# Patient Record
Sex: Male | Born: 1940 | Race: Black or African American | Hispanic: No | Marital: Single | Smoking: Former smoker
Health system: Southern US, Community
[De-identification: ages and names within clinical notes are randomized; demographics above are authoritative.]

## PROBLEM LIST (undated history)

## (undated) DIAGNOSIS — I10 Essential (primary) hypertension: Secondary | ICD-10-CM

## (undated) DIAGNOSIS — R64 Cachexia: Secondary | ICD-10-CM

## (undated) DIAGNOSIS — J9601 Acute respiratory failure with hypoxia: Secondary | ICD-10-CM

## (undated) DIAGNOSIS — C801 Malignant (primary) neoplasm, unspecified: Secondary | ICD-10-CM

## (undated) DIAGNOSIS — G809 Cerebral palsy, unspecified: Secondary | ICD-10-CM

## (undated) DIAGNOSIS — I739 Peripheral vascular disease, unspecified: Secondary | ICD-10-CM

## (undated) DIAGNOSIS — E785 Hyperlipidemia, unspecified: Secondary | ICD-10-CM

## (undated) DIAGNOSIS — K56609 Unspecified intestinal obstruction, unspecified as to partial versus complete obstruction: Secondary | ICD-10-CM

## (undated) DIAGNOSIS — J189 Pneumonia, unspecified organism: Secondary | ICD-10-CM

## (undated) DIAGNOSIS — J9600 Acute respiratory failure, unspecified whether with hypoxia or hypercapnia: Secondary | ICD-10-CM

## (undated) HISTORY — DX: Acute respiratory failure, unspecified whether with hypoxia or hypercapnia: J96.00

## (undated) HISTORY — DX: Cachexia: R64

## (undated) HISTORY — PX: PROSTATE SURGERY: SHX751

## (undated) HISTORY — DX: Essential (primary) hypertension: I10

## (undated) HISTORY — DX: Malignant (primary) neoplasm, unspecified: C80.1

## (undated) HISTORY — DX: Peripheral vascular disease, unspecified: I73.9

## (undated) HISTORY — DX: Cerebral palsy, unspecified: G80.9

## (undated) HISTORY — DX: Hyperlipidemia, unspecified: E78.5

---

## 1997-09-23 ENCOUNTER — Encounter: Admission: RE | Admit: 1997-09-23 | Discharge: 1997-09-23 | Payer: Self-pay | Admitting: Family Medicine

## 1997-10-27 ENCOUNTER — Encounter: Admission: RE | Admit: 1997-10-27 | Discharge: 1997-10-27 | Payer: Self-pay | Admitting: Family Medicine

## 1997-11-10 ENCOUNTER — Encounter: Admission: RE | Admit: 1997-11-10 | Discharge: 1997-11-10 | Payer: Self-pay | Admitting: Family Medicine

## 1998-01-21 ENCOUNTER — Encounter: Admission: RE | Admit: 1998-01-21 | Discharge: 1998-01-21 | Payer: Self-pay | Admitting: Family Medicine

## 1998-03-31 ENCOUNTER — Encounter: Admission: RE | Admit: 1998-03-31 | Discharge: 1998-03-31 | Payer: Self-pay | Admitting: Family Medicine

## 1998-05-10 ENCOUNTER — Encounter: Admission: RE | Admit: 1998-05-10 | Discharge: 1998-05-10 | Payer: Self-pay | Admitting: Sports Medicine

## 1998-10-11 ENCOUNTER — Encounter: Admission: RE | Admit: 1998-10-11 | Discharge: 1998-10-11 | Payer: Self-pay | Admitting: Family Medicine

## 1998-11-14 ENCOUNTER — Encounter: Admission: RE | Admit: 1998-11-14 | Discharge: 1998-11-14 | Payer: Self-pay | Admitting: Family Medicine

## 1998-12-16 ENCOUNTER — Encounter: Admission: RE | Admit: 1998-12-16 | Discharge: 1998-12-16 | Payer: Self-pay | Admitting: Family Medicine

## 1998-12-23 ENCOUNTER — Other Ambulatory Visit: Admission: RE | Admit: 1998-12-23 | Discharge: 1998-12-23 | Payer: Self-pay | Admitting: Urology

## 1998-12-30 ENCOUNTER — Encounter: Admission: RE | Admit: 1998-12-30 | Discharge: 1998-12-30 | Payer: Self-pay | Admitting: Family Medicine

## 1999-01-04 ENCOUNTER — Encounter: Admission: RE | Admit: 1999-01-04 | Discharge: 1999-01-04 | Payer: Self-pay | Admitting: Family Medicine

## 1999-03-29 ENCOUNTER — Encounter: Admission: RE | Admit: 1999-03-29 | Discharge: 1999-03-29 | Payer: Self-pay | Admitting: Family Medicine

## 2000-02-26 ENCOUNTER — Encounter: Admission: RE | Admit: 2000-02-26 | Discharge: 2000-02-26 | Payer: Self-pay | Admitting: Family Medicine

## 2000-06-04 ENCOUNTER — Encounter: Admission: RE | Admit: 2000-06-04 | Discharge: 2000-06-04 | Payer: Self-pay | Admitting: Family Medicine

## 2000-07-04 ENCOUNTER — Encounter: Admission: RE | Admit: 2000-07-04 | Discharge: 2000-07-04 | Payer: Self-pay | Admitting: Family Medicine

## 2000-07-29 ENCOUNTER — Encounter: Admission: RE | Admit: 2000-07-29 | Discharge: 2000-07-29 | Payer: Self-pay | Admitting: Family Medicine

## 2000-11-29 ENCOUNTER — Encounter: Admission: RE | Admit: 2000-11-29 | Discharge: 2000-11-29 | Payer: Self-pay | Admitting: Family Medicine

## 2000-12-06 ENCOUNTER — Encounter: Admission: RE | Admit: 2000-12-06 | Discharge: 2000-12-06 | Payer: Self-pay | Admitting: Family Medicine

## 2000-12-25 ENCOUNTER — Encounter: Admission: RE | Admit: 2000-12-25 | Discharge: 2000-12-25 | Payer: Self-pay | Admitting: Family Medicine

## 2001-07-29 ENCOUNTER — Encounter: Admission: RE | Admit: 2001-07-29 | Discharge: 2001-07-29 | Payer: Self-pay | Admitting: Sports Medicine

## 2001-08-26 ENCOUNTER — Encounter: Admission: RE | Admit: 2001-08-26 | Discharge: 2001-08-26 | Payer: Self-pay | Admitting: Sports Medicine

## 2001-08-28 ENCOUNTER — Encounter: Admission: RE | Admit: 2001-08-28 | Discharge: 2001-08-28 | Payer: Self-pay | Admitting: Family Medicine

## 2001-09-05 ENCOUNTER — Encounter: Admission: RE | Admit: 2001-09-05 | Discharge: 2001-09-05 | Payer: Self-pay | Admitting: Family Medicine

## 2002-01-13 ENCOUNTER — Encounter: Admission: RE | Admit: 2002-01-13 | Discharge: 2002-01-13 | Payer: Self-pay | Admitting: Family Medicine

## 2002-01-16 ENCOUNTER — Encounter: Admission: RE | Admit: 2002-01-16 | Discharge: 2002-01-16 | Payer: Self-pay | Admitting: Family Medicine

## 2002-04-20 ENCOUNTER — Encounter: Admission: RE | Admit: 2002-04-20 | Discharge: 2002-04-20 | Payer: Self-pay | Admitting: Family Medicine

## 2002-06-30 ENCOUNTER — Encounter: Admission: RE | Admit: 2002-06-30 | Discharge: 2002-06-30 | Payer: Self-pay | Admitting: Family Medicine

## 2002-07-31 ENCOUNTER — Encounter: Admission: RE | Admit: 2002-07-31 | Discharge: 2002-07-31 | Payer: Self-pay | Admitting: Family Medicine

## 2002-12-10 ENCOUNTER — Encounter: Admission: RE | Admit: 2002-12-10 | Discharge: 2002-12-10 | Payer: Self-pay | Admitting: Family Medicine

## 2003-01-29 ENCOUNTER — Encounter: Admission: RE | Admit: 2003-01-29 | Discharge: 2003-01-29 | Payer: Self-pay | Admitting: Family Medicine

## 2003-04-12 ENCOUNTER — Encounter: Admission: RE | Admit: 2003-04-12 | Discharge: 2003-04-12 | Payer: Self-pay | Admitting: Family Medicine

## 2003-09-02 ENCOUNTER — Ambulatory Visit (HOSPITAL_COMMUNITY): Admission: RE | Admit: 2003-09-02 | Discharge: 2003-09-02 | Payer: Self-pay | Admitting: Gastroenterology

## 2003-12-16 ENCOUNTER — Ambulatory Visit: Payer: Self-pay | Admitting: Sports Medicine

## 2004-01-17 ENCOUNTER — Ambulatory Visit: Payer: Self-pay | Admitting: Family Medicine

## 2004-05-25 ENCOUNTER — Ambulatory Visit: Payer: Self-pay | Admitting: Sports Medicine

## 2004-06-08 ENCOUNTER — Ambulatory Visit: Payer: Self-pay | Admitting: Sports Medicine

## 2004-06-19 ENCOUNTER — Encounter: Admission: RE | Admit: 2004-06-19 | Discharge: 2004-09-17 | Payer: Self-pay | Admitting: Family Medicine

## 2004-07-14 ENCOUNTER — Ambulatory Visit: Payer: Self-pay | Admitting: Family Medicine

## 2004-07-27 ENCOUNTER — Ambulatory Visit: Payer: Self-pay | Admitting: Family Medicine

## 2004-08-11 ENCOUNTER — Ambulatory Visit: Payer: Self-pay | Admitting: Family Medicine

## 2004-08-24 ENCOUNTER — Ambulatory Visit: Payer: Self-pay | Admitting: Sports Medicine

## 2004-09-06 ENCOUNTER — Ambulatory Visit: Payer: Self-pay | Admitting: Family Medicine

## 2004-10-11 ENCOUNTER — Ambulatory Visit: Payer: Self-pay | Admitting: Family Medicine

## 2004-11-09 ENCOUNTER — Encounter: Admission: RE | Admit: 2004-11-09 | Discharge: 2004-12-10 | Payer: Self-pay | Admitting: Family Medicine

## 2004-12-06 ENCOUNTER — Ambulatory Visit: Payer: Self-pay | Admitting: Family Medicine

## 2004-12-21 ENCOUNTER — Ambulatory Visit: Payer: Self-pay | Admitting: Family Medicine

## 2005-01-12 ENCOUNTER — Ambulatory Visit: Payer: Self-pay | Admitting: Family Medicine

## 2005-01-18 ENCOUNTER — Ambulatory Visit: Payer: Self-pay | Admitting: Family Medicine

## 2005-03-23 ENCOUNTER — Ambulatory Visit: Payer: Self-pay | Admitting: Family Medicine

## 2005-04-06 ENCOUNTER — Ambulatory Visit: Payer: Self-pay | Admitting: Family Medicine

## 2005-04-18 ENCOUNTER — Ambulatory Visit: Payer: Self-pay | Admitting: Family Medicine

## 2005-06-28 ENCOUNTER — Ambulatory Visit: Payer: Self-pay | Admitting: Family Medicine

## 2005-07-12 ENCOUNTER — Ambulatory Visit: Payer: Self-pay | Admitting: Family Medicine

## 2005-07-31 ENCOUNTER — Ambulatory Visit: Payer: Self-pay | Admitting: Sports Medicine

## 2005-10-02 ENCOUNTER — Ambulatory Visit: Payer: Self-pay | Admitting: Family Medicine

## 2005-10-04 ENCOUNTER — Ambulatory Visit: Admission: RE | Admit: 2005-10-04 | Discharge: 2005-10-04 | Payer: Self-pay | Admitting: Vascular Surgery

## 2005-10-04 ENCOUNTER — Encounter: Payer: Self-pay | Admitting: Vascular Surgery

## 2005-10-18 ENCOUNTER — Ambulatory Visit: Payer: Self-pay | Admitting: Family Medicine

## 2005-11-28 ENCOUNTER — Ambulatory Visit: Payer: Self-pay | Admitting: Sports Medicine

## 2006-01-28 ENCOUNTER — Ambulatory Visit: Payer: Self-pay | Admitting: Sports Medicine

## 2006-05-16 DIAGNOSIS — I152 Hypertension secondary to endocrine disorders: Secondary | ICD-10-CM | POA: Insufficient documentation

## 2006-05-16 DIAGNOSIS — E739 Lactose intolerance, unspecified: Secondary | ICD-10-CM

## 2006-05-16 DIAGNOSIS — N400 Enlarged prostate without lower urinary tract symptoms: Secondary | ICD-10-CM | POA: Insufficient documentation

## 2006-05-16 DIAGNOSIS — E1169 Type 2 diabetes mellitus with other specified complication: Secondary | ICD-10-CM | POA: Insufficient documentation

## 2006-05-16 DIAGNOSIS — E7800 Pure hypercholesterolemia, unspecified: Secondary | ICD-10-CM

## 2006-05-16 DIAGNOSIS — E876 Hypokalemia: Secondary | ICD-10-CM | POA: Insufficient documentation

## 2006-05-16 DIAGNOSIS — G809 Cerebral palsy, unspecified: Secondary | ICD-10-CM | POA: Insufficient documentation

## 2006-05-16 DIAGNOSIS — I10 Essential (primary) hypertension: Secondary | ICD-10-CM | POA: Insufficient documentation

## 2006-07-08 ENCOUNTER — Encounter: Payer: Self-pay | Admitting: Family Medicine

## 2006-07-08 ENCOUNTER — Ambulatory Visit: Payer: Self-pay | Admitting: Sports Medicine

## 2006-07-08 LAB — CONVERTED CEMR LAB
BUN: 17 mg/dL (ref 6–23)
Calcium: 10.4 mg/dL (ref 8.4–10.5)
Creatinine, Ser: 1.21 mg/dL (ref 0.40–1.50)

## 2007-01-02 ENCOUNTER — Encounter (INDEPENDENT_AMBULATORY_CARE_PROVIDER_SITE_OTHER): Payer: Self-pay | Admitting: Family Medicine

## 2007-01-02 ENCOUNTER — Ambulatory Visit: Payer: Self-pay | Admitting: Family Medicine

## 2007-01-02 DIAGNOSIS — R609 Edema, unspecified: Secondary | ICD-10-CM

## 2007-01-06 LAB — CONVERTED CEMR LAB
ALT: 32 units/L (ref 0–53)
AST: 25 units/L (ref 0–37)
Albumin: 4.4 g/dL (ref 3.5–5.2)
Alkaline Phosphatase: 44 units/L (ref 39–117)
BUN: 16 mg/dL (ref 6–23)
Calcium: 9.6 mg/dL (ref 8.4–10.5)
Chloride: 105 meq/L (ref 96–112)
HDL: 51 mg/dL (ref >39)
LDL Cholesterol: 76 mg/dL (ref 0–99)
PSA: 5.34 ng/mL — ABNORMAL HIGH (ref 0.10–4.00)
Potassium: 3.7 meq/L (ref 3.5–5.3)
Sodium: 141 meq/L (ref 135–145)
Total Protein: 7.3 g/dL (ref 6.0–8.3)

## 2007-01-10 ENCOUNTER — Encounter (INDEPENDENT_AMBULATORY_CARE_PROVIDER_SITE_OTHER): Payer: Self-pay | Admitting: Family Medicine

## 2007-01-15 ENCOUNTER — Telehealth (INDEPENDENT_AMBULATORY_CARE_PROVIDER_SITE_OTHER): Payer: Self-pay | Admitting: Family Medicine

## 2007-03-20 HISTORY — PX: PROSTATE SURGERY: SHX751

## 2007-05-02 ENCOUNTER — Ambulatory Visit: Payer: Self-pay | Admitting: Family Medicine

## 2007-05-02 DIAGNOSIS — D170 Benign lipomatous neoplasm of skin and subcutaneous tissue of head, face and neck: Secondary | ICD-10-CM

## 2007-07-24 LAB — CONVERTED CEMR LAB: PSA: 6.82 ng/mL

## 2007-07-29 ENCOUNTER — Encounter (INDEPENDENT_AMBULATORY_CARE_PROVIDER_SITE_OTHER): Payer: Self-pay | Admitting: Family Medicine

## 2007-08-28 ENCOUNTER — Encounter (INDEPENDENT_AMBULATORY_CARE_PROVIDER_SITE_OTHER): Payer: Self-pay | Admitting: Family Medicine

## 2007-09-16 ENCOUNTER — Ambulatory Visit: Admission: RE | Admit: 2007-09-16 | Discharge: 2007-12-15 | Payer: Self-pay | Admitting: Radiation Oncology

## 2007-09-17 ENCOUNTER — Encounter (INDEPENDENT_AMBULATORY_CARE_PROVIDER_SITE_OTHER): Payer: Self-pay | Admitting: Family Medicine

## 2007-09-18 DIAGNOSIS — C61 Malignant neoplasm of prostate: Secondary | ICD-10-CM | POA: Insufficient documentation

## 2007-09-18 HISTORY — DX: Malignant neoplasm of prostate: C61

## 2007-09-24 ENCOUNTER — Encounter: Admission: RE | Admit: 2007-09-24 | Discharge: 2007-09-24 | Payer: Self-pay | Admitting: Urology

## 2007-11-18 ENCOUNTER — Telehealth: Payer: Self-pay | Admitting: *Deleted

## 2007-11-20 ENCOUNTER — Ambulatory Visit (HOSPITAL_BASED_OUTPATIENT_CLINIC_OR_DEPARTMENT_OTHER): Admission: RE | Admit: 2007-11-20 | Discharge: 2007-11-20 | Payer: Self-pay | Admitting: Urology

## 2007-12-10 ENCOUNTER — Encounter (INDEPENDENT_AMBULATORY_CARE_PROVIDER_SITE_OTHER): Payer: Self-pay | Admitting: Family Medicine

## 2007-12-11 ENCOUNTER — Encounter (INDEPENDENT_AMBULATORY_CARE_PROVIDER_SITE_OTHER): Payer: Self-pay | Admitting: Family Medicine

## 2008-01-15 ENCOUNTER — Encounter (INDEPENDENT_AMBULATORY_CARE_PROVIDER_SITE_OTHER): Payer: Self-pay | Admitting: Family Medicine

## 2008-01-15 ENCOUNTER — Ambulatory Visit: Admission: RE | Admit: 2008-01-15 | Discharge: 2008-02-11 | Payer: Self-pay | Admitting: Radiation Oncology

## 2008-01-22 ENCOUNTER — Telehealth: Payer: Self-pay | Admitting: *Deleted

## 2008-02-17 ENCOUNTER — Encounter (INDEPENDENT_AMBULATORY_CARE_PROVIDER_SITE_OTHER): Payer: Self-pay | Admitting: Family Medicine

## 2008-02-17 ENCOUNTER — Ambulatory Visit: Payer: Self-pay

## 2008-02-17 LAB — CONVERTED CEMR LAB
ALT: 28 units/L (ref 0–53)
AST: 26 units/L (ref 0–37)
Albumin: 4.3 g/dL (ref 3.5–5.2)
Alkaline Phosphatase: 59 units/L (ref 39–117)
BUN: 11 mg/dL (ref 6–23)
HDL: 55 mg/dL (ref >39)
LDL Cholesterol: 106 mg/dL — ABNORMAL HIGH (ref 0–99)
Potassium: 3.4 meq/L — ABNORMAL LOW (ref 3.5–5.3)
Total CHOL/HDL Ratio: 3.4

## 2008-02-18 ENCOUNTER — Encounter (INDEPENDENT_AMBULATORY_CARE_PROVIDER_SITE_OTHER): Payer: Self-pay | Admitting: Family Medicine

## 2008-03-22 ENCOUNTER — Ambulatory Visit: Payer: Self-pay | Admitting: Family Medicine

## 2008-04-07 ENCOUNTER — Ambulatory Visit: Payer: Self-pay | Admitting: Family Medicine

## 2008-04-30 ENCOUNTER — Ambulatory Visit: Payer: Self-pay | Admitting: Family Medicine

## 2008-07-20 ENCOUNTER — Telehealth (INDEPENDENT_AMBULATORY_CARE_PROVIDER_SITE_OTHER): Payer: Self-pay | Admitting: Family Medicine

## 2008-08-06 ENCOUNTER — Ambulatory Visit: Payer: Self-pay | Admitting: Family Medicine

## 2008-08-23 ENCOUNTER — Ambulatory Visit: Payer: Self-pay | Admitting: Family Medicine

## 2008-09-13 ENCOUNTER — Encounter: Payer: Self-pay | Admitting: Family Medicine

## 2008-09-24 ENCOUNTER — Encounter: Payer: Self-pay | Admitting: Family Medicine

## 2008-11-26 ENCOUNTER — Ambulatory Visit: Payer: Self-pay | Admitting: Family Medicine

## 2008-12-13 ENCOUNTER — Ambulatory Visit: Payer: Self-pay | Admitting: Family Medicine

## 2009-01-31 ENCOUNTER — Ambulatory Visit: Payer: Self-pay | Admitting: Family Medicine

## 2009-05-09 ENCOUNTER — Ambulatory Visit: Payer: Self-pay | Admitting: Family Medicine

## 2009-05-09 ENCOUNTER — Encounter: Payer: Self-pay | Admitting: Family Medicine

## 2009-05-09 LAB — CONVERTED CEMR LAB
Calcium: 9.3 mg/dL (ref 8.4–10.5)
Potassium: 4 meq/L (ref 3.5–5.3)
Sodium: 139 meq/L (ref 135–145)

## 2009-05-24 ENCOUNTER — Encounter: Payer: Self-pay | Admitting: Family Medicine

## 2009-08-17 ENCOUNTER — Encounter: Payer: Self-pay | Admitting: Family Medicine

## 2009-08-17 ENCOUNTER — Ambulatory Visit: Payer: Self-pay | Admitting: Family Medicine

## 2010-01-27 ENCOUNTER — Ambulatory Visit: Payer: Self-pay | Admitting: Family Medicine

## 2010-04-11 ENCOUNTER — Ambulatory Visit: Admission: RE | Admit: 2010-04-11 | Discharge: 2010-04-11 | Payer: Self-pay | Source: Home / Self Care

## 2010-04-11 DIAGNOSIS — M2020 Hallux rigidus, unspecified foot: Secondary | ICD-10-CM | POA: Insufficient documentation

## 2010-04-11 DIAGNOSIS — H6120 Impacted cerumen, unspecified ear: Secondary | ICD-10-CM | POA: Insufficient documentation

## 2010-04-18 NOTE — Assessment & Plan Note (Signed)
Summary: f/up,tcb   Vital Signs:  Patient profile:   70 year old male Weight:      156.4 pounds Temp:     98.9 degrees F oral Pulse rate:   86 / minute BP sitting:   151 / 78  (left arm) Cuff size:   regular  Vitals Entered By: Garen Grams LPN (May 09, 2009 10:57 AM) CC: f/u BP, CP, and Lipoma Is Patient Diabetic? No Pain Assessment Patient in pain? no        Primary Care Provider:  Cyndia Bent MD  CC:  f/u BP, CP, and and Lipoma.  History of Present Illness: 1. Hypertension:  Pt is taking and tolerating his medicines as prescribed.  He doesn't check his blood pressure at home regularly.        ROS: denies any chest pain, shortness of breath  2. Cerebral Palsy:  hasn't been able to go back to PT because of his blood pressure.  His mobility is decreasing and he isn't able to get around as well as he used to.      ROS: denies any new focal numbness / weakness  3. Lipoma:  Pt has had a facial lipoma for years.  It has been getting bigger and is now more bothersome to the patient.  They tried draining it before and were unable to get anything out.  He has refused surgery before but thinks that it may be time now.      ROS: denies any fevers, redness, injury to the area.  Habits & Providers  Alcohol-Tobacco-Diet     Tobacco Status: never  Current Medications (verified): 1)  Norvasc 10 Mg  Tabs (Amlodipine Besylate) .... Take One Tablet By Mouth Daily 2)  Klor-Con M20 20 Meq  Tbcr (Potassium Chloride Crys Cr) .... Take 2 Tablets Daily 3)  Zocor 80 Mg  Tabs (Simvastatin) .... Take One Tablet By Mouth Daily For Cholesterol. 4)  Aspirin 81 Mg Tbec (Aspirin) .... Take One Tablet Daily 5)  Metoprolol Tartrate 100 Mg Tabs (Metoprolol Tartrate) .... Take Two Tablets Twice A Day For Blood Pressure 6)  Hydrochlorothiazide 25 Mg Tabs (Hydrochlorothiazide) .... Take One Tablet Daily 7)  Lisinopril 10 Mg Tabs (Lisinopril) .... Take 2 Tabs By Mouth Daily 8)  Wheelchair  Misc  (Misc. Devices) .... Wheelchair Use As Needed  Allergies: No Known Drug Allergies  Past History:  Past Medical History: Reviewed history from 12/13/2008 and no changes required. HTN Cerebral Palsy Hx. 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 No tobacco X 6 yrs, occ ETOH ( < 1 drinks/day).  Single.; Resistant to totally quitting alcohol.  Lives by himself.  Sisters Eulogio Ditch and Mical Kicklighter are involved family members [phone])Smoking Status:  never  Physical Exam  General:  vitals reviewed alert and well-hydrated.   Head:  Large lipoma (5x5cm) above the right eye brow.  It is firm and does not appear to have any fluid in it. Eyes:  vision grossly intact, pupils equal, pupils round, and pupils reactive to light.  fundi grossly normal Ears:  R ear normal and L ear normal.   Neck:  no JVD Lungs:  Normal respiratory effort, no wheezes or crackles Heart:  normal rate, regular rhythm, and no murmur.   Abdomen:  Bowel sounds positive,abdomen soft and non-tender without masses, organomegaly or hernias noted. Msk:  right arm  contracted and held in position.  Difficulty with ambulation.  Walks with a cane. Extremities:  no lower extremity edema Neurologic:  walks with a cane.  Speech difficult to understand. Psych:  normally interactive and good eye contact.     Impression & Recommendations:  Problem # 1:  HYPERTENSION, BENIGN SYSTEMIC (ICD-401.1) Assessment Improved Will increase Lisinopril. Check BMET today. His updated medication list for this problem includes:    Norvasc 10 Mg Tabs (Amlodipine besylate) .[name]... Take one tablet by mouth daily    Metoprolol Tartrate 100 Mg Tabs (Metoprolol tartrate) .[name]... Take two tablets twice a day for blood pressure    Hydrochlorothiazide 25 Mg Tabs (Hydrochlorothiazide) .[name]... Take one tablet daily     Lisinopril 10 Mg Tabs (Lisinopril) .[name]... 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  Unable to ambulate as well.  Has been falling.  Will write prescription for a wheel chair.  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 Orders: Dermatology Referral (Derma) [facility]- Est  Level 4 ([address])  Complete Medication List: 1)  Norvasc 10 Mg Tabs (Amlodipine besylate) .... Take one tablet by mouth daily 2)  Klor-con M20 20 Meq Tbcr (Potassium chloride crys cr) .... Take 2 tablets daily 3)  Zocor 80 Mg Tabs (Simvastatin) .... Take one tablet by mouth daily for cholesterol. 4)  Aspirin 81 Mg Tbec (Aspirin) .... Take one tablet daily 5)  Metoprolol Tartrate 100 Mg Tabs (Metoprolol tartrate) .... Take two tablets twice a day for blood pressure 6)  Hydrochlorothiazide 25 Mg Tabs (Hydrochlorothiazide) .... Take one tablet daily 7)  Lisinopril 10 Mg Tabs (Lisinopril) .... Take 2 tabs by mouth daily 8)  Wheelchair Misc (Misc. devices) .... Wheelchair use as needed  Patient Instructions: 1)  For your blood pressure start taking two of the Lisinopril tabs a day for a total of 20mg . 2)  I will send in a prescription for a wheel chair, let me know if you have any problems getting it. 3)  I have put in a referral to a Dermatologic Surgeon for them to take a look at the Lipoma and try and remove it. 4)  Please schedule a follow up appoinment in 8 weeks Prescriptions: WHEELCHAIR  MISC (MISC. DEVICES) Wheelchair use as needed  #1 x 0   Entered and Authorized by:   Angelena Sole MD   Signed by:   Angelena Sole MD on 05/09/2009   Method used:   Electronically to        [facility] (retail)       [address]       [place], [place]  [address]       Ph:  [phone]       Fax: [phone]   RxID:   [id number] LISINOPRIL 10 MG TABS (LISINOPRIL) Take 2 tabs by mouth daily  #60 x 6   Entered and Authorized by:   Angelena Sole MD   Signed by:   Angelena Sole MD on 05/09/2009   Method used:   Electronically to        [facility] (retail)       [address]       [place]  [place], [place]  [address]       Ph: [phone]       Fax: [phone]   RxID:   [id number]   Prevention & Chronic Care Immunizations   Influenza vaccine: Fluvax 3+  (01/02/2007)   Influenza vaccine due: 01/02/2008    Tetanus booster: 10/18/1998: Done.   Tetanus booster due: 10/17/2008    Pneumococcal vaccine: Done.  (10/17/1997)   Pneumococcal vaccine due: None    H. zoster vaccine: Not documented  Colorectal Screening   Hemoccult: Done.  (06/22/2004)   Hemoccult due: Not Indicated    Colonoscopy: normal  (09/24/2008)   Colonoscopy due: 09/24/2013  Other Screening   PSA: 6.82 (then prostate cancer treatment)  (07/24/2007)   PSA due due: 07/23/2008   Smoking status: never  (05/09/2009)  Lipids   Total Cholesterol: 188  (02/17/2008)   LDL: 106  (02/17/2008)   LDL Direct: Not documented   HDL: 55  (02/17/2008)   Triglycerides: 136  (02/17/2008)    SGOT (AST): 26  (02/17/2008)   SGPT (ALT): 28  (02/17/2008)   Alkaline phosphatase: 59  (02/17/2008)   Total bilirubin: 1.5  (02/17/2008)  Hypertension   Last Blood Pressure: 151 / 78  (05/09/2009)   Serum creatinine: 0.85  (02/17/2008)   Serum potassium 3.4  (02/17/2008)    Hypertension flowsheet reviewed?: Yes   Progress toward BP goal: Improved  Self-Management Support :   Personal Goals (by the next clinic visit) :      Personal blood pressure goal: 140/90  (11/26/2008)     Personal LDL goal: 130  (11/26/2008)    Hypertension self-management support: Not documented    Lipid self-management support: Not documented

## 2010-04-18 NOTE — Assessment & Plan Note (Signed)
Summary: f/u visit/[facility](resch'd from 11/3)[facility]   Vital Signs:  Patient profile:   70 year old male Weight:      158.4 pounds Temp:     98.8 degrees F oral Pulse rate:   91 / minute BP sitting:   154 / 93  (left arm) Cuff size:   regular  Vitals Entered By: Garen Grams LPN (January 27, 2010 2:01 PM) CC: f/u BP Is Patient Diabetic? No Pain Assessment Patient in pain? no        Primary Care Provider:  Cyndia Bent MD  CC:  f/u BP.  History of Present Illness: -Rick Schwartz is a pleasant 70 yo AA male with hx significant for cerebral palsy, htn, dyslipidemia and facial lipoma.    1. HTN: He returned to clinic today with one of his sisters who helps care for him.  Currently his bp is still not at goal, 154/93 today.  Pt reports taking all bp meds.  He doesn't check his blood pressure at home regularly.  2. Lipoma:  Was seen by [facility]. forehead lipoma is stable in size.  Still not interested in surgery.  3. Hyperlipidemia:  He has not been taking his 'cholesterol medicine' for the past 2 weeks.  This is not because of noncompliance but due to miscommunication between his sisters who look after him.   4. Hypokalemia:  Not taking his K+ because he ran out.  ROS: Denies chest pain, shortness of breath.  Endorses some lower extremity swelling.    Habits & Providers  Alcohol-Tobacco-Diet     Tobacco Status: never  Current Medications (verified): 1)  Norvasc 10 Mg  Tabs (Amlodipine Besylate) .... Take One Tablet By Mouth Daily 2)  Klor-Con M20 20 Meq  Tbcr (Potassium Chloride Crys Cr) .... Take 2 Tablets Daily 3)  Zocor 80 Mg  Tabs (Simvastatin) .... Take One Tablet By Mouth Daily For Cholesterol. 4)  Aspirin 81 Mg Tbec (Aspirin) .... Take One Tablet Daily 5)  Metoprolol Tartrate 100 Mg Tabs (Metoprolol Tartrate) .... Take Two Tablets Twice A Day For Blood Pressure 6)  Hydrochlorothiazide 25 Mg Tabs (Hydrochlorothiazide) .... Take One Tablet Daily 7)  Lisinopril  20 Mg Tabs (Lisinopril) .... Take 1 Tab By Mouth Daily 8)  Wheelchair  Misc (Misc. Devices) .... Wheelchair Use As Needed  Allergies: No Known Drug Allergies  Social History: Reviewed history from 11/26/2008 and no changes required. No tobacco X 6 yrs, occ ETOH ( < 1 drinks/day).  Single.; Resistant to totally quitting alcohol.  Lives by himself.  Sisters Rick Schwartz and Rick Schwartz are involved family members [phone])  Physical Exam  General:  vitals reviewed, no acute distress, alert and well-hydrated.   Eyes:  vision grossly intact, pupils equal, pupils round, and pupils reactive to light.  fundi grossly normal Neck:  no JVD Lungs:  Normal respiratory effort, no wheezes or crackles Heart:  normal rate, regular rhythm, and no murmur.   Extremities:  1+ LE swelling L>R Neurologic:  walks with a cane.  Speech difficult to understand. Skin:  turgor normal, color normal, and no rashes.   Psych:  not anxious appearing and not depressed appearing.     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  Pt not interested in increasing medicines at this point.  Will consider increasing dose to 40mg  at next visit if still elevated.  Pt may also benefit from a diurectic because of the LE swelling.  His updated medication list for this problem includes:    Norvasc 10 Mg Tabs (Amlodipine besylate) .[name]... Take one tablet by mouth daily    Metoprolol Tartrate 100 Mg Tabs (Metoprolol tartrate) .[name]... Take two tablets twice a day for blood pressure    Hydrochlorothiazide 25 Mg Tabs (Hydrochlorothiazide) .[name]... Take one tablet daily    Lisinopril 20 Mg Tabs (Lisinopril) .[name]... Take 1 tab by mouth daily  Orders: [facility]- Est  Level 4 ([address])  Problem # 2:  HYPERCHOLESTEROLEMIA (ICD-272.0) Assessment: Unchanged  Encouraged to fill med and take as prescribed.  No changes.  His updated medication list for this problem  includes:    Zocor 80 Mg Tabs (Simvastatin) .[name]... Take one tablet by mouth daily for cholesterol.  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  Orders: [facility]- Est  Level 4 ([address])  Problem # 4:  HYPOKALEMIA (ICD-276.8) Assessment: Unchanged  Stable at last check.  Will recheck at next visit.  Orders: [facility]- Est  Level 4 ([address])  Complete Medication List: 1)  Norvasc 10 Mg Tabs (Amlodipine besylate) .... Take one tablet by mouth daily 2)  Klor-con M20 20 Meq Tbcr (Potassium chloride crys cr) .... Take 2 tablets daily 3)  Zocor 80 Mg Tabs (Simvastatin) .... Take one tablet by mouth daily for cholesterol. 4)  Aspirin 81 Mg Tbec (Aspirin) .... Take one tablet daily 5)  Metoprolol Tartrate 100 Mg Tabs (Metoprolol tartrate) .... Take two tablets twice a day for blood pressure 6)  Hydrochlorothiazide 25 Mg Tabs (Hydrochlorothiazide) .... Take one tablet daily 7)  Lisinopril 20 Mg Tabs (Lisinopril) .... Take 1 tab by mouth daily 8)  Wheelchair Misc (Misc. devices) .... Wheelchair use as needed  Patient Instructions: 1)  1. It was nice to see you today. 2)  2. Today you received a flu shot. 3)  3. We will follow up on your blood pressure in 3 months.  If if has not improved will consider increasing your medication and / or adding a fluid pill.  This may help with your leg swelling, as well. 4)  4. Remember to fill your Simvastatin and Potassium medications and take as directed. 5)  5. Make an appointment for 3 months.   Orders Added: 1)  [facility]- Est  Level 4 [[address]]  Appended Document: f/u visit/bmc(resch'd from 11/3)[facility]   Influenza Vaccine    Vaccine Type: Fluvax 3+    Site: left deltoid    Mfr: GlaxoSmithKline    Dose: 0.5 ml    Route: IM    Given by: Garen Grams LPN    Exp. Date: 09/13/2010    Lot #: [id number]    VIS given: 10/11/09 version given January 27, 2010.  Flu Vaccine Consent  Questions    Do you have a history of severe allergic reactions to this vaccine? no    Any prior history of allergic reactions to egg and/or gelatin? no    Do you have a sensitivity to the preservative Thimersol? no    Do you have a past history of Guillan-Barre Syndrome? no    Do you currently have an acute febrile illness? no    Have you ever had a severe reaction to latex? no    Vaccine information given and explained to patient? yes

## 2010-04-18 NOTE — Letter (Signed)
Summary: Generic Letter  [name]  [address]   [place], [place] [address]   Phone: [phone]  Fax: [phone]    08/17/2009  CHARLOTTE BRAFFORD [address] [place], [place]  [address]  To Whom it May Concern,  Mr. Vrishank Moster is a patient of mine.  He has cerebral palsy and has limited mobility and use of his extremities.  He has been getting Home Health Services 2-3 hours per day to help with activities of daily living.  I believe that he will need to continue to receive these services for 2-3 hours per day.  If you have any questions please call the office.  Sincerely,   Angelena Sole MD

## 2010-04-18 NOTE — Consult Note (Signed)
Summary: [facility]  [facility]   Imported By: Bradly Bienenstock 05/30/2009 16:31:12  _____________________________________________________________________  External Attachment:    Type:   Image     Comment:   External Document

## 2010-04-18 NOTE — Assessment & Plan Note (Signed)
Summary: f/u,df   Vital Signs:  Patient profile:   70 year old male Weight:      145.3 pounds Temp:     98.1 degrees F oral Pulse rate:   78 / minute BP sitting:   166 / 89  (left arm) Cuff size:   regular  Vitals Entered By: Garen Grams LPN (August 17, 1608 10:51 AM) CC: f/u HTN, cerebral palsy, lipoma Is Patient Diabetic? No Pain Assessment Patient in pain? no        Primary Care Provider:  Cyndia Bent MD  CC:  f/u HTN, cerebral palsy, and lipoma.  History of Present Illness: 1. HTN:  Pt is usually taking the medicines that he has been prescribed.  He has been out of the Lisinopril for about 1 week and didn't tell anyone to refill it.  He has been taking the Metoprolol and HCTZ.  He doesn't check his blod pressure at home regularly.      ROS: denies chest pain, shortness of breath, vision changes, or headache  2. Cerebral palsy:  Unchanged.  They did go get a wheelchair but haven't been using it.  He is still mobile with a cane.  He needs Home Health Services to help with ADLs.  3. Lipoma:  seen by Dermatologist who was concerned that there was a blood vessel growing in the lipoma and that the surgery would be complicated and be high risk and that it would only be a cosmetic procedure.      ROS: denies pain, fevers, discharge, or bleeding  Habits & Providers  Alcohol-Tobacco-Diet     Tobacco Status: never  Current Medications (verified): 1)  Norvasc 10 Mg  Tabs (Amlodipine Besylate) .... Take One Tablet By Mouth Daily 2)  Klor-Con M20 20 Meq  Tbcr (Potassium Chloride Crys Cr) .... Take 2 Tablets Daily 3)  Zocor 80 Mg  Tabs (Simvastatin) .... Take One Tablet By Mouth Daily For Cholesterol. 4)  Aspirin 81 Mg Tbec (Aspirin) .... Take One Tablet Daily 5)  Metoprolol Tartrate 100 Mg Tabs (Metoprolol Tartrate) .... Take Two Tablets Twice A Day For Blood Pressure 6)  Hydrochlorothiazide 25 Mg Tabs (Hydrochlorothiazide) .... Take One Tablet Daily 7)  Lisinopril 20 Mg Tabs  (Lisinopril) .... Take 1 Tab By Mouth Daily 8)  Wheelchair  Misc (Misc. Devices) .... Wheelchair Use As Needed  Allergies: No Known Drug Allergies  Past History:  Past Medical History: Reviewed history from 12/13/2008 and no changes required. HTN Cerebral Palsy Hx. 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 No tobacco X 6 yrs, occ ETOH ( < 1 drinks/day).  Single.; Resistant to totally quitting alcohol.  Lives by himself.  Sisters Eulogio Ditch and Faheem Ziemann are involved family members [phone])  Physical Exam  General:  vitals reviewed, no acute distress, alert and well-hydrated.   Neck:  no JVD Lungs:  Normal respiratory effort, no wheezes or crackles Heart:  normal rate, regular rhythm, and no murmur.   Abdomen:  Bowel sounds positive,abdomen soft and non-tender without masses, organomegaly or hernias noted. Msk:  right arm contracted and held in position.  Difficulty with ambulation.  Walks with a cane. Extremities:  no lower extremity edema Neurologic:  walks with a cane.  Speech difficult to understand. Psych:  normally interactive and good eye contact.     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  Recheck in 6-8 weeks. His updated medication list for this problem includes:    Norvasc 10 Mg Tabs (Amlodipine besylate) .[name]... Take one tablet by mouth daily    Metoprolol Tartrate 100 Mg Tabs (Metoprolol tartrate) .[name]... Take two tablets twice a day for blood pressure    Hydrochlorothiazide 25 Mg Tabs (Hydrochlorothiazide) .[name]... Take one tablet daily    Lisinopril 20 Mg Tabs (Lisinopril) .[name]... 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  Orders: [facility]- Est  Level 4 ([address])  Complete Medication List: 1)  Norvasc 10 Mg Tabs (Amlodipine besylate) .... Take one tablet by mouth daily 2)  Klor-con M20 20 Meq Tbcr (Potassium chloride crys cr) .... Take 2 tablets daily 3)  Zocor 80 Mg Tabs (Simvastatin) .... Take one tablet by mouth daily for cholesterol. 4)  Aspirin 81 Mg Tbec (Aspirin) .... Take one tablet daily 5)  Metoprolol Tartrate 100 Mg Tabs (Metoprolol tartrate) .... Take two tablets twice a day for blood pressure 6)  Hydrochlorothiazide 25 Mg Tabs (Hydrochlorothiazide) .... Take one tablet daily 7)  Lisinopril 20 Mg Tabs (Lisinopril) .... Take 1 tab by mouth daily 8)  Wheelchair Misc (Misc. devices) .... Wheelchair use as needed  Patient Instructions: 1)  I would like for him to start taking the Lisinopril in the new dose. 2)  Otherwise I think that he is doing well 3)  Please schedule an appointment in 6-8 weeks to recheck his blood pressure Prescriptions: LISINOPRIL 20 MG TABS (LISINOPRIL) Take 1 tab by mouth daily  #30 x 6   Entered and Authorized by:   Angelena Sole MD   Signed by:   Angelena Sole MD on 08/17/2009   Method used:   Electronically to        [facility] (retail)       [address]       [place], [place]  [address]       Ph: [phone]       Fax: [phone]   RxID:   [id number]

## 2010-04-20 NOTE — Assessment & Plan Note (Signed)
Summary: F/U VISIT/[facility]   Vital Signs:  Patient profile:   70 year old male Weight:      160.5 pounds Temp:     97.9 degrees F oral Pulse rate:   97 / minute BP sitting:   160 / 89  (right arm) Cuff size:   regular  Vitals Entered By: Garen Grams LPN (April 11, 2010 1:54 PM) CC: f/u bp Is Patient Diabetic? No Pain Assessment Patient in pain? no        Primary Care Provider:  Cyndia Bent MD  CC:  f/u bp.  History of Present Illness: 1. HTN:  Pt is taking his blood pressure medications as directed.  He does check his blood pressure at home occassionally.  It is around 140/90.  ROS: denies chest pain, shortness of breath  2. HLD: Pt is taking the Simvastatin as prescribed.    ROS: denies muscle aches  3. LE edema:  Still having some LE edema.  Left worse than right.  Not really bothersome to patient.  4. Hearing problems: sister thinks that his ears are clogged up because he is not listening as well as he used to  5. Toe problems:  the toes on his right foot are bunched together  Habits & Providers  Alcohol-Tobacco-Diet     Tobacco Status: never  Current Medications (verified): 1)  Norvasc 10 Mg  Tabs (Amlodipine Besylate) .... Take One Tablet By Mouth Daily 2)  Klor-Con M20 20 Meq  Tbcr (Potassium Chloride Crys Cr) .... Take 2 Tablets Daily 3)  Zocor 80 Mg  Tabs (Simvastatin) .... Take One Tablet By Mouth Daily For Cholesterol. 4)  Aspirin 81 Mg Tbec (Aspirin) .... Take One Tablet Daily 5)  Metoprolol Tartrate 100 Mg Tabs (Metoprolol Tartrate) .... Take Two Tablets Twice A Day For Blood Pressure 6)  Lisinopril-Hydrochlorothiazide 20-25 Mg Tabs (Lisinopril-Hydrochlorothiazide) .... 2 Tabs By Mouth Daily For Blood Pressure 7)  Wheelchair  Misc (Misc. Devices) .... Wheelchair Use As Needed  Allergies: No Known Drug Allergies  Past History:  Past Medical History: Reviewed history from 12/13/2008 and no changes required. HTN Cerebral Palsy Hx. 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 No tobacco X 6 yrs, occ ETOH ( < 1 drinks/day).  Single.; Resistant to totally quitting alcohol.  Lives by himself.  Sisters Eulogio Ditch and Oden Lindaman are involved family members [phone])  Physical Exam  General:  vitals reviewed, no acute distress, alert and well-hydrated.   Head:  Large lipoma (5x5cm) above the right eye brow.  It is firm and does not appear to have any fluid in it. Ears:  bilateral cerumen impaction Mouth:  teeth missing.   Neck:  no JVD Lungs:  Normal respiratory effort, no wheezes or crackles Heart:  normal rate, regular rhythm, and no murmur.   Msk:  right arm contracted and held in position.  Difficulty with ambulation.  Walks with a cane.  Right foot:  big toe with limited ROM.  Soft skin between 1st and 2nd toe.  No ulcers. Extremities:  1+ LE swelling L>R Neurologic:  walks with a cane.  Speech difficult to understand. Psych:  not anxious appearing and not depressed appearing.     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  Will combine Lisinopril / HCTZ and increase dose.  Recheck in 6-8 weeks with BM[facility]. The following medications were removed from the medication list:    Hydrochlorothiazide 25 Mg Tabs (Hydrochlorothiazide) .[name]... Take one tablet daily His updated medication list for this problem includes:    Norvasc 10 Mg Tabs (Amlodipine besylate) .[name]... Take one tablet by mouth daily    Metoprolol Tartrate 100 Mg Tabs (Metoprolol tartrate) .[name]... Take two tablets twice a day for blood pressure    Lisinopril-hydrochlorothiazide 20-25 Mg Tabs (Lisinopril-hydrochlorothiazide) .[name]... 2 tabs by mouth daily for blood pressure  Orders: [facility]- Est  Level 4 ([address])  Problem # 2:  LEG EDEMA  (ICD-782.3) Assessment: New  Seems like dependent edema.  Will increase HCTZ to see if that helps.  Would likely benefit from compression stockings. The following medications were removed from the medication list:    Hydrochlorothiazide 25 Mg Tabs (Hydrochlorothiazide) .[name]... Take one tablet daily His updated medication list for this problem includes:    Lisinopril-hydrochlorothiazide 20-25 Mg Tabs (Lisinopril-hydrochlorothiazide) .[name]... 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  Advised some barrier, like cotton ball between toes.  Orders: [facility]- Est  Level 4 ([address])  Complete Medication List: 1)  Norvasc 10 Mg Tabs (Amlodipine besylate) .... Take one tablet by mouth daily 2)  Klor-con M20 20 Meq Tbcr (Potassium chloride crys cr) .... Take 2 tablets daily 3)  Zocor 80 Mg Tabs (Simvastatin) .... Take one tablet by mouth daily for cholesterol. 4)  Aspirin 81 Mg Tbec (Aspirin) .... Take one tablet daily 5)  Metoprolol Tartrate 100 Mg Tabs (Metoprolol tartrate) .... Take two tablets twice a day for blood pressure 6)  Lisinopril-hydrochlorothiazide 20-25 Mg Tabs (Lisinopril-hydrochlorothiazide) .... 2 tabs by mouth daily for blood pressure 7)  Wheelchair Misc (Misc. devices) .... Wheelchair use as needed  Patient Instructions: 1)  We are going to adjust your medications a bit today 2)  We are going to combine the Lisinopril and HCTZ. 3)  Take two pills in the morning 4)  Please schedule a follow up appointment in 6-8 weeks 5)  For your toes, you can put a piece of cotton between the toes to help keep them from rubbing together Prescriptions: LISINOPRIL-HYDROCHLOROTHIAZIDE 20-25 MG TABS  (LISINOPRIL-HYDROCHLOROTHIAZIDE) 2 tabs by mouth daily for blood pressure  #60 x 3   Entered and Authorized by:   Angelena Sole MD   Signed by:   Angelena Sole MD on 04/11/2010   Method used:   Electronically to        [facility] (retail)       [address]       [place], [place]  [address]       Ph: [phone]       Fax: [phone]   RxID:   [id number]    Orders Added: 1)  [facility]- Est  Level 4 [[address]]

## 2010-05-31 ENCOUNTER — Encounter: Payer: Self-pay | Admitting: Family Medicine

## 2010-05-31 ENCOUNTER — Ambulatory Visit (INDEPENDENT_AMBULATORY_CARE_PROVIDER_SITE_OTHER): Payer: Medicare Other | Admitting: Family Medicine

## 2010-05-31 DIAGNOSIS — I10 Essential (primary) hypertension: Secondary | ICD-10-CM

## 2010-05-31 NOTE — Progress Notes (Signed)
  Subjective:    Patient ID: Rick Schwartz, male    DOB: February 07, 1941, 70 y.o.   MRN: [id number]  Hypertension This is a chronic problem. The current episode started more than 1 year ago. The problem is unchanged. The problem is controlled. Pertinent negatives include no anxiety, blurred vision, chest pain, headaches, orthopnea, peripheral edema or shortness of breath. There are no associated agents to hypertension. Risk factors for coronary artery disease include sedentary lifestyle. Past treatments include ACE inhibitors, calcium channel blockers and diuretics. There are no compliance problems.       Review of Systems  Eyes: Negative for blurred vision.  Respiratory: Negative for shortness of breath.   Cardiovascular: Negative for chest pain and orthopnea.  Neurological: Negative for headaches.       Objective:   Physical Exam  Constitutional:       Cerebral palsy  HENT:       Large lipoma on right forehead  Neck: Normal range of motion. Neck supple.  Cardiovascular: Normal rate, regular rhythm and normal heart sounds.   Pulmonary/Chest: Effort normal and breath sounds normal. No respiratory distress. He has no wheezes.  Abdominal: Soft. He exhibits no distension.  Musculoskeletal: Normal range of motion. He exhibits no edema.  Skin: Skin is warm and dry. No erythema.          Assessment & Plan:

## 2010-05-31 NOTE — Assessment & Plan Note (Signed)
BP improved today.  At goal.  No medication changes today.

## 2010-05-31 NOTE — Patient Instructions (Signed)
You are doing well.  We will not make any changes today Please follow up in 3months

## 2010-08-01 NOTE — Op Note (Signed)
NAME:  Rick Schwartz, Rick Schwartz NO.:  [id number]   MEDICAL RECORD NO.:  [id number]          PATIENT TYPE:  AMB   LOCATION:  [facility]                         FACILITY:  [facility]   PHYSICIAN:  Valetta Fuller, M.D.  DATE OF BIRTH:  1941-02-17   DATE OF PROCEDURE:  11/20/2007  DATE OF DISCHARGE:  11/20/2007                               OPERATIVE REPORT   PREOPERATIVE DIAGNOSIS:  Adenocarcinoma of the prostate, clinical stage  T1c, intermediate risk.   POSTOPERATIVE DIAGNOSIS:  Adenocarcinoma of the prostate, clinical stage  T1c, intermediate risk.   PROCEDURE PERFORMED:  I-125 prostatic seed implantation via a Dentist.   SURGEON:  Valetta Fuller, M.D.   ASSISTANT:  Maryln Gottron, M.D.   ANESTHESIA:  General.   INDICATIONS:  Rick Schwartz is a 70 year old male.  He was diagnosed by  myself with intermediate risk, clinical stage T1c adenocarcinoma of the  prostate.  The patient's PSA was minimally elevated at 5.3.  The patient  underwent an ultrasound which revealed a 30 grams prostate.  Right-sided  biopsies were negative.  On the left side, he had one core positive for  Gleason 7 tumor involving 5% of the material.  He also had a positive  core for Gleason 6 cancer involving 10% of material.  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  He is felt to be a low to intermediate risk.  The patient  underwent extensive treatment options.  He had counseling by myself and  Dr. Dayton Scrape and elected to have interstitial seed implantation.  He  appeared to understand the advantages, disadvantages, potential issues  with regard to this treatment option.   TECHNIQUE AND FINDINGS:  The patient was brought to the operating room  where he had successful induction of general anesthetic.  Compression  boots were placed.  The patient received preoperative ciprofloxacin.  He  was placed a mid lithotomy position.  A  Foley catheter with contrast in  the balloon was placed.  Transrectal ultrasound probe was placed in the  rectum and anchored to a floor stand.  Anchoring needles were placed in  the prostate.  Dr. Dayton Scrape in the radiation oncology team did real-time  contouring of the rectum, urethra and prostate.  A real-time dosimetry  plan was then established.  We then came in the operating room for  placement of the needles.   Each needle pass was done with real-time sagittal ultrasound guidance.  A total of 28 needles were placed with a number of activated seeds at  87.  All seeds were actually implanted by the [employer].  At the completion of the procedure fluoroscopic imaging  revealed nice distribution of the seeds.  The Foley catheter was  removed and flexible cystoscopy was performed.  No seeds were noted to  be in the prostatic urethra or bladder.  A new Foley catheter was  inserted.  The patient appeared to tolerate the procedure well.  There  were no obvious complications or difficulties.  Valetta Fuller, M.D.  Electronically Signed     DSG/MEDQ  D:  12/03/2007  T:  12/04/2007  Job:  [id number]

## 2010-08-04 NOTE — Op Note (Signed)
NAME:  Rick Schwartz, Rick Schwartz                          ACCOUNT NO.:  [id number]   MEDICAL RECORD NO.:  [id number]                   PATIENT TYPE:  AMB   LOCATION:  ENDO                                 FACILITY:  [facility]   PHYSICIAN:  James L. Malon Kindle., M.D.          DATE OF BIRTH:  1941/03/14   DATE OF PROCEDURE:  09/02/2003  DATE OF DISCHARGE:                                 OPERATIVE REPORT   PROCEDURE:  Colonoscopy.   MEDICATIONS:  1. Fentanyl 75 mcg.  2. Versed 7 mg IV.   SCOPE:  Olympus pediatric colonoscope.   INDICATION:  Previous history of polyps.  This is done as a follow up.   DESCRIPTION OF PROCEDURE:  The procedure had been explained to the patient  and consent obtained.  The patient in left lateral decubitus position, the  Olympus scope was inserted and advanced.  The prep was excellent.  We were  able to reach the cecum without difficulty.  The ileocecal valve and  appendiceal orifice seen.  The scope was withdrawn, and the cecum, ascending  colon, transverse colon, splenic flexure, descending and sigmoid colon were  seen well upon removal.  No polyps or other lesions were seen.  The scope  was withdrawn, and the rectum was free of polyps.  The patient tolerated the  procedure well.   ASSESSMENT:  History of colon polyps with negative colonoscopy.  V12.72/   PLAN:  We will recommend repeating in 5 years with regular Hemoccults.                                               James L. Malon Kindle., M.D.    Waldron Session  D:  09/02/2003  T:  09/02/2003  Job:  [id number]   cc:   [facility]

## 2010-09-04 ENCOUNTER — Emergency Department (HOSPITAL_COMMUNITY)
Admission: EM | Admit: 2010-09-04 | Discharge: 2010-09-04 | Disposition: A | Discharge status: Home / Self Care | Payer: PRIVATE HEALTH INSURANCE | Attending: Emergency Medicine | Admitting: Emergency Medicine

## 2010-09-04 ENCOUNTER — Emergency Department (HOSPITAL_COMMUNITY): Payer: PRIVATE HEALTH INSURANCE

## 2010-09-04 DIAGNOSIS — Y92009 Unspecified place in unspecified non-institutional (private) residence as the place of occurrence of the external cause: Secondary | ICD-10-CM | POA: Insufficient documentation

## 2010-09-04 DIAGNOSIS — I10 Essential (primary) hypertension: Secondary | ICD-10-CM | POA: Insufficient documentation

## 2010-09-04 DIAGNOSIS — E785 Hyperlipidemia, unspecified: Secondary | ICD-10-CM | POA: Insufficient documentation

## 2010-09-04 DIAGNOSIS — S0180XA Unspecified open wound of other part of head, initial encounter: Secondary | ICD-10-CM | POA: Insufficient documentation

## 2010-09-04 DIAGNOSIS — W010XXA Fall on same level from slipping, tripping and stumbling without subsequent striking against object, initial encounter: Secondary | ICD-10-CM | POA: Insufficient documentation

## 2010-09-04 DIAGNOSIS — G809 Cerebral palsy, unspecified: Secondary | ICD-10-CM | POA: Insufficient documentation

## 2010-09-04 DIAGNOSIS — Z23 Encounter for immunization: Secondary | ICD-10-CM | POA: Insufficient documentation

## 2010-09-04 DIAGNOSIS — R220 Localized swelling, mass and lump, head: Secondary | ICD-10-CM | POA: Insufficient documentation

## 2010-09-04 DIAGNOSIS — R221 Localized swelling, mass and lump, neck: Secondary | ICD-10-CM | POA: Insufficient documentation

## 2010-09-07 ENCOUNTER — Ambulatory Visit (INDEPENDENT_AMBULATORY_CARE_PROVIDER_SITE_OTHER): Payer: Medicare Other | Admitting: *Deleted

## 2010-09-07 DIAGNOSIS — Z5189 Encounter for other specified aftercare: Secondary | ICD-10-CM

## 2010-09-07 NOTE — Progress Notes (Signed)
Patient had laceration to left forehead on 09/04/2010. Sutures were placed late on that evening after 11:00 PM. Dr. Sheffield Slider looked at wound and advises that since it has not been even three days yet needs to wait . Patient has an appointment scheduled 09/12/2010 and will remove sutures at that appointment. Dr. Sheffield Slider advises this is OK .  wound appears to be healing well. No redness or signs of infection.

## 2010-09-12 ENCOUNTER — Encounter: Payer: Self-pay | Admitting: Family Medicine

## 2010-09-12 ENCOUNTER — Ambulatory Visit (INDEPENDENT_AMBULATORY_CARE_PROVIDER_SITE_OTHER): Payer: PRIVATE HEALTH INSURANCE | Admitting: Family Medicine

## 2010-09-12 DIAGNOSIS — S0181XA Laceration without foreign body of other part of head, initial encounter: Secondary | ICD-10-CM

## 2010-09-12 DIAGNOSIS — S0180XA Unspecified open wound of other part of head, initial encounter: Secondary | ICD-10-CM

## 2010-09-12 DIAGNOSIS — I10 Essential (primary) hypertension: Secondary | ICD-10-CM

## 2010-09-12 DIAGNOSIS — E876 Hypokalemia: Secondary | ICD-10-CM

## 2010-09-12 MED ORDER — LISINOPRIL-HYDROCHLOROTHIAZIDE 20-25 MG PO TABS
1.0000 | ORAL_TABLET | Freq: Every day | ORAL | Status: DC
Start: 2010-09-12 — End: 2010-11-15

## 2010-09-12 MED ORDER — METOPROLOL TARTRATE 100 MG PO TABS: 200.0000 mg | ORAL_TABLET | Freq: Two times a day (BID) | ORAL | Status: DC

## 2010-09-12 MED ORDER — POTASSIUM CHLORIDE 20 MEQ PO PACK: 40.0000 meq | PACK | Freq: Every day | ORAL | Status: DC

## 2010-09-12 MED ORDER — AMLODIPINE BESYLATE 10 MG PO TABS: 10.0000 mg | ORAL_TABLET | Freq: Every day | ORAL | Status: DC

## 2010-09-12 NOTE — Patient Instructions (Signed)
We will check some blood work today Please schedule a follow up appointment in 3 months

## 2010-09-12 NOTE — Progress Notes (Signed)
  Subjective:    Patient ID: Rick Schwartz, male    DOB: 10-06-1940, 70 y.o.   MRN: [id number]  HPI 1. Laceration on head:  He fell and hit his head last week.  He went to the ED and had stitched put it.  It has not been bleeding, red, warm, or swollen  2. HTN:  He has been taking his medications as prescribed.  Needs a refill.   Review of Systems Denies headaches, fevers, chills, chest pain, shortness of breath    Objective:   Physical Exam  Constitutional:       Cerebral palsy  HENT:       Large lipoma on right forehead  Neck: Normal range of motion. Neck supple.  Cardiovascular: Normal rate, regular rhythm and normal heart sounds.   Pulmonary/Chest: Effort normal and breath sounds normal. No respiratory distress. He has no wheezes.  Abdominal: Soft. He exhibits no distension.  Musculoskeletal: Normal range of motion. He exhibits no edema.  Skin: Skin is warm and dry. No erythema.       Well healed laceration above the right eyebrow.  Stitches intact.          Assessment & Plan:

## 2010-09-12 NOTE — Assessment & Plan Note (Addendum)
Still taking K+.  Unable to recheck it today because the lab is down.  It needs to be rechecked at next visit.

## 2010-09-12 NOTE — Assessment & Plan Note (Signed)
BP at goal.  No changes today.

## 2010-09-12 NOTE — Assessment & Plan Note (Signed)
Healing appropriately.  Stitches removed in clinic.  No signs of infection.  Reviewed signs of infection.

## 2010-11-15 ENCOUNTER — Other Ambulatory Visit: Payer: Self-pay | Admitting: Family Medicine

## 2010-11-15 DIAGNOSIS — I10 Essential (primary) hypertension: Secondary | ICD-10-CM

## 2010-11-15 MED ORDER — LISINOPRIL-HYDROCHLOROTHIAZIDE 20-25 MG PO TABS: 1.0000 | ORAL_TABLET | Freq: Every day | ORAL | Status: DC

## 2010-12-15 LAB — COMPREHENSIVE METABOLIC PANEL
Alkaline Phosphatase: 55
BUN: 7
CO2: 25
Chloride: 107
GFR calc non Af Amer: >60
Glucose, Bld: 114 — ABNORMAL HIGH
Potassium: 3.4 — ABNORMAL LOW
Total Bilirubin: 1.4 — ABNORMAL HIGH

## 2010-12-15 LAB — CBC
HCT: 47.8
Hemoglobin: 16
WBC: 6.6

## 2010-12-15 LAB — PROTIME-INR
INR: 1
Prothrombin Time: 13.1

## 2010-12-20 LAB — POCT I-STAT 4, (NA,K, GLUC, HGB,HCT): Glucose, Bld: 125 — ABNORMAL HIGH

## 2010-12-25 ENCOUNTER — Ambulatory Visit (INDEPENDENT_AMBULATORY_CARE_PROVIDER_SITE_OTHER): Payer: PRIVATE HEALTH INSURANCE | Admitting: Family Medicine

## 2010-12-25 VITALS — BP 155/78 | HR 78 | Temp 98.1°F

## 2010-12-25 DIAGNOSIS — E876 Hypokalemia: Secondary | ICD-10-CM

## 2010-12-25 DIAGNOSIS — I10 Essential (primary) hypertension: Secondary | ICD-10-CM

## 2010-12-25 DIAGNOSIS — E7800 Pure hypercholesterolemia, unspecified: Secondary | ICD-10-CM

## 2010-12-25 DIAGNOSIS — Z23 Encounter for immunization: Secondary | ICD-10-CM

## 2010-12-25 DIAGNOSIS — E739 Lactose intolerance, unspecified: Secondary | ICD-10-CM

## 2010-12-25 NOTE — Assessment & Plan Note (Addendum)
We'll recheck A1c to ensure within normal limits. Has strong family history of diabetes.

## 2010-12-25 NOTE — Assessment & Plan Note (Signed)
Systolic blood pressures 150s today. Has had elevations of to 150s at home, lowest at home 130s. Only giving metoprolol 200 mg in the morning not getting his evening dose. Sister are we'll start giving evening dose as well as directed. Patient and sister are to bring the blood pressure log book to next appointment, we'll check frequently at home prior to next appointment. Primary care doctor, Dr. Lula Olszewski, to reevaluate him to 3 weeks to ensure the blood pressure is within normal limits.

## 2010-12-25 NOTE — Assessment & Plan Note (Signed)
Has not had recent lipid panel. Will draw prior to next appointment. PCP to review labs with patient. Of note patient is on simvastatin 80 mg and is also on Norvasc 10 mg. Should consider at followup appointment decreasing simvastatin dose due to recent evidence for interaction.

## 2010-12-25 NOTE — Progress Notes (Signed)
  Subjective:    Patient ID: Rick Schwartz, male    DOB: 04-05-1940, 70 y.o.   MRN: [id number]  HPI Blood pressure followup: Patient's blood pressure 155/75, heart rate 78. Patient and sister state that they check his blood pressure at home-he currently ranges from systolics of 130 to 150. Did not bring blood pressure log book with him today. On review of medications, sister states she is giving him off the medications except for the metoprolol, she is only getting 2 tablets in the morning and not giving any at night.  Hypokalemia: Has history of hypokalemia. On potassium supplement, taking daily. Last M.D. notes asked for recheck of potassium at next appointment. Patient agrees to recheck at this time.  Hypercholesterolemia: [name] request lab draw to check cholesterol. Last check appears to be in 2009. On simvastatin 80 mg daily. Also on Norvasc 2 mg daily. No reports of myalgias.   Review of Systems As per above. No cp. No sob.  No fever. No weight changes.     Objective:   Physical Exam  Constitutional: He appears well-developed and well-nourished.  HENT:  Head: Normocephalic and atraumatic.  Cardiovascular: Normal rate.   Pulmonary/Chest: Effort normal. No respiratory distress.  Musculoskeletal: He exhibits no edema.       contractures of arms and legs.  Neurological: He is alert.       Contractures in all 4 extremities  Skin: Skin is warm and dry.  Psychiatric: He has a normal mood and affect. His behavior is normal.          Assessment & Plan:  All her to the

## 2010-12-25 NOTE — Assessment & Plan Note (Signed)
We'll obtain lab work to check potassium level. At followup appointment PCP will review with patient.

## 2010-12-25 NOTE — Patient Instructions (Signed)
Give metoprolol as directed- 2 tablets 2 x per day Continue to check blood pressure at home, bring in log book to next appointment.  Return for a blood pressure recheck follow up appointment with Dr. Lula Schwartz in 2-3 weeks.   Make an appointment for a fasting lab work - need to come before your follow up appointment with Dr. Lula Schwartz- and she will go over the results with you at that time-

## 2011-01-22 ENCOUNTER — Other Ambulatory Visit: Payer: Self-pay | Admitting: Family Medicine

## 2011-01-23 NOTE — Telephone Encounter (Signed)
Refill request

## 2011-03-08 ENCOUNTER — Ambulatory Visit (INDEPENDENT_AMBULATORY_CARE_PROVIDER_SITE_OTHER): Payer: PRIVATE HEALTH INSURANCE | Admitting: Family Medicine

## 2011-03-08 ENCOUNTER — Encounter: Payer: Self-pay | Admitting: Family Medicine

## 2011-03-08 VITALS — BP 142/83 | HR 76 | Temp 98.3°F | Wt 166.0 lb

## 2011-03-08 DIAGNOSIS — Z0000 Encounter for general adult medical examination without abnormal findings: Secondary | ICD-10-CM

## 2011-03-08 DIAGNOSIS — R609 Edema, unspecified: Secondary | ICD-10-CM

## 2011-03-08 DIAGNOSIS — I10 Essential (primary) hypertension: Secondary | ICD-10-CM

## 2011-03-08 NOTE — Patient Instructions (Signed)
It was nice to meet you.  Please keep taking all your blood pressure medications, and if it is consistently above 140/90, please call for an office visit.  Please think about the Shingles vaccine and the Pneumonia Vaccine and call the office if you decide you want to get one.

## 2011-03-09 DIAGNOSIS — Z0000 Encounter for general adult medical examination without abnormal findings: Secondary | ICD-10-CM | POA: Insufficient documentation

## 2011-03-09 NOTE — Progress Notes (Signed)
  Subjective:    Patient ID: Rick Schwartz, male    DOB: October 04, 1940, 70 y.o.   MRN: [id number]  HPI  Mr. Brands comes in for follow up of his blood pressure.  He is taking amlodipine, prinzide, and lopressor daily.  He denies any dizziness, headaches, vision changes, shortness of breath or chest pain or palpitations. His blood pressure is elevated today in the office, but they have brought readings from home that are consistently below 140/85.    His sister complains that his ankles have started to swell.  He says it does not bother him, and he has no pain there.  They have difficulty quantifying how long it has been going on, but his sister says she did not notice it a year ago.  They do says that it is worse when he is on his feet a lot during the day.  He says he is supposed to use a wheelchair, but sometimes walks with a cane.   The patient has never had a pneumovax, and has never had a shingles vaccine either.  He had his colonoscopy in 2010, and will need a repeat in 2012.  He is up to date on his TDap and influenza vaccine.   Review of Systems Pertinent items noted in HPI.     Objective:   Physical Exam BP 142/83  Pulse 76  Temp(Src) 98.3 F (36.8 C) (Oral)  Wt 166 lb (75.297 kg) General appearance: alert, cooperative and no distress Eyes: conjunctivae/corneas clear. PERRL, EOM's intact. Fundi benign. Throat: oral mucosa moist, no lesions.  Lungs: clear to auscultation bilaterally Heart: regular rate and rhythm, S1, S2 normal, no murmur, click, rub or gallop Abdomen: soft, non-tender; bowel sounds normal; no masses,  no organomegaly Extremities: pt with some swelling behind his lateral malleoli bilateraly.  No swelling in feet or shins/calves.  He has no pain with squeezing calves.  Pulses: 2+ and symmetric Neurologic: CP patient with atrophy of right upper and lower extremity compared to left.  Upper left extremity rigid, minimal movement. Normal sensation of lower extremities  bilaterally.        Assessment & Plan:

## 2011-03-09 NOTE — Assessment & Plan Note (Signed)
Diagnosis entered in 2008, unclear if this is new or not.  Seems to be dependent edema, pt with no signs of CHF or DVTs.  Advised wearing compression stockings.

## 2011-03-09 NOTE — Assessment & Plan Note (Signed)
Discussed Shingles vaccine and pneumovax, hand out given.  Advised both vaccines in his case.  Up to date on tetanus, influenza, colonoscopy.

## 2011-03-09 NOTE — Assessment & Plan Note (Signed)
Pt with mildly elevated reading today in clinic, but has been well controlled at home.  Advised to continue to monitor, and to call for an appointment if BP's consistently over 140/90.  Also, asked them to bring the cuff they use at home next visit to compare and ensure accuracey.

## 2011-03-23 ENCOUNTER — Telehealth: Payer: Self-pay | Admitting: Family Medicine

## 2011-03-23 MED ORDER — POTASSIUM CHLORIDE CRYS ER 20 MEQ PO TBCR: 40.0000 meq | EXTENDED_RELEASE_TABLET | Freq: Every day | ORAL | Status: DC

## 2011-03-23 NOTE — Telephone Encounter (Signed)
Ms. Rick Schwartz called to say that rx for Klorcon was not sent to pharmacy.  Please send for the 60 tab quantity to [facility]

## 2011-03-23 NOTE — Telephone Encounter (Signed)
Rx sent in

## 2011-06-13 ENCOUNTER — Other Ambulatory Visit: Payer: Self-pay | Admitting: Family Medicine

## 2011-06-13 DIAGNOSIS — I10 Essential (primary) hypertension: Secondary | ICD-10-CM

## 2011-06-13 MED ORDER — AMLODIPINE BESYLATE 10 MG PO TABS
10.0000 mg | ORAL_TABLET | Freq: Every day | ORAL | Status: DC
Start: 2011-06-13 — End: 2011-11-28

## 2011-07-13 ENCOUNTER — Other Ambulatory Visit: Payer: Self-pay | Admitting: Family Medicine

## 2011-07-13 MED ORDER — SIMVASTATIN 80 MG PO TABS: 80.0000 mg | ORAL_TABLET | Freq: Every day | ORAL | Status: DC

## 2011-09-18 ENCOUNTER — Other Ambulatory Visit: Payer: Self-pay | Admitting: *Deleted

## 2011-09-18 DIAGNOSIS — I10 Essential (primary) hypertension: Secondary | ICD-10-CM

## 2011-09-18 MED ORDER — LISINOPRIL-HYDROCHLOROTHIAZIDE 20-25 MG PO TABS
1.0000 | ORAL_TABLET | Freq: Every day | ORAL | Status: DC
Start: 2011-09-18 — End: 2011-11-28

## 2011-11-28 ENCOUNTER — Ambulatory Visit (INDEPENDENT_AMBULATORY_CARE_PROVIDER_SITE_OTHER): Payer: PRIVATE HEALTH INSURANCE | Admitting: Family Medicine

## 2011-11-28 ENCOUNTER — Encounter: Payer: Self-pay | Admitting: Family Medicine

## 2011-11-28 VITALS — BP 146/83 | HR 80 | Temp 99.1°F | Wt 160.0 lb

## 2011-11-28 DIAGNOSIS — I10 Essential (primary) hypertension: Secondary | ICD-10-CM

## 2011-11-28 DIAGNOSIS — R609 Edema, unspecified: Secondary | ICD-10-CM

## 2011-11-28 DIAGNOSIS — E7800 Pure hypercholesterolemia, unspecified: Secondary | ICD-10-CM

## 2011-11-28 DIAGNOSIS — Z23 Encounter for immunization: Secondary | ICD-10-CM

## 2011-11-28 LAB — BASIC METABOLIC PANEL
CO2: 23 mEq/L (ref 19–32)
Glucose, Bld: 103 mg/dL — ABNORMAL HIGH (ref 70–99)
Potassium: 3.8 mEq/L (ref 3.5–5.3)
Sodium: 137 mEq/L (ref 135–145)

## 2011-11-28 LAB — LIPID PANEL
HDL: 56 mg/dL (ref >39)
Total CHOL/HDL Ratio: 2.9 Ratio

## 2011-11-28 MED ORDER — ZOSTER VACCINE LIVE 19400 UNT/0.65ML ~~LOC~~ SOLR: 0.6500 mL | Freq: Once | SUBCUTANEOUS | Status: DC

## 2011-11-28 MED ORDER — POTASSIUM CHLORIDE CRYS ER 20 MEQ PO TBCR: 40.0000 meq | EXTENDED_RELEASE_TABLET | Freq: Every day | ORAL | Status: DC

## 2011-11-28 MED ORDER — ZOSTER VACCINE LIVE 19400 UNT/0.65ML ~~LOC~~ SOLR: 0.6500 mL | Freq: Once | SUBCUTANEOUS | Status: AC

## 2011-11-28 MED ORDER — MEDICAL COMPRESSION STOCKINGS MISC: 2.0000 | Freq: Once | Status: DC

## 2011-11-28 MED ORDER — LISINOPRIL-HYDROCHLOROTHIAZIDE 20-25 MG PO TABS
1.0000 | ORAL_TABLET | Freq: Every day | ORAL | Status: DC
Start: 2011-11-28 — End: 2012-01-21

## 2011-11-28 MED ORDER — HYDRALAZINE HCL 25 MG PO TABS: 25.0000 mg | ORAL_TABLET | Freq: Two times a day (BID) | ORAL | Status: DC

## 2011-11-28 MED ORDER — SIMVASTATIN 80 MG PO TABS: 80.0000 mg | ORAL_TABLET | Freq: Every day | ORAL | Status: DC

## 2011-11-28 MED ORDER — METOPROLOL TARTRATE 100 MG PO TABS: 200.0000 mg | ORAL_TABLET | Freq: Two times a day (BID) | ORAL | Status: DC

## 2011-11-28 NOTE — Assessment & Plan Note (Signed)
6 hours fasting- will check lipids today.

## 2011-11-28 NOTE — Assessment & Plan Note (Addendum)
Relatively well controlled, however, amlodipine may be contributing to leg swelling.  Will d/c, start hydralazine, and continue lisinopril/HCTZ and metoprolol. Also, will check BMET.

## 2011-11-28 NOTE — Patient Instructions (Signed)
It was good to see you.  For your leg swelling, please wear the compression hose and elevate your legs when able.   Also, the blood pressure medication amlodipine can contribute to leg swelling, so stop taking it.  I am adding Hydralazine, twice daily in it's place.  Continue the metoprolol and lisinopril/HCTZ.    I will send you a letter with your lab results.

## 2011-11-28 NOTE — Progress Notes (Signed)
  Subjective:    Patient ID: Rick Schwartz, male    DOB: 16-Nov-1940, 71 y.o.   MRN: [id number]  HPI  Rick Schwartz comes in for follow up.  He is doing overall very well.   His only complaint is his continued foot swelling.  This has been going on for a long time but he seems to think this is worse lately.  He denies any associated chest pain, dyspnea, weight gain.  He has never had a cardiac condition or been told he has heart failure.   HTN- taking almodipine, metoprolol, lisinopril/HCTZ. His sister checks his blood pressure and they have been running OK at home.    HLD- taking simvastatin, no side effects, has not had lipids checked since 2009.   Health Maintenance- had one pneumovax before turning 66, but not since then, and is up to date on Tdap, needs flu shot.  Has never had zostavax.  Has history of prostate cancer and is following with urology for PSA monitoring.   Past Medical History  Diagnosis Date  . Cerebral palsy   . Hypertension   . Hyperlipidemia   . Cancer     Prostate   History  Substance Use Topics  . Smoking status: Former Games developer  . Smokeless tobacco: Never Used  . Alcohol Use: No     Review of Systems See HPI    Objective:   Physical Exam BP 146/83  Pulse 80  Temp 99.1 F (37.3 C) (Oral)  Wt 160 lb (72.576 kg) General appearance: alert, cooperative and no distress Neck: no adenopathy, no carotid bruit, no JVD, supple, symmetrical, trachea midline and thyroid not enlarged, symmetric, no tenderness/mass/nodules Lungs: clear to auscultation bilaterally Heart: regular rate and rhythm, S1, S2 normal, no murmur, click, rub or gallop Extremities: 1+ pitting edema.  Pulses: 2+ and symmetric       Assessment & Plan:

## 2011-11-28 NOTE — Assessment & Plan Note (Signed)
Feel this is dependent edema. Will d/c Amlodipine as this may be contributing.  Rx for TED hose.  Will check BNP to ensure no heart failure component.

## 2011-11-29 LAB — PRO B NATRIURETIC PEPTIDE: Pro B Natriuretic peptide (BNP): 212.4 pg/mL — ABNORMAL HIGH (ref <126)

## 2011-11-30 ENCOUNTER — Encounter: Payer: Self-pay | Admitting: Family Medicine

## 2011-12-03 ENCOUNTER — Emergency Department (HOSPITAL_COMMUNITY): Payer: PRIVATE HEALTH INSURANCE

## 2011-12-03 ENCOUNTER — Encounter (HOSPITAL_COMMUNITY): Payer: Self-pay

## 2011-12-03 ENCOUNTER — Emergency Department (HOSPITAL_COMMUNITY)
Admission: EM | Admit: 2011-12-03 | Discharge: 2011-12-04 | Disposition: A | Discharge status: Home / Self Care | Payer: PRIVATE HEALTH INSURANCE | Attending: Emergency Medicine | Admitting: Emergency Medicine

## 2011-12-03 DIAGNOSIS — Z7982 Long term (current) use of aspirin: Secondary | ICD-10-CM | POA: Insufficient documentation

## 2011-12-03 DIAGNOSIS — S0101XA Laceration without foreign body of scalp, initial encounter: Secondary | ICD-10-CM

## 2011-12-03 DIAGNOSIS — I10 Essential (primary) hypertension: Secondary | ICD-10-CM | POA: Insufficient documentation

## 2011-12-03 DIAGNOSIS — W19XXXA Unspecified fall, initial encounter: Secondary | ICD-10-CM | POA: Insufficient documentation

## 2011-12-03 DIAGNOSIS — G809 Cerebral palsy, unspecified: Secondary | ICD-10-CM | POA: Insufficient documentation

## 2011-12-03 DIAGNOSIS — S0990XA Unspecified injury of head, initial encounter: Secondary | ICD-10-CM

## 2011-12-03 DIAGNOSIS — S0100XA Unspecified open wound of scalp, initial encounter: Secondary | ICD-10-CM | POA: Insufficient documentation

## 2011-12-03 DIAGNOSIS — Z79899 Other long term (current) drug therapy: Secondary | ICD-10-CM | POA: Insufficient documentation

## 2011-12-03 DIAGNOSIS — Y92009 Unspecified place in unspecified non-institutional (private) residence as the place of occurrence of the external cause: Secondary | ICD-10-CM | POA: Insufficient documentation

## 2011-12-03 NOTE — ED Notes (Signed)
Pt reports use of cane with ambulation, hx of falls. Reports back of head hit floor, denies loc, denies pain. Pt a+ox4

## 2011-12-03 NOTE — ED Notes (Signed)
[id number]> Expected date:<BR> Expected time:<BR> Means of arrival:<BR> Comments:<BR> Cerebral Palsy/Fall

## 2011-12-03 NOTE — ED Notes (Signed)
Pt still in KED, head wrapped with gauze - states he is comfortable and does not want ked removed yet. Still denies pain. Alert, watching tv

## 2011-12-03 NOTE — ED Notes (Signed)
Pt from home, was trying to get around in his kitchen, fell from standing position, has cerebral palsy. Hit back of head, 5 cm lac on back of head, no LOC, no blood thinner, pt alert, oriented. Pt denied any pain.

## 2011-12-04 MED ORDER — BACITRACIN ZINC 500 UNIT/GM EX OINT
TOPICAL_OINTMENT | CUTANEOUS | Status: AC
  Administered 2011-12-04: 1
  Filled 2011-12-04: qty 0.9

## 2011-12-04 MED ORDER — ACETAMINOPHEN 325 MG PO TABS
650.0000 mg | ORAL_TABLET | Freq: Once | ORAL | Status: AC
  Administered 2011-12-04: 650 mg via ORAL
  Filled 2011-12-04: qty 2

## 2011-12-04 NOTE — ED Provider Notes (Signed)
History     CSN: [id number]  Arrival date & time 12/03/11  1641   First MD Initiated Contact with Patient 12/03/11 2025      Chief Complaint  Patient presents with  . Fall    (Consider location/radiation/quality/duration/timing/severity/associated sxs/prior treatment) HPI Patient is a 71 yo male with history of CP who fell in his kitchen today and sustained a chevron shaped laceration to the occiput after a mechanical fall.  Patient denies LOC, pain, or other injuries.  He is alert and oriented and at his neurologic baseline.  He denies nausea or vomiting. Last tetanus was 2 months ago.  Patient denies other injuries.  No active bleeding at the site currently.  No neck pain, chest pain, or other concerning symptoms.There are no other associated or modifying factors.  Past Medical History  Diagnosis Date  . Cerebral palsy   . Hypertension   . Hyperlipidemia   . Cancer     Prostate    History reviewed. No pertinent past surgical history.  History reviewed. No pertinent family history.  History  Substance Use Topics  . Smoking status: Former Games developer  . Smokeless tobacco: Never Used  . Alcohol Use: No      Review of Systems  Constitutional: Negative.   HENT: Negative.   Eyes: Negative.   Respiratory: Negative.   Cardiovascular: Negative.   Gastrointestinal: Negative.   Genitourinary: Negative.   Musculoskeletal: Negative.   Skin: Positive for wound.  Neurological: Negative.   Hematological: Negative.   Psychiatric/Behavioral: Negative.   All other systems reviewed and are negative.    Allergies  Review of patient's allergies indicates no known allergies.  Home Medications   Current Outpatient Rx  Name Route Sig Dispense Refill  . ASPIRIN 81 MG PO TABS Oral Take 81 mg by mouth daily.      [name] MEDICAL COMPRESSION STOCKINGS MISC Does not apply 2 each by Does not apply route once. 1 each 0  . HYDRALAZINE HCL 25 MG PO TABS Oral Take 1 tablet (25 mg total) by  mouth 2 (two) times daily. 60 tablet 6  . LISINOPRIL-HYDROCHLOROTHIAZIDE 20-25 MG PO TABS Oral Take 1 tablet by mouth daily. 30 tablet 6  . METOPROLOL TARTRATE 100 MG PO TABS Oral Take 2 tablets (200 mg total) by mouth 2 (two) times daily. 120 tablet 6  . POTASSIUM CHLORIDE CRYS ER 20 MEQ PO TBCR Oral Take 2 tablets (40 mEq total) by mouth daily. 60 tablet 11  . SIMVASTATIN 80 MG PO TABS Oral Take 1 tablet (80 mg total) by mouth at bedtime. 30 tablet 11    BP 121/64  Pulse 73  Temp 99.7 F (37.6 C) (Oral)  Resp 16  SpO2 97%  Physical Exam  Nursing note and vitals reviewed. GEN: Well-developed, well-nourished male in no distress HEENT: patient with large soft tissue mass over right forehead at baseline, laceration over occiput as described in skin, normocephalic. Oropharynx clear without erythema EYES: PERRLA BL, no scleral icterus. NECK: Trachea midline, no meningismus CV: regular rate and rhythm. No murmurs, rubs, or gallops PULM: No respiratory distress.  No crackles, wheezes, or rales. GI: soft, non-tender. No guarding, rebound, or tenderness. + bowel sounds  GU: deferred Neuro: cranial nerves grossly 2-12 intact, no abnormalities of strength or sensation, A and O x 3 WUJ:WJXBJ arm contracture with wrist and elbow held in flexion, patient with no acute injuries or deformities Skin: No rashes petechiae, purpura, or jaundice Psych: no abnormality of mood   ED  Course  Procedures (including critical care time)  LACERATION REPAIR Performed by: Cyndra Numbers Authorized by: Cyndra Numbers Consent: Verbal consent obtained. Risks and benefits: risks, benefits and alternatives were discussed Consent given by: patient Patient identity confirmed: provided demographic data Prepped and Draped in normal sterile fashion Wound explored  Laceration Location: occipital scalp  Laceration Length: 6.6 cm  No Foreign Bodies seen or palpated  Anesthesia: none  Irrigation method:  syringe Amount of cleaning: standard  Skin closure: staples  Number of sutures: 5  Technique: skin stapling  Patient tolerance: Patient tolerated the procedure well with no immediate complications.  Labs Reviewed - No data to display Ct Head Wo Contrast  12/03/2011  *RADIOLOGY REPORT*  Clinical Data: Fall with a blow to the back of the head.  CT HEAD WITHOUT CONTRAST  Technique:  Contiguous axial images were obtained from the base of the skull through the vertex without contrast.  Comparison: Head CT scan 09/01/2010.  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  No evidence of acute infarction, hemorrhage or hydrocephalus is identified.  Subcutaneous lipoma over the right frontal bone noted.  Calvarium intact.  IMPRESSION: No acute finding.  Stable compared to prior exam.   Original Report Authenticated By: Bernadene Bell. D'ALESSIO, M.D.      1. Scalp laceration   2. Fall at home   3. Minor head injury       MDM  Patient was evaluated and had scalp lac after mechanical fall with no LOC and no deviation from neurologic baseline. Tetanus was UTD.  Patient had CT with no acute findings and wound repair was performed by myself.  Patient is to see any MD in 7-10 days for  Staple removal.  Patient was discharged in good condition.        Cyndra Numbers, MD 12/04/11 1234

## 2011-12-14 ENCOUNTER — Ambulatory Visit (INDEPENDENT_AMBULATORY_CARE_PROVIDER_SITE_OTHER): Payer: PRIVATE HEALTH INSURANCE | Admitting: *Deleted

## 2011-12-14 DIAGNOSIS — Z4802 Encounter for removal of sutures: Secondary | ICD-10-CM

## 2011-12-14 NOTE — Progress Notes (Signed)
Patient in for staple removal. Staples applied after laceration from a fall on 09/16. Dr. Deirdre Priest looked at wound . Five staples removed without difficulty.. Area cleaned with saline and bacitracin applied to area.  Wound care instructions given. Wound appear healed , however edges are not approximated well. No drainage or redness noted.  Return if any signs of infection.

## 2012-01-21 ENCOUNTER — Encounter: Payer: Self-pay | Admitting: Family Medicine

## 2012-01-21 ENCOUNTER — Ambulatory Visit (INDEPENDENT_AMBULATORY_CARE_PROVIDER_SITE_OTHER): Payer: PRIVATE HEALTH INSURANCE | Admitting: Family Medicine

## 2012-01-21 VITALS — BP 152/85 | HR 100 | Temp 98.6°F

## 2012-01-21 DIAGNOSIS — R05 Cough: Secondary | ICD-10-CM

## 2012-01-21 DIAGNOSIS — I6350 Cerebral infarction due to unspecified occlusion or stenosis of unspecified cerebral artery: Secondary | ICD-10-CM

## 2012-01-21 DIAGNOSIS — R059 Cough, unspecified: Secondary | ICD-10-CM

## 2012-01-21 DIAGNOSIS — I10 Essential (primary) hypertension: Secondary | ICD-10-CM

## 2012-01-21 DIAGNOSIS — I639 Cerebral infarction, unspecified: Secondary | ICD-10-CM

## 2012-01-21 HISTORY — DX: Cough, unspecified: R05.9

## 2012-01-21 MED ORDER — METOPROLOL TARTRATE 100 MG PO TABS: 200.0000 mg | ORAL_TABLET | Freq: Two times a day (BID) | ORAL | Status: DC

## 2012-01-21 MED ORDER — AMLODIPINE BESYLATE 10 MG PO TABS: 10.0000 mg | ORAL_TABLET | Freq: Every day | ORAL | Status: DC

## 2012-01-21 MED ORDER — GUAIFENESIN-CODEINE 100-10 MG/5ML PO SYRP: 5.0000 mL | ORAL_SOLUTION | Freq: Three times a day (TID) | ORAL | Status: DC | PRN

## 2012-01-21 MED ORDER — FEXOFENADINE HCL 180 MG PO TABS: 180.0000 mg | ORAL_TABLET | Freq: Every day | ORAL | Status: DC

## 2012-01-21 NOTE — Progress Notes (Signed)
  Subjective:    Patient ID: Rick Schwartz, male    DOB: 02-19-1941, 71 y.o.   MRN: [id number]  Cough This is a new problem. The current episode started 1 to 4 weeks ago. The problem has been gradually worsening. The cough is productive of sputum. Associated symptoms include nasal congestion, postnasal drip, rhinorrhea and wheezing. Pertinent negatives include no chest pain, chills or fever. The symptoms are aggravated by lying down. The treatment provided no relief. There is no history of asthma or emphysema.      Review of Systems  Constitutional: Negative for fever and chills.  HENT: Positive for rhinorrhea and postnasal drip.   Respiratory: Positive for cough and wheezing.   Cardiovascular: Negative for chest pain.       Objective:   Physical Exam  Constitutional: He is oriented to person, place, and time. He appears well-developed and well-nourished.  HENT:  Head: Normocephalic and atraumatic.  Right Ear: Tympanic membrane normal.  Left Ear: Tympanic membrane normal.  Nose: No mucosal edema. Right sinus exhibits no frontal sinus tenderness. Left sinus exhibits no frontal sinus tenderness.  Mouth/Throat: Mucous membranes are normal.  Eyes: No scleral icterus.  Neck: Neck supple.  Cardiovascular: Normal rate and regular rhythm.   No murmur heard. Pulmonary/Chest: Effort normal and breath sounds normal. No respiratory distress. He has no wheezes. He has no rales.  Abdominal: Soft. There is no tenderness.  Neurological: He is alert and oriented to person, place, and time.          Assessment & Plan:

## 2012-01-21 NOTE — Assessment & Plan Note (Signed)
No metoprolol this am.  Will refill.

## 2012-01-21 NOTE — Patient Instructions (Signed)

## 2012-01-21 NOTE — Assessment & Plan Note (Signed)
Symptomatic treatment.  Codeine for cough.

## 2012-03-27 ENCOUNTER — Telehealth: Payer: Self-pay | Admitting: Family Medicine

## 2012-03-27 NOTE — Telephone Encounter (Signed)
Patient dropped off form to be filled out for handicapped placard.  Please call her when completed. °

## 2012-03-28 NOTE — Telephone Encounter (Signed)
Ms. Wilkie Aye notified Handicap Placard is completed and ready to be picked up at front desk.  Ileana Ladd

## 2012-05-14 ENCOUNTER — Other Ambulatory Visit: Payer: Self-pay | Admitting: Family Medicine

## 2012-09-12 ENCOUNTER — Ambulatory Visit (INDEPENDENT_AMBULATORY_CARE_PROVIDER_SITE_OTHER): Payer: PRIVATE HEALTH INSURANCE | Admitting: Family Medicine

## 2012-09-12 ENCOUNTER — Encounter: Payer: Self-pay | Admitting: Family Medicine

## 2012-09-12 VITALS — BP 144/79 | HR 80

## 2012-09-12 DIAGNOSIS — I10 Essential (primary) hypertension: Secondary | ICD-10-CM

## 2012-09-12 DIAGNOSIS — G809 Cerebral palsy, unspecified: Secondary | ICD-10-CM

## 2012-09-12 DIAGNOSIS — C61 Malignant neoplasm of prostate: Secondary | ICD-10-CM

## 2012-09-12 DIAGNOSIS — E876 Hypokalemia: Secondary | ICD-10-CM

## 2012-09-12 LAB — BASIC METABOLIC PANEL
BUN: 13 mg/dL (ref 6–23)
Chloride: 104 mEq/L (ref 96–112)
Potassium: 3.5 mEq/L (ref 3.5–5.3)
Sodium: 138 mEq/L (ref 135–145)

## 2012-09-12 NOTE — Assessment & Plan Note (Signed)
Chronic problem on BP medications, taking K-dur.  Will check BMET.

## 2012-09-12 NOTE — Progress Notes (Signed)
  Subjective:    Patient ID: Rick Schwartz, male    DOB: 1941/02/12, 72 y.o.   MRN: [id number]  HPI: Rick Schwartz comes in for a check up.   HTN: Taking norvasc, lisinopril/hctz, metoprolol, and hydralazine without difficulty. However, he did not take his medications this morning because he thought he wasn't supposed to eat or drink anything. Denies chest pain, dizziness, palpitations, LE edema.  Patient does not check blood pressures. 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  He uses a 4-point cane in his left hand, but cannot use a walker because of his r arm.  Would like wheelchair for moving around long distances.    Protstate: Hx of prostate cancer, followed by Urology.  Denies problems, changes in urination.   Past Medical History  Diagnosis Date  . Cerebral palsy   . Hypertension   . Hyperlipidemia   . Cancer     Prostate    History  Substance Use Topics  . Smoking status: Former Games developer  . Smokeless tobacco: Never Used  . Alcohol Use: No    No family history on file.   ROS Pertinent items in HPI    Objective:  Physical Exam:  BP 144/79  Pulse 80 General appearance: alert, cooperative and no distress Head: Normocephalic, without obvious abnormality, atraumatic Lungs: clear to auscultation bilaterally Heart: regular rate and rhythm, S1, S2 normal, no murmur, click, rub or gallop Pulses: 2+ and symmetric Extrem: Rigid R UE with contractures.  2+ pulses.      Assessment & Plan:

## 2012-09-12 NOTE — Patient Instructions (Signed)
Your blood pressure today was BP: 144/79 mmHg.  Remember your goal blood pressure is about 130/80.  Please be sure to take your medication every day.    I will send you a letter with your lab results, or call you if anything is abnormal.

## 2012-09-12 NOTE — Assessment & Plan Note (Signed)
Well controlled on current regimen, continue.  Will check BMET.

## 2012-09-12 NOTE — Assessment & Plan Note (Signed)
PSA followed by Urology, recently was .15.

## 2012-09-12 NOTE — Assessment & Plan Note (Signed)
With falls recently, will Rx for wheelchair for use with long distances.  Discussed importance of continuing to walk for strength.

## 2012-09-24 ENCOUNTER — Other Ambulatory Visit: Payer: Self-pay | Admitting: *Deleted

## 2012-09-24 DIAGNOSIS — I10 Essential (primary) hypertension: Secondary | ICD-10-CM

## 2012-09-24 MED ORDER — AMLODIPINE BESYLATE 10 MG PO TABS: 10.0000 mg | ORAL_TABLET | Freq: Every day | ORAL | Status: DC

## 2012-12-29 ENCOUNTER — Telehealth: Payer: Self-pay | Admitting: Family Medicine

## 2012-12-29 ENCOUNTER — Other Ambulatory Visit: Payer: Self-pay | Admitting: Family Medicine

## 2012-12-29 DIAGNOSIS — I10 Essential (primary) hypertension: Secondary | ICD-10-CM

## 2012-12-29 MED ORDER — POTASSIUM CHLORIDE CRYS ER 20 MEQ PO TBCR: 40.0000 meq | EXTENDED_RELEASE_TABLET | Freq: Every day | ORAL | Status: DC

## 2012-12-29 MED ORDER — METOPROLOL TARTRATE 100 MG PO TABS: 200.0000 mg | ORAL_TABLET | Freq: Two times a day (BID) | ORAL | Status: DC

## 2012-12-29 MED ORDER — SIMVASTATIN 80 MG PO TABS: 80.0000 mg | ORAL_TABLET | Freq: Every day | ORAL | Status: DC

## 2012-12-29 MED ORDER — LISINOPRIL-HYDROCHLOROTHIAZIDE 20-25 MG PO TABS: ORAL_TABLET | ORAL | Status: DC

## 2012-12-29 MED ORDER — HYDRALAZINE HCL 25 MG PO TABS: 25.0000 mg | ORAL_TABLET | Freq: Two times a day (BID) | ORAL | Status: DC

## 2012-12-29 NOTE — Telephone Encounter (Signed)
The only medication on pt's list that does not need refills is his amlodipine.  He states that he needs all of the other filled.  Please advise. Cathyann Kilfoyle,CMA

## 2012-12-29 NOTE — Telephone Encounter (Signed)
One month supply of all medications sent to [facility]. I look forward to meeting him at his appointment and I will be happy to send in longer prescriptions at that time.  Thanks! Jestin Burbach M. Vijay Durflinger, M.D.

## 2012-12-29 NOTE — Telephone Encounter (Signed)
Pt called and has a scheduled appointment for 10/20, but he is out of all of medications. He would like enough of refills to last him until his appointment. JW

## 2012-12-29 NOTE — Telephone Encounter (Signed)
Pt is aware that rx was sent to pharmacy. Deeric Cruise,CMA  

## 2013-01-05 ENCOUNTER — Ambulatory Visit (INDEPENDENT_AMBULATORY_CARE_PROVIDER_SITE_OTHER): Payer: PRIVATE HEALTH INSURANCE | Admitting: Family Medicine

## 2013-01-05 ENCOUNTER — Encounter: Payer: Self-pay | Admitting: Family Medicine

## 2013-01-05 VITALS — BP 128/70 | HR 84 | Temp 98.6°F | Ht 66.0 in | Wt 161.9 lb

## 2013-01-05 DIAGNOSIS — I10 Essential (primary) hypertension: Secondary | ICD-10-CM

## 2013-01-05 DIAGNOSIS — E876 Hypokalemia: Secondary | ICD-10-CM

## 2013-01-05 DIAGNOSIS — G809 Cerebral palsy, unspecified: Secondary | ICD-10-CM

## 2013-01-05 DIAGNOSIS — Z23 Encounter for immunization: Secondary | ICD-10-CM

## 2013-01-05 DIAGNOSIS — E785 Hyperlipidemia, unspecified: Secondary | ICD-10-CM

## 2013-01-05 DIAGNOSIS — E7800 Pure hypercholesterolemia, unspecified: Secondary | ICD-10-CM

## 2013-01-05 LAB — CBC
HCT: 49.7 % (ref 39.0–52.0)
Hemoglobin: 17 g/dL (ref 13.0–17.0)
MCH: 28.7 pg (ref 26.0–34.0)
MCHC: 34.2 g/dL (ref 30.0–36.0)
MCV: 83.8 fL (ref 78.0–100.0)
RDW: 14.1 % (ref 11.5–15.5)

## 2013-01-05 LAB — BASIC METABOLIC PANEL
BUN: 11 mg/dL (ref 6–23)
CO2: 23 mEq/L (ref 19–32)
Chloride: 106 mEq/L (ref 96–112)
Glucose, Bld: 114 mg/dL — ABNORMAL HIGH (ref 70–99)
Potassium: 4.2 mEq/L (ref 3.5–5.3)

## 2013-01-05 LAB — LDL CHOLESTEROL, DIRECT: Direct LDL: 97 mg/dL

## 2013-01-05 MED ORDER — LISINOPRIL-HYDROCHLOROTHIAZIDE 20-25 MG PO TABS: ORAL_TABLET | ORAL | Status: DC

## 2013-01-05 MED ORDER — METOPROLOL TARTRATE 100 MG PO TABS: 200.0000 mg | ORAL_TABLET | Freq: Two times a day (BID) | ORAL | Status: DC

## 2013-01-05 MED ORDER — POTASSIUM CHLORIDE CRYS ER 20 MEQ PO TBCR: 40.0000 meq | EXTENDED_RELEASE_TABLET | Freq: Every day | ORAL | Status: DC

## 2013-01-05 MED ORDER — AMLODIPINE BESYLATE 10 MG PO TABS: 10.0000 mg | ORAL_TABLET | Freq: Every day | ORAL | Status: DC

## 2013-01-05 MED ORDER — SIMVASTATIN 80 MG PO TABS: 80.0000 mg | ORAL_TABLET | Freq: Every day | ORAL | Status: DC

## 2013-01-05 NOTE — Progress Notes (Signed)
Patient ID: Rick Schwartz, male   DOB: 1940-10-03, 72 y.o.   MRN: [id number]  [facility] Dwana Garin M. Tove Wideman, MD Phone: [phone]   Subjective: HPI: Patient is a 72 y.o. male presenting to clinic today for follow up appointment. Concerns today include none  1. Hypertension Blood pressure at home: Varies. Higher with pain, highest 158 systolic. Blood pressure today: 128/70 Taking Meds: Yes. Still taking Amlodipine, lisinopril-hctz, metoprolol. (Hydralazine prescribed, but not started.) Side effects: None. ROS: Denies headache, visual changes, nausea, vomiting, chest pain, abdominal pain or shortness of breath.  2. Hypokalemia - Chronic. On daily Kdur. Needs Bmet today. Last Bmet was in June of this year.  3. Hyperlipidemia - On Zocor. Needs LDL today, last meal was 7 hours ago. No changes in diet recently. Overall feeling well  4. Cerebral Palsy - Stable. Walking with cane, no falls in over one year. Still requesting a wheelchair, but unable to obtain chair. Will need face-to-face assessment  History Reviewed: Former smoker. Health Maintenance: Needs flu shot today  ROS: Please see HPI above.  Objective: Office vital signs reviewed. BP 128/70  Pulse 84  Temp(Src) 98.6 F (37 C) (Oral)  Ht 5\' 6"  (1.676 m)  Wt 161 lb 14.4 oz (73.437 kg)  BMI 26.14 kg/m2  Physical Examination:  General: Awake, alert. NAD HEENT: Atraumatic, normocephalic. MMM Pulm: CTAB, no wheezes. Good effort Cardio: RRR, no murmurs appreciated. Abdomen:+BS, soft, nontender, nondistended. Umbilical hernia easily reduced Extremities: No edema. Contracture RUE, unchanged. Moves lower extremities well. Neuro: Grossly intact, walks with 4 point cane  Assessment: 72 y.o. male follow up appointment  Plan: See Problem List and After Visit Summary

## 2013-01-05 NOTE — Assessment & Plan Note (Signed)
Con't Zocor. Will check lipid panel today.

## 2013-01-05 NOTE — Assessment & Plan Note (Signed)
Stable. No recent falls. Will ask family to send me a face-to-face form from medical supply store to begin the process for wheelchair to use only as needed when ambulating long distances.

## 2013-01-05 NOTE — Patient Instructions (Signed)
It was nice to meet you today. I will see you back in about 3 months or sooner if you need anything!  You can either use the Hydralazine (rather than the Amlodipine) for one month since you have already bought it, or you can continue with the Amlodipine and discard the Hydralazine. Do not use them both at the same time.  Please have the home care service or medical equpiment store send me a Face-to-Face form for the wheelchair. Your insurance company will need this. Our fax number is [phone].  Kynlie Jane M. Keimari , M.D.

## 2013-01-05 NOTE — Assessment & Plan Note (Signed)
Con't Kdur. BMet today.

## 2013-01-05 NOTE — Assessment & Plan Note (Signed)
BP at goal. Con't Amlodipine and d/c Hydralazine on med list. Con't other medications. BMet today.

## 2013-01-06 ENCOUNTER — Encounter: Payer: Self-pay | Admitting: Family Medicine

## 2013-06-22 ENCOUNTER — Ambulatory Visit (HOSPITAL_COMMUNITY)
Admission: RE | Admit: 2013-06-22 | Discharge: 2013-06-22 | Disposition: A | Discharge status: Home / Self Care | Payer: PRIVATE HEALTH INSURANCE | Source: Ambulatory Visit | Attending: Family Medicine | Admitting: Family Medicine

## 2013-06-22 ENCOUNTER — Encounter: Payer: Self-pay | Admitting: Family Medicine

## 2013-06-22 ENCOUNTER — Ambulatory Visit (INDEPENDENT_AMBULATORY_CARE_PROVIDER_SITE_OTHER): Payer: PRIVATE HEALTH INSURANCE | Admitting: Family Medicine

## 2013-06-22 ENCOUNTER — Ambulatory Visit: Payer: PRIVATE HEALTH INSURANCE | Admitting: Family Medicine

## 2013-06-22 VITALS — BP 118/76 | HR 75 | Temp 99.5°F | Ht 66.0 in

## 2013-06-22 DIAGNOSIS — R609 Edema, unspecified: Secondary | ICD-10-CM

## 2013-06-22 DIAGNOSIS — M7989 Other specified soft tissue disorders: Secondary | ICD-10-CM

## 2013-06-22 LAB — POCT URINALYSIS DIPSTICK
BILIRUBIN UA: NEGATIVE
Blood, UA: NEGATIVE
GLUCOSE UA: NEGATIVE
Ketones, UA: NEGATIVE
LEUKOCYTES UA: NEGATIVE
NITRITE UA: NEGATIVE
PH UA: 6
Protein, UA: NEGATIVE
Spec Grav, UA: 1.015
Urobilinogen, UA: 1

## 2013-06-22 LAB — COMPREHENSIVE METABOLIC PANEL
ALT: 25 U/L (ref 0–53)
AST: 22 U/L (ref 0–37)
Albumin: 3.8 g/dL (ref 3.5–5.2)
Alkaline Phosphatase: 50 U/L (ref 39–117)
BILIRUBIN TOTAL: 1.3 mg/dL — AB (ref 0.2–1.2)
BUN: 14 mg/dL (ref 6–23)
CO2: 20 meq/L (ref 19–32)
Calcium: 9.5 mg/dL (ref 8.4–10.5)
Chloride: 103 mEq/L (ref 96–112)
Creat: 0.98 mg/dL (ref 0.50–1.35)
Glucose, Bld: 123 mg/dL — ABNORMAL HIGH (ref 70–99)
Potassium: 4 mEq/L (ref 3.5–5.3)
SODIUM: 135 meq/L (ref 135–145)
TOTAL PROTEIN: 6.5 g/dL (ref 6.0–8.3)

## 2013-06-22 LAB — CBC
HCT: 47.2 % (ref 39.0–52.0)
HEMOGLOBIN: 16.4 g/dL (ref 13.0–17.0)
MCH: 29.1 pg (ref 26.0–34.0)
MCHC: 34.7 g/dL (ref 30.0–36.0)
MCV: 83.8 fL (ref 78.0–100.0)
Platelets: 263 10*3/uL (ref 150–400)
RBC: 5.63 MIL/uL (ref 4.22–5.81)
RDW: 13.8 % (ref 11.5–15.5)
WBC: 7.2 10*3/uL (ref 4.0–10.5)

## 2013-06-22 LAB — TSH: TSH: 2.151 u[IU]/mL (ref 0.350–4.500)

## 2013-06-22 NOTE — Progress Notes (Signed)
Patient ID: Rick Schwartz    DOB: [birth year], 73 y.o.   MRN: [id number] --- Subjective:  Rick Schwartz is a 73 y.o.male with h/o CP and prostate CA who presents with bilateral leg swelling. Is accompanied by his sister.  - swelling of the legs: started about 4 days ago. Started all of a sudden. No pain. No trauma. HE has not been more sedentary than usual. He ambulates in a wheel chair and sometimes walks. He denies any chest pain, shortness of breath. He noticed it to be better this morning than yesterday. No erythema. Mild pain in the right leg.  He had a similar episode of swelling last year and it resolved spontaneously. Feels like right leg is more swollen than right. No new meds.    ROS: see HPI Past Medical History: reviewed and updated medications and allergies. Social History: Tobacco: former smoker  Objective: Filed Vitals:   06/22/13 1103  BP: 118/76  Pulse: 75  Temp: 99.5 F (37.5 C)    Physical Examination:   General appearance - alert, well appearing, and in no distress Neck - no JVD appreciated Chest - clear to auscultation, no wheezes, rales or rhonchi, symmetric air entry Heart - normal rate, distant heart sounds, difficult to appreciate any murmur or extra heart sound Abdomen - soft, non edematous, non distended, non tender Extremities - +3 pedal pitting edema on right and 2+ on left. 2+ pretibial pitting edema on right, trace on left. Negative homan's. No calf tenderness. Left calf measures 37cm and right calf 32cm.  Dorsalis pedis pulses difficult to visualize.

## 2013-06-22 NOTE — Patient Instructions (Signed)
Keep your legs elevated to help with the swelling.   I will call you with the test results.

## 2013-06-22 NOTE — Assessment & Plan Note (Signed)
Has been seen for this before. Likely dependent edema.  In context of left leg measuring greater than right but right leg with more pitting edema than left, as well as h/o cancer and sedentary with CP, will obtain venous doppler to rule out DVT bilaterally.  Will also check CBC, CMP, TSH, pro BNP and UA to rule out any other causes.  Leg elevation. Difficulty feeling distal pulse and therefore hesitate to start compressions.  Patient on amlodipine which could also be playing a role. Will continue for now. Recommended follow up in 1 week. If persistent swelling, may consider d/cing amlodipine.

## 2013-06-23 ENCOUNTER — Telehealth: Payer: Self-pay | Admitting: Family Medicine

## 2013-06-23 LAB — PRO B NATRIURETIC PEPTIDE: PRO B NATRI PEPTIDE: 214 pg/mL — AB (ref <126)

## 2013-06-23 NOTE — Telephone Encounter (Signed)
Called pt. Informed. .Rick Schwartz  

## 2013-06-23 NOTE — Telephone Encounter (Signed)
Please let patient or his sister know that his labs related to his swelling are all normal. He needs to keep his legs elevated to help with the swelling.   Thank you!  Liam Graham, PGY-3 Family Medicine Resident

## 2013-07-02 ENCOUNTER — Ambulatory Visit: Payer: PRIVATE HEALTH INSURANCE | Admitting: Family Medicine

## 2013-09-16 ENCOUNTER — Telehealth: Payer: Self-pay | Admitting: *Deleted

## 2013-09-16 NOTE — Telephone Encounter (Signed)
LM for patient to call back with a male at number.  He stated that we needed to call patient back tomorrow. Jazmin Hartsell,CMA

## 2013-12-24 ENCOUNTER — Telehealth: Payer: Self-pay | Admitting: Family Medicine

## 2013-12-24 DIAGNOSIS — Z0000 Encounter for general adult medical examination without abnormal findings: Secondary | ICD-10-CM

## 2013-12-24 NOTE — Telephone Encounter (Signed)
Pt needs referral to [facility] for colonoscopy.

## 2013-12-24 NOTE — Telephone Encounter (Signed)
Will forward to MD to place referral for colonscopy. Brok Stocking,CMA

## 2013-12-25 NOTE — Telephone Encounter (Signed)
Referral placed. Thank you. Where can I find this scanned document? I looked under Media and Encounters but did not see it.  Hilton Sinclair, MD

## 2013-12-25 NOTE — Telephone Encounter (Signed)
Health maintenance tab lists it was done 08/2008 and is not due until 08/2018. I have not met Mr Gullo. Please ask him if they told him it was due 2015 and we just have it listed incorrectly. THanks.  Hilton Sinclair, MD

## 2013-12-25 NOTE — Telephone Encounter (Signed)
There is a scanned in note from [facility] for patient to repeat in 5 years.  Willamina Grieshop,CMA

## 2013-12-25 NOTE — Telephone Encounter (Signed)
It was scanned in under media 09/13/2008.  It says consultation. Rick Schwartz,CMA

## 2013-12-27 ENCOUNTER — Encounter: Payer: Self-pay | Admitting: Family Medicine

## 2013-12-27 DIAGNOSIS — R933 Abnormal findings on diagnostic imaging of other parts of digestive tract: Secondary | ICD-10-CM | POA: Insufficient documentation

## 2013-12-27 NOTE — Telephone Encounter (Signed)
Thank you!  Rick Schwartz Rick Jocilyn Trego, MD  

## 2014-01-19 ENCOUNTER — Other Ambulatory Visit: Payer: Self-pay | Admitting: *Deleted

## 2014-01-19 DIAGNOSIS — I10 Essential (primary) hypertension: Secondary | ICD-10-CM

## 2014-01-20 MED ORDER — SIMVASTATIN 80 MG PO TABS: 80.0000 mg | ORAL_TABLET | Freq: Every day | ORAL | Status: DC

## 2014-01-20 MED ORDER — LISINOPRIL-HYDROCHLOROTHIAZIDE 20-25 MG PO TABS: ORAL_TABLET | ORAL | Status: DC

## 2014-01-20 MED ORDER — METOPROLOL TARTRATE 100 MG PO TABS: 200.0000 mg | ORAL_TABLET | Freq: Two times a day (BID) | ORAL | Status: DC

## 2014-01-20 MED ORDER — AMLODIPINE BESYLATE 10 MG PO TABS: 10.0000 mg | ORAL_TABLET | Freq: Every day | ORAL | Status: DC

## 2014-01-20 MED ORDER — POTASSIUM CHLORIDE CRYS ER 20 MEQ PO TBCR: 40.0000 meq | EXTENDED_RELEASE_TABLET | Freq: Every day | ORAL | Status: DC

## 2014-01-25 ENCOUNTER — Ambulatory Visit (INDEPENDENT_AMBULATORY_CARE_PROVIDER_SITE_OTHER): Payer: PRIVATE HEALTH INSURANCE | Admitting: Family Medicine

## 2014-01-25 ENCOUNTER — Telehealth: Payer: Self-pay | Admitting: Family Medicine

## 2014-01-25 ENCOUNTER — Encounter: Payer: Self-pay | Admitting: Family Medicine

## 2014-01-25 ENCOUNTER — Ambulatory Visit (INDEPENDENT_AMBULATORY_CARE_PROVIDER_SITE_OTHER): Payer: PRIVATE HEALTH INSURANCE | Admitting: *Deleted

## 2014-01-25 VITALS — BP 146/80 | HR 76 | Temp 98.1°F | Wt 167.0 lb

## 2014-01-25 DIAGNOSIS — R059 Cough, unspecified: Secondary | ICD-10-CM

## 2014-01-25 DIAGNOSIS — R2681 Unsteadiness on feet: Secondary | ICD-10-CM | POA: Insufficient documentation

## 2014-01-25 DIAGNOSIS — R05 Cough: Secondary | ICD-10-CM

## 2014-01-25 DIAGNOSIS — I10 Essential (primary) hypertension: Secondary | ICD-10-CM

## 2014-01-25 DIAGNOSIS — E739 Lactose intolerance, unspecified: Secondary | ICD-10-CM

## 2014-01-25 DIAGNOSIS — Z23 Encounter for immunization: Secondary | ICD-10-CM

## 2014-01-25 LAB — POCT GLYCOSYLATED HEMOGLOBIN (HGB A1C): Hemoglobin A1C: 6.1

## 2014-01-25 MED ORDER — FEXOFENADINE HCL 180 MG PO TABS: 180.0000 mg | ORAL_TABLET | Freq: Every day | ORAL | Status: DC

## 2014-01-25 MED ORDER — ASPIRIN 81 MG PO TABS: 81.0000 mg | ORAL_TABLET | Freq: Every day | ORAL | Status: DC

## 2014-01-25 MED ORDER — METOPROLOL TARTRATE 100 MG PO TABS: 200.0000 mg | ORAL_TABLET | Freq: Two times a day (BID) | ORAL | Status: DC

## 2014-01-25 MED ORDER — AMLODIPINE BESYLATE 10 MG PO TABS: 10.0000 mg | ORAL_TABLET | Freq: Every day | ORAL | Status: DC

## 2014-01-25 MED ORDER — SIMVASTATIN 80 MG PO TABS: 80.0000 mg | ORAL_TABLET | Freq: Every day | ORAL | Status: DC

## 2014-01-25 MED ORDER — POTASSIUM CHLORIDE CRYS ER 20 MEQ PO TBCR: 40.0000 meq | EXTENDED_RELEASE_TABLET | Freq: Every day | ORAL | Status: DC

## 2014-01-25 MED ORDER — LISINOPRIL-HYDROCHLOROTHIAZIDE 20-25 MG PO TABS: ORAL_TABLET | ORAL | Status: DC

## 2014-01-25 NOTE — Progress Notes (Signed)
Patient ID: Rick Schwartz, male   DOB: 1940/10/17, 73 y.o.   MRN: [id number] Subjective:   CC: Follow up HTN, glucose intolerance  HPI:   F/u HTN On norvasc 10mg , lisinopril-HCTZ, metoprolol, taking regularly, [name] helps manage.  No side effects.  BP at home has been high 130s/70s, usually not higher than 90s on bottom.  No chest pain or dyspnea, dizziness, syncope, or palpitations.  Creatinine checked 06/2013 was normal as was K.  Glucose intolerance No polyuria or polydypsia. No h/o dx with diabetes but sister concerned with h/o glucose intolerance.  H/o fall Reportedly, a few months ago, due to right foot getting caught under him. Declines PT at home or office. Sister states no rugs or wires across floor.  Review of Systems - Per HPI.   PMH - h/o prostate cancer, hypokalemia, HTN, HLD, glucose intolerance, CP, BPH  Smoking status: Quit about 20 years ago, smoked about 40 years, 1/2 ppd SH: Drinks about 15 cans in a week, about 2 / day.  No drug use, no h/o IVDU. Lives on own. Sister visits every day, responsible for getting him back and forth to doctor. In the house, walks with quad-cane. Not fallen in a couple months.  Nurse comes by the house every 3 months    Objective:  Physical Exam BP 146/80 mmHg  Pulse 76  Temp(Src) 98.1 F (36.7 C) (Oral)  Wt 167 lb (75.751 kg) GEN: NAD, pleasant CV: RRR, no m/r/g, 2+ bilateral radial pulses PULM: CTAB, normal effort MSK: Right side (arm and leg) weaker, right arm contractured in, left leg 5/5, left arm moves purposefully    Assessment:     Rick Schwartz is a 73 y.o. male here for f/u HTN and glucose intolerance.    Plan:     # See problem list and after visit summary for problem-specific plans.   # Health Maintenance: Flu shot today   Follow-up: Follow up in 6 mo for health maintenance.   Hilton Sinclair, MD [place]

## 2014-01-25 NOTE — Assessment & Plan Note (Signed)
No symptoms of hyperglycemia. H/o glucose intolerance. - Check A1c today. - Stay as active as possible.

## 2014-01-25 NOTE — Assessment & Plan Note (Signed)
Reports fall a few months ago, seeming to be due to decreased leg strength right. Declines PT. Sister reports safe home environment.  - continue to think about PT. - strengthen large muscles in lower extremities as able. [facility] recommended.

## 2014-01-25 NOTE — Telephone Encounter (Deleted)
Will call patient to discuss simvastatin. His dose plus amlodipine puts him at greater risk of myopathy. Will discuss switching to atorvastatin.  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 We do not need to start medication but he should be careful about intake of sugary foods, beverages, and foods high in carbohydrates. He should also stay as active as possible even with chair exercises. We can recheck in 1 year.  Rick Sinclair, MD

## 2014-01-25 NOTE — Patient Instructions (Signed)
I will refill medications for 6 months. For now, decrease potassium to 1 tablet daily and we can recheck this in 1 month.  Come in for lab-only visit.  BP looks good today. Keep doing what you are doing.  With the history of glucose intolerance, we will check an A1c today and I will call if this is NOT normal.  Follow up with me in 6 months for a complete physical exam.  Stay active, but be sure to use your cane to avoid falls. Keep large muscles in the legs strong. It would be beneficial to meet with Dr McDiarmid in our geriatric clinic whenever convenient.  Best,  Hilton Sinclair, MD

## 2014-01-25 NOTE — Assessment & Plan Note (Addendum)
Well controlled, goal for age <150/<90.  - Continue current medications. Refilled. - Decrease potassium to once daily and check BMET in 1 month. - Stay active, using cane to avoid falls.

## 2014-01-26 NOTE — Telephone Encounter (Signed)
Called to discuss A1c and statin with patient. Discussed the following with him, and he asked me to call his sister to discuss with her as well. Did not have her number, but other sister's number was in EPIC. Spoke with sister Katy Apo who will convey information to other sister who goes with pt to his appts.   A1c is in prediabetic range (6.1). We do not need to start medication but he should be careful about intake of sugary foods, beverages, and foods high in carbohydrates. He should also stay as active as possible even with chair exercises. We can recheck in 1 year. Discussed DM diet recs with him (what is a starch, 2 servings of starch per meal and 1 per snack, protein with breakfast).   Lipids: Also asked sister to call back with information about if we can switch from simvastatin to atorvastatin or rosouvastatin, given risk of myopathy with high dose simvastain and amlodipine which pt is on. They will call and let us know.  Hilton Sinclair, MD

## 2014-10-12 ENCOUNTER — Encounter: Payer: Self-pay | Admitting: Internal Medicine

## 2014-10-12 ENCOUNTER — Ambulatory Visit (INDEPENDENT_AMBULATORY_CARE_PROVIDER_SITE_OTHER): Payer: Medicare Other | Admitting: Internal Medicine

## 2014-10-12 ENCOUNTER — Ambulatory Visit (HOSPITAL_COMMUNITY)
Admission: RE | Admit: 2014-10-12 | Discharge: 2014-10-12 | Disposition: A | Discharge status: Home / Self Care | Payer: Medicare Other | Source: Ambulatory Visit | Attending: Family Medicine | Admitting: Family Medicine

## 2014-10-12 VITALS — BP 148/67 | HR 73 | Temp 98.1°F | Wt 162.0 lb

## 2014-10-12 DIAGNOSIS — I10 Essential (primary) hypertension: Secondary | ICD-10-CM | POA: Diagnosis not present

## 2014-10-12 DIAGNOSIS — Z7189 Other specified counseling: Secondary | ICD-10-CM | POA: Diagnosis not present

## 2014-10-12 DIAGNOSIS — R739 Hyperglycemia, unspecified: Secondary | ICD-10-CM

## 2014-10-12 DIAGNOSIS — E7800 Pure hypercholesterolemia, unspecified: Secondary | ICD-10-CM

## 2014-10-12 DIAGNOSIS — R9431 Abnormal electrocardiogram [ECG] [EKG]: Secondary | ICD-10-CM | POA: Insufficient documentation

## 2014-10-12 DIAGNOSIS — Z7689 Persons encountering health services in other specified circumstances: Secondary | ICD-10-CM

## 2014-10-12 LAB — LIPID PANEL
CHOL/HDL RATIO: 3.5 ratio (ref <=5.0)
Cholesterol: 118 mg/dL — ABNORMAL LOW (ref 125–200)
HDL: 34 mg/dL — ABNORMAL LOW (ref >=40)
LDL CALC: 60 mg/dL (ref <130)
TRIGLYCERIDES: 122 mg/dL (ref <150)
VLDL: 24 mg/dL (ref <30)

## 2014-10-12 LAB — BASIC METABOLIC PANEL
BUN: 19 mg/dL (ref 7–25)
CALCIUM: 9.7 mg/dL (ref 8.6–10.3)
CO2: 24 mEq/L (ref 20–31)
Chloride: 101 mEq/L (ref 98–110)
Creat: 1.21 mg/dL — ABNORMAL HIGH (ref 0.70–1.18)
Glucose, Bld: 94 mg/dL (ref 65–99)
POTASSIUM: 4 meq/L (ref 3.5–5.3)
Sodium: 138 mEq/L (ref 135–146)

## 2014-10-12 LAB — POCT GLYCOSYLATED HEMOGLOBIN (HGB A1C): Hemoglobin A1C: 5.9

## 2014-10-12 MED ORDER — AMLODIPINE BESYLATE 10 MG PO TABS: 10.0000 mg | ORAL_TABLET | Freq: Every day | ORAL | Status: DC

## 2014-10-12 MED ORDER — SIMVASTATIN 80 MG PO TABS: 80.0000 mg | ORAL_TABLET | Freq: Every day | ORAL | Status: DC

## 2014-10-12 MED ORDER — LISINOPRIL-HYDROCHLOROTHIAZIDE 20-25 MG PO TABS: ORAL_TABLET | ORAL | Status: DC

## 2014-10-12 MED ORDER — ASPIRIN 81 MG PO TABS: 81.0000 mg | ORAL_TABLET | Freq: Every day | ORAL | Status: DC

## 2014-10-12 MED ORDER — METOPROLOL TARTRATE 100 MG PO TABS: 200.0000 mg | ORAL_TABLET | Freq: Two times a day (BID) | ORAL | Status: DC

## 2014-10-12 NOTE — Patient Instructions (Signed)
It was nice to meet you! Thank you for coming into clinic today.  I am glad you are feeling well overall. I have refilled your blood pressure medications.  I have ordered some annual labs for you. I will send you those results in the mail. I have not re-ordered your potassium medication yet. I want to wait to see the results of your labs before I reorder this.  -Dr. Brett Albino

## 2014-10-12 NOTE — Progress Notes (Signed)
   Rick Schwartz Phone: [phone]  Subjective:  Rick Schwartz is a 74 year old male who is here today to establish care with his new physician. He is doing well overall. He has no concerns today. He needs all of his medications filled today. He has been taking Klor-con daily for the past year, but does not know if he should continue to take this. He received a colonoscopy in 2015, which was normal except for a few colonic polyps.  All relevant systems were reviewed and were negative unless otherwise noted in the HPI  Past Medical History- Significant for HTN, glucose intolerance, HLD, h/o prostate cancer treated with surgery, cerebral palsy Reviewed problem list.  Medications- reviewed and updated Current Outpatient Prescriptions  Medication Sig Dispense Refill  . amLODipine (NORVASC) 10 MG tablet Take 1 tablet (10 mg total) by mouth daily. 30 tablet 5  . aspirin 81 MG tablet Take 1 tablet (81 mg total) by mouth daily. 30 tablet 5  . lisinopril-hydrochlorothiazide (PRINZIDE,ZESTORETIC) 20-25 MG per tablet TAKE ONE TABLET BY MOUTH EVERY DAY. 30 tablet 5  . metoprolol (LOPRESSOR) 100 MG tablet Take 2 tablets (200 mg total) by mouth 2 (two) times daily. 120 tablet 5  . potassium chloride SA (KLOR-CON M20) 20 MEQ tablet Take 2 tablets (40 mEq total) by mouth daily. 60 tablet 5  . simvastatin (ZOCOR) 80 MG tablet Take 1 tablet (80 mg total) by mouth at bedtime. 30 tablet 5  . [DISCONTINUED] potassium chloride (KLOR-CON) 20 MEQ packet Take 40 mEq by mouth daily. 50 packet 3   No current facility-administered medications for this visit.   Chief complaint-noted Additions have been made the family history:  -Mother with DM and HTN  -Brother with HTN and DM, now deceased from diabetic complications  -4 sisters with HTN Social history- patient is a former smoker. He quit 20 years ago. Currently drinks a beer or a small glass of gin occasionally.  Objective: BP 148/67 mmHg   Pulse 73  Temp(Src) 98.1 F (36.7 C) (Oral)  Wt 162 lb (73.483 kg) Gen: NAD, alert, cooperative with exam, Pt with dysarthria HEENT: NCAT, EOMI, PERRL Neck: FROM, supple CV: RRR, good S1/S2, no murmur Resp: CTABL, no wheezes, non-labored Abd: SNTND, BS present, no guarding or organomegaly Ext: 1+ pitting edema in LE's bilaterally, LUE is contracted and pressed against the chest, tremor noted in LE's with movement Neuro: Alert and oriented, no gross deficits Skin: no rashes, no lesions  Assessment/Plan: Rick Schwartz is a 74 year old male with a PMH of HTN, HLD, hyperglycemia, cerebral palsy, and a history of prostate cancer who is doing well overall. No concerns today. -Will refill his prescriptions- Norvasc 10mg  qd, ASA 81mg  qd, Prinzide 20-25mg  qd, Metoprolol 100mg  qd, and Zocor 80mg  qd -Will order annual Lipid Panel and HgbA1c (given Pt's history of hyperglycemia). -Will wait to refill Klor-con until we get the results of his BMet. -Given Pt's long history of HTN, will get a baseline EKG today, as Pt does not have one on file.   Hyman Bible, MD PGY-1

## 2014-10-12 NOTE — Addendum Note (Signed)
Addended by: Maryland Pink on: 10/12/2014 01:43 PM   Modules accepted: Orders

## 2014-10-14 ENCOUNTER — Encounter: Payer: Self-pay | Admitting: Internal Medicine

## 2015-04-16 ENCOUNTER — Other Ambulatory Visit: Payer: Self-pay | Admitting: Internal Medicine

## 2015-04-26 ENCOUNTER — Ambulatory Visit (INDEPENDENT_AMBULATORY_CARE_PROVIDER_SITE_OTHER): Payer: Medicare Other | Admitting: Internal Medicine

## 2015-04-26 ENCOUNTER — Encounter: Payer: Self-pay | Admitting: Internal Medicine

## 2015-04-26 VITALS — BP 137/78 | HR 79 | Temp 98.1°F

## 2015-04-26 DIAGNOSIS — L97519 Non-pressure chronic ulcer of other part of right foot with unspecified severity: Secondary | ICD-10-CM | POA: Insufficient documentation

## 2015-04-26 DIAGNOSIS — L97511 Non-pressure chronic ulcer of other part of right foot limited to breakdown of skin: Secondary | ICD-10-CM

## 2015-04-26 MED ORDER — DOXYCYCLINE HYCLATE 50 MG PO CAPS: 100.0000 mg | ORAL_CAPSULE | Freq: Every day | ORAL | Status: DC

## 2015-04-26 MED ORDER — AMOXICILLIN-POT CLAVULANATE 875-125 MG PO TABS: 1.0000 | ORAL_TABLET | Freq: Two times a day (BID) | ORAL | Status: DC

## 2015-04-26 NOTE — Assessment & Plan Note (Addendum)
Ulcer present on the right 2nd toe that is pressing up against the great toe. Unclear etiology. Pt does not have diabetes. Pt has full sensation in his feet and toes and has good arterial blood flow. Differentials include mechanical foot ulcer vs exophytic bone lesion. - Will refer to orthopedic surgery for further evaluation - The ulcer has some white/yellow material present, so will give Doxycycline 100mg  qd x 10 days to cover for infection - Follow-up as needed

## 2015-04-26 NOTE — Progress Notes (Signed)
   [facility] Phone: [phone]  Subjective:  Right ulcer on toe: He has had an ulcer in between his right great toe and 2nd toe for 1 year, but it has worsened in the last week. It has burst open and is more painful. They do not know what color the drainage was. The ulcer has also gotten bigger in size. He has never had a foot ulcer before. He was referred to a foot doctor [facility]) last year and all they did was cut his toenails. No fevers or chills. No spreading redness. They have tried soaking the foot in epsom salt and have been keeping his feet well moisturized. The ulcer is preventing him from walking. They have tried putting a toe divider and cotton in between the toes, but it just makes it worse.   See HPI for pertinent ROS. Past Medical History- significant for HTN, prostate cancer, HLD, cerebral palsy Reviewed problem list.  Medications- reviewed and updated Current Outpatient Prescriptions  Medication Sig Dispense Refill  . amLODipine (NORVASC) 10 MG tablet take 1 tablet by mouth once daily 30 tablet 5  . aspirin 81 MG tablet Take 1 tablet (81 mg total) by mouth daily. 30 tablet 5  . lisinopril-hydrochlorothiazide (PRINZIDE,ZESTORETIC) 20-25 MG tablet take 1 tablet by mouth once daily 30 tablet 5  . metoprolol (LOPRESSOR) 100 MG tablet take 2 tablets by mouth twice a day 120 tablet 5  . potassium chloride SA (KLOR-CON M20) 20 MEQ tablet Take 2 tablets (40 mEq total) by mouth daily. 60 tablet 5  . simvastatin (ZOCOR) 80 MG tablet take 1 tablet by mouth at bedtime 30 tablet 5  . [DISCONTINUED] potassium chloride (KLOR-CON) 20 MEQ packet Take 40 mEq by mouth daily. 50 packet 3   No current facility-administered medications for this visit.   Chief complaint-noted Family history reviewed for today's visit. No changes. Social history- patient is a former smoker. Quit 20 years ago.  Objective: BP 137/78 mmHg  Pulse 79  Temp(Src) 98.1 F (36.7  C) (Oral) Gen: NAD, alert, cooperative with exam HEENT: NCAT, EOMI, MMM Neck: FROM, supple CV: 1+ DP pulses bilaterally Resp: Normal work of breathing Msk: Lower extremities are warm and well-perfused; 1cm x 1cm raised ulcer present on the DIP joint of the right 2nd toe that is pressing up against the right great toe; ulcer is exquisitely tender to palpation; white/yellow material present at the opening of the ulcer; no erythema of the surrounding skin; sensation intact in lower extremities bilaterally. Neuro: Alert and oriented, no gross deficits Skin: no rashes, no lesions, skin on feet is well moisturized Psych: Appropriate behavior  Assessment/Plan: See problem based a/p   Hyman Bible, MD PGY-1

## 2015-04-26 NOTE — Patient Instructions (Addendum)
It was so nice to see you again!  I have referred you to Orthopedic Surgery so they can examine your toe.  I have sent in a prescription for Doxycycline, which is an antibiotic. Please take this once a day for 10 days.  - Dr. Brett Albino

## 2015-05-02 ENCOUNTER — Encounter: Payer: Self-pay | Admitting: Podiatry

## 2015-05-02 ENCOUNTER — Ambulatory Visit (INDEPENDENT_AMBULATORY_CARE_PROVIDER_SITE_OTHER): Payer: Medicare Other | Admitting: Podiatry

## 2015-05-02 VITALS — BP 141/86 | HR 72 | Resp 16

## 2015-05-02 DIAGNOSIS — L84 Corns and callosities: Secondary | ICD-10-CM | POA: Diagnosis not present

## 2015-05-02 DIAGNOSIS — R0989 Other specified symptoms and signs involving the circulatory and respiratory systems: Secondary | ICD-10-CM

## 2015-05-02 MED ORDER — DOXYCYCLINE HYCLATE 100 MG PO TABS: 100.0000 mg | ORAL_TABLET | Freq: Two times a day (BID) | ORAL | Status: DC

## 2015-05-02 NOTE — Progress Notes (Signed)
   Subjective:    Patient ID: Rick Schwartz, male    DOB: 22-May-1940, 75 y.o.   MRN: [id number]  HPI 75 year old male presents the office in the members for concerns of the wound does right second toe which has been ongoing for several weeks. He was seen by his primary care physician placed on doxycycline and was referred to orthopedic surgery. Orthopedic surgery then referred him here for further evaluation. He is continued on the antibiotics and finishes tomorrow. His family member states this second toe has been somewhat dark in color but they've not noticed any drainage or warmth to the toe.   Review of Systems  All other systems reviewed and are negative.      Objective:   Physical Exam Awake, alert, NAD DP/PT pulses decreased Protective sensation appears to be decreased with Derrel Nip monofilament On the long the medial aspect of the right second toe DIPJ is a hyperkeratotic lesion. There is small amount of macerated tissue overlying this area as well with mild malodor. Upon debridement there is no underlying ulceration, drainage or other signs of infection. There is mild edema to the toenails hyperpigmentation of the toe as well. There is no fluctuance or crepitus. 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 No other open lesions or pre-ulcer lesions identified at this time. MMT 5/5, ROM WNL except bilateral 1st MTPJ There is no pain with calf compression, swelling, warmth, erythema.      Assessment & Plan:  75 year old male right second toe pre-ulcerative callus -Treatment options discussed including all alternatives, risks, and complications -Etiology of symptoms were discussed -X-rays and orthopedics were reviewed. There does appear to be chronic changes of the DIPJ of the right second toe. -Continue doxycycline for now. Refilled today. -Lesion was debrided without complications or bleeding today. -Will order arterial studies given decreased pulse is no special calcification  on x-ray. -Offloading pads were dispensed. -Monitor for any clinical signs or symptoms of infection and directed to call the office immediately should any occur or go to the ER. -Follow-up as scheduled or sooner if any problems arise. In the meantime, encouraged to call the office with any questions, concerns, change in symptoms.   Celesta Gentile, DPM

## 2015-05-03 ENCOUNTER — Telehealth: Payer: Self-pay | Admitting: *Deleted

## 2015-05-03 DIAGNOSIS — R0989 Other specified symptoms and signs involving the circulatory and respiratory systems: Secondary | ICD-10-CM

## 2015-05-04 ENCOUNTER — Ambulatory Visit (HOSPITAL_COMMUNITY)
Admission: RE | Admit: 2015-05-04 | Discharge: 2015-05-04 | Disposition: A | Discharge status: Home / Self Care | Payer: Medicare Other | Source: Ambulatory Visit | Attending: Cardiovascular Disease | Admitting: Cardiovascular Disease

## 2015-05-04 ENCOUNTER — Other Ambulatory Visit: Payer: Self-pay | Admitting: Podiatry

## 2015-05-04 DIAGNOSIS — I10 Essential (primary) hypertension: Secondary | ICD-10-CM | POA: Insufficient documentation

## 2015-05-04 DIAGNOSIS — R0989 Other specified symptoms and signs involving the circulatory and respiratory systems: Secondary | ICD-10-CM

## 2015-05-04 DIAGNOSIS — G809 Cerebral palsy, unspecified: Secondary | ICD-10-CM | POA: Insufficient documentation

## 2015-05-04 DIAGNOSIS — L97911 Non-pressure chronic ulcer of unspecified part of right lower leg limited to breakdown of skin: Secondary | ICD-10-CM | POA: Diagnosis not present

## 2015-05-04 DIAGNOSIS — I771 Stricture of artery: Secondary | ICD-10-CM | POA: Diagnosis not present

## 2015-05-04 DIAGNOSIS — I745 Embolism and thrombosis of iliac artery: Secondary | ICD-10-CM | POA: Insufficient documentation

## 2015-05-04 DIAGNOSIS — E785 Hyperlipidemia, unspecified: Secondary | ICD-10-CM | POA: Insufficient documentation

## 2015-05-04 DIAGNOSIS — I739 Peripheral vascular disease, unspecified: Secondary | ICD-10-CM | POA: Diagnosis not present

## 2015-05-04 DIAGNOSIS — R938 Abnormal findings on diagnostic imaging of other specified body structures: Secondary | ICD-10-CM | POA: Insufficient documentation

## 2015-05-06 ENCOUNTER — Encounter: Payer: Self-pay | Admitting: Cardiovascular Disease

## 2015-05-06 ENCOUNTER — Ambulatory Visit (INDEPENDENT_AMBULATORY_CARE_PROVIDER_SITE_OTHER): Payer: Medicare Other | Admitting: Cardiovascular Disease

## 2015-05-06 VITALS — BP 134/72 | HR 79 | Ht 66.0 in | Wt 162.0 lb

## 2015-05-06 DIAGNOSIS — L97519 Non-pressure chronic ulcer of other part of right foot with unspecified severity: Secondary | ICD-10-CM | POA: Diagnosis not present

## 2015-05-06 NOTE — Patient Instructions (Signed)
Medication Instructions:  Your physician recommends that you continue on your current medications as directed. Please refer to the Current Medication list given to you today.   Labwork: none  Testing/Procedures: none  Follow-Up: Your physician recommends that you schedule a follow-up appointment in: 3 months with Dr. Berry.    Any Other Special Instructions Will Be Listed Below (If Applicable).     If you need a refill on your cardiac medications before your next appointment, please call your pharmacy.   

## 2015-05-06 NOTE — Assessment & Plan Note (Signed)
Mr. Picardo was referred to me by Dr. Jacqualyn Posey, his podiatrist, for evaluation and treatment of a right second toe ulcer. He has a history of remote tobacco abuse, treated hypertension and hyperlipidemia. He does have a discolored right second toe in his intertriginous zone. He is minimally ambulatory and for the most part gets around by using a wheelchair. He did have Dopplers performed in our office 05/04/15 which revealed a right ABI 0.72 and a left upper and 67. He had a high-frequency signal in his right common iliac artery and an occluded right SFA with 1 vessel runoff via the posterior tibial.  He really denies claudication. His right second toe is somewhat painful but there is no open wound.

## 2015-05-06 NOTE — Progress Notes (Signed)
05/06/2015 Rick Schwartz   1940-07-15  [id number]  Primary Physician Evette Doffing, MD Primary Cardiologist: Lorretta Harp MD Renae Gloss   HPI:  Mr. Cranson is a 75 year old thin appearing single African-American male with cerebral palsy accompanied by his sister today. He was referred by Dr. Jacqualyn Posey, his podiatrist, for peripheral vascular evaluation because of a right second toe ulcer. His cardiovascular risk factor profile is notable for remote tobacco abuse and treated hypertension and hyperlipidemia. The patient lives alone and is currently wheelchair-bound. He had the onset of right second toe discomfort several weeks ago. Is somewhat discolored but I cannot appreciate skin breakdown in his intertriginous zone. He had arterial Dopplers performed in our office 05/04/15 revealing a right ABI 0.7 to the left upper and 67. He had a high-frequency signal in his right common iliac artery, occluded right SFA with 1 vessel runoff via the posterior tibial. At this point, I cannot appreciate an open wound/critical limb ischemia although there is some discoloration. I suspect revascularization would be quite involved with the most obvious. Being revascularization of his high-grade right common iliac artery. I do not think he is a candidate for femoropopliteal bypass grafting. I will see him back in 3 months for follow-up. The meantime, if Dr. Jacqualyn Posey feels that he would benefit from iliac intervention we will certainly pursue this.   Current Outpatient Prescriptions  Medication Sig Dispense Refill  . amLODipine (NORVASC) 10 MG tablet take 1 tablet by mouth once daily 30 tablet 5  . aspirin 81 MG tablet Take 1 tablet (81 mg total) by mouth daily. 30 tablet 5  . doxycycline (VIBRA-TABS) 100 MG tablet Take 1 tablet (100 mg total) by mouth 2 (two) times daily. 14 tablet 0  . lisinopril-hydrochlorothiazide (PRINZIDE,ZESTORETIC) 20-25 MG tablet take 1 tablet by mouth once daily 30 tablet 5    . metoprolol (LOPRESSOR) 100 MG tablet take 2 tablets by mouth twice a day 120 tablet 5  . simvastatin (ZOCOR) 80 MG tablet Take 80 mg by mouth at bedtime.  0  . [DISCONTINUED] potassium chloride (KLOR-CON) 20 MEQ packet Take 40 mEq by mouth daily. 50 packet 3   No current facility-administered medications for this visit.    No Known Allergies  Social History   Social History  . Marital Status: Single    Spouse Name: N/A  . Number of Children: N/A  . Years of Education: N/A   Occupational History  . Not on file.   Social History Main Topics  . Smoking status: Former Smoker    Quit date: 10/12/1994  . Smokeless tobacco: Never Used  . Alcohol Use: Yes     Comment: Occasional beer and gin  . Drug Use: No  . Sexual Activity: No   Other Topics Concern  . Not on file   Social History Narrative     Review of Systems: General: negative for chills, fever, night sweats or weight changes.  Cardiovascular: negative for chest pain, dyspnea on exertion, edema, orthopnea, palpitations, paroxysmal nocturnal dyspnea or shortness of breath Dermatological: negative for rash Respiratory: negative for cough or wheezing Urologic: negative for hematuria Abdominal: negative for nausea, vomiting, diarrhea, bright red blood per rectum, melena, or hematemesis Neurologic: negative for visual changes, syncope, or dizziness All other systems reviewed and are otherwise negative except as noted above.    Blood pressure 134/72, pulse 79, height 5\' 6"  (1.676 m), weight 162 lb (73.483 kg).  General appearance: alert and no distress Neck:  no adenopathy, no carotid bruit, no JVD, supple, symmetrical, trachea midline and thyroid not enlarged, symmetric, no tenderness/mass/nodules Lungs: clear to auscultation bilaterally Heart: regular rate and rhythm, S1, S2 normal, no murmur, click, rub or gallop Extremities: hhyperpigmented area on right second toe with no obvious skin breakdown  EKG not  performed today  ASSESSMENT AND PLAN:   Toe ulcer, right [facility]) Mr. Elmi was referred to me by Dr. Jacqualyn Posey, his podiatrist, for evaluation and treatment of a right second toe ulcer. He has a history of remote tobacco abuse, treated hypertension and hyperlipidemia. He does have a discolored right second toe in his intertriginous zone. He is minimally ambulatory and for the most part gets around by using a wheelchair. He did have Dopplers performed in our office 05/04/15 which revealed a right ABI 0.72 and a left upper and 67. He had a high-frequency signal in his right common iliac artery and an occluded right SFA with 1 vessel runoff via the posterior tibial.  He really denies claudication. His right second toe is somewhat painful but there is no open wound.      Lorretta Harp MD FACP,FACC,FAHA, [facility] 05/06/2015 11:37 AM

## 2015-05-17 ENCOUNTER — Encounter: Payer: Self-pay | Admitting: Podiatry

## 2015-05-17 ENCOUNTER — Ambulatory Visit (INDEPENDENT_AMBULATORY_CARE_PROVIDER_SITE_OTHER): Payer: Medicare Other | Admitting: Podiatry

## 2015-05-17 VITALS — BP 134/80 | HR 71 | Resp 18

## 2015-05-17 DIAGNOSIS — L84 Corns and callosities: Secondary | ICD-10-CM | POA: Diagnosis not present

## 2015-05-19 NOTE — Progress Notes (Signed)
Patient ID: Rick Schwartz, male   DOB: August 17, 1940, 75 y.o.   MRN: [id number]  Subjective: 75 year old male presents the office they for follow-up evaluation of right second toe ulceration, pre-ulcerative callus. He presents today with family members. States his been doing well although he does continue to get some discomfort to the area. Denies any swelling or redness or any drainage. Denies any systemic complaints such as fevers, chills, nausea, vomiting. No acute changes since last appointment, and no other complaints at this time.   Objective: AAO x3, NAD DP/PT pulses palpable bilaterally, CRT less than 3 seconds On the medial aspect of the right second toe at the level of the PIPJ is a hyperkeratotic lesion. Upon debridement there is no underlying ulceration, drainage or other signs of infection. There does appear to be decreased range of motion the first MTPJ and there is lateral deviation of the second toe at the level of the PIPJ. There is no edema, erythema, increase in warmth. There is no clinical signs of infection at this time. No other areas of tenderness or pain with vibratory sensatio No edema, erythema, increase in warmth to bilateral lower extremities.  No open lesions or pre-ulcerative lesions.  No pain with calf compression, swelling, warmth, erythema  Assessment: Hyperkeratotic lesion right second toe, noninfected  Plan: -All treatment options discussed with the patient including all alternatives, risks, complications.  -Lesion was debrided without complications or bleeding. Offloading pads were dispensed. Monitor for any further skin breakdown or any signs or symptoms of infection. -Follow-up in 3 months for routine care or sooner if any issues are to arise. -Patient encouraged to call the office with any questions, concerns, change in symptoms.   Celesta Gentile, DPM

## 2015-05-25 ENCOUNTER — Ambulatory Visit: Payer: Medicare Other | Admitting: Cardiovascular Disease

## 2015-07-04 NOTE — Telephone Encounter (Signed)
Entered in error

## 2015-08-05 ENCOUNTER — Encounter: Payer: Self-pay | Admitting: Cardiovascular Disease

## 2015-08-05 ENCOUNTER — Ambulatory Visit (INDEPENDENT_AMBULATORY_CARE_PROVIDER_SITE_OTHER): Payer: Medicare Other | Admitting: Cardiovascular Disease

## 2015-08-05 VITALS — BP 135/69 | HR 75 | Ht 66.0 in | Wt 164.0 lb

## 2015-08-05 DIAGNOSIS — L97511 Non-pressure chronic ulcer of other part of right foot limited to breakdown of skin: Secondary | ICD-10-CM | POA: Diagnosis not present

## 2015-08-05 NOTE — Patient Instructions (Signed)
Dr Berry recommends that you follow-up with him as needed. 

## 2015-08-05 NOTE — Progress Notes (Signed)
08/05/2015 Sueanne Margarita   [birth year]  [id number]  Primary Physician Evette Doffing, MD Primary Cardiologist: Lorretta Harp MD Renae Gloss   HPI:  Mr. Pittard is a 75 year old thin appearing single African-American male with cerebral palsy accompanied by his sister today. He was referred by Dr. Jacqualyn Posey, his podiatrist, for peripheral vascular evaluation because of a right second toe ulcer. I saw him in the office 05/06/15.His cardiovascular risk factor profile is notable for remote tobacco abuse and treated hypertension and hyperlipidemia. The patient lives alone and is currently wheelchair-bound. He had the onset of right second toe discomfort several weeks ago. Is somewhat discolored but I cannot appreciate skin breakdown in his intertriginous zone. He had arterial Dopplers performed in our office 05/04/15 revealing a right ABI 0.7 to the left upper and 67. He had a high-frequency signal in his right common iliac artery, occluded right SFA with 1 vessel runoff via the posterior tibial. At this point, I cannot appreciate an open wound/critical limb ischemia although there is some discoloration. I suspect revascularization would be quite involved with the most obvious. Being revascularization of his high-grade right common iliac artery. I do not think he is a candidate for femoropopliteal bypass grafting. Since I saw him 3 months ago his right second toe ulcer has since healed. He is asymptomatic. He has seen Dr. Jacqualyn Posey back since my initial office visit who agrees with that as well.  Current Outpatient Prescriptions  Medication Sig Dispense Refill  . amLODipine (NORVASC) 10 MG tablet take 1 tablet by mouth once daily 30 tablet 5  . aspirin 81 MG tablet Take 1 tablet (81 mg total) by mouth daily. 30 tablet 5  . lisinopril-hydrochlorothiazide (PRINZIDE,ZESTORETIC) 20-25 MG tablet take 1 tablet by mouth once daily 30 tablet 5  . metoprolol (LOPRESSOR) 100 MG tablet take 2 tablets by  mouth twice a day 120 tablet 5  . simvastatin (ZOCOR) 80 MG tablet Take 80 mg by mouth at bedtime.  0  . [DISCONTINUED] potassium chloride (KLOR-CON) 20 MEQ packet Take 40 mEq by mouth daily. 50 packet 3   No current facility-administered medications for this visit.    No Known Allergies  Social History   Social History  . Marital Status: Single    Spouse Name: N/A  . Number of Children: N/A  . Years of Education: N/A   Occupational History  . Not on file.   Social History Main Topics  . Smoking status: Former Smoker    Quit date: 10/12/1994  . Smokeless tobacco: Never Used  . Alcohol Use: Yes     Comment: Occasional beer and gin  . Drug Use: No  . Sexual Activity: No   Other Topics Concern  . Not on file   Social History Narrative     Review of Systems: General: negative for chills, fever, night sweats or weight changes.  Cardiovascular: negative for chest pain, dyspnea on exertion, edema, orthopnea, palpitations, paroxysmal nocturnal dyspnea or shortness of breath Dermatological: negative for rash Respiratory: negative for cough or wheezing Urologic: negative for hematuria Abdominal: negative for nausea, vomiting, diarrhea, bright red blood per rectum, melena, or hematemesis Neurologic: negative for visual changes, syncope, or dizziness All other systems reviewed and are otherwise negative except as noted above.    Blood pressure 135/69, pulse 75, height 5\' 6"  (1.676 m), weight 164 lb (74.39 kg), SpO2 96 %.  General appearance: alert and no distress Neck: no adenopathy, no carotid bruit, no JVD, supple,  symmetrical, trachea midline and thyroid not enlarged, symmetric, no tenderness/mass/nodules Lungs: clear to auscultation bilaterally Heart: regular rate and rhythm, S1, S2 normal, no murmur, click, rub or gallop Extremities: extremities normal, atraumatic, no cyanosis or edema  EKG not performed today  ASSESSMENT AND PLAN:   Toe ulcer, right ([place]) I  initially saw Mr. Grandville Silos for a right second toe ulcer which has since healed. He has no discomfort. He saw Dr. Earleen Newport a month after his visit with me and his toe was doing well. 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 At this point, I do not think any further vascular workup is required. He is not a candidate for intervention at this time since there is no evidence of critical limb ischemia. I will see him back when necessary.      Lorretta Harp MD FACP,FACC,FAHA, [facility] 08/05/2015 10:42 AM

## 2015-08-05 NOTE — Assessment & Plan Note (Signed)
I initially saw [name] for a right second toe ulcer which has since healed. He has no discomfort. He saw Dr. Earleen Newport a month after his visit with me and his toe was doing well. 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 At this point, I do not think any further vascular workup is required. He is not a candidate for intervention at this time since there is no evidence of critical limb ischemia. I will see him back when necessary.

## 2015-08-29 ENCOUNTER — Ambulatory Visit: Payer: Medicare Other | Admitting: Podiatry

## 2015-09-27 ENCOUNTER — Telehealth: Payer: Self-pay | Admitting: *Deleted

## 2015-09-27 ENCOUNTER — Encounter: Payer: Self-pay | Admitting: Internal Medicine

## 2015-09-27 NOTE — Telephone Encounter (Signed)
Pt sister calling in stating that the pt needs a note stating that he is handicap for jury duty. Please advise. Deseree Kennon Holter, CMA

## 2015-09-27 NOTE — Telephone Encounter (Signed)
Please let Mr. Janowski's daughter know that I have left the letter excusing Mr. Grandville Silos from jury duty at the front desk. Thank you!

## 2015-09-27 NOTE — Telephone Encounter (Signed)
Daughter is aware that letter is ready for pick up. Jazmin Hartsell,CMA

## 2015-10-16 ENCOUNTER — Other Ambulatory Visit: Payer: Self-pay | Admitting: Internal Medicine

## 2015-10-18 ENCOUNTER — Encounter: Payer: Self-pay | Admitting: *Deleted

## 2015-10-18 ENCOUNTER — Ambulatory Visit (INDEPENDENT_AMBULATORY_CARE_PROVIDER_SITE_OTHER): Payer: Medicare Other | Admitting: *Deleted

## 2015-10-18 VITALS — BP 127/70 | HR 73 | Temp 97.8°F | Ht 66.0 in | Wt 169.4 lb

## 2015-10-18 DIAGNOSIS — Z0000 Encounter for general adult medical examination without abnormal findings: Secondary | ICD-10-CM | POA: Diagnosis not present

## 2015-10-18 DIAGNOSIS — Z23 Encounter for immunization: Secondary | ICD-10-CM

## 2015-10-18 NOTE — Progress Notes (Signed)
Subjective:   Rick Schwartz is a 75 y.o. male who presents via wheelchair for an Initial Medicare Annual Wellness Visit accompanied by sister and family friend.  Cardiac Risk Factors include: advanced age (>71men, >69 women);dyslipidemia;family history of premature cardiovascular disease;hypertension;male gender;sedentary lifestyle    Objective:    Today's Vitals   10/18/15 1110  BP: 127/70  Pulse: 73  Temp: 97.8 F (36.6 C)  TempSrc: Oral  SpO2: 96%  Weight: 169 lb 6.4 oz (76.8 kg)  Height: 5\' 6"  (1.676 m)  PainSc: 0-No pain   Body mass index is 27.34 kg/m.  Current Medications (verified) Outpatient Encounter Prescriptions as of 10/18/2015  Medication Sig  . amLODipine (NORVASC) 10 MG tablet take 1 tablet by mouth once daily  . aspirin 81 MG tablet Take 1 tablet (81 mg total) by mouth daily.  [name] lisinopril-hydrochlorothiazide (PRINZIDE,ZESTORETIC) 20-25 MG tablet take 1 tablet by mouth once daily  . metoprolol (LOPRESSOR) 100 MG tablet take 2 tablets by mouth twice a day  . simvastatin (ZOCOR) 80 MG tablet Take 80 mg by mouth at bedtime.  . [DISCONTINUED] simvastatin (ZOCOR) 80 MG tablet take 1 tablet by mouth at bedtime   No facility-administered encounter medications on file as of 10/18/2015.     Allergies (verified) Review of patient's allergies indicates no known allergies.   History: Past Medical History:  Diagnosis Date  . [facility])    Prostate  . Cerebral palsy ([facility])   . Hyperlipidemia   . Hypertension   . Peripheral arterial disease ([place])    critical limb ischemia   Past Surgical History:  Procedure Laterality Date  . PROSTATE SURGERY     Family History  Problem Relation Age of Onset  . Diabetes Mother   . Hypertension Mother   . Hypertension Father   . Diabetes Sister   . Hypertension Sister   . Diabetes Brother   . Hypertension Brother   . Diabetes Sister   . Thyroid disease Sister   . Sudden death Brother    Social History    Occupational History  . Not on file.   Social History Main Topics  . Smoking status: Former Smoker    Packs/day: 0.25    Years: 35.00    Quit date: 10/12/1994  . Smokeless tobacco: Never Used  . Alcohol use Yes     Comment: Occasional beer and gin  . Drug use: No  . Sexual activity: No   Tobacco Counseling Counseling given: Yes   Activities of Daily Living In your present state of health, do you have any difficulty performing the following activities: 10/18/2015  Hearing? N  Vision? N  Difficulty concentrating or making decisions? N  Walking or climbing stairs? Y  Dressing or bathing? Y  Doing errands, shopping? Y  Preparing Food and eating ? Y  Using the Toilet? N  In the past six months, have you accidently leaked urine? N  Do you have problems with loss of bowel control? N  Managing your Medications? Y  Managing your Finances? Y  Housekeeping or managing your Housekeeping? Y  Some recent data might be hidden  Home Safety:  My home has a working smoke alarm:  Yes, one in each room           My home throw rugs have been fastened down to the floor or removed:  Yes I have non-slip mats in the bathtub and shower:  Yes         All  my home's stairs have railings or bannisters: Yes         My home's floors, stairs and hallways are free from clutter, wires and cords:  Yes        Immunizations and Health Maintenance Immunization History  Administered Date(s) Administered  . Influenza Split 12/25/2010, 11/28/2011  . Influenza Whole 01/02/2007, 01/27/2010  . Influenza, High Dose Seasonal PF 01/11/2015  . Influenza,inj,Quad PF,36+ Mos 01/05/2013, 01/25/2014  . Pneumococcal Polysaccharide-23 10/17/1997, 11/28/2011  . Td 10/18/1998   Health Maintenance Due  Topic Date Due  . ZOSTAVAX  04/29/2001  . PNA vac Low Risk Adult (2 of 2 - PCV13) 11/27/2012  . INFLUENZA VACCINE  10/18/2015   Prevnar-13 given today Patient will receive zostavax at [facility]  Patient  Care Team: Sela Hua, MD as PCP - General (Nurse Practitioner)  Indicate any recent Medical Services you may have received from other than [facility] providers in the past year (date may be approximate).    Assessment:   This is a routine wellness examination for [name].   Hearing/Vision screen  Hearing Screening   125Hz  250Hz  500Hz  1000Hz  2000Hz  3000Hz  4000Hz  6000Hz  8000Hz   Right ear:   40 40 40  40    Left ear:   40 40 40  40      Dietary issues and exercise activities discussed: Current Exercise Habits: The patient does not participate in regular exercise at present, Exercise limited by: neurologic condition(s);orthopedic condition(s)  Goals    None    Blood pressure < 140/90 Depression Screen PHQ 2/9 Scores 10/18/2015 04/26/2015 10/12/2014 01/25/2014  PHQ - 2 Score 0 0 0 0    Fall Risk Fall Risk  10/18/2015 04/26/2015 10/12/2014 01/25/2014 01/05/2013  Falls in the past year? No No No No No  Risk for fall due to : Impaired balance/gait;Impaired mobility - Impaired mobility;Impaired vision - Impaired balance/gait;Impaired mobility;Other (Comment)  Risk for fall due to (comments): - - - - cerebral palsy    Cognitive Function: Mini-Cog failed with score 2/5  TUG Test:  Unable to perform due to wheelchair (cerebral palsy)  Screening Tests Health Maintenance  Topic Date Due  . ZOSTAVAX  04/29/2001  . PNA vac Low Risk Adult (2 of 2 - PCV13) 11/27/2012  . INFLUENZA VACCINE  10/18/2015  . TETANUS/TDAP  08/29/2020  . COLONOSCOPY  08/18/2023        Plan:     During the course of the visit [name] was educated and counseled about the following appropriate screening and preventive services:   Vaccines to include Pneumoccal, Influenza, Td, Zostavax  Colorectal cancer screening  Cardiovascular disease screening  Diabetes screening  Nutrition counseling  Patient Instructions (the written plan) were given to the patient.   [name], RN   10/18/2015      I have  reviewed this visit and discussed with [name], RN, BSN, and agree with her documentation.   Hyman Bible, MD PGY-2

## 2015-10-18 NOTE — Patient Instructions (Signed)
 Fall Prevention in the Home  Falls can cause injuries. They can happen to people of all ages. There are many things you can do to make your home safe and to help prevent falls.  WHAT CAN I DO ON THE OUTSIDE OF MY HOME?  Regularly fix the edges of walkways and driveways and fix any cracks.  Remove anything that might make you trip as you walk through a door, such as a raised step or threshold.  Trim any bushes or trees on the path to your home.  Use bright outdoor lighting.  Clear any walking paths of anything that might make someone trip, such as rocks or tools.  Regularly check to see if handrails are loose or broken. Make sure that both sides of any steps have handrails.  Any raised decks and porches should have guardrails on the edges.  Have any leaves, snow, or ice cleared regularly.  Use sand or salt on walking paths during winter.  Clean up any spills in your garage right away. This includes oil or grease spills. WHAT CAN I DO IN THE BATHROOM?   Use night lights.  Install grab bars by the toilet and in the tub and shower. Do not use towel bars as grab bars.  Use non-skid mats or decals in the tub or shower.  If you need to sit down in the shower, use a plastic, non-slip stool.  Keep the floor dry. Clean up any water that spills on the floor as soon as it happens.  Remove soap buildup in the tub or shower regularly.  Attach bath mats securely with double-sided non-slip rug tape.  Do not have throw rugs and other things on the floor that can make you trip. WHAT CAN I DO IN THE BEDROOM?  Use night lights.  Make sure that you have a light by your bed that is easy to reach.  Do not use any sheets or blankets that are too big for your bed. They should not hang down onto the floor.  Have a firm chair that has side arms. You can use this for support while you get dressed.  Do not have throw rugs and other things on the floor that can make you trip. WHAT CAN I DO  IN THE KITCHEN?  Clean up any spills right away.  Avoid walking on wet floors.  Keep items that you use a lot in easy-to-reach places.  If you need to reach something above you, use a strong step stool that has a grab bar.  Keep electrical cords out of the way.  Do not use floor polish or wax that makes floors slippery. If you must use wax, use non-skid floor wax.  Do not have throw rugs and other things on the floor that can make you trip. WHAT CAN I DO WITH MY STAIRS?  Do not leave any items on the stairs.  Make sure that there are handrails on both sides of the stairs and use them. Fix handrails that are broken or loose. Make sure that handrails are as long as the stairways.  Check any carpeting to make sure that it is firmly attached to the stairs. Fix any carpet that is loose or worn.  Avoid having throw rugs at the top or bottom of the stairs. If you do have throw rugs, attach them to the floor with carpet tape.  Make sure that you have a light switch at the top of the stairs and the bottom of the   stairs. If you do not have them, ask someone to add them for you. WHAT ELSE CAN I DO TO HELP PREVENT FALLS?  Wear shoes that:  Do not have high heels.  Have rubber bottoms.  Are comfortable and fit you well.  Are closed at the toe. Do not wear sandals.  If you use a stepladder:  Make sure that it is fully opened. Do not climb a closed stepladder.  Make sure that both sides of the stepladder are locked into place.  Ask someone to hold it for you, if possible.  Clearly mark and make sure that you can see:  Any grab bars or handrails.  First and last steps.  Where the edge of each step is.  Use tools that help you move around (mobility aids) if they are needed. These include:  Canes.  Walkers.  Scooters.  Crutches.  Turn on the lights when you go into a dark area. Replace any light bulbs as soon as they burn out.  Set up your furniture so you have a clear  path. Avoid moving your furniture around.  If any of your floors are uneven, fix them.  If there are any pets around you, be aware of where they are.  Review your medicines with your doctor. Some medicines can make you feel dizzy. This can increase your chance of falling. Ask your doctor what other things that you can do to help prevent falls.   This information is not intended to replace advice given to you by your health care provider. Make sure you discuss any questions you have with your health care provider.   Document Released: 12/30/2008 Document Revised: 07/20/2014 Document Reviewed: 04/09/2014 Elsevier Interactive Patient Education 2016 Elsevier Inc.  Health Maintenance, Male A healthy lifestyle and preventative care can promote health and wellness.  Maintain regular health, dental, and eye exams.  Eat a healthy diet. Foods like vegetables, fruits, whole grains, low-fat dairy products, and lean protein foods contain the nutrients you need and are low in calories. Decrease your intake of foods high in solid fats, added sugars, and salt. Get information about a proper diet from your health care provider, if necessary.  Regular physical exercise is one of the most important things you can do for your health. Most adults should get at least 150 minutes of moderate-intensity exercise (any activity that increases your heart rate and causes you to sweat) each week. In addition, most adults need muscle-strengthening exercises on 2 or more days a week.   Maintain a healthy weight. The body mass index (BMI) is a screening tool to identify possible weight problems. It provides an estimate of body fat based on height and weight. Your health care provider can find your BMI and can help you achieve or maintain a healthy weight. For males 20 years and older:  A BMI below 18.5 is considered underweight.  A BMI of 18.5 to 24.9 is normal.  A BMI of 25 to 29.9 is considered overweight.  A BMI  of 30 and above is considered obese.  Maintain normal blood lipids and cholesterol by exercising and minimizing your intake of saturated fat. Eat a balanced diet with plenty of fruits and vegetables. Blood tests for lipids and cholesterol should begin at age 20 and be repeated every 5 years. If your lipid or cholesterol levels are high, you are over age 50, or you are at high risk for heart disease, you may need your cholesterol levels checked more frequently.Ongoing high lipid   and cholesterol levels should be treated with medicines if diet and exercise are not working.  If you smoke, find out from your health care provider how to quit. If you do not use tobacco, do not start.  Lung cancer screening is recommended for adults aged 55-80 years who are at high risk for developing lung cancer because of a history of smoking. A yearly low-dose CT scan of the lungs is recommended for people who have at least a 30-pack-year history of smoking and are current smokers or have quit within the past 15 years. A pack year of smoking is smoking an average of 1 pack of cigarettes a day for 1 year (for example, a 30-pack-year history of smoking could mean smoking 1 pack a day for 30 years or 2 packs a day for 15 years). Yearly screening should continue until the smoker has stopped smoking for at least 15 years. Yearly screening should be stopped for people who develop a health problem that would prevent them from having lung cancer treatment.  If you choose to drink alcohol, do not have more than 2 drinks per day. One drink is considered to be 12 oz (360 mL) of beer, 5 oz (150 mL) of wine, or 1.5 oz (45 mL) of liquor.  Avoid the use of street drugs. Do not share needles with anyone. Ask for help if you need support or instructions about stopping the use of drugs.  High blood pressure causes heart disease and increases the risk of stroke. High blood pressure is more likely to develop in:  People who have blood  pressure in the end of the normal range (100-139/85-89 mm Hg).  People who are overweight or obese.  People who are African American.  If you are 18-39 years of age, have your blood pressure checked every 3-5 years. If you are 40 years of age or older, have your blood pressure checked every year. You should have your blood pressure measured twice--once when you are at a hospital or clinic, and once when you are not at a hospital or clinic. Record the average of the two measurements. To check your blood pressure when you are not at a hospital or clinic, you can use:  An automated blood pressure machine at a pharmacy.  A home blood pressure monitor.  If you are 45-79 years old, ask your health care provider if you should take aspirin to prevent heart disease.  Diabetes screening involves taking a blood sample to check your fasting blood sugar level. This should be done once every 3 years after age 45 if you are at a normal weight and without risk factors for diabetes. Testing should be considered at a younger age or be carried out more frequently if you are overweight and have at least 1 risk factor for diabetes.  Colorectal cancer can be detected and often prevented. Most routine colorectal cancer screening begins at the age of 50 and continues through age 75. However, your health care provider may recommend screening at an earlier age if you have risk factors for colon cancer. On a yearly basis, your health care provider may provide home test kits to check for hidden blood in the stool. A small camera at the end of a tube may be used to directly examine the colon (sigmoidoscopy or colonoscopy) to detect the earliest forms of colorectal cancer. Talk to your health care provider about this at age 50 when routine screening begins. A direct exam of the colon should be   repeated every 5-10 years through age 75, unless early forms of precancerous polyps or small growths are found.  People who are at an  increased risk for hepatitis B should be screened for this virus. You are considered at high risk for hepatitis B if:  You were born in a country where hepatitis B occurs often. Talk with your health care provider about which countries are considered high risk.  Your parents were born in a high-risk country and you have not received a shot to protect against hepatitis B (hepatitis B vaccine).  You have HIV or AIDS.  You use needles to inject street drugs.  You live with, or have sex with, someone who has hepatitis B.  You are a man who has sex with other men (MSM).  You get hemodialysis treatment.  You take certain medicines for conditions like cancer, organ transplantation, and autoimmune conditions.  Hepatitis C blood testing is recommended for all people born from 1945 through 1965 and any individual with known risk factors for hepatitis C.  Healthy men should no longer receive prostate-specific antigen (PSA) blood tests as part of routine cancer screening. Talk to your health care provider about prostate cancer screening.  Testicular cancer screening is not recommended for adolescents or adult males who have no symptoms. Screening includes self-exam, a health care provider exam, and other screening tests. Consult with your health care provider about any symptoms you have or any concerns you have about testicular cancer.  Practice safe sex. Use condoms and avoid high-risk sexual practices to reduce the spread of sexually transmitted infections (STIs).  You should be screened for STIs, including gonorrhea and chlamydia if:  You are sexually active and are younger than 24 years.  You are older than 24 years, and your health care provider tells you that you are at risk for this type of infection.  Your sexual activity has changed since you were last screened, and you are at an increased risk for chlamydia or gonorrhea. Ask your health care provider if you are at risk.  If you are at  risk of being infected with HIV, it is recommended that you take a prescription medicine daily to prevent HIV infection. This is called pre-exposure prophylaxis (PrEP). You are considered at risk if:  You are a man who has sex with other men (MSM).  You are a heterosexual man who is sexually active with multiple partners.  You take drugs by injection.  You are sexually active with a partner who has HIV.  Talk with your health care provider about whether you are at high risk of being infected with HIV. If you choose to begin PrEP, you should first be tested for HIV. You should then be tested every 3 months for as long as you are taking PrEP.  Use sunscreen. Apply sunscreen liberally and repeatedly throughout the day. You should seek shade when your shadow is shorter than you. Protect yourself by wearing long sleeves, pants, a wide-brimmed hat, and sunglasses year round whenever you are outdoors.  Tell your health care provider of new moles or changes in moles, especially if there is a change in shape or color. Also, tell your health care provider if a mole is larger than the size of a pencil eraser.  A one-time screening for abdominal aortic aneurysm (AAA) and surgical repair of large AAAs by ultrasound is recommended for men aged 65-75 years who are current or former smokers.  Stay current with your vaccines (immunizations).     This information is not intended to replace advice given to you by your health care provider. Make sure you discuss any questions you have with your health care provider.   Document Released: 09/01/2007 Document Revised: 03/26/2014 Document Reviewed: 07/31/2010 Elsevier Interactive Patient Education 2016 Elsevier Inc.  

## 2016-04-15 ENCOUNTER — Other Ambulatory Visit: Payer: Self-pay | Admitting: Internal Medicine

## 2016-07-20 ENCOUNTER — Ambulatory Visit (INDEPENDENT_AMBULATORY_CARE_PROVIDER_SITE_OTHER): Payer: Medicare Other | Admitting: Internal Medicine

## 2016-07-20 ENCOUNTER — Encounter: Payer: Self-pay | Admitting: Internal Medicine

## 2016-07-20 VITALS — BP 130/78 | HR 73 | Temp 99.1°F

## 2016-07-20 DIAGNOSIS — E782 Mixed hyperlipidemia: Secondary | ICD-10-CM

## 2016-07-20 DIAGNOSIS — I10 Essential (primary) hypertension: Secondary | ICD-10-CM | POA: Diagnosis not present

## 2016-07-20 DIAGNOSIS — R7303 Prediabetes: Secondary | ICD-10-CM

## 2016-07-20 LAB — POCT GLYCOSYLATED HEMOGLOBIN (HGB A1C): Hemoglobin A1C: 6.4

## 2016-07-20 NOTE — Assessment & Plan Note (Signed)
Well-controlled. BP 130/78 in clinic today. - Continue Norvasc 10mg  daily, Lisinopril-HCTZ 20-25mg  daily, and Lopressor 200mg  bid - Could consider stopping Norvasc or decreasing the dose in the future due to his lower extremity swelling - Check BMET since he is on Lisinopril-HCTZ - Follow-up in 6 months

## 2016-07-20 NOTE — Assessment & Plan Note (Signed)
Well-controlled. Last A1c 5.9%.  - Nutrition counseling performed today - Will check A1c

## 2016-07-20 NOTE — Progress Notes (Signed)
   [facility] Phone: [phone]  Subjective:  Rick Schwartz is a 76 year old male presenting to clinic for follow-up of chronic medical conditions.  HTN: Does not check blood pressures at home. Taking Norvasc, Lisinopril-HCTZ, and Metoprolol. No side effects. No chest pain, no dizziness, no shortness of breath. Endorses mild lower extremity edema.  Pre-diabetes: Not checking blood sugars at home or taking any medications. Eating mostly baked and broiled meats. Only eats out twice per month. Drinks low sugar juices. Only drinks sodas occasionally.  HLD: Taking Zocor. No side effects. No muscle aches. No RUQ pain.  ROS: See HPI for pertinent positives and negatives  Past Medical History- HTN, cerebral palsy, hx prostate cancer, HLD  Family history reviewed for today's visit. No changes.  Social history- patient is a former smoker  Objective: BP 130/78   Pulse 73   Temp 99.1 F (37.3 C) (Oral)   SpO2 96%  Gen: NAD, alert, cooperative with exam HEENT: NCAT, EOMI, MMM Neck: FROM, supple CV: RRR, no murmur Resp: CTABL, no wheezes, normal work of breathing Msk: 1+ pitting edema in the feet/ankles bilaterally, RUE is contracted and pressed against the chest. Neuro: Alert and oriented, no gross deficits Skin: No rashes, no lesions Psych: Appropriate behavior  Assessment/Plan: HTN: Well-controlled. BP 130/78 in clinic today. - Continue Norvasc 10mg  daily, Lisinopril-HCTZ 20-25mg  daily, and Lopressor 200mg  bid - Could consider stopping Norvasc or decreasing the dose in the future due to his lower extremity swelling - Check BMET since he is on Lisinopril-HCTZ - Follow-up in 6 months  Pre-diabetes: Well-controlled. Last A1c 5.9%.  - Nutrition counseling performed today - Will check A1c  HLD: Well-controlled. Last lipid panel in 2016: Chol 118, HDL 34, LDL 60, TG 122. - Continue Zocor 80mg  daily - Repeat lipid panel   Hyman Bible, MD PGY-2

## 2016-07-20 NOTE — Assessment & Plan Note (Signed)
Well-controlled. Last lipid panel in 2016: Chol 118, HDL 34, LDL 60, TG 122. - Continue Zocor 80mg  daily - Repeat lipid panel

## 2016-07-20 NOTE — Patient Instructions (Signed)
It was so nice to see you!  Everything looks great today. Your exam was normal.  We will check some labs today- blood sugar, electrolytes, and cholesterol. I will call you with these results.  We will see you back in 6 months or earlier if needed.  -Dr. Brett Albino

## 2016-07-21 LAB — BASIC METABOLIC PANEL
BUN / CREAT RATIO: 16 (ref 10–24)
BUN: 15 mg/dL (ref 8–27)
CO2: 18 mmol/L (ref 18–29)
Calcium: 9.8 mg/dL (ref 8.6–10.2)
Chloride: 100 mmol/L (ref 96–106)
Creatinine, Ser: 0.94 mg/dL (ref 0.76–1.27)
GFR, EST AFRICAN AMERICAN: 91 mL/min/{1.73_m2} (ref >59)
GFR, EST NON AFRICAN AMERICAN: 79 mL/min/{1.73_m2} (ref >59)
Glucose: 119 mg/dL — ABNORMAL HIGH (ref 65–99)
POTASSIUM: 3.9 mmol/L (ref 3.5–5.2)
SODIUM: 139 mmol/L (ref 134–144)

## 2016-07-21 LAB — LIPID PANEL
CHOL/HDL RATIO: 2.9 ratio (ref 0.0–5.0)
Cholesterol, Total: 119 mg/dL (ref 100–199)
HDL: 41 mg/dL (ref >39)
LDL Calculated: 59 mg/dL (ref 0–99)
Triglycerides: 94 mg/dL (ref 0–149)
VLDL Cholesterol Cal: 19 mg/dL (ref 5–40)

## 2016-07-23 ENCOUNTER — Telehealth: Payer: Self-pay | Admitting: *Deleted

## 2016-07-23 NOTE — Telephone Encounter (Signed)
-----   Message from Sela Hua, MD sent at 07/23/2016  2:06 PM EDT ----- Please let Mr. Blazier know that his labs were normal. No changes to his current meds. Thanks!

## 2016-07-23 NOTE — Telephone Encounter (Signed)
Spoke with sister patricia and she is aware of results and will inform patient. Konstance Happel,CMA

## 2016-10-12 ENCOUNTER — Other Ambulatory Visit: Payer: Self-pay | Admitting: Internal Medicine

## 2017-03-20 ENCOUNTER — Telehealth: Payer: Self-pay | Admitting: Internal Medicine

## 2017-03-20 NOTE — Telephone Encounter (Signed)
Disability placard form dropped off for at front desk for completion.  Verified that patient section of form has been completed.  Last DOS/WCC with PCP was 07/20/16.  Placed form in blue team folder to be completed by clinical staff.  [name]

## 2017-03-20 NOTE — Telephone Encounter (Signed)
Clinical info completed on handicap form.  Place form in Dr. Mayo's box for completion.  Shelanda Duvall,  Timoth Schara, CMA   

## 2017-03-22 NOTE — Telephone Encounter (Signed)
Patient notified that form is ready for pick up in front office. Copy placed in scan box. Hubbard Hartshorn, RN, BSN

## 2017-03-22 NOTE — Telephone Encounter (Signed)
Form completed and placed in RN office.

## 2017-04-19 ENCOUNTER — Other Ambulatory Visit: Payer: Self-pay | Admitting: Internal Medicine

## 2017-06-10 ENCOUNTER — Other Ambulatory Visit: Payer: Self-pay

## 2017-06-10 ENCOUNTER — Ambulatory Visit (INDEPENDENT_AMBULATORY_CARE_PROVIDER_SITE_OTHER): Payer: Medicare Other | Admitting: Internal Medicine

## 2017-06-10 ENCOUNTER — Encounter: Payer: Self-pay | Admitting: Internal Medicine

## 2017-06-10 VITALS — BP 132/80 | HR 84 | Temp 99.8°F | Ht 66.0 in | Wt 157.6 lb

## 2017-06-10 DIAGNOSIS — G809 Cerebral palsy, unspecified: Secondary | ICD-10-CM

## 2017-06-10 DIAGNOSIS — E7800 Pure hypercholesterolemia, unspecified: Secondary | ICD-10-CM

## 2017-06-10 DIAGNOSIS — R7303 Prediabetes: Secondary | ICD-10-CM

## 2017-06-10 DIAGNOSIS — G802 Spastic hemiplegic cerebral palsy: Secondary | ICD-10-CM | POA: Diagnosis not present

## 2017-06-10 DIAGNOSIS — I10 Essential (primary) hypertension: Secondary | ICD-10-CM

## 2017-06-10 LAB — POCT GLYCOSYLATED HEMOGLOBIN (HGB A1C): Hemoglobin A1C: 6

## 2017-06-10 NOTE — Assessment & Plan Note (Signed)
Well-controlled. Last lipid panel 07/2016 with LDL 59. - Continue Zocor 80mg  daily - Check lipid panel - Follow-up in 1 year

## 2017-06-10 NOTE — Assessment & Plan Note (Signed)
Stable. Last A1c 6.0% in 07/2016. - Check A1c - Follow-up in 1 year

## 2017-06-10 NOTE — Assessment & Plan Note (Signed)
Well-controlled. BP 132/80. - Continue Norvasc 10mg  daily, Lisinopril-HCTZ 20-25mg  daily, and Lopressor 200mg  bid - Check BMP - Follow-up in 1 year

## 2017-06-10 NOTE — Assessment & Plan Note (Signed)
Stable. Family requesting order for new wheelchair, as their current wheelchair is broken. Patient unable to performed ADLs and IADLs independently at home. - DME order for wheelchair faxed to [facility].

## 2017-06-10 NOTE — Patient Instructions (Addendum)
It was so nice to see you today!  I will order a wheelchair and send it into [place]. Please let me know if there are any issues getting the wheelchair.  I have ordered some basic labs today and I will call you with these results.  -Dr. Brett Albino

## 2017-06-10 NOTE — Progress Notes (Signed)
   [facility] Phone: [phone]  Subjective:  Rick Schwartz is a 77 year old male presenting to clinic for follow-up of his HTN, HLD, prediabetes, and cerebral palsy.  HTN: Checking BP once every couple of weeks. Have been in the 124P-809X systolics. No side effects to meds. No chest pain, no shortness of breath, no lower extremity edema, no dizziness.  HLD: Taking Zocor daily. No RUQ pain, no muscle pain.  Prediabetes: Not taking any medications for this. Controlled with diet and exercise. No polyuria, no polydipsia.  Cerebral Palsy: Family states patient needs new wheelchair. Currently using a wheelchair that was given to him. The wheels don't have tread and don't lock. There are no foot rests. Patient is unable to walk due to his cerebral palsy. Can hold up some of his own weight during transfers. Family has to assist him with bathing, toileting, and grooming. Able to eat on his own.  ROS: See HPI for pertinent positives and negatives  Past Medical History- HTN, cerebral palsy, hx prostate cancer, HLD, prediabetes  Family history reviewed for today's visit. No changes.  Social history- patient is a never smoker  Objective: BP 132/80   Pulse 84   Temp 99.8 F (37.7 C) (Oral)   Ht 5\' 6"  (1.676 m)   Wt 157 lb 9.6 oz (71.5 kg)   SpO2 96%   BMI 25.44 kg/m  Gen: NAD, alert, cooperative with exam HEENT: NCAT, EOMI, MMM Neck: FROM, supple CV: RRR, no murmur Resp: CTABL, no wheezes, normal work of breathing GI: SNTND, BS present, no guarding or organomegaly Msk: contracted right arm and hand that is held closely against chest; lower extremities are non-edematous, warm and well-perfused, muscle atrophy present. Neuro: dysarthria present Skin: No rashes, no lesions Psych: Appropriate behavior  Assessment/Plan: HTN: Well-controlled. BP 132/80. - Continue Norvasc 10mg  daily, Lisinopril-HCTZ 20-25mg  daily, and Lopressor 200mg  bid - Check BMP - Follow-up in 1  year  HLD: Well-controlled. Last lipid panel 07/2016 with LDL 59. - Continue Zocor 80mg  daily - Check lipid panel - Follow-up in 1 year  Prediabetes: Stable. Last A1c 6.0% in 07/2016. - Check A1c - Follow-up in 1 year  Cerebral Palsy: Stable. Family requesting order for new wheelchair, as their current wheelchair is broken. Patient unable to performed ADLs and IADLs independently at home. - DME order for wheelchair faxed to [facility].   Hyman Bible, MD PGY-3

## 2017-06-11 LAB — BASIC METABOLIC PANEL
BUN / CREAT RATIO: 16 (ref 10–24)
BUN: 13 mg/dL (ref 8–27)
CHLORIDE: 99 mmol/L (ref 96–106)
CO2: 21 mmol/L (ref 20–29)
Calcium: 9.8 mg/dL (ref 8.6–10.2)
Creatinine, Ser: 0.82 mg/dL (ref 0.76–1.27)
GFR calc Af Amer: 99 mL/min/{1.73_m2} (ref >59)
GFR calc non Af Amer: 86 mL/min/{1.73_m2} (ref >59)
GLUCOSE: 111 mg/dL — AB (ref 65–99)
Potassium: 3.4 mmol/L — ABNORMAL LOW (ref 3.5–5.2)
SODIUM: 137 mmol/L (ref 134–144)

## 2017-06-11 LAB — LIPID PANEL
CHOLESTEROL TOTAL: 137 mg/dL (ref 100–199)
Chol/HDL Ratio: 3 ratio (ref 0.0–5.0)
HDL: 46 mg/dL (ref >39)
LDL Calculated: 67 mg/dL (ref 0–99)
TRIGLYCERIDES: 121 mg/dL (ref 0–149)
VLDL Cholesterol Cal: 24 mg/dL (ref 5–40)

## 2017-06-14 ENCOUNTER — Encounter: Payer: Self-pay | Admitting: Internal Medicine

## 2017-06-14 ENCOUNTER — Telehealth: Payer: Self-pay | Admitting: Internal Medicine

## 2017-06-14 NOTE — Telephone Encounter (Signed)
Called patient to discuss lab results. No answer and unable to leave voicemail. Will send letter to patient's house.  Hyman Bible, MD PGY-3

## 2017-06-17 ENCOUNTER — Telehealth: Payer: Self-pay

## 2017-06-17 ENCOUNTER — Other Ambulatory Visit: Payer: Self-pay | Admitting: Internal Medicine

## 2017-06-17 DIAGNOSIS — G809 Cerebral palsy, unspecified: Secondary | ICD-10-CM

## 2017-06-17 NOTE — Telephone Encounter (Signed)
Order for wheelchair was faxed to [facility] last week. Will print off another order and place it in the RN folder to be faxed to [facility]. Thanks!

## 2017-06-17 NOTE — Telephone Encounter (Signed)
Pt family Mardene Celeste calling to see if Dr. Brett Albino faxed order for patients wheelchair. Advised per her note she did send fax 06/10/17.  Wallace Cullens, RN

## 2017-06-17 NOTE — Telephone Encounter (Signed)
Pt's family Patricia-calling- [facility]. [facility] states they have not received an order for patients wheelchair.  Will forward to MD.

## 2017-06-18 NOTE — Telephone Encounter (Signed)
Order faxed to [facility]. Wallace Cullens, RN

## 2017-06-25 ENCOUNTER — Other Ambulatory Visit: Payer: Self-pay | Admitting: Internal Medicine

## 2017-06-25 ENCOUNTER — Telehealth: Payer: Self-pay | Admitting: Internal Medicine

## 2017-06-25 DIAGNOSIS — G809 Cerebral palsy, unspecified: Secondary | ICD-10-CM

## 2017-06-25 NOTE — Telephone Encounter (Signed)
Will forward to MD to write DME for this.  Jazmin Hartsell,CMA

## 2017-06-25 NOTE — Telephone Encounter (Signed)
I have faxed this over to [facility] twice now. I'm not sure what the issue is. I will attempt to send a third DME order again today.

## 2017-06-25 NOTE — Telephone Encounter (Signed)
Will forward to RN team to check on this DME order (in rn box). Jazmin Hartsell,CMA

## 2017-06-25 NOTE — Telephone Encounter (Signed)
Pt's emergency contact came in office requesting a Rx to get a wheelchair for the Pt. Best phone # to call is  [phone].  Please advise.

## 2017-06-28 NOTE — Telephone Encounter (Signed)
Sent message to [facility] about this request. Return message stated that they have not received however will pull the info now and will fax a compliant rx and narrative for the Doctor to complete and fax back to [phone].  Danley Danker, RN [facility] RN)

## 2017-07-10 NOTE — Telephone Encounter (Signed)
I have not received any paperwork from [facility].

## 2017-07-10 NOTE — Telephone Encounter (Signed)
Message to PCP to inquire if she has received a fax from [facility] with a compliant prescription and narrative to be completed for wheelchair. Patient has still not heard from [facility].  Danley Danker, RN [facility] RN)

## 2017-07-11 NOTE — Telephone Encounter (Signed)
Heard back from [facility] that this is all complete and wheelchair is ready for patient. They have left the patient a message a few days ago and will reach out again today. Danley Danker, RN [facility] RN)

## 2017-10-21 ENCOUNTER — Other Ambulatory Visit: Payer: Self-pay

## 2017-10-21 MED ORDER — LISINOPRIL-HYDROCHLOROTHIAZIDE 20-25 MG PO TABS: 1.0000 | ORAL_TABLET | Freq: Every day | ORAL | 5 refills | Status: DC

## 2017-10-21 MED ORDER — METOPROLOL TARTRATE 100 MG PO TABS: 200.0000 mg | ORAL_TABLET | Freq: Two times a day (BID) | ORAL | 5 refills | Status: DC

## 2017-10-21 MED ORDER — SIMVASTATIN 80 MG PO TABS: 80.0000 mg | ORAL_TABLET | Freq: Every day | ORAL | 5 refills | Status: DC

## 2017-10-21 MED ORDER — AMLODIPINE BESYLATE 10 MG PO TABS
10.0000 mg | ORAL_TABLET | Freq: Every day | ORAL | 5 refills | Status: DC
Start: 2017-10-21 — End: 2018-06-16

## 2018-01-27 ENCOUNTER — Other Ambulatory Visit: Payer: Self-pay

## 2018-01-27 MED ORDER — SIMVASTATIN 80 MG PO TABS: 80.0000 mg | ORAL_TABLET | Freq: Every day | ORAL | 2 refills | Status: DC

## 2018-01-27 MED ORDER — LISINOPRIL-HYDROCHLOROTHIAZIDE 20-25 MG PO TABS: 1.0000 | ORAL_TABLET | Freq: Every day | ORAL | 2 refills | Status: DC

## 2018-06-13 ENCOUNTER — Other Ambulatory Visit: Payer: Self-pay

## 2018-06-14 MED ORDER — METOPROLOL TARTRATE 100 MG PO TABS: 200.0000 mg | ORAL_TABLET | Freq: Two times a day (BID) | ORAL | 3 refills | Status: DC

## 2018-06-16 ENCOUNTER — Other Ambulatory Visit: Payer: Self-pay | Admitting: *Deleted

## 2018-06-24 MED ORDER — AMLODIPINE BESYLATE 10 MG PO TABS: 10.0000 mg | ORAL_TABLET | Freq: Every day | ORAL | 3 refills | Status: DC

## 2018-10-07 ENCOUNTER — Other Ambulatory Visit: Payer: Self-pay

## 2018-10-08 MED ORDER — SIMVASTATIN 80 MG PO TABS: 80.0000 mg | ORAL_TABLET | Freq: Every day | ORAL | 0 refills | Status: DC

## 2018-10-08 MED ORDER — LISINOPRIL-HYDROCHLOROTHIAZIDE 20-25 MG PO TABS: 1.0000 | ORAL_TABLET | Freq: Every day | ORAL | 0 refills | Status: DC

## 2018-10-10 ENCOUNTER — Ambulatory Visit (INDEPENDENT_AMBULATORY_CARE_PROVIDER_SITE_OTHER): Payer: Medicare Other | Admitting: Family Medicine

## 2018-10-10 ENCOUNTER — Encounter: Payer: Self-pay | Admitting: Family Medicine

## 2018-10-10 ENCOUNTER — Other Ambulatory Visit: Payer: Self-pay

## 2018-10-10 VITALS — BP 120/80 | HR 69

## 2018-10-10 DIAGNOSIS — E7800 Pure hypercholesterolemia, unspecified: Secondary | ICD-10-CM

## 2018-10-10 DIAGNOSIS — Z1159 Encounter for screening for other viral diseases: Secondary | ICD-10-CM

## 2018-10-10 DIAGNOSIS — I10 Essential (primary) hypertension: Secondary | ICD-10-CM | POA: Diagnosis not present

## 2018-10-10 DIAGNOSIS — M7989 Other specified soft tissue disorders: Secondary | ICD-10-CM

## 2018-10-10 DIAGNOSIS — R600 Localized edema: Secondary | ICD-10-CM

## 2018-10-10 DIAGNOSIS — E785 Hyperlipidemia, unspecified: Secondary | ICD-10-CM

## 2018-10-10 MED ORDER — METOPROLOL TARTRATE 100 MG PO TABS: 200.0000 mg | ORAL_TABLET | Freq: Two times a day (BID) | ORAL | 3 refills | Status: DC

## 2018-10-10 MED ORDER — LISINOPRIL-HYDROCHLOROTHIAZIDE 20-25 MG PO TABS: 1.0000 | ORAL_TABLET | Freq: Every day | ORAL | 0 refills | Status: DC

## 2018-10-10 MED ORDER — SIMVASTATIN 40 MG PO TABS: 40.0000 mg | ORAL_TABLET | Freq: Every day | ORAL | 3 refills | Status: DC

## 2018-10-10 NOTE — Assessment & Plan Note (Signed)
Leg swelling appears trace bilaterally today in clinic.  Risk factors for DVT include sedentary lifestyle, prostate cancer (now in remission).  Patient was recently taking amlodipine.  Venous insufficiency, lymphedema remain in differential.  For now will DC amlodipine follow-up blood work. -DC amlodipine -Follow-up BMP, CBC -Recommended compression socks

## 2018-10-10 NOTE — Assessment & Plan Note (Signed)
-  Decrease simvastatin to 40 mg daily

## 2018-10-10 NOTE — Patient Instructions (Signed)
It was great to meet you today!  I'm glad that nothing major seems to be bothering you.  We're going to get some labs on you to make sure that your kidney function and electrolytes are in their normal ranges.    Leg swelling - let's hold the amlodipine for now because that can cause leg swelling and your blood pressure is well controlled without that medication.  Simvastatin - we are going to cut your simvastatin in half because we no longer use such a high dose of that medication.  Continue to take it at 40 mg daily.  Blood pressure - from now on.  Take your blood pressure medication at night.

## 2018-10-10 NOTE — Progress Notes (Signed)
    Subjective:  Rick Schwartz is a 78 y.o. male who presents to the [facility] today for routine clinic visit.   HPI: Hypertension He reports that his sister helps him take his blood pressure medication daily.  His lisinopril-hydrochlorothiazide tablet usually taken in the morning and metoprolol titrate 200 mg twice daily.  He recently ran out of amlodipine about 2 weeks ago.  He reports no episodes of dizziness, lightheadedness.  Blood pressure today is well controlled at 120/80.  Hyperlipidemia No history of coronary artery disease.  Currently taking simvastatin 80 mg daily.  No current chest pain, palpitations, shortness of breath.  No history of coronary artery disease.  Lower extremity swelling His sister reports that he occasionally experiences lower extremity swelling.  This typically is more pronounced on the left side compared to the right.  His last episode of lower extremity swelling was about 2 weeks ago.  Not currently experiencing any significant swelling.  He denies shortness of breath, chest pain, palpitations.  He does lead relatively sedentary lifestyle that he is able to ambulate with a cane at home.  He uses a wheelchair here in the clinic.  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  Resting comfortably in his wheelchair. CV: RRR with no murmurs appreciated.  Mildly garbled speech though intelligible.  Able to complete full sentences.  Prefers to let his sister do the talking during our interview. Pulm: NWOB, CTAB with no crackles, wheezes, or rhonchi GI: Normal bowel sounds present. Soft, Nontender, Nondistended. MSK:  Significant right bicep right forearm flexor contractures.  Right arm held over chest with limited mobility/use.  Good  mobility of left arm and left leg. LE: Trace edema noted in lower extremities bilaterally.  Decreased muscle mass on the right leg compared to left.  No tenderness to palpation of right calf/knee. Skin: warm, dry  No results found for this or any previous visit (from the past 72 hour(s)).   Assessment/Plan:  HYPERTENSION, BENIGN SYSTEMIC Blood pressure well controlled today.  No evidence of hypotension.  Will DC amlodipine as blood pressure is well controlled off of amlodipine. -Lisinopril-hydrochlorothiazide 20-25 mg daily -Metoprolol titrate 200 mg twice daily -DC amlodipine -Follow-up BMP  HYPERCHOLESTEROLEMIA -Decrease simvastatin to 40 mg daily  Leg edema Leg swelling appears trace bilaterally today in clinic.  Risk factors for DVT include sedentary lifestyle, prostate cancer (now in remission).  Patient was recently taking amlodipine.  Venous insufficiency, lymphedema remain in differential.  For now will DC amlodipine follow-up blood work. -DC amlodipine -Follow-up BMP, CBC -Recommended compression socks   Health maintenance -Follow-up hepatitis C screening

## 2018-10-10 NOTE — Assessment & Plan Note (Signed)
Blood pressure well controlled today.  No evidence of hypotension.  Will DC amlodipine as blood pressure is well controlled off of amlodipine. -Lisinopril-hydrochlorothiazide 20-25 mg daily -Metoprolol titrate 200 mg twice daily -DC amlodipine -Follow-up BMP

## 2018-10-11 LAB — CBC
Hematocrit: 46.1 % (ref 37.5–51.0)
Hemoglobin: 16 g/dL (ref 13.0–17.7)
MCH: 30.1 pg (ref 26.6–33.0)
MCHC: 34.7 g/dL (ref 31.5–35.7)
MCV: 87 fL (ref 79–97)
Platelets: 258 10*3/uL (ref 150–450)
RBC: 5.31 x10E6/uL (ref 4.14–5.80)
RDW: 13.1 % (ref 11.6–15.4)
WBC: 6.2 10*3/uL (ref 3.4–10.8)

## 2018-10-11 LAB — BASIC METABOLIC PANEL
BUN/Creatinine Ratio: 18 (ref 10–24)
BUN: 13 mg/dL (ref 8–27)
CO2: 23 mmol/L (ref 20–29)
Calcium: 9.7 mg/dL (ref 8.6–10.2)
Chloride: 96 mmol/L (ref 96–106)
Creatinine, Ser: 0.72 mg/dL — ABNORMAL LOW (ref 0.76–1.27)
GFR calc Af Amer: 104 mL/min/{1.73_m2} (ref >59)
GFR calc non Af Amer: 90 mL/min/{1.73_m2} (ref >59)
Glucose: 115 mg/dL — ABNORMAL HIGH (ref 65–99)
Potassium: 3 mmol/L — ABNORMAL LOW (ref 3.5–5.2)
Sodium: 137 mmol/L (ref 134–144)

## 2018-10-11 LAB — HEPATITIS C ANTIBODY (REFLEX): HCV Ab: <0.1 s/co ratio (ref 0.0–0.9)

## 2018-10-11 LAB — HCV COMMENT:

## 2018-10-13 ENCOUNTER — Other Ambulatory Visit: Payer: Self-pay | Admitting: Family Medicine

## 2018-10-13 DIAGNOSIS — E876 Hypokalemia: Secondary | ICD-10-CM

## 2018-10-13 MED ORDER — POTASSIUM CHLORIDE ER 10 MEQ PO TBCR: 10.0000 meq | EXTENDED_RELEASE_TABLET | Freq: Every day | ORAL | 0 refills | Status: DC

## 2018-10-13 NOTE — Progress Notes (Signed)
kdur  

## 2018-10-24 ENCOUNTER — Other Ambulatory Visit: Payer: Self-pay

## 2018-10-24 ENCOUNTER — Other Ambulatory Visit: Payer: Medicare Other

## 2018-10-25 LAB — BASIC METABOLIC PANEL
BUN/Creatinine Ratio: 16 (ref 10–24)
BUN: 14 mg/dL (ref 8–27)
CO2: 23 mmol/L (ref 20–29)
Calcium: 9.1 mg/dL (ref 8.6–10.2)
Chloride: 99 mmol/L (ref 96–106)
Creatinine, Ser: 0.87 mg/dL (ref 0.76–1.27)
GFR calc Af Amer: 96 mL/min/{1.73_m2} (ref >59)
GFR calc non Af Amer: 83 mL/min/{1.73_m2} (ref >59)
Glucose: 114 mg/dL — ABNORMAL HIGH (ref 65–99)
Potassium: 3.2 mmol/L — ABNORMAL LOW (ref 3.5–5.2)
Sodium: 141 mmol/L (ref 134–144)

## 2018-11-11 ENCOUNTER — Other Ambulatory Visit: Payer: Self-pay

## 2018-11-11 DIAGNOSIS — I10 Essential (primary) hypertension: Secondary | ICD-10-CM

## 2018-11-12 ENCOUNTER — Telehealth: Payer: Self-pay | Admitting: Family Medicine

## 2018-11-12 DIAGNOSIS — E876 Hypokalemia: Secondary | ICD-10-CM

## 2018-11-12 MED ORDER — LISINOPRIL-HYDROCHLOROTHIAZIDE 20-25 MG PO TABS: 1.0000 | ORAL_TABLET | Freq: Every day | ORAL | 3 refills | Status: DC

## 2018-11-12 MED ORDER — POTASSIUM CHLORIDE CRYS ER 20 MEQ PO TBCR: 20.0000 meq | EXTENDED_RELEASE_TABLET | Freq: Once | ORAL | 0 refills | Status: DC

## 2018-11-12 MED ORDER — METOPROLOL TARTRATE 100 MG PO TABS: 200.0000 mg | ORAL_TABLET | Freq: Two times a day (BID) | ORAL | 3 refills | Status: DC

## 2018-11-12 NOTE — Telephone Encounter (Signed)
Spoke with sister this afternoon, Mr. Digilio's primary caretaker.  I relayed that this potassium was mildly low similar to the problem that he has had before.  He has previously taken K-Dur 20 mg once daily for hypokalemia. -Restart K-Dur 20 mg daily -Plan for BMP draw in 1 week -Lab appointment scheduled 9/2

## 2018-11-13 ENCOUNTER — Telehealth: Payer: Self-pay

## 2018-11-13 NOTE — Telephone Encounter (Signed)
Pt wife calling to check on Rx for potassium. The pharmacy said the Rx was for only 1 pill. Is this right? Please call [phone]. Ottis Stain, CMA

## 2018-11-13 NOTE — Telephone Encounter (Signed)
Will forward to MD to clarify dosing and quantity.  Jazmin Hartsell,CMA

## 2018-11-14 NOTE — Telephone Encounter (Signed)
Please call and inform sister: Yes, that is the correct dose.  We can adjust as needed according to his upcoming lab drawn.  Thank you, Matilde Haymaker, MD

## 2018-11-14 NOTE — Telephone Encounter (Signed)
Spoke with wife and she is aware that this is correct dose per MD.  Rick Schwartz

## 2018-11-19 ENCOUNTER — Other Ambulatory Visit: Payer: Medicare Other

## 2018-11-19 ENCOUNTER — Other Ambulatory Visit: Payer: Self-pay

## 2018-11-19 DIAGNOSIS — E876 Hypokalemia: Secondary | ICD-10-CM

## 2018-11-20 ENCOUNTER — Telehealth: Payer: Self-pay | Admitting: Family Medicine

## 2018-11-20 DIAGNOSIS — E876 Hypokalemia: Secondary | ICD-10-CM

## 2018-11-20 DIAGNOSIS — E785 Hyperlipidemia, unspecified: Secondary | ICD-10-CM

## 2018-11-20 LAB — BASIC METABOLIC PANEL
BUN/Creatinine Ratio: 16 (ref 10–24)
BUN: 12 mg/dL (ref 8–27)
CO2: 23 mmol/L (ref 20–29)
Calcium: 9.5 mg/dL (ref 8.6–10.2)
Chloride: 105 mmol/L (ref 96–106)
Creatinine, Ser: 0.77 mg/dL (ref 0.76–1.27)
GFR calc Af Amer: 101 mL/min/{1.73_m2} (ref >59)
GFR calc non Af Amer: 88 mL/min/{1.73_m2} (ref >59)
Glucose: 93 mg/dL (ref 65–99)
Potassium: 3 mmol/L — ABNORMAL LOW (ref 3.5–5.2)
Sodium: 143 mmol/L (ref 134–144)

## 2018-11-20 MED ORDER — SIMVASTATIN 40 MG PO TABS: 40.0000 mg | ORAL_TABLET | Freq: Every day | ORAL | 3 refills | Status: DC

## 2018-11-20 MED ORDER — POTASSIUM CHLORIDE CRYS ER 20 MEQ PO TBCR: 20.0000 meq | EXTENDED_RELEASE_TABLET | Freq: Once | ORAL | 2 refills | Status: DC

## 2018-11-20 NOTE — Telephone Encounter (Signed)
Verbal given to Pharmacist, Merrilee Seashore at [facility], to take Potassium daily.

## 2018-11-20 NOTE — Telephone Encounter (Signed)
Rick Schwartz healthcare power of attorney (sister) was contacted with the results of his recent basic metabolic panel.  His potassium remains low at 3.0.  Rick Schwartz sister reports that he only ever took 1 pill (20 mEq) of K-dur.  Only 1 pill was available in the prescription when he was picked up.  This is due to an error in the order.  A new order was put in for potassium 20 mEq daily.  An additional order was put in for a BMP in 1 week.

## 2018-11-28 ENCOUNTER — Other Ambulatory Visit: Payer: Medicare Other

## 2018-11-28 ENCOUNTER — Other Ambulatory Visit: Payer: Self-pay

## 2018-11-28 DIAGNOSIS — E876 Hypokalemia: Secondary | ICD-10-CM

## 2018-11-29 LAB — BASIC METABOLIC PANEL
BUN/Creatinine Ratio: 18 (ref 10–24)
BUN: 12 mg/dL (ref 8–27)
CO2: 21 mmol/L (ref 20–29)
Calcium: 9.7 mg/dL (ref 8.6–10.2)
Chloride: 105 mmol/L (ref 96–106)
Creatinine, Ser: 0.67 mg/dL — ABNORMAL LOW (ref 0.76–1.27)
GFR calc Af Amer: 107 mL/min/{1.73_m2} (ref >59)
GFR calc non Af Amer: 93 mL/min/{1.73_m2} (ref >59)
Glucose: 99 mg/dL (ref 65–99)
Potassium: 3.3 mmol/L — ABNORMAL LOW (ref 3.5–5.2)
Sodium: 142 mmol/L (ref 134–144)

## 2018-12-01 ENCOUNTER — Telehealth: Payer: Self-pay | Admitting: Family Medicine

## 2018-12-01 NOTE — Telephone Encounter (Signed)
Called sister (caretaker) and informed her that his potassium remains only slightly low.  We will keep his potassium supplements at its current dose and check at next visit.  Symptoms of low potassium reviewed.  Matilde Haymaker, MD

## 2019-04-03 ENCOUNTER — Other Ambulatory Visit: Payer: Self-pay | Admitting: *Deleted

## 2019-04-03 DIAGNOSIS — I10 Essential (primary) hypertension: Secondary | ICD-10-CM

## 2019-04-03 MED ORDER — LISINOPRIL-HYDROCHLOROTHIAZIDE 20-25 MG PO TABS: 1.0000 | ORAL_TABLET | Freq: Every day | ORAL | 3 refills | Status: DC

## 2019-04-08 ENCOUNTER — Other Ambulatory Visit: Payer: Self-pay | Admitting: *Deleted

## 2019-04-08 DIAGNOSIS — E876 Hypokalemia: Secondary | ICD-10-CM

## 2019-04-08 MED ORDER — POTASSIUM CHLORIDE CRYS ER 20 MEQ PO TBCR: 20.0000 meq | EXTENDED_RELEASE_TABLET | Freq: Once | ORAL | 2 refills | Status: DC

## 2019-05-18 ENCOUNTER — Ambulatory Visit: Payer: Medicare Other | Attending: Internal Medicine

## 2019-05-18 ENCOUNTER — Other Ambulatory Visit: Payer: Self-pay

## 2019-05-18 DIAGNOSIS — Z23 Encounter for immunization: Secondary | ICD-10-CM | POA: Insufficient documentation

## 2019-05-18 NOTE — Progress Notes (Signed)
   Covid-19 Vaccination Clinic  Name:  Rick Schwartz    MRN: [id number] DOB: 1940/08/06  05/18/2019  [name] was observed post Covid-19 immunization for 15 minutes without incidence. He was provided with Vaccine Information Sheet and instruction to access the V-Safe system.   [name] was instructed to call 911 with any severe reactions post vaccine: [name] Difficulty breathing  . Swelling of your face and throat  . A fast heartbeat  . A bad rash all over your body  . Dizziness and weakness    Immunizations Administered    Name Date Dose VIS Date Route   Pfizer COVID-19 Vaccine 05/18/2019  2:11 PM 0.3 mL 02/27/2019 Intramuscular   Manufacturer: [place]   Lot: [id number]   [place]: [id number]

## 2019-06-10 ENCOUNTER — Ambulatory Visit: Payer: Medicare Other | Attending: Internal Medicine

## 2019-06-10 DIAGNOSIS — Z23 Encounter for immunization: Secondary | ICD-10-CM

## 2019-06-10 NOTE — Progress Notes (Signed)
   Covid-19 Vaccination Clinic  Name:  Rick Schwartz    MRN: [id number] DOB: June 09, 1940  06/10/2019  [name] was observed post Covid-19 immunization for 15 minutes without incident. He was provided with Vaccine Information Sheet and instruction to access the V-Safe system.   [name] was instructed to call 911 with any severe reactions post vaccine: [name] Difficulty breathing  . Swelling of face and throat  . A fast heartbeat  . A bad rash all over body  . Dizziness and weakness   Immunizations Administered    Name Date Dose VIS Date Route   Pfizer COVID-19 Vaccine 06/10/2019 11:07 AM 0.3 mL 02/27/2019 Intramuscular   Manufacturer: [place]   Lot: [id number]   [place]: [id number]

## 2019-06-29 ENCOUNTER — Other Ambulatory Visit: Payer: Self-pay | Admitting: Family Medicine

## 2019-06-29 DIAGNOSIS — I10 Essential (primary) hypertension: Secondary | ICD-10-CM

## 2019-06-29 DIAGNOSIS — E876 Hypokalemia: Secondary | ICD-10-CM

## 2019-07-06 ENCOUNTER — Other Ambulatory Visit: Payer: Self-pay

## 2019-07-06 DIAGNOSIS — I10 Essential (primary) hypertension: Secondary | ICD-10-CM

## 2019-07-06 MED ORDER — METOPROLOL TARTRATE 100 MG PO TABS: 200.0000 mg | ORAL_TABLET | Freq: Two times a day (BID) | ORAL | 3 refills | Status: DC

## 2019-09-27 ENCOUNTER — Other Ambulatory Visit: Payer: Self-pay | Admitting: Family Medicine

## 2019-09-27 DIAGNOSIS — E876 Hypokalemia: Secondary | ICD-10-CM

## 2019-09-28 ENCOUNTER — Encounter: Payer: Self-pay | Admitting: Family Medicine

## 2019-09-28 ENCOUNTER — Other Ambulatory Visit: Payer: Self-pay

## 2019-09-28 ENCOUNTER — Ambulatory Visit (INDEPENDENT_AMBULATORY_CARE_PROVIDER_SITE_OTHER): Payer: Medicare Other | Admitting: Family Medicine

## 2019-09-28 VITALS — BP 122/82 | HR 59 | Ht 66.0 in | Wt 121.8 lb

## 2019-09-28 DIAGNOSIS — G809 Cerebral palsy, unspecified: Secondary | ICD-10-CM

## 2019-09-28 DIAGNOSIS — E7800 Pure hypercholesterolemia, unspecified: Secondary | ICD-10-CM

## 2019-09-28 DIAGNOSIS — R600 Localized edema: Secondary | ICD-10-CM | POA: Diagnosis not present

## 2019-09-28 DIAGNOSIS — R7303 Prediabetes: Secondary | ICD-10-CM | POA: Diagnosis not present

## 2019-09-28 DIAGNOSIS — E739 Lactose intolerance, unspecified: Secondary | ICD-10-CM

## 2019-09-28 DIAGNOSIS — I10 Essential (primary) hypertension: Secondary | ICD-10-CM | POA: Diagnosis not present

## 2019-09-28 DIAGNOSIS — C61 Malignant neoplasm of prostate: Secondary | ICD-10-CM

## 2019-09-28 NOTE — Assessment & Plan Note (Signed)
Stable -Continue lisinopril-hydrochlorothiazide 20-25 daily -Continue metoprolol 200 mg twice daily -Follow-up BMP

## 2019-09-28 NOTE — Assessment & Plan Note (Signed)
-  Follow-up BMP 

## 2019-09-28 NOTE — Assessment & Plan Note (Signed)
Follow-up CBC ?

## 2019-09-28 NOTE — Assessment & Plan Note (Signed)
-  Follow-up A1c

## 2019-09-28 NOTE — Assessment & Plan Note (Signed)
Per chart review, it is unclear when he last followed up with urology.  Based on our conversation, seems to have been at least several years ago.  They seem to be waiting for a call from the urologist until following up.  We will order PSA for monitoring purposes and consider referral to urology if elevated.  Of note, the wait for this office visit appears to be often will be remeasured on 7/13.  If the recorded weight is in fact true it is reasonable to begin work-up with a prostate cancer investigation.

## 2019-09-28 NOTE — Patient Instructions (Signed)
It was great to see you today Rick Schwartz!  Thank you for being so patient!  Please come back in the morning for your lab work.  Have a great day.  I will refill your medications.

## 2019-09-28 NOTE — Progress Notes (Signed)
    SUBJECTIVE:   CHIEF COMPLAINT / HPI:   Hypertension He currently takes his combination lisinopril-HCTZ in addition to metoprolol.  He denies any lightheadedness or dizziness with exertion.  History of prostate cancer He has previously followed with urology.  He was told at his last follow-up that he did not need to be seen for 5 years.  He is planning to follow-up once he receives a call from urology to follow-up again.  Cerebral palsy Per his report and the report of his sister, there does not seem to have been any recent changes in his contractures or their interference in his life.  He continues to have significant upper extremity contractures of the right arm and hand and maintains good function with the left arm and hand.  Lower extremity swelling No significant changes.  No pain in his lower extremities.  Not painful.  Not bothersome.   PERTINENT  PMH / PSH: Cerebral palsy with upper extremity contractures, wheelchair-bound, history of prostate cancer, hypertension  OBJECTIVE:   BP 122/82   Pulse (!) 59   Ht 5\' 6"  (1.676 m)   Wt 121 lb 12.8 oz (55.2 kg)   SpO2 99%   BMI 19.66 kg/m    General: Seated comfortably in his wheelchair in no acute distress.  Right upper extremity flexed at the elbow and wrist.  Good function with his left hand. Cardio: Normal S1 and S2, no S3 or S4. Rhythm is regular. Pulm: Clear to auscultation bilaterally, no crackles, wheezing, or diminished breath sounds. Normal respiratory effort Abdomen: Bowel sounds normal. Abdomen soft and non-tender.  Extremities: No peripheral edema. Warm/ well perfused.  Strong radial pulse. Neuro: Cranial nerves grossly intact   ASSESSMENT/PLAN:   HYPERTENSION, BENIGN SYSTEMIC Stable -Continue lisinopril-hydrochlorothiazide 20-25 daily -Continue metoprolol 200 mg twice daily -Follow-up BMP  GLUCOSE INTOLERANCE -Follow-up A1c  Infantile cerebral palsy (HCC) Stable.  No significant changes in his  contractures.  No interested in further medication or therapy at this time.  We will continue to monitor.  PROSTATE CANCER Per chart review, it is unclear when he last followed up with urology.  Based on our conversation, seems to have been at least several years ago.  They seem to be waiting for a call from the urologist until following up.  We will order PSA for monitoring purposes and consider referral to urology if elevated.  Of note, the wait for this office visit appears to be often will be remeasured on 7/13.  If the recorded weight is in fact true it is reasonable to begin work-up with a prostate cancer investigation.  HYPERCHOLESTEROLEMIA -Follow-up BMP  Leg edema -Follow-up CBC     Matilde Haymaker, MD [place]

## 2019-09-28 NOTE — Assessment & Plan Note (Signed)
Stable.  No significant changes in his contractures.  No interested in further medication or therapy at this time.  We will continue to monitor.

## 2019-09-29 ENCOUNTER — Other Ambulatory Visit: Payer: Self-pay

## 2019-09-29 ENCOUNTER — Other Ambulatory Visit: Payer: Medicare Other

## 2019-09-29 DIAGNOSIS — I10 Essential (primary) hypertension: Secondary | ICD-10-CM

## 2019-09-29 DIAGNOSIS — R600 Localized edema: Secondary | ICD-10-CM

## 2019-09-29 DIAGNOSIS — E7800 Pure hypercholesterolemia, unspecified: Secondary | ICD-10-CM

## 2019-09-29 DIAGNOSIS — R7303 Prediabetes: Secondary | ICD-10-CM

## 2019-09-29 DIAGNOSIS — C61 Malignant neoplasm of prostate: Secondary | ICD-10-CM

## 2019-09-30 ENCOUNTER — Encounter: Payer: Self-pay | Admitting: Family Medicine

## 2019-09-30 LAB — CBC
Hematocrit: 50.5 % (ref 37.5–51.0)
Hemoglobin: 17.2 g/dL (ref 13.0–17.7)
MCH: 31.2 pg (ref 26.6–33.0)
MCHC: 34.1 g/dL (ref 31.5–35.7)
MCV: 92 fL (ref 79–97)
Platelets: 225 10*3/uL (ref 150–450)
RBC: 5.51 x10E6/uL (ref 4.14–5.80)
RDW: 12.2 % (ref 11.6–15.4)
WBC: 3.5 10*3/uL (ref 3.4–10.8)

## 2019-09-30 LAB — BASIC METABOLIC PANEL
BUN/Creatinine Ratio: 12 (ref 10–24)
BUN: 10 mg/dL (ref 8–27)
CO2: 23 mmol/L (ref 20–29)
Calcium: 9.6 mg/dL (ref 8.6–10.2)
Chloride: 104 mmol/L (ref 96–106)
Creatinine, Ser: 0.85 mg/dL (ref 0.76–1.27)
GFR calc Af Amer: 96 mL/min/{1.73_m2} (ref >59)
GFR calc non Af Amer: 83 mL/min/{1.73_m2} (ref >59)
Glucose: 93 mg/dL (ref 65–99)
Potassium: 3.7 mmol/L (ref 3.5–5.2)
Sodium: 141 mmol/L (ref 134–144)

## 2019-09-30 LAB — HEMOGLOBIN A1C
Est. average glucose Bld gHb Est-mCnc: 114 mg/dL
Hgb A1c MFr Bld: 5.6 % (ref 4.8–5.6)

## 2019-09-30 LAB — PSA: Prostate Specific Ag, Serum: <0.1 ng/mL (ref 0.0–4.0)

## 2019-09-30 LAB — LIPID PANEL
Chol/HDL Ratio: 3.3 ratio (ref 0.0–5.0)
Cholesterol, Total: 164 mg/dL (ref 100–199)
HDL: 49 mg/dL (ref >39)
LDL Chol Calc (NIH): 97 mg/dL (ref 0–99)
Triglycerides: 97 mg/dL (ref 0–149)
VLDL Cholesterol Cal: 18 mg/dL (ref 5–40)

## 2019-10-26 ENCOUNTER — Other Ambulatory Visit: Payer: Self-pay | Admitting: Family Medicine

## 2019-10-26 DIAGNOSIS — I10 Essential (primary) hypertension: Secondary | ICD-10-CM

## 2019-10-27 ENCOUNTER — Other Ambulatory Visit: Payer: Self-pay | Admitting: Family Medicine

## 2019-10-27 DIAGNOSIS — I10 Essential (primary) hypertension: Secondary | ICD-10-CM

## 2019-12-26 ENCOUNTER — Other Ambulatory Visit: Payer: Self-pay | Admitting: Family Medicine

## 2019-12-26 DIAGNOSIS — E876 Hypokalemia: Secondary | ICD-10-CM

## 2020-02-13 ENCOUNTER — Other Ambulatory Visit: Payer: Self-pay | Admitting: Family Medicine

## 2020-02-13 DIAGNOSIS — I10 Essential (primary) hypertension: Secondary | ICD-10-CM

## 2020-02-24 ENCOUNTER — Other Ambulatory Visit: Payer: Self-pay | Admitting: Family Medicine

## 2020-02-24 DIAGNOSIS — I10 Essential (primary) hypertension: Secondary | ICD-10-CM

## 2020-03-25 ENCOUNTER — Other Ambulatory Visit: Payer: Self-pay | Admitting: Family Medicine

## 2020-03-25 DIAGNOSIS — E876 Hypokalemia: Secondary | ICD-10-CM

## 2020-05-05 NOTE — Progress Notes (Signed)
    SUBJECTIVE:   CHIEF COMPLAINT / HPI:   Presents with sister Newell Coral  Deconditioning and Health decline: patient presents to clinic today with his Sister Ms. Newell Coral.  She expresses her concern that the patient has lost some of his strength and range of motion to the point where he is now wheelchair-bound and cannot ambulate well.  Mr. Dunn is taking care of by his sisters and they are having difficulty caring for him due to the decline in his condition.  The patient has a history of cerebral palsy.  The patient is otherwise well-appearing and at his baseline, is very pleasant, expresses no concern or distress, but does feel weaker and that he needs to be in his wheelchair.   PERTINENT  PMH / PSH:  Infantile CP, Hx Prostate cancer, Prediabetes, HTN  OBJECTIVE:   BP 120/60   Pulse (!) 57   SpO2 99%    Physical exam: General: Well-appearing patient, no apparent distress Respiratory: CTA bilaterally, comfortable work of breathing Cardio: Regular rhythm, rate ~60, no murmurs MSK: Patient with contractures of his hands, decreased range of motion appreciated to his bilateral elbows, knees.  Assessment is limited secondary to patient being wheelchair-bound. Neuro: Cranial nerves II-XII grossly intact, no focal neurological deficit appreciated, 5/5 strength appreciated in bilateral upper and lower extremities; assessment limited secondary to patient being wheelchair-bound   ASSESSMENT/PLAN:   Physical deconditioning Patient with decreased range of motion to bilateral upper and lower extremities (very apparent to patient's caregiver/sister).  No focal neurological deficits appreciated, no concerning for stroke or other such neurological adverse event.  Patient is otherwise quite well-appearing. -Order placed for home health physical therapy to help improve patient's range of motion and strength  -Strict return precautions provided regarding weakness on one side of the  patient's body versus the other, new/unusual facial droop, lethargy, etc.     Daisy Floro, [place]

## 2020-05-06 ENCOUNTER — Ambulatory Visit (INDEPENDENT_AMBULATORY_CARE_PROVIDER_SITE_OTHER): Payer: Medicare Other | Admitting: Family Medicine

## 2020-05-06 ENCOUNTER — Other Ambulatory Visit: Payer: Self-pay

## 2020-05-06 VITALS — BP 120/60 | HR 57

## 2020-05-06 DIAGNOSIS — R5381 Other malaise: Secondary | ICD-10-CM | POA: Diagnosis not present

## 2020-05-06 DIAGNOSIS — G809 Cerebral palsy, unspecified: Secondary | ICD-10-CM

## 2020-05-06 NOTE — Patient Instructions (Addendum)
I have made a referral to [facility] for Physical Therapy - strengthening and range of motion exercises. Please look out for calls from a number you may not recognize.    Milus Banister, [place], PGY-3 05/06/2020 10:34 AM

## 2020-05-09 NOTE — Assessment & Plan Note (Signed)
Patient with decreased range of motion to bilateral upper and lower extremities (very apparent to patient's caregiver/sister).  No focal neurological deficits appreciated, no concerning for stroke or other such neurological adverse event.  Patient is otherwise quite well-appearing. -Order placed for home health physical therapy to help improve patient's range of motion and strength  -Strict return precautions provided regarding weakness on one side of the patient's body versus the other, new/unusual facial droop, lethargy, etc.

## 2020-05-12 ENCOUNTER — Telehealth: Payer: Self-pay

## 2020-05-12 NOTE — Telephone Encounter (Signed)
[facility] PT calls nurse line requesting VO for [facility] PT as follows.   1x a week for 1 week  2x a week for 4 weeks  1x a week for 2 weeks OT evaluation  Verbal orders given to [facility] via VM per [facility] protocol.

## 2020-05-16 ENCOUNTER — Telehealth: Payer: Self-pay

## 2020-05-16 NOTE — Telephone Encounter (Signed)
Will Schella from [facility] calling for OT verbal orders as follows:  2 time(s) weekly for 4 week(s), then 1 time(s) weekly for 1 week(s)  Verbal orders given per [facility] protocol  Talbot Grumbling, RN

## 2020-06-23 ENCOUNTER — Other Ambulatory Visit: Payer: Self-pay | Admitting: Family Medicine

## 2020-06-23 DIAGNOSIS — I10 Essential (primary) hypertension: Secondary | ICD-10-CM

## 2020-06-23 DIAGNOSIS — E876 Hypokalemia: Secondary | ICD-10-CM

## 2020-06-30 ENCOUNTER — Telehealth: Payer: Self-pay

## 2020-06-30 NOTE — Telephone Encounter (Signed)
Rick Schwartz PT calls nurse line stating the patient is having a hard time baring weight on his left foot. Rick Schwartz feels Rick Schwartz needs to be assessed by a provider. Rick Schwartz feels Rick Schwartz is regressing. Patient scheduled with PCP for next week.

## 2020-07-07 ENCOUNTER — Ambulatory Visit (INDEPENDENT_AMBULATORY_CARE_PROVIDER_SITE_OTHER): Payer: Medicare Other | Admitting: Family Medicine

## 2020-07-07 ENCOUNTER — Telehealth: Payer: Self-pay

## 2020-07-07 ENCOUNTER — Other Ambulatory Visit: Payer: Self-pay

## 2020-07-07 ENCOUNTER — Other Ambulatory Visit: Payer: Self-pay | Admitting: Family Medicine

## 2020-07-07 ENCOUNTER — Encounter: Payer: Self-pay | Admitting: Family Medicine

## 2020-07-07 VITALS — BP 83/58 | HR 90 | Ht 66.0 in

## 2020-07-07 DIAGNOSIS — R29898 Other symptoms and signs involving the musculoskeletal system: Secondary | ICD-10-CM | POA: Insufficient documentation

## 2020-07-07 DIAGNOSIS — R29818 Other symptoms and signs involving the nervous system: Secondary | ICD-10-CM | POA: Diagnosis not present

## 2020-07-07 DIAGNOSIS — G809 Cerebral palsy, unspecified: Secondary | ICD-10-CM | POA: Diagnosis not present

## 2020-07-07 NOTE — Progress Notes (Signed)
SUBJECTIVE:   CHIEF COMPLAINT / HPI:   Concern for CVA Rick Schwartz was brought into clinic today by his sister, Ghassan Coggeshall, and his health aide.  They had several concerns they are hoping to address today.  It sounds like, they noticed that he has been having several new difficulties for the past 3 weeks.  Specifically, he is no longer able to functionally use his left hand to eat, he is not able to sit up straight in a chair and his speech is now more difficult to understand compared to 3 weeks ago.  They are not positive when this started, does not seem to pinpoint to his certain day.  He is seen by physical therapy regularly who noted that they were concerned he may have had a stroke.  The family would like to know if any additional work-up is needed.  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  He now is forced to hold it in an elevated position and cannot comfortably rest his foot on the floor.  He does not currently take any muscle relaxers or medication for contractures.  Hypertension His current medication includes: -Lisinopril-HCTZ 20-25 mg -Metoprolol titrate 200 mg twice daily  PERTINENT  PMH / PSH: Cerebral palsy, wheelchair-bound  OBJECTIVE:   BP (!) 83/58   Pulse 90   Ht 5\' 6"  (1.676 m)   SpO2 98%   BMI 19.66 kg/m  Note General: Mr. Parisi is seated comfortably in his wheelchair today in no acute distress.  He responds primarily in yes or no responses to questions.  He is very difficult to understand any further articulation.  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  He does not have full use of his left arm or leg although his debility on his left side is less severe. Neuro: Cranial nerves II through X intact.  Sensation to light touch intact over face, upper extremities and lower extremities. Cardio: Normal S1 and S2, no S3 or S4. Rhythm is regular. No murmurs or rubs.    Pulm: Clear to auscultation bilaterally, no crackles, wheezing, or diminished breath sounds. Normal respiratory effort Abdomen: Bowel sounds normal. Abdomen soft and non-tender.  Extremities: No peripheral edema. Warm/ well perfused.  Strong radial pulses.   ASSESSMENT/PLAN:   Focal motor deficit Based on the history provided of decreased use of his left hand, decreased trunk strength and dysarthria, I am concerned he may have had a CVA 3 weeks ago.  With the patient, his sister and his caregiver, I discussed the options of: Going to the ED for immediate care and a full work-up of a stroke, direct admission for further work-up of a stroke and, finally, slow or outpatient work-up of a stroke.  The patient and his sister were in favor of avoiding hospitalization and working this up in the outpatient setting, recognizing that the work-up would take much longer and be more difficult to coordinate.  Today, we will start with some blood work, CT head and echocardiogram.  We will also send out referrals to physical therapy, Occupational Therapy, speech therapy and neurology. -Follow-up CT head -Follow-up echo -Placed referral to PT/OT/SLP -Placed referral to neuro -Return to clinic in 1 week  His blood pressure is unnecessarily low today at 83/58.  He was advised to discontinue his lisinopril-HCTZ for now.  We will continue to monitor his blood pressure and follow-up in 1 week.  His  sister, Jayan Raymundo, would like to be the point person for coordinating the above-noted care.  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  It seems like this is going on for the better part of the year.  He is interested in medication for this problem at the moment.  We can try low-dose baclofen to see if that is helpful for this issue but I do not want to start him on baclofen in the middle of our stroke work-up.  We will  plan to start low-dose baclofen in the next month or 2 once we have more information related to this possible stroke work-up.     Matilde Haymaker, MD [place]

## 2020-07-07 NOTE — Telephone Encounter (Signed)
Myra from [facility] calling for PT verbal orders as follows:  2 time(s) weekly for 2 week(s), then 1 time(s) weekly for 2 week(s)  Verbal orders given per [facility] protocol  PT is also asking if provider has any recommendations or requests on exercises patient should be performing.   Please advise.   Talbot Grumbling, RN

## 2020-07-07 NOTE — Assessment & Plan Note (Signed)
He seems to be experiencing a worsening contracture of his right leg.  It seems like this is going on for the better part of the year.  He is interested in medication for this problem at the moment.  We can try low-dose baclofen to see if that is helpful for this issue but I do not want to start him on baclofen in the middle of our stroke work-up.  We will plan to start low-dose baclofen in the next month or 2 once we have more information related to this possible stroke work-up.

## 2020-07-07 NOTE — Assessment & Plan Note (Signed)
Based on the history provided of decreased use of his left hand, decreased trunk strength and dysarthria, I am concerned he may have had a CVA 3 weeks ago.  With the patient, his sister and his caregiver, I discussed the options of: Going to the ED for immediate care and a full work-up of a stroke, direct admission for further work-up of a stroke and, finally, slow or outpatient work-up of a stroke.  The patient and his sister were in favor of avoiding hospitalization and working this up in the outpatient setting, recognizing that the work-up would take much longer and be more difficult to coordinate.  Today, we will start with some blood work, CT head and echocardiogram.  We will also send out referrals to physical therapy, Occupational Therapy, speech therapy and neurology. -Follow-up CT head -Follow-up echo -Placed referral to PT/OT/SLP -Placed referral to neuro -Return to clinic in 1 week  His blood pressure is unnecessarily low today at 83/58.  He was advised to discontinue his lisinopril-HCTZ for now.  We will continue to monitor his blood pressure and follow-up in 1 week.  His sister, Argus Caraher, would like to be the point person for coordinating the above-noted care.  Her phone number is [phone]

## 2020-07-07 NOTE — Patient Instructions (Signed)
I am sorry that Rick Schwartz has been doing a bit worse for the past several weeks.  I think it is very reasonable to move forward with an outpatient stroke work-up.  Please realize, that everything will happen more slowly outside of the hospital but we can still get the same testing done.  I have placed referrals for physical therapy, Occupational Therapy, speech therapy and neurology.  You should get calls about scheduling appointments with these providers in the next 1-2 weeks.  We will also get you scheduled with an appointment for a CT of Rick Schwartz's head in addition to an echocardiogram of his heart.  Please schedule a follow-up appointment with me at the end of next week to make sure we are following up on everything appropriately.  I recommend that he stop taking his blood pressure medication until he sees me again next week.  I do not want to give him any muscle relaxer until we move forward with a stroke work-up.

## 2020-07-08 ENCOUNTER — Other Ambulatory Visit: Payer: Self-pay | Admitting: Family Medicine

## 2020-07-08 ENCOUNTER — Ambulatory Visit
Admission: RE | Admit: 2020-07-08 | Discharge: 2020-07-08 | Disposition: A | Discharge status: Home / Self Care | Payer: Medicare Other | Source: Ambulatory Visit | Attending: Family Medicine | Admitting: Family Medicine

## 2020-07-08 DIAGNOSIS — R29898 Other symptoms and signs involving the musculoskeletal system: Secondary | ICD-10-CM

## 2020-07-08 DIAGNOSIS — R29818 Other symptoms and signs involving the nervous system: Secondary | ICD-10-CM

## 2020-07-08 LAB — COMPREHENSIVE METABOLIC PANEL
ALT: 60 IU/L — ABNORMAL HIGH (ref 0–44)
AST: 56 IU/L — ABNORMAL HIGH (ref 0–40)
Albumin/Globulin Ratio: 0.8 — ABNORMAL LOW (ref 1.2–2.2)
Albumin: 2.8 g/dL — ABNORMAL LOW (ref 3.7–4.7)
Alkaline Phosphatase: 95 IU/L (ref 44–121)
BUN/Creatinine Ratio: 56 — ABNORMAL HIGH (ref 10–24)
BUN: 80 mg/dL (ref 8–27)
Bilirubin Total: 0.6 mg/dL (ref 0.0–1.2)
CO2: 17 mmol/L — ABNORMAL LOW (ref 20–29)
Calcium: 10.5 mg/dL — ABNORMAL HIGH (ref 8.6–10.2)
Chloride: 105 mmol/L (ref 96–106)
Creatinine, Ser: 1.43 mg/dL — ABNORMAL HIGH (ref 0.76–1.27)
Globulin, Total: 3.6 g/dL (ref 1.5–4.5)
Glucose: 122 mg/dL — ABNORMAL HIGH (ref 65–99)
Potassium: 4.7 mmol/L (ref 3.5–5.2)
Sodium: 144 mmol/L (ref 134–144)
Total Protein: 6.4 g/dL (ref 6.0–8.5)
eGFR: 50 mL/min/{1.73_m2} — ABNORMAL LOW (ref >59)

## 2020-07-08 LAB — CBC
Hematocrit: 51.4 % — ABNORMAL HIGH (ref 37.5–51.0)
Hemoglobin: 16.4 g/dL (ref 13.0–17.7)
MCH: 29.9 pg (ref 26.6–33.0)
MCHC: 31.9 g/dL (ref 31.5–35.7)
MCV: 94 fL (ref 79–97)
Platelets: 291 10*3/uL (ref 150–450)
RBC: 5.49 x10E6/uL (ref 4.14–5.80)
RDW: 12.7 % (ref 11.6–15.4)
WBC: 14.1 10*3/uL — ABNORMAL HIGH (ref 3.4–10.8)

## 2020-07-08 NOTE — Progress Notes (Signed)
Spoke with sister, Rick Schwartz, and informed her that CT showed no evidence of hemorrhagic infarct.  We will move forward with an MRI of the brain.  She acknowledged understanding.  Rick Schwartz has a follow-up appointment next week.

## 2020-07-14 ENCOUNTER — Ambulatory Visit (INDEPENDENT_AMBULATORY_CARE_PROVIDER_SITE_OTHER): Payer: Medicare Other | Admitting: Family Medicine

## 2020-07-14 ENCOUNTER — Other Ambulatory Visit: Payer: Self-pay

## 2020-07-14 ENCOUNTER — Encounter: Payer: Self-pay | Admitting: Family Medicine

## 2020-07-14 VITALS — BP 101/43 | HR 67 | Ht 66.0 in

## 2020-07-14 DIAGNOSIS — N179 Acute kidney failure, unspecified: Secondary | ICD-10-CM

## 2020-07-14 DIAGNOSIS — R29898 Other symptoms and signs involving the musculoskeletal system: Secondary | ICD-10-CM | POA: Diagnosis not present

## 2020-07-14 DIAGNOSIS — D72829 Elevated white blood cell count, unspecified: Secondary | ICD-10-CM | POA: Diagnosis not present

## 2020-07-14 NOTE — Progress Notes (Signed)
    SUBJECTIVE:   CHIEF COMPLAINT / HPI:   Likely stroke Rick Schwartz is following up in clinic today for an outpatient stroke work-up.  Since our last visit roughly 1 week ago, he had a CT head which showed no evidence of hemorrhagic stroke.  He is brought into clinic today with his sister and home health aide.  There are no new concerns since her last visit.  They report that he seems to be eating and drinking well.  They have not yet been called regarding an appointment with neurology.  PERTINENT  PMH / PSH: Infantile cerebral palsy, hypertension,   OBJECTIVE:   BP (!) 101/43   Pulse 67   Ht 5\' 6"  (1.676 m)   SpO2 (!) 68%   BMI 19.66 kg/m    General: Seated comfortably in his wheelchair.  Significant right arm and right leg contracture.  He responds primarily in brief, several word sentences and his sister and health aide provide the majority of the HPI. Cardio: Normal S1 and S2, no S3 or S4. Rhythm is regular. No murmurs or rubs.   Pulm: Clear to auscultation bilaterally, no crackles, wheezing, or diminished breath sounds. Normal respiratory effort Abdomen: Bowel sounds normal. Abdomen soft and non-tender.  Neuro: Cranial nerves grossly intact  ASSESSMENT/PLAN:   Focal motor deficit Likely CVA.  And MRI brain has been ordered but it sounds like the family has not yet been contacted about scheduling this appointment.  They do have appropriate follow-up with PT, OT and SLP and neurology.  Today, we discussed his lab results and that they were notable for significant dehydration.  His sister and caretaker were encouraged to make sure that he is eating regularly and taking plenty of fluids.  We will recheck his labs today.  If he continues to show evidence of minor liver injury, we will move forward with a hepatitis panel.  Regarding his new focal deficits, I am concerned that he probably does have some oropharyngeal symptoms and swallowing issues.  I did introduce the topic of  palliative care today and mention that we will likely not be able to reverse many of his symptoms and it may be helpful to consider speaking with palliative care in the future. -Follow-up CBC, CMP -Follow-up in 2 weeks      Rick Haymaker, MD [place]

## 2020-07-14 NOTE — Assessment & Plan Note (Addendum)
Likely CVA.  And MRI brain has been ordered but it sounds like the family has not yet been contacted about scheduling this appointment.  They do have appropriate follow-up with PT, OT and SLP and neurology.  Today, we discussed his lab results and that they were notable for significant dehydration.  His sister and caretaker were encouraged to make sure that he is eating regularly and taking plenty of fluids.  We will recheck his labs today.  If he continues to show evidence of minor liver injury, we will move forward with a hepatitis panel.  Regarding his new focal deficits, I am concerned that he probably does have some oropharyngeal symptoms and swallowing issues.  I did introduce the topic of palliative care today and mention that we will likely not be able to reverse many of his symptoms and it may be helpful to consider speaking with palliative care in the future. -Follow-up CBC, CMP -Follow-up in 2 weeks

## 2020-07-14 NOTE — Patient Instructions (Signed)
It was great to see you today.  Here is a quick review of the things we talked about:  Possible stroke: We have not yet verified that he has had a stroke but I think that is the most likely cause of his new weaknesses and debility.  I am going to make sure that you get a call about scheduling the MRI of his head.  Please make sure to follow-up with physical therapy, Occupational Therapy and speech therapy.  I will also make sure that you get a call about neurology.  I think it is important to realize that we may not be able to reverse his symptoms and bring him back to his baseline.  Please come back to clinic in about 2 weeks to make sure that this work-up is moving along appropriately.  In the meantime, please make sure that Rick Schwartz is eating and drinking well.   If all of your labs are normal, I will send you a message over my chart or send you a letter.  If there is anything to discuss, I will give you a phone call.

## 2020-07-15 ENCOUNTER — Other Ambulatory Visit: Payer: Self-pay | Admitting: Family Medicine

## 2020-07-15 DIAGNOSIS — R748 Abnormal levels of other serum enzymes: Secondary | ICD-10-CM

## 2020-07-15 LAB — CBC
Hematocrit: 47.2 % (ref 37.5–51.0)
Hemoglobin: 16 g/dL (ref 13.0–17.7)
MCH: 30.2 pg (ref 26.6–33.0)
MCHC: 33.9 g/dL (ref 31.5–35.7)
MCV: 89 fL (ref 79–97)
Platelets: 284 10*3/uL (ref 150–450)
RBC: 5.3 x10E6/uL (ref 4.14–5.80)
RDW: 11.8 % (ref 11.6–15.4)
WBC: 22.8 10*3/uL (ref 3.4–10.8)

## 2020-07-15 LAB — COMPREHENSIVE METABOLIC PANEL
ALT: 62 IU/L — ABNORMAL HIGH (ref 0–44)
AST: 68 IU/L — ABNORMAL HIGH (ref 0–40)
Albumin/Globulin Ratio: 0.7 — ABNORMAL LOW (ref 1.2–2.2)
Albumin: 2.6 g/dL — ABNORMAL LOW (ref 3.7–4.7)
Alkaline Phosphatase: 127 IU/L — ABNORMAL HIGH (ref 44–121)
BUN/Creatinine Ratio: 28 — ABNORMAL HIGH (ref 10–24)
BUN: 23 mg/dL (ref 8–27)
Bilirubin Total: 0.8 mg/dL (ref 0.0–1.2)
CO2: 19 mmol/L — ABNORMAL LOW (ref 20–29)
Calcium: 10 mg/dL (ref 8.6–10.2)
Chloride: 106 mmol/L (ref 96–106)
Creatinine, Ser: 0.83 mg/dL (ref 0.76–1.27)
Globulin, Total: 3.6 g/dL (ref 1.5–4.5)
Glucose: 133 mg/dL — ABNORMAL HIGH (ref 65–99)
Potassium: 4.4 mmol/L (ref 3.5–5.2)
Sodium: 145 mmol/L — ABNORMAL HIGH (ref 134–144)
Total Protein: 6.2 g/dL (ref 6.0–8.5)
eGFR: 89 mL/min/{1.73_m2} (ref >59)

## 2020-07-15 NOTE — Progress Notes (Signed)
Spoke with patients sister Hiren Peplinski. Informed her of patients appt with [facility] imaging on May 20th at 8:15am. Patient is not to have any food or drink after midnight the night before appt.. I let her know that is will be at the [place] location. .Patients sister understood. Salvatore Marvel, CMA

## 2020-07-17 ENCOUNTER — Emergency Department (HOSPITAL_COMMUNITY): Payer: Medicare Other

## 2020-07-17 ENCOUNTER — Inpatient Hospital Stay (HOSPITAL_COMMUNITY)
Admission: EM | Admit: 2020-07-17 | Discharge: 2020-07-27 | DRG: 853 | Disposition: A | Discharge status: Skilled Nursing Facility | Payer: Medicare Other | Attending: Family Medicine | Admitting: Family Medicine

## 2020-07-17 ENCOUNTER — Other Ambulatory Visit: Payer: Self-pay

## 2020-07-17 DIAGNOSIS — Z833 Family history of diabetes mellitus: Secondary | ICD-10-CM

## 2020-07-17 DIAGNOSIS — E876 Hypokalemia: Secondary | ICD-10-CM

## 2020-07-17 DIAGNOSIS — R6520 Severe sepsis without septic shock: Secondary | ICD-10-CM | POA: Diagnosis present

## 2020-07-17 DIAGNOSIS — I96 Gangrene, not elsewhere classified: Secondary | ICD-10-CM | POA: Diagnosis present

## 2020-07-17 DIAGNOSIS — J690 Pneumonitis due to inhalation of food and vomit: Secondary | ICD-10-CM | POA: Diagnosis present

## 2020-07-17 DIAGNOSIS — I69365 Other paralytic syndrome following cerebral infarction, bilateral: Secondary | ICD-10-CM

## 2020-07-17 DIAGNOSIS — E872 Acidosis, unspecified: Secondary | ICD-10-CM | POA: Diagnosis present

## 2020-07-17 DIAGNOSIS — D696 Thrombocytopenia, unspecified: Secondary | ICD-10-CM | POA: Diagnosis present

## 2020-07-17 DIAGNOSIS — I70221 Atherosclerosis of native arteries of extremities with rest pain, right leg: Secondary | ICD-10-CM | POA: Diagnosis present

## 2020-07-17 DIAGNOSIS — R140 Abdominal distension (gaseous): Secondary | ICD-10-CM

## 2020-07-17 DIAGNOSIS — K56609 Unspecified intestinal obstruction, unspecified as to partial versus complete obstruction: Secondary | ICD-10-CM

## 2020-07-17 DIAGNOSIS — E43 Unspecified severe protein-calorie malnutrition: Secondary | ICD-10-CM | POA: Diagnosis present

## 2020-07-17 DIAGNOSIS — G709 Myoneural disorder, unspecified: Secondary | ICD-10-CM | POA: Diagnosis present

## 2020-07-17 DIAGNOSIS — Z8349 Family history of other endocrine, nutritional and metabolic diseases: Secondary | ICD-10-CM

## 2020-07-17 DIAGNOSIS — A419 Sepsis, unspecified organism: Secondary | ICD-10-CM | POA: Diagnosis present

## 2020-07-17 DIAGNOSIS — M7989 Other specified soft tissue disorders: Secondary | ICD-10-CM | POA: Diagnosis not present

## 2020-07-17 DIAGNOSIS — I70261 Atherosclerosis of native arteries of extremities with gangrene, right leg: Secondary | ICD-10-CM | POA: Diagnosis present

## 2020-07-17 DIAGNOSIS — R1110 Vomiting, unspecified: Secondary | ICD-10-CM

## 2020-07-17 DIAGNOSIS — L97513 Non-pressure chronic ulcer of other part of right foot with necrosis of muscle: Secondary | ICD-10-CM | POA: Diagnosis present

## 2020-07-17 DIAGNOSIS — R54 Age-related physical debility: Secondary | ICD-10-CM | POA: Diagnosis present

## 2020-07-17 DIAGNOSIS — Z79899 Other long term (current) drug therapy: Secondary | ICD-10-CM

## 2020-07-17 DIAGNOSIS — R339 Retention of urine, unspecified: Secondary | ICD-10-CM | POA: Diagnosis present

## 2020-07-17 DIAGNOSIS — I959 Hypotension, unspecified: Secondary | ICD-10-CM | POA: Diagnosis not present

## 2020-07-17 DIAGNOSIS — R0682 Tachypnea, not elsewhere classified: Secondary | ICD-10-CM

## 2020-07-17 DIAGNOSIS — I69391 Dysphagia following cerebral infarction: Secondary | ICD-10-CM

## 2020-07-17 DIAGNOSIS — R64 Cachexia: Secondary | ICD-10-CM | POA: Diagnosis not present

## 2020-07-17 DIAGNOSIS — E870 Hyperosmolality and hypernatremia: Secondary | ICD-10-CM | POA: Diagnosis present

## 2020-07-17 DIAGNOSIS — G8250 Quadriplegia, unspecified: Secondary | ICD-10-CM | POA: Diagnosis present

## 2020-07-17 DIAGNOSIS — R680 Hypothermia, not associated with low environmental temperature: Secondary | ICD-10-CM | POA: Diagnosis not present

## 2020-07-17 DIAGNOSIS — Z8546 Personal history of malignant neoplasm of prostate: Secondary | ICD-10-CM

## 2020-07-17 DIAGNOSIS — D649 Anemia, unspecified: Secondary | ICD-10-CM | POA: Diagnosis present

## 2020-07-17 DIAGNOSIS — E8809 Other disorders of plasma-protein metabolism, not elsewhere classified: Secondary | ICD-10-CM | POA: Diagnosis present

## 2020-07-17 DIAGNOSIS — M24561 Contracture, right knee: Secondary | ICD-10-CM | POA: Diagnosis present

## 2020-07-17 DIAGNOSIS — L089 Local infection of the skin and subcutaneous tissue, unspecified: Secondary | ICD-10-CM | POA: Diagnosis present

## 2020-07-17 DIAGNOSIS — E860 Dehydration: Secondary | ICD-10-CM | POA: Diagnosis present

## 2020-07-17 DIAGNOSIS — D72829 Elevated white blood cell count, unspecified: Secondary | ICD-10-CM

## 2020-07-17 DIAGNOSIS — Z681 Body mass index (BMI) 19 or less, adult: Secondary | ICD-10-CM | POA: Diagnosis not present

## 2020-07-17 DIAGNOSIS — I82B11 Acute embolism and thrombosis of right subclavian vein: Secondary | ICD-10-CM | POA: Diagnosis not present

## 2020-07-17 DIAGNOSIS — I10 Essential (primary) hypertension: Secondary | ICD-10-CM | POA: Diagnosis present

## 2020-07-17 DIAGNOSIS — R1310 Dysphagia, unspecified: Secondary | ICD-10-CM | POA: Diagnosis present

## 2020-07-17 DIAGNOSIS — Z515 Encounter for palliative care: Secondary | ICD-10-CM | POA: Diagnosis not present

## 2020-07-17 DIAGNOSIS — R627 Adult failure to thrive: Secondary | ICD-10-CM | POA: Diagnosis present

## 2020-07-17 DIAGNOSIS — K567 Ileus, unspecified: Secondary | ICD-10-CM | POA: Diagnosis not present

## 2020-07-17 DIAGNOSIS — R532 Functional quadriplegia: Secondary | ICD-10-CM | POA: Diagnosis present

## 2020-07-17 DIAGNOSIS — Z66 Do not resuscitate: Secondary | ICD-10-CM | POA: Diagnosis present

## 2020-07-17 DIAGNOSIS — Z20822 Contact with and (suspected) exposure to covid-19: Secondary | ICD-10-CM | POA: Diagnosis present

## 2020-07-17 DIAGNOSIS — R000 Tachycardia, unspecified: Secondary | ICD-10-CM | POA: Diagnosis present

## 2020-07-17 DIAGNOSIS — J9601 Acute respiratory failure with hypoxia: Secondary | ICD-10-CM | POA: Diagnosis not present

## 2020-07-17 DIAGNOSIS — Z8249 Family history of ischemic heart disease and other diseases of the circulatory system: Secondary | ICD-10-CM

## 2020-07-17 DIAGNOSIS — J9811 Atelectasis: Secondary | ICD-10-CM | POA: Diagnosis present

## 2020-07-17 DIAGNOSIS — Z7982 Long term (current) use of aspirin: Secondary | ICD-10-CM

## 2020-07-17 DIAGNOSIS — I152 Hypertension secondary to endocrine disorders: Secondary | ICD-10-CM | POA: Diagnosis present

## 2020-07-17 DIAGNOSIS — G809 Cerebral palsy, unspecified: Secondary | ICD-10-CM | POA: Diagnosis present

## 2020-07-17 DIAGNOSIS — R059 Cough, unspecified: Secondary | ICD-10-CM | POA: Diagnosis not present

## 2020-07-17 DIAGNOSIS — E785 Hyperlipidemia, unspecified: Secondary | ICD-10-CM | POA: Diagnosis present

## 2020-07-17 DIAGNOSIS — Z7189 Other specified counseling: Secondary | ICD-10-CM | POA: Diagnosis not present

## 2020-07-17 DIAGNOSIS — R600 Localized edema: Secondary | ICD-10-CM | POA: Diagnosis not present

## 2020-07-17 DIAGNOSIS — Z87891 Personal history of nicotine dependence: Secondary | ICD-10-CM

## 2020-07-17 DIAGNOSIS — R7989 Other specified abnormal findings of blood chemistry: Secondary | ICD-10-CM

## 2020-07-17 DIAGNOSIS — E46 Unspecified protein-calorie malnutrition: Secondary | ICD-10-CM

## 2020-07-17 DIAGNOSIS — R7401 Elevation of levels of liver transaminase levels: Secondary | ICD-10-CM | POA: Diagnosis not present

## 2020-07-17 HISTORY — DX: Gangrene, not elsewhere classified: I96

## 2020-07-17 LAB — CBC WITH DIFFERENTIAL/PLATELET
Abs Immature Granulocytes: 0.17 10*3/uL — ABNORMAL HIGH (ref 0.00–0.07)
Basophils Absolute: 0.1 10*3/uL (ref 0.0–0.1)
Basophils Relative: 0 %
Eosinophils Absolute: 0 10*3/uL (ref 0.0–0.5)
Eosinophils Relative: 0 %
HCT: 45.3 % (ref 39.0–52.0)
Hemoglobin: 14.5 g/dL (ref 13.0–17.0)
Immature Granulocytes: 1 %
Lymphocytes Relative: 2 %
Lymphs Abs: 0.5 10*3/uL — ABNORMAL LOW (ref 0.7–4.0)
MCH: 30.7 pg (ref 26.0–34.0)
MCHC: 32 g/dL (ref 30.0–36.0)
MCV: 95.8 fL (ref 80.0–100.0)
Monocytes Absolute: 0.8 10*3/uL (ref 0.1–1.0)
Monocytes Relative: 4 %
Neutro Abs: 21.2 10*3/uL — ABNORMAL HIGH (ref 1.7–7.7)
Neutrophils Relative %: 93 %
Platelets: 152 10*3/uL (ref 150–400)
RBC: 4.73 MIL/uL (ref 4.22–5.81)
RDW: 13.9 % (ref 11.5–15.5)
WBC: 22.8 10*3/uL — ABNORMAL HIGH (ref 4.0–10.5)
nRBC: 0 % (ref 0.0–0.2)

## 2020-07-17 LAB — COMPREHENSIVE METABOLIC PANEL
ALT: 66 U/L — ABNORMAL HIGH (ref 0–44)
AST: 58 U/L — ABNORMAL HIGH (ref 15–41)
Albumin: 2.3 g/dL — ABNORMAL LOW (ref 3.5–5.0)
Alkaline Phosphatase: 109 U/L (ref 38–126)
Anion gap: 9 (ref 5–15)
BUN: 23 mg/dL (ref 8–23)
CO2: 23 mmol/L (ref 22–32)
Calcium: 9.9 mg/dL (ref 8.9–10.3)
Chloride: 117 mmol/L — ABNORMAL HIGH (ref 98–111)
Creatinine, Ser: 0.84 mg/dL (ref 0.61–1.24)
GFR, Estimated: >60 mL/min (ref >60)
Glucose, Bld: 128 mg/dL — ABNORMAL HIGH (ref 70–99)
Potassium: 5.3 mmol/L — ABNORMAL HIGH (ref 3.5–5.1)
Sodium: 149 mmol/L — ABNORMAL HIGH (ref 135–145)
Total Bilirubin: 1.4 mg/dL — ABNORMAL HIGH (ref 0.3–1.2)
Total Protein: 6.7 g/dL (ref 6.5–8.1)

## 2020-07-17 LAB — RESP PANEL BY RT-PCR (FLU A&B, COVID) ARPGX2
Influenza A by PCR: NEGATIVE
Influenza B by PCR: NEGATIVE
SARS Coronavirus 2 by RT PCR: NEGATIVE

## 2020-07-17 LAB — PROTIME-INR
INR: 1.1 (ref 0.8–1.2)
Prothrombin Time: 13.7 seconds (ref 11.4–15.2)

## 2020-07-17 LAB — APTT: aPTT: 27 seconds (ref 24–36)

## 2020-07-17 LAB — LACTIC ACID, PLASMA
Lactic Acid, Venous: 3.5 mmol/L (ref 0.5–1.9)
Lactic Acid, Venous: 2.4 mmol/L (ref 0.5–1.9)

## 2020-07-17 MED ORDER — VANCOMYCIN HCL IN DEXTROSE 1-5 GM/200ML-% IV SOLN
1000.0000 mg | Freq: Once | INTRAVENOUS | Status: AC
  Administered 2020-07-17: 1000 mg via INTRAVENOUS
  Filled 2020-07-17: qty 200

## 2020-07-17 MED ORDER — SODIUM CHLORIDE 0.9 % IV SOLN
2.0000 g | Freq: Once | INTRAVENOUS | Status: AC
  Administered 2020-07-17: 2 g via INTRAVENOUS
  Filled 2020-07-17: qty 2

## 2020-07-17 MED ORDER — LACTATED RINGERS IV BOLUS
2000.0000 mL | Freq: Once | INTRAVENOUS | Status: AC
  Administered 2020-07-17: 2000 mL via INTRAVENOUS

## 2020-07-17 MED ORDER — ACETAMINOPHEN 650 MG RE SUPP: 650.0000 mg | Freq: Four times a day (QID) | RECTAL | Status: DC | PRN

## 2020-07-17 MED ORDER — ACETAMINOPHEN 325 MG PO TABS
650.0000 mg | ORAL_TABLET | Freq: Four times a day (QID) | ORAL | Status: DC | PRN
  Administered 2020-07-21: 650 mg via ORAL
  Filled 2020-07-17: qty 2

## 2020-07-17 NOTE — H&P (Addendum)
History and Physical    PLEASE NOTE THAT DRAGON DICTATION SOFTWARE WAS USED IN THE CONSTRUCTION OF THIS NOTE.   Rick Schwartz SWN:[id number] DOB: 1940-12-12 DOA: 07/17/2020  PCP: Matilde Haymaker, MD Patient coming from: home   I have personally briefly reviewed patient's old medical records in [place]  Chief Complaint: right foot ulcer  HPI: Rick Schwartz is a 80 y.o. male with medical history significant for HTN, PAD, cerebral palsy, non-verbal at baseline, who is admitted to [facility] on 07/17/2020 with severe sepsis due to infected right foot ulcer after presenting from home to [facility] ED for evaluation of right foot ulcer.  In setting of patient's cerebral palsy and baseline non-verbal status, the following history is provided via my discussions with the patient's sister [name]), who was present at bedside, in addition to my discussions with the emergency department physician and via chart review.  The patient's history conveys that the patient lives at home with a 24/7 caregiver.  However, family, who had not seen the patient in over a week came to visit today, and noted interval development a right foot ulcer that appeared erythematous and draining serous fluid.  Sister is unsure as to the specific timeframe in which this ulcerative lesion would have occurred, as the caregiver at not conveyed previously noting this lesion.  There is associated dark coloration of multiple toes of the right foot, with an unclear timeframe of development, although the patient's sister conveys that he has been several months at least since family members will observe the patient's foot, complicating identification of timeframe associated with these findings.  No reported recent trauma involving the right lower extremity.  No known fever at home.  No recent vomiting, diarrhea, melena, or hematochezia reported.  No evidence of chills or full body rigors.  Denies any wheezing or cough.  And the patient  reportedly has no known recent COVID-19 exposures.  It appears that the patient is on a daily baby aspirin as a sole outpatient blood thinning agent, with most recent dose of such occurring on the morning of 07/17/2020.  He also has a documented history of essential hypertension, for which she is now managed via lifestyle modifications in the absence of any pharmacologic intervention.  No known history of coronary artery disease or congestive heart failure.  The patient's sister confirms the patient's CODE STATUS is DNR/DNI.       ED Course:  Vital signs in the ED were notable for the following: Tetramex 97.8, initial heart rate 97, which decreased to 84 following interval IV fluids, as further described below; blood pressure 125/81 -141/78; respiratory rate 16 oxygen saturation 100% on room air.  Labs were notable for the following: CMP notable for the following: Sodium 149, chloride 117, bicarbonate 23, anion gap 9 BUN 23, creatinine 0.4, glucose 120.  Calcium 9.9, which is adjusted to 11.3 when taking into account concomitant hypoalbuminemia of 2.3.  CBC notable for white blood cell count of 23,000 with 93% neutrophils.  Initial lactate 3.5, which is trended down to 2.4 following interval IV fluids, as quantified below.  Screening nasopharyngeal COVID-19/influenza PCR performed in the ED today found to be negative.  EKG interpretation was limited by artifact, with repeat EKG ordered at this time.  Chest x-ray showed no evidence of acute cardiopulmonary process.  Plain films of the right foot demonstrated gas in the plantar soft tissue of foot highly suspicious for soft tissue infection in the absence of overt evidence of  acute osteomyelitis.   EDP discussed the patient's case and imaging with the on-call orthopedic surgery PA, McBane, who is working with Dr. Griffin Basil.  Orthopedic surgery agrees to formally consult, and plans to evaluate the patient in the morning, and was requested interval IV  antibiotics as well as making the patient n.p.o. after midnight for potential surgical intervention.   While in the ED, the following were administered: IV vancomycin x1, cefepime x1 dose, and a 2 L lactated Ringer bolus.      Review of Systems: As per HPI otherwise 10 point review of systems negative.   Past Medical History:  Diagnosis Date  . Cancer [facility])    Prostate  . Cerebral palsy ([place])   . Hyperlipidemia   . Hypertension   . Peripheral arterial disease ([place])    critical limb ischemia    Past Surgical History:  Procedure Laterality Date  . PROSTATE SURGERY      Social History:  reports that he quit smoking about 25 years ago. He has a 8.75 pack-year smoking history. He has never used smokeless tobacco. He reports current alcohol use. He reports that he does not use drugs.   No Known Allergies  Family History  Problem Relation Age of Onset  . Diabetes Mother   . Hypertension Mother   . Hypertension Father   . Diabetes Sister   . Hypertension Sister   . Diabetes Brother   . Hypertension Brother   . Diabetes Sister   . Thyroid disease Sister   . Sudden death Brother      Prior to Admission medications   Medication Sig Start Date End Date Taking? Authorizing Provider  aspirin EC 81 MG tablet Take 81 mg by mouth at bedtime. Swallow whole.   Yes [provider]  potassium chloride SA (KLOR-CON) 20 MEQ tablet TAKE 1 TABLET(20 MEQ) BY MOUTH 1 TIME FOR 1 DOSE Patient taking differently: Take 20 mEq by mouth daily. 06/23/20  Yes Matilde Haymaker, MD  lisinopril-hydrochlorothiazide (ZESTORETIC) 20-25 MG tablet TAKE 1 TABLET BY MOUTH DAILY Patient not taking: Reported on 07/17/2020 06/23/20   Matilde Haymaker, MD  metoprolol tartrate (LOPRESSOR) 100 MG tablet TAKE 2 TABLETS(200 MG) BY MOUTH TWICE DAILY Patient not taking: Reported on 07/17/2020 02/15/20   Matilde Haymaker, MD  simvastatin (ZOCOR) 40 MG tablet Take 1 tablet (40 mg total) by mouth at bedtime. Patient not taking:  Reported on 07/17/2020 11/20/18   Matilde Haymaker, MD     Objective    Physical Exam: Vitals:   07/17/20 1632 07/17/20 1732 07/17/20 1800 07/17/20 2031  BP: 125/81  (!) 141/78 (!) 151/61  Pulse: 97  93 84  Resp: $Remo'16  16 16  'QPWBO$ Temp: 97.8 F (36.6 C)   97.7 F (36.5 C)  TempSrc: Oral   Oral  SpO2: 100%  100% 100%  Weight:  43.1 kg    Height:  $Remove'5\' 6"'Hsyzqgw$  (1.676 m)      General: appears to be stated age; alert; nonverbal (baseline) Skin: warm, plantar ulcer on lateral aspect of the right foot associated with swelling, erythema, with evidence of multiple black appearing toes Head:  AT/[place] Mouth:  Oral mucosa membranes appear dry, normal dentition Neck: supple; trachea midline Heart:  RRR; did not appreciate any M/R/G Lungs: CTAB, did not appreciate any wheezes, rales, or rhonchi Abdomen: + BS; soft, ND, NT Extremities: no peripheral edema, no muscle wasting Neuro: In the setting of the patient's reported baseline mental status in the context of cerebral palsy, unable  to perform full assessment of strength or sensation or cranial nerve evaluation.     Labs on Admission: I have personally reviewed following labs and imaging studies  CBC: Recent Labs  Lab 07/14/20 0950 07/17/20 1641  WBC 22.8* 22.8*  NEUTROABS  --  21.2*  HGB 16.0 14.5  HCT 47.2 45.3  MCV 89 95.8  PLT 284 161   Basic Metabolic Panel: Recent Labs  Lab 07/14/20 0950 07/17/20 1641  NA 145* 149*  K 4.4 5.3*  CL 106 117*  CO2 19* 23  GLUCOSE 133* 128*  BUN 23 23  CREATININE 0.83 0.84  CALCIUM 10.0 9.9   GFR: Estimated Creatinine Clearance: 43.5 mL/min (by C-G formula based on SCr of 0.84 mg/dL). Liver Function Tests: Recent Labs  Lab 07/14/20 0950 07/17/20 1641  AST 68* 58*  ALT 62* 66*  ALKPHOS 127* 109  BILITOT 0.8 1.4*  PROT 6.2 6.7  ALBUMIN 2.6* 2.3*   No results for input(s): LIPASE, AMYLASE in the last 168 hours. No results for input(s): AMMONIA in the last 168 hours. Coagulation  Profile: Recent Labs  Lab 07/17/20 1641  INR 1.1   Cardiac Enzymes: No results for input(s): CKTOTAL, CKMB, CKMBINDEX, TROPONINI in the last 168 hours. BNP (last 3 results) No results for input(s): PROBNP in the last 8760 hours. HbA1C: No results for input(s): HGBA1C in the last 72 hours. CBG: No results for input(s): GLUCAP in the last 168 hours. Lipid Profile: No results for input(s): CHOL, HDL, LDLCALC, TRIG, CHOLHDL, LDLDIRECT in the last 72 hours. Thyroid Function Tests: No results for input(s): TSH, T4TOTAL, FREET4, T3FREE, THYROIDAB in the last 72 hours. Anemia Panel: No results for input(s): VITAMINB12, FOLATE, FERRITIN, TIBC, IRON, RETICCTPCT in the last 72 hours. Urine analysis:    Component Value Date/Time   BILIRUBINUR NEGATIVE 06/22/2013 1148   PROTEINUR NEGATIVE 06/22/2013 1148   UROBILINOGEN 1.0 06/22/2013 1148   NITRITE NEGATIVE 06/22/2013 1148   LEUKOCYTESUR Negative 06/22/2013 1148    Radiological Exams on Admission: DG Chest Port 1 View  Result Date: 07/17/2020 CLINICAL DATA:  Sepsis EXAM: PORTABLE CHEST 1 VIEW COMPARISON:  09/24/2007 FINDINGS: 2 frontal views of the chest are obtained. Evaluation is limited due to right upper extremity contractures obscuring portions of the right hemithorax. Cardiac silhouette is unremarkable. No airspace disease, effusion, or pneumothorax. There are 2 metallic densities projecting over the left chest, which may be on the patient's clothing. No acute bony abnormalities. IMPRESSION: 1. No acute intrathoracic process. Electronically Signed   By: Randa Ngo M.D.   On: 07/17/2020 17:56   DG Foot 2 Views Right  Result Date: 07/17/2020 CLINICAL DATA:  80 year old male with ischemic RIGHT foot with oozing and discoloration. EXAM: RIGHT FOOT - 2 VIEW COMPARISON:  None. FINDINGS: Gas within the plantar soft tissues of the foot noted highly suspicious for soft tissue infection. No radiographic evidence of acute osteomyelitis noted.  Heavy vascular calcifications are present. No acute fracture or dislocation noted. Chronic changes of the 2nd toe proximal phalanx noted. IMPRESSION: Gas within the plantar soft tissues of the foot highly suspicious for soft tissue infection. No radiographic evidence of acute osteomyelitis. Consider MRI as indicated. Electronically Signed   By: Margarette Canada M.D.   On: 07/17/2020 18:04      Assessment/Plan   Rick Schwartz is a 80 y.o. male with medical history significant for HTN, PAD, cerebral palsy, non-verbal at baseline, who is admitted to [facility] on 07/17/2020 with severe sepsis due  to infected right foot ulcer after presenting from home to [facility] ED for evaluation of right foot ulcer.   Principal Problem:   Foot ulcer with necrosis of muscle, right (HCC) Active Problems:   HYPERTENSION, BENIGN SYSTEMIC   Necrotic toes (HCC)   Severe sepsis (HCC)   Lactic acidosis   Hypernatremia   Hypercalcemia    #) Severe sepsis due to infected right foot ulcer: lateral right plantar foot ulcer associated with erythema, mild swelling, and serous drainage, suggestion of distal necrotic appearance, as further outlined above, with unclear timeframe for development, with plain films of the right foot suggesting cellulitis in the context confirmed evidence of soft tissue in the absence of overt radiographic evidence of acute osteomyelitis. SIRS criteria met via presenting leukocytosis and tachycardia.  Patient sepsis meets criteria to be considered severe nature on the basis of concomitant evidence of endorgan endorgan damage in the form of elevated initial lactate of 3.5, which subsequently trended down to 2.4 following interval IV fluids.  Presentation is not associate with any hypotension.  No documentation of history of diabetes.  In the setting of clinical evidence to suggest areas of necrosis associated with the right foot, the patient's case and imaging were discussed with orthopedic surgery, as  above, agreed to consult and will evaluate the patient in the morning (07/17/20), with interval recommendations for IV exam to make patient n.p.o. at midnight.  No other source of underlying infection identified at this time, including chest x-ray showed no evidence of pneumonia, while COVID-19 PCR performed in the ED today was found to be negative.  Urinalysis has been ordered, with result currently pending.  In the setting of the patient's cerebral palsy, there would be some difficulty in obtaining MRI of the foot to further evaluate for underlying osteomyelitis, although this additional imaging has not been requested by orthopedic surgery.  Blood cultures collected in the ED today prior to initiation of IV vancomycin and cefepime.  We will continue IV fluids we will continue to trend elevated lactate level, however, in the setting of presenting hypernatremia, will change fluids to D5 1/2 NS, with plan to closely monitor ensuing serum sodium value.    Plan: Repeat lactic acid level in the morning. D5 1/2 NS @ 75 cc/hr , as further outlined above.  Continue IV vancomycin.  Rocephin.  Monitor for results of blood cultures collected in the ED today.  Orthopedic surgery consulted, as above.  NPO.  Repeat CBC with differential in the morning.  Follow for results of UA.  Preop EKG ordered in the setting of interpretation of initial EKG limited by the presence of motion artifact.  Hold home aspirin.  Check CRP and ESR.     #) Hypernatremia: Serum sodium noted to be 149, relative to 145 on 07/14/2020, and 144 on 4 21,022.  Suspect strong contribution from dehydration given markers for such.  Of note, patient received 2 L LR bolus in the ED today.  Subsequent to these fluid boluses, will change IV fluids D5 1/2 NS to attempt to residual total body water deficit, while maintaining a higher degree of renal protective capabilities in the setting of elevated lactic acid level, relative to changing to D5 or D10 water.   Closely monitor ensuing serum sodium level, with possibility of additional IV fluid changes based upon the ensuing trend of this value.]  Plan: IV fluids, as above.  Repeat BMP in the morning.  Monitor strict I's and O's and daily weights.  Add on  random urine sodium as well as urine osmolality.      #) Hypercalcemia: Adjusted serum calcium 11.3, which is identical to most recent prior value from 07/07/2020.  Suspect contribution from dehydration, as above.  We will plan for interval IV fluid resuscitation, with repeat serum calcium level in the morning.  Hypercalcemia persistent despite resuscitative measures, can consider further expanding work-up for hypercalcemia to include evaluation of intact PTH.  Does not appear to be on any medications at home that would be associated with hypercalcemia.  Of note, the patient was previously on HCTZ at home, although there is documentation that he is no longer on this medication.  Plan: IV fluids, as above.  Monitor strict I's and O's daily weights.  Check serum phosphorus and magnesium levels.  Check ionized calcium level. CMP in the AM. Will ensure that HCTZ is not resumed at the time of discharge.        #) Essential hypertension: Documented history of such.  It appears that this is managed via lifestyle modifications, with the patient no longer taking his prior lisinopril and HCTZ.  Of note, blood pressures in the ED have been found to be normotensive, without any evidence hypotensive findings, in spite of presenting severe sepsis.   Plan: Close monitoring of ensuing blood pressure via routine vital signs.      DVT prophylaxis: SCDs Code Status: DNR/DNI (confirmed by the patient's sister, who was present at bedside) Family Communication: Case was discussed with the patient's sister, as above Disposition Plan: Per Rounding Team Consults called: On-call orthopedic surgery consulted, as further detailed above.  Admission status: Inpatient; med  telemetry.     Of note, this patient was added by me to the following Admit List/Treatment Team: [email or web address].      PLEASE NOTE THAT DRAGON DICTATION SOFTWARE WAS USED IN THE CONSTRUCTION OF THIS NOTE.   [facility] Pager [phone] From [place]  Otherwise, please contact night-coverage  [email or web address] Password [facility]   07/17/2020, 11:34 PM

## 2020-07-17 NOTE — ED Provider Notes (Signed)
[place] DEPT Provider Note   CSN: [id number] Arrival date & time: 07/17/20  1616     History Chief Complaint  Patient presents with  . Wound Infection    Rick Schwartz is a 80 y.o. male with a history of cerebral palsy, hypertension, hyperlipidemia, presented to the ED with right foot infection.  EMS reports that a family member checked on the patient at home and noticed a foul odor.  They checked his foot and it was black from the right first to third toe.    Reportedly the patient's PCP took him off all medications at home except magnesium.  Patient himself cannot provide any reliable history due to his mental disability.  I spoke to Gretchen Short his sister, who is here at bedside.  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  She says he is nonambulatory at baseline.  She said she went to visit him today and noted a foul smell, the concern of necrotic toes.  She is unaware how long this may be happening.  She confirms to me that the patient is only on a single medicine, potassium, and is no longer taking any of his other medications per the preference of his PCP.  HPI     Past Medical History:  Diagnosis Date  . Cancer [facility])    Prostate  . Cerebral palsy ([place])   . Hyperlipidemia   . Hypertension   . Peripheral arterial disease ([place])    critical limb ischemia    Patient Active Problem List   Diagnosis Date Noted  . Necrotic toes ([place]) 07/17/2020  . Focal motor deficit 07/07/2020  . Physical deconditioning 05/06/2020  . Prediabetes 07/20/2016  . 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 GLUCOSE INTOLERANCE 05/16/2006  . HYPERCHOLESTEROLEMIA 05/16/2006  . Infantile cerebral palsy ([place]) 05/16/2006  . HYPERTENSION, BENIGN SYSTEMIC 05/16/2006  . BPH 05/16/2006    Past Surgical History:  Procedure Laterality Date  . PROSTATE SURGERY         Family History  Problem Relation Age of  Onset  . Diabetes Mother   . Hypertension Mother   . Hypertension Father   . Diabetes Sister   . Hypertension Sister   . Diabetes Brother   . Hypertension Brother   . Diabetes Sister   . Thyroid disease Sister   . Sudden death Brother     Social History   Tobacco Use  . Smoking status: Former Smoker    Packs/day: 0.25    Years: 35.00    Pack years: 8.75    Quit date: 10/12/1994    Years since quitting: 25.7  . Smokeless tobacco: Never Used  Substance Use Topics  . Alcohol use: Yes    Comment: Occasional beer and gin  . Drug use: No    Home Medications Prior to Admission medications   Medication Sig Start Date End Date Taking? Authorizing Provider  aspirin EC 81 MG tablet Take 81 mg by mouth at bedtime. Swallow whole.   Yes [provider]  potassium chloride SA (KLOR-CON) 20 MEQ tablet TAKE 1 TABLET(20 MEQ) BY MOUTH 1 TIME FOR 1 DOSE Patient taking differently: Take 20 mEq by mouth daily. 06/23/20  Yes Matilde Haymaker, MD  lisinopril-hydrochlorothiazide (ZESTORETIC) 20-25 MG tablet TAKE 1 TABLET BY MOUTH DAILY Patient not taking: Reported on 07/17/2020 06/23/20   Matilde Haymaker, MD  metoprolol tartrate (LOPRESSOR) 100 MG tablet TAKE 2 TABLETS(200 MG) BY MOUTH TWICE  DAILY Patient not taking: Reported on 07/17/2020 02/15/20   Matilde Haymaker, MD  simvastatin (ZOCOR) 40 MG tablet Take 1 tablet (40 mg total) by mouth at bedtime. Patient not taking: Reported on 07/17/2020 11/20/18   Matilde Haymaker, MD    Allergies    Patient has no known allergies.  Review of Systems   Review of Systems  Unable to perform ROS: Patient nonverbal (level 5 caveat)    Physical Exam Updated Vital Signs BP (!) 151/61 (BP Location: Left Arm)   Pulse 84   Temp 97.7 F (36.5 C) (Oral)   Resp 16   Ht 5\' 6"  (1.676 m)   Wt 43.1 kg   SpO2 100%   BMI 15.33 kg/m   Physical Exam Constitutional:        General: He is not in acute distress.    Comments: Thin, frail  HENT:     Head: Normocephalic and  atraumatic.     Comments: Chronic cyst above right eyebrow (for 20 years per sister's report) Tachy mucous membranes Eyes:     Conjunctiva/sclera: Conjunctivae normal.     Pupils: Pupils are equal, round, and reactive to light.  Cardiovascular:     Rate and Rhythm: Normal rate and regular rhythm.     Comments: No pedal pulse right foot Pulmonary:     Effort: Pulmonary effort is normal. No respiratory distress.  Abdominal:     General: There is no distension.     Tenderness: There is no abdominal tenderness.  Musculoskeletal:     Comments: Right arm contractured Right toes 1-3 necrotic, see photo  Skin:    General: Skin is warm and dry.  Neurological:     General: No focal deficit present.     Mental Status: He is alert. Mental status is at baseline.     ED Results / Procedures / Treatments   Labs (all labs ordered are listed, but only abnormal results are displayed) Labs Reviewed  LACTIC ACID, PLASMA - Abnormal; Notable for the following components:      Result Value   Lactic Acid, Venous 3.5 (*)    All other components within normal limits  LACTIC ACID, PLASMA - Abnormal; Notable for the following components:   Lactic Acid, Venous 2.4 (*)    All other components within normal limits  COMPREHENSIVE METABOLIC PANEL - Abnormal; Notable for the following components:   Sodium 149 (*)    Potassium 5.3 (*)    Chloride 117 (*)    Glucose, Bld 128 (*)    Albumin 2.3 (*)    AST 58 (*)    ALT 66 (*)    Total Bilirubin 1.4 (*)    All other components within normal limits  CBC WITH DIFFERENTIAL/PLATELET - Abnormal; Notable for the following components:   WBC 22.8 (*)    Neutro Abs 21.2 (*)    Lymphs Abs 0.5 (*)    Abs Immature Granulocytes 0.17 (*)    All other components within normal limits  RESP PANEL BY RT-PCR (FLU A&B, COVID) ARPGX2  CULTURE, BLOOD (SINGLE)  URINE CULTURE  CULTURE, BLOOD (SINGLE)  PROTIME-INR  APTT  URINALYSIS, ROUTINE W REFLEX MICROSCOPIC     EKG EKG Interpretation  Date/Time:  Sunday Jul 17 2020 17:50:13 EDT Ventricular Rate:  93 PR Interval:  149 QRS Duration: 80 QT Interval:  419 QTC Calculation: 513 R Axis:   45 Text Interpretation: Sinus rhythm Ventricular bigeminy Low voltage, precordial leads Anteroseptal infarct, age indeterminate Nonspecific T abnormalities, lateral  leads Prolonged QT interval Artifact in lead(s) I II III aVR aVL aVF V1 V2 Confirmed by Octaviano Glow [phone]) on 07/17/2020 5:55:19 PM   Radiology DG Chest Port 1 View  Result Date: 07/17/2020 CLINICAL DATA:  Sepsis EXAM: PORTABLE CHEST 1 VIEW COMPARISON:  09/24/2007 FINDINGS: 2 frontal views of the chest are obtained. Evaluation is limited due to right upper extremity contractures obscuring portions of the right hemithorax. Cardiac silhouette is unremarkable. No airspace disease, effusion, or pneumothorax. There are 2 metallic densities projecting over the left chest, which may be on the patient's clothing. No acute bony abnormalities. IMPRESSION: 1. No acute intrathoracic process. Electronically Signed   By: Randa Ngo M.D.   On: 07/17/2020 17:56   DG Foot 2 Views Right  Result Date: 07/17/2020 CLINICAL DATA:  80 year old male with ischemic RIGHT foot with oozing and discoloration. EXAM: RIGHT FOOT - 2 VIEW COMPARISON:  None. FINDINGS: Gas within the plantar soft tissues of the foot noted highly suspicious for soft tissue infection. No radiographic evidence of acute osteomyelitis noted. Heavy vascular calcifications are present. No acute fracture or dislocation noted. Chronic changes of the 2nd toe proximal phalanx noted. IMPRESSION: Gas within the plantar soft tissues of the foot highly suspicious for soft tissue infection. No radiographic evidence of acute osteomyelitis. Consider MRI as indicated. Electronically Signed   By: Margarette Canada M.D.   On: 07/17/2020 18:04    Procedures .Critical Care Performed by: Wyvonnia Dusky, MD Authorized by:  Wyvonnia Dusky, MD   Critical care provider statement:    Critical care time (minutes):  45   Critical care was necessary to treat or prevent imminent or life-threatening deterioration of the following conditions:  Sepsis   Critical care was time spent personally by me on the following activities:  Discussions with consultants, evaluation of patient's response to treatment, examination of patient, ordering and performing treatments and interventions, ordering and review of laboratory studies, ordering and review of radiographic studies, pulse oximetry, re-evaluation of patient's condition, obtaining history from patient or surrogate and review of old charts     Medications Ordered in ED Medications  acetaminophen (TYLENOL) tablet 650 mg (has no administration in time range)    Or  acetaminophen (TYLENOL) suppository 650 mg (has no administration in time range)  lactated ringers bolus 2,000 mL (2,000 mLs Intravenous New Bag/Given 07/17/20 1651)  ceFEPIme (MAXIPIME) 2 g in sodium chloride 0.9 % 100 mL IVPB (0 g Intravenous Stopped 07/17/20 1800)  vancomycin (VANCOCIN) IVPB 1000 mg/200 mL premix (1,000 mg Intravenous New Bag/Given 07/17/20 1758)    ED Course  I have reviewed the triage vital signs and the nursing notes.  Pertinent labs & imaging results that were available during my care of the patient were reviewed by me and considered in my medical decision making (see chart for details).  This patient complains of right foot discoloration, toe infection. This involves an extensive number of treatment options, and is a complaint that carries with it a high risk of complications and morbidity.  The differential diagnosis includes necrosis vs osteomyelitis vs other   I ordered, reviewed, and interpreted labs. NA elevated 149.  Lactate 3.5.  WBC 22.8.  Alb low 2.3.  K 5.3.   I ordered medication IV vanco, IV cefepime, IV fluids for sepsis and osteomyelitis I ordered imaging studies which  included dg right foot, dg chest I independently visualized and interpreted imaging which showed no life-threatening abnormalities, and the monitor tracing which showed NSR Additional  history was obtained from patient's sister I consulted orthopedics as noted below. ECG reviewed, nonischemic per my interpretation    Clinical Course as of 07/17/20 2356  Sun Jul 17, 2020  1728 Lactic Acid, Venous(!!): 3.5 [MT]  1728 WBC(!): 22.8 [MT]  1729 IV vanco + cefepime ordered [MT]  1751 I spoke to Noemi Chapel from orthopedics, consult placed, she asked for NPO at midnight for now, although they have not yet decided on operative options. [name] now at bedside and updated about diagnosis. [MT]  [place] Admitted to hospitalist [MT]    Clinical Course User Index [MT] Ichelle Harral, Carola Rhine, MD    Final Clinical Impression(s) / ED Diagnoses Final diagnoses:  Gangrene of right foot [facility])    Rx / DC Orders ED Discharge Orders    None       Langston Masker Carola Rhine, MD 07/17/20 2356

## 2020-07-17 NOTE — ED Triage Notes (Addendum)
Pt has CP from home, has home health aid that is a family member takes care of all ADLs. Another family member noticed today a odor and he c/o right foot pain. Foot is black from right great toe to third, anterior and posterior discolored and oozing. EMS unable to find pulse. Mentation is baseline. PCP took him off all home meds except Mag r/t hypotension.

## 2020-07-17 NOTE — Progress Notes (Signed)
A consult was received from an ED physician for vancomycin & cefpime per pharmacy dosing.  The patient's profile has been reviewed for ht/wt/allergies/indication/available labs.   A one time order has been placed for vancomycin 1 gm & cefepime 2 gm    Further antibiotics/pharmacy consults should be ordered by admitting physician if indicated.                       Thank you, Eudelia Bunch, Pharm.D 07/17/2020 5:36 PM

## 2020-07-18 ENCOUNTER — Other Ambulatory Visit: Payer: Self-pay | Admitting: Physician Assistant

## 2020-07-18 ENCOUNTER — Encounter (HOSPITAL_COMMUNITY): Payer: Self-pay | Admitting: Internal Medicine

## 2020-07-18 ENCOUNTER — Inpatient Hospital Stay (HOSPITAL_COMMUNITY): Payer: Medicare Other

## 2020-07-18 DIAGNOSIS — E46 Unspecified protein-calorie malnutrition: Secondary | ICD-10-CM

## 2020-07-18 DIAGNOSIS — A419 Sepsis, unspecified organism: Secondary | ICD-10-CM | POA: Diagnosis not present

## 2020-07-18 DIAGNOSIS — R6520 Severe sepsis without septic shock: Secondary | ICD-10-CM

## 2020-07-18 DIAGNOSIS — E872 Acidosis, unspecified: Secondary | ICD-10-CM | POA: Diagnosis present

## 2020-07-18 DIAGNOSIS — L97513 Non-pressure chronic ulcer of other part of right foot with necrosis of muscle: Secondary | ICD-10-CM

## 2020-07-18 DIAGNOSIS — E43 Unspecified severe protein-calorie malnutrition: Secondary | ICD-10-CM

## 2020-07-18 DIAGNOSIS — I96 Gangrene, not elsewhere classified: Secondary | ICD-10-CM | POA: Diagnosis not present

## 2020-07-18 DIAGNOSIS — E870 Hyperosmolality and hypernatremia: Secondary | ICD-10-CM | POA: Diagnosis present

## 2020-07-18 HISTORY — DX: Hypercalcemia: E83.52

## 2020-07-18 HISTORY — DX: Non-pressure chronic ulcer of other part of right foot with necrosis of muscle: L97.513

## 2020-07-18 HISTORY — DX: Hyperosmolality and hypernatremia: E87.0

## 2020-07-18 HISTORY — DX: Sepsis, unspecified organism: A41.9

## 2020-07-18 LAB — CBC WITH DIFFERENTIAL/PLATELET
Abs Immature Granulocytes: 0.21 10*3/uL — ABNORMAL HIGH (ref 0.00–0.07)
Basophils Absolute: 0 10*3/uL (ref 0.0–0.1)
Basophils Relative: 0 %
Eosinophils Absolute: 0 10*3/uL (ref 0.0–0.5)
Eosinophils Relative: 0 %
HCT: 42.7 % (ref 39.0–52.0)
Hemoglobin: 13.4 g/dL (ref 13.0–17.0)
Immature Granulocytes: 1 %
Lymphocytes Relative: 2 %
Lymphs Abs: 0.6 10*3/uL — ABNORMAL LOW (ref 0.7–4.0)
MCH: 30.7 pg (ref 26.0–34.0)
MCHC: 31.4 g/dL (ref 30.0–36.0)
MCV: 97.7 fL (ref 80.0–100.0)
Monocytes Absolute: 0.8 10*3/uL (ref 0.1–1.0)
Monocytes Relative: 3 %
Neutro Abs: 22.2 10*3/uL — ABNORMAL HIGH (ref 1.7–7.7)
Neutrophils Relative %: 94 %
Platelets: 150 10*3/uL (ref 150–400)
RBC: 4.37 MIL/uL (ref 4.22–5.81)
RDW: 13.7 % (ref 11.5–15.5)
WBC: 23.8 10*3/uL — ABNORMAL HIGH (ref 4.0–10.5)
nRBC: 0 % (ref 0.0–0.2)

## 2020-07-18 LAB — LACTIC ACID, PLASMA
Lactic Acid, Venous: 2.4 mmol/L (ref 0.5–1.9)
Lactic Acid, Venous: 2.4 mmol/L (ref 0.5–1.9)

## 2020-07-18 LAB — COMPREHENSIVE METABOLIC PANEL
ALT: 50 U/L — ABNORMAL HIGH (ref 0–44)
AST: 43 U/L — ABNORMAL HIGH (ref 15–41)
Albumin: 2 g/dL — ABNORMAL LOW (ref 3.5–5.0)
Alkaline Phosphatase: 100 U/L (ref 38–126)
Anion gap: 7 (ref 5–15)
BUN: 19 mg/dL (ref 8–23)
CO2: 24 mmol/L (ref 22–32)
Calcium: 9.1 mg/dL (ref 8.9–10.3)
Chloride: 114 mmol/L — ABNORMAL HIGH (ref 98–111)
Creatinine, Ser: 0.68 mg/dL (ref 0.61–1.24)
GFR, Estimated: >60 mL/min (ref >60)
Glucose, Bld: 99 mg/dL (ref 70–99)
Potassium: 4.4 mmol/L (ref 3.5–5.1)
Sodium: 145 mmol/L (ref 135–145)
Total Bilirubin: 1.5 mg/dL — ABNORMAL HIGH (ref 0.3–1.2)
Total Protein: 5.9 g/dL — ABNORMAL LOW (ref 6.5–8.1)

## 2020-07-18 LAB — OSMOLALITY, URINE: Osmolality, Ur: 820 mOsm/kg (ref 300–900)

## 2020-07-18 LAB — PHOSPHORUS: Phosphorus: 2.4 mg/dL — ABNORMAL LOW (ref 2.5–4.6)

## 2020-07-18 LAB — C-REACTIVE PROTEIN: CRP: 14.8 mg/dL — ABNORMAL HIGH (ref <1.0)

## 2020-07-18 LAB — SEDIMENTATION RATE: Sed Rate: 80 mm/hr — ABNORMAL HIGH (ref 0–16)

## 2020-07-18 LAB — SODIUM, URINE, RANDOM: Sodium, Ur: 49 mmol/L

## 2020-07-18 LAB — MAGNESIUM: Magnesium: 1.6 mg/dL — ABNORMAL LOW (ref 1.7–2.4)

## 2020-07-18 MED ORDER — SODIUM CHLORIDE 0.9 % IV SOLN
2.0000 g | INTRAVENOUS | Status: DC
  Administered 2020-07-18 – 2020-07-20 (×3): 2 g via INTRAVENOUS
  Filled 2020-07-18 (×3): qty 20
  Filled 2020-07-18: qty 2

## 2020-07-18 MED ORDER — DEXTROSE-NACL 5-0.45 % IV SOLN: INTRAVENOUS | Status: DC

## 2020-07-18 MED ORDER — JUVEN PO PACK
1.0000 | PACK | Freq: Two times a day (BID) | ORAL | Status: DC
  Administered 2020-07-18 – 2020-07-19 (×3): 1 via ORAL
  Filled 2020-07-18 (×6): qty 1

## 2020-07-18 MED ORDER — MAGNESIUM SULFATE 2 GM/50ML IV SOLN
2.0000 g | Freq: Once | INTRAVENOUS | Status: AC
  Administered 2020-07-18: 2 g via INTRAVENOUS
  Filled 2020-07-18: qty 50

## 2020-07-18 MED ORDER — ENSURE ENLIVE PO LIQD
237.0000 mL | Freq: Three times a day (TID) | ORAL | Status: DC
  Administered 2020-07-18 – 2020-07-20 (×5): 237 mL via ORAL

## 2020-07-18 MED ORDER — ADULT MULTIVITAMIN W/MINERALS CH
1.0000 | ORAL_TABLET | Freq: Every day | ORAL | Status: DC
  Administered 2020-07-18 – 2020-07-19 (×2): 1 via ORAL
  Filled 2020-07-18 (×3): qty 1

## 2020-07-18 MED ORDER — PROSOURCE PLUS PO LIQD
30.0000 mL | Freq: Three times a day (TID) | ORAL | Status: DC
  Administered 2020-07-18 – 2020-07-20 (×7): 30 mL via ORAL
  Filled 2020-07-18 (×9): qty 30

## 2020-07-18 MED ORDER — VANCOMYCIN HCL 500 MG/100ML IV SOLN
500.0000 mg | INTRAVENOUS | Status: DC
  Administered 2020-07-18 – 2020-07-23 (×6): 500 mg via INTRAVENOUS
  Filled 2020-07-18 (×8): qty 100

## 2020-07-18 NOTE — H&P (View-Only) (Signed)
 ORTHOPAEDIC CONSULTATION  REQUESTING PHYSICIAN: Ezenduka, Nkeiruka J, MD  Chief Complaint: Dry gangrene right foot.  HPI: Rick Schwartz is a 80 y.o. 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  Patient lives at home.  Patient has been undergoing recent work-up for stroke.  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  Past Medical History:  Diagnosis Date  . Cancer (HCC)    Prostate  . Cerebral palsy (HCC)   . Hyperlipidemia   . Hypertension   . Peripheral arterial disease (HCC)    critical limb ischemia   Past Surgical History:  Procedure Laterality Date  . PROSTATE SURGERY     Social History   Socioeconomic History  . Marital status: Single    Spouse name: Not on file  . Number of children: Not on file  . Years of education: Not on file  . Highest education level: Not on file  Occupational History  . Not on file  Tobacco Use  . Smoking status: Former Smoker    Packs/day: 0.25    Years: 35.00    Pack years: 8.75    Quit date: 10/12/1994    Years since quitting: 25.7  . Smokeless tobacco: Never Used  Substance and Sexual Activity  . Alcohol use: Yes    Comment: Occasional beer and gin  . Drug use: No  . Sexual activity: Never  Other Topics Concern  . Not on file  Social History Narrative  . Not on file   Social Determinants of Health   Financial Resource Strain: Not on file  Food Insecurity: Not on file  Transportation Needs: Not on file  Physical Activity: Not on file  Stress: Not on file  Social Connections: Not on file   Family History  Problem Relation Age of Onset  . Diabetes Mother   . Hypertension Mother   . Hypertension Father   . Diabetes Sister   . Hypertension Sister   . Diabetes Brother   . Hypertension Brother   . Diabetes Sister   . Thyroid disease Sister   . Sudden death Brother    - negative except otherwise stated in the  family history section No Known Allergies Prior to Admission medications   Medication Sig Start Date End Date Taking? Authorizing Provider  aspirin EC 81 MG tablet Take 81 mg by mouth at bedtime. Swallow whole.   Yes [provider]  potassium chloride SA (KLOR-CON) 20 MEQ tablet TAKE 1 TABLET(20 MEQ) BY MOUTH 1 TIME FOR 1 DOSE Patient taking differently: Take 20 mEq by mouth daily. 06/23/20  Yes Frank, Peter, MD  lisinopril-hydrochlorothiazide (ZESTORETIC) 20-25 MG tablet TAKE 1 TABLET BY MOUTH DAILY Patient not taking: Reported on 07/17/2020 06/23/20   Frank, Peter, MD  metoprolol tartrate (LOPRESSOR) 100 MG tablet TAKE 2 TABLETS(200 MG) BY MOUTH TWICE DAILY Patient not taking: Reported on 07/17/2020 02/15/20   Frank, Peter, MD  simvastatin (ZOCOR) 40 MG tablet Take 1 tablet (40 mg total) by mouth at bedtime. Patient not taking: Reported on 07/17/2020 11/20/18   Frank, Peter, MD   DG Chest Port 1 View  Result Date: 07/17/2020 CLINICAL DATA:  Sepsis EXAM: PORTABLE CHEST 1 VIEW COMPARISON:  09/24/2007 FINDINGS: 2 frontal views of the chest are obtained. Evaluation is limited due to right upper extremity contractures obscuring portions of the right hemithorax. Cardiac silhouette is unremarkable. No airspace disease, effusion, or pneumothorax. There are 2 metallic   densities projecting over the left chest, which may be on the patient's clothing. No acute bony abnormalities. IMPRESSION: 1. No acute intrathoracic process. Electronically Signed   By: Randa Ngo M.D.   On: 07/17/2020 17:56   DG Foot 2 Views Right  Result Date: 07/17/2020 CLINICAL DATA:  80 year old male with ischemic RIGHT foot with oozing and discoloration. EXAM: RIGHT FOOT - 2 VIEW COMPARISON:  None. FINDINGS: Gas within the plantar soft tissues of the foot noted highly suspicious for soft tissue infection. No radiographic evidence of acute osteomyelitis noted. Heavy vascular calcifications are present. No acute fracture or dislocation  noted. Chronic changes of the 2nd toe proximal phalanx noted. IMPRESSION: Gas within the plantar soft tissues of the foot highly suspicious for soft tissue infection. No radiographic evidence of acute osteomyelitis. Consider MRI as indicated. Electronically Signed   By: Margarette Canada M.D.   On: 07/17/2020 18:04   - pertinent xrays, CT, MRI studies were reviewed and independently interpreted  Positive ROS: All other systems have been reviewed and were otherwise negative with the exception of those mentioned in the HPI and as above.  Physical Exam: General: Alert, no acute distress Psychiatric: Patient is competent for consent with normal mood and affect Lymphatic: No axillary or cervical lymphadenopathy Cardiovascular: No pedal edema Respiratory: No cyanosis, no use of accessory musculature GI: No organomegaly, abdomen is soft and non-tender    Images:  @ENCIMAGES @  Labs:  Lab Results  Component Value Date   HGBA1C 5.6 09/29/2019   HGBA1C 6.0 06/10/2017   HGBA1C 6.4 07/20/2016   ESRSEDRATE 80 (H) 07/18/2020   CRP 14.8 (H) 07/18/2020    Lab Results  Component Value Date   ALBUMIN 2.0 (L) 07/18/2020   ALBUMIN 2.3 (L) 07/17/2020   ALBUMIN 2.6 (L) 07/14/2020     CBC EXTENDED Latest Ref Rng & Units 07/18/2020 07/17/2020 07/14/2020  WBC 4.0 - 10.5 K/uL 23.8(H) 22.8(H) 22.8(HH)  RBC 4.22 - 5.81 MIL/uL 4.37 4.73 5.30  HGB 13.0 - 17.0 g/dL 13.4 14.5 16.0  HCT 39.0 - 52.0 % 42.7 45.3 47.2  PLT 150 - 400 K/uL 150 152 284  NEUTROABS 1.7 - 7.7 K/uL 22.2(H) 21.2(H) -  LYMPHSABS 0.7 - 4.0 K/uL 0.6(L) 0.5(L) -    Neurologic: Patient does not have protective sensation bilateral lower extremities.   MUSCULOSKELETAL:   Skin: Examination patient has dry gangrene of the right forefoot.  There is swelling of the right foot.  Patient does not have a palpable pulse.  Review of the radiographs show air in the soft tissue involving the entire foot with calcification of the arteries within the  foot.  Patient has flexion contractures of both right lower extremity and right upper extremity without motor function.  Patient has a fixed flexion contracture of the right knee and right elbow.  Patient has a palpable femoral pulse but does not have a palpable anterior tibial pulse.  Patient has a white cell count that is 23.8 hemoglobin 13.4 with an albumin of 2.0.  Patient has intact renal function.  CRP 14.8.  Lactic acid 2.4.  Assessment: Assessment: Sepsis with dry gangrene of the right forefoot with infection extending to the heel with severe peripheral vascular disease and contractures of the right upper and right lower extremity.  Plan: Plan: The ideal treatment option would be to proceed with an above-the-knee amputation for the right lower extremity.  I will contact the patient's family.  I will cancel the n.p.o. order and allow patient to eat.  I will cancel the ABI.  Patient does not have revascularization options.  Please have the patient transferred to [place] and would plan for surgery on Wednesday.  Thank you for the consult and the opportunity to see Rick Schwartz  Jhovany Weidinger, MD [facility] [phone] 7:18 AM      

## 2020-07-18 NOTE — Plan of Care (Signed)

## 2020-07-18 NOTE — Plan of Care (Signed)
  Problem: Activity: Goal: Risk for activity intolerance will decrease Outcome: Not Progressing   Problem: Nutrition: Goal: Adequate nutrition will be maintained Outcome: Not Progressing   Problem: Skin Integrity: Goal: Risk for impaired skin integrity will decrease Outcome: Not Progressing   

## 2020-07-18 NOTE — Evaluation (Signed)
Clinical/Bedside Swallow Evaluation Patient Details  Name: Rick Schwartz MRN: [id number] Date of Birth: 1940-10-10  Today's Date: 07/18/2020 Time: SLP Start Time (ACUTE ONLY): 34 SLP Stop Time (ACUTE ONLY): 1245 SLP Time Calculation (min) (ACUTE ONLY): 15 min  Past Medical History:  Past Medical History:  Diagnosis Date  . Cancer [facility])    Prostate  . Cerebral palsy ([place])   . Hyperlipidemia   . Hypertension   . Peripheral arterial disease ([place])    critical limb ischemia   Past Surgical History:  Past Surgical History:  Procedure Laterality Date  . PROSTATE SURGERY     HPI:  Patient is a 80 y.o. male with PMH: HTN, PAD, cerebral palsy, non-verbal at baseline who was admitted to [facility] from home with severe sepsis due to infected right foot ulcer. CXR was unremarkable.  His sister who has been at bedside, had reported that patient has had difficulty chewing and swallowing recently, leading to decreased intake.   Assessment / Plan / Recommendation Clinical Impression  Patient presents with a mild oropharyngeal dysphagia with impact from him being edentulous and per sister who was present in the room, he does not have dentures. Patient had difficulty with labial seal when sister giving sips of liquids from cup with lid and small opening and seemed to do better with straw sips. (of note, sister reported that patient's previous caregiver had reported he was not able to suck with straw). He initially presented with some decreased laryngeal elevation however this seemed to improve after initial few sips of liquids. He consumed gelatin with mildly prolonged mastication and oral transit. No overt s/s aspiration or penetration were observed. SLP is recommending downgrading solid textures from regular to Dys 2 and to continue with thin liquids. SLP Visit Diagnosis: Dysphagia, unspecified (R13.10)    Aspiration Risk  Mild aspiration risk    Diet Recommendation Dysphagia 2 (Fine chop);Thin liquid    Liquid Administration via: Straw;Cup Medication Administration: Whole meds with puree Supervision: Full supervision/cueing for compensatory strategies;Staff to assist with self feeding Compensations: Slow rate;Small sips/bites;Follow solids with liquid Postural Changes: Seated upright at 90 degrees    Other  Recommendations Oral Care Recommendations: Oral care BID;Staff/trained caregiver to provide oral care;Other (Comment) (oral care after PO's)   Follow up Recommendations 24 hour supervision/assistance;Other (comment) (TBD)      Frequency and Duration min 1 x/week  1 week       Prognosis Prognosis for Safe Diet Advancement: Good      Swallow Study   General Date of Onset: 07/17/20 HPI: Patient is a 80 y.o. male with PMH: HTN, PAD, cerebral palsy, non-verbal at baseline who was admitted to [facility] from home with severe sepsis due to infected right foot ulcer. CXR was unremarkable.  His sister who has been at bedside, had reported that patient has had difficulty chewing and swallowing recently, leading to decreased intake. Type of Study: Bedside Swallow Evaluation Previous Swallow Assessment: none found Diet Prior to this Study: Regular;Thin liquids Temperature Spikes Noted: No Respiratory Status: Room air History of Recent Intubation: No Behavior/Cognition: Alert;Cooperative;Pleasant mood Oral Cavity Assessment: Other (comment) (SLP cleared small amount of thick secretions from hard palate) Oral Cavity - Dentition: Edentulous Self-Feeding Abilities: Total assist Patient Positioning: Partially reclined Baseline Vocal Quality: Low vocal intensity Volitional Cough: Cognitively unable to elicit Volitional Swallow: Unable to elicit    Oral/Motor/Sensory Function Overall Oral Motor/Sensory Function: Generalized oral weakness Facial ROM: Within Functional Limits Facial Symmetry: Within Functional Limits Facial Strength: Within  Functional Limits Facial Sensation: Within Functional  Limits Lingual ROM: Within Functional Limits Lingual Symmetry: Within Functional Limits Lingual Strength: Reduced Velum: Within Functional Limits Mandible: Within Functional Limits   Ice Chips     Thin Liquid Thin Liquid: Impaired Presentation: Cup;Straw Oral Phase Functional Implications: Prolonged oral transit Pharyngeal  Phase Impairments: Decreased hyoid-laryngeal movement    Nectar Thick     Honey Thick     Puree Puree: Not tested   Solid     Solid: Impaired (gelatin) Presentation: Spoon Oral Phase Impairments: Impaired mastication Oral Phase Functional Implications: Prolonged oral transit     Sonia Baller, MA, CCC-SLP Speech Therapy

## 2020-07-18 NOTE — Discharge Instructions (Signed)
Nutrition Post Hospital Stay °Proper nutrition can help your body recover from illness and injury.   °Foods and beverages high in protein, vitamins, and minerals help rebuild muscle loss, promote healing, & reduce fall risk.  ° °In addition to eating healthy foods, a nutrition shake is an easy, delicious way to get the nutrition you need during and after your hospital stay ° °It is recommended that you continue to drink 3 bottles per day of: Ensure for at least 1 month (30 days) after your hospital stay  ° °Tips for adding a nutrition shake into your routine: °As allowed, drink one with vitamins or medications instead of water or juice °Enjoy one as a tasty mid-morning or afternoon snack °Drink cold or make a milkshake out of it °Drink one instead of milk with cereal or snacks °Use as a coffee creamer °  °Available at the following grocery stores and pharmacies:           °* [employer] * [employer] * [employer]  °* [employer]          * Walmart * [employer]  °* [facility]      * Target  * [employer]J's   °* [facility]  * [employer]   °* [facility] [phone]  °          °For COUPONS visit: [email or web address] or [email or web address]  ° °Suggested Substitutions °Ensure Plus = Boost Plus = Carnation Breakfast Essentials = Boost Compact °Ensure Active Clear = Boost Breeze °Glucerna Shake = Boost Glucose Control = Carnation Breakfast Essentials SUGAR FREE ° °Suggestions For Increasing Calories And Protein °Several small meals a day are easier to eat and digest than three large ones. Space meals about 2 to 3 hours apart to maximize comfort. °Stop eating 2 to 3 hours before bed and sleep with your head elevated if gastric reflux (GERD) and heartburn are problems. °Do not eat your favorite foods if you are feeling bad. Save them for when you feel good! °Eat breakfast-type foods at any meal. Eggs are usually easy to eat and are great any time of the day. (The same goes for pancakes and waffles.) °Eat when you  feel hungry. Most people have the greatest appetite in the morning because they have not eaten all night. If this is the best meal for you, then pile on those calories and other nutrients in the morning and at lunch. Then you can have a smaller dinner without losing total calories for the day. °Eat leftovers or nutritious snacks in the afternoon and early evening to round out your day. °Try homemade or commercially prepared nutrition bars and puddings, as well as calorie- and protein-rich liquid nutritional supplements. °Benefits of Physical Activity °Talk to your doctor about physical activity. Light or moderate physical activity can help maintain muscle and promote an appetite. Walking in the neighborhood or the local mall is a great way to get up, get out, and get moving. If you are unsteady on your feet, try walking around the dining room table. °Save Room for Calorie-Rich Food! °Drink most fluids between meals instead of with meals. (It is fine to have a sip to help swallow food at meal time.) Fluids (which usually have fewer calories and nutrients than solid food) can take up valuable space in your stomach. ° °Foods Recommended °High-Protein Foods °Milk products Add cheese to toast, crackers, sandwiches, baked potatoes, vegetables, soups, noodles, meat, and fruit. Use reduced-fat (2%) or whole milk in place of water   when cooking cereal and cream soups. Include cream sauces on vegetables and pasta. Add powdered milk to cream soups and mashed potatoes.  °Eggs Have hard-cooked eggs readily available in the refrigerator. Chop and add to salads, casseroles, soups, and vegetables. Make a quick egg salad. All eggs should be well cooked to avoid the risk of harmful bacteria.  °Meats, poultry, and fish Add leftover cooked meats to soups, casseroles, salads, and omelets. Make dip by mixing diced, chopped, or shredded meat with sour cream and spices.  °Beans, legumes, nuts, and seeds Sprinkle nuts and seeds on cereals,  fruit, and desserts such as ice cream, pudding, and custard. Also serve nuts and seeds on vegetables, salads, and pasta. Spread peanut butter on toast, bread, English muffins, and fruit, or blend it in a milk shake. Add beans and peas to salads, soups, casseroles, and vegetable dishes.  °High-Calorie Foods °Butter, margarine, and  oils Melt butter or margarine over potatoes, rice, pasta, and cooked vegetables. Add melted butter or margarine into soups and casseroles and spread on bread for sandwiches before spreading sandwich spread or peanut butter. Sauté or stir-fry vegetables, meats, chicken and fish such as shrimp/scallops in olive or canola oil. A variety of oils add calories and can be used to marinate meat, chicken, or fish.  °Milk products Add whipping cream to desserts, pancakes, waffles, fruit, and hot chocolate, and fold it into soups and casseroles. Add sour cream to baked potatoes and vegetables.  °Salad dressing Use regular (not low-fat or diet) mayonnaise and salad dressing on sandwiches and in dips with vegetables and fruit.   °Sweets Add jelly and honey to bread and crackers. Add jam to fruit and ice cream and as a topping over cake.  ° °Copyright 2020 © Academy of [employer] and [employer]etics. All rights reserved.  °

## 2020-07-18 NOTE — Progress Notes (Signed)
Pharmacy Antibiotic Note  Rick Schwartz is a 80 y.o. male admitted on 07/17/2020 with osteo/cellulits.  Pharmacy has been consulted for Vancomycin dosing.  5/1 Foot Xray:  Soft tissue infection + gas; no evidence of acute osetomyelitis  Plan: Increase Rocephin to 2gm per P&T policy for osteomyelitis Vancomycin 1gm IV x1 then 500mg  IV q24h to target AUC 400-550 Check Vancomycin levels at steady state Monitor renal function and cx data    Height: 5\' 6"  (167.6 cm) Weight: 43.1 kg (95 lb) IBW/kg (Calculated) : 63.8  Temp (24hrs), Avg:97.8 F (36.6 C), Min:97.7 F (36.5 C), Max:97.8 F (36.6 C)  Recent Labs  Lab 07/14/20 0950 07/17/20 1641 07/17/20 1841  WBC 22.8* 22.8*  --   CREATININE 0.83 0.84  --   LATICACIDVEN  --  3.5* 2.4*    Estimated Creatinine Clearance: 43.5 mL/min (by C-G formula based on SCr of 0.84 mg/dL).    No Known Allergies  Antimicrobials this admission: 5/1 Cefepime x1 5/1 Vancomycin >>  5/2 Rocephin >>   Dose adjustments this admission:  Microbiology results: 5/1 BCx:   Thank you for allowing pharmacy to be a part of this patient's care.  Netta Cedars PharmD 07/18/2020 1:11 AM

## 2020-07-18 NOTE — Progress Notes (Addendum)
PROGRESS NOTE  BOYKIN BAETZ BZJ:[id number] DOB: 30-Apr-1940 DOA: 07/17/2020 PCP: Matilde Haymaker, MD  HPI/Recap of past 24 hours: HPI from Dr Vernell Leep is a 80 y.o. male with medical history significant for HTN, PAD, cerebral palsy, admitted to [facility] on 07/17/2020 with severe sepsis due to infected right foot ulcer after presenting from home. Due to hx of cerebral palsy, patient is verbal, but very difficult to understand and cant really engage in meaningful conversation.  History was gotten from patient's sister [name]), who was present at bedside, and via chart review. Patient lives at home with a 24/7 caregiver.  However, family, who had not seen the patient in over a week came to visit and noted interval development of a foul smelling right foot ulcer that appeared gangrenous and draining serous fluid.  Caregiver and family members unsure of when the foot ulcer started.  Patient was brought to the ER. In the ED, patient vital signs were notable for HR 97, otherwise stable.  Labs showed, Na 149, Ca 9.9, which is adjusted to 11.3 (albumin 2.3), WBC 23,000, initial lactate 3.5-->2.4 s/p IVF. COVID-19/influenza negative. Chest x-ray unremarkable. Plain films of the R foot demonstrated gas in the plantar soft tissue of foot highly suspicious for soft tissue infection in the absence of overt evidence of acute osteomyelitis.  EDP discussed consulted orthopedics.  Patient given IV vancomycin, cefepime, IV fluids and admitted for further management.     Today, saw patient at bedside, resting comfortably.  He was able to tell me his name and birthday.  Denied any new complaints.  Knows that he is in the hospital.    Assessment/Plan: Principal Problem:   Foot ulcer with necrosis of muscle, right (HCC) Active Problems:   HYPERTENSION, BENIGN SYSTEMIC   Necrotic toes (HCC)   Severe sepsis (HCC)   Lactic acidosis   Hypernatremia   Hypercalcemia   Severe protein-calorie  malnutrition (HCC)    Severe sepsis likely 2/2 infected right forefoot ulcer On presentation, leukocytosis, tachycardic, lactic acid 3.5 Currently afebrile, with leukocytosis LA 3.5-->2.4 CRP 14.8 UA/UC pending BC x2 pending Xray of the R foot demonstrated gas in the plantar soft tissue of foot highly suspicious for soft tissue infection Dr. Sharol Given consulted, plan for [facility] on 07/20/2020 at [facility], transfer has been arranged Continue IV vancomycin, ceftriaxone Continue IV fluids Monitor closely  Mild transaminitis LFTs mildly elevated AST 43, ALT 50, T bili 1.5 Likely 2/2 sepsis RUQ USS showed possible sludge in gallbladder, no evidence of cholecystitis, normal liver appearance Daily CMP  Hypomagnesemia Replace as needed  Hypertension BP stable Patient was prescribed lisinopril-HCTZ, Lopressor (not taking at home), may DC upon discharge especially HCTZ  History of PAD Continue aspirin when able  Reported history of dysphagia SLP consulted  History of cerebral palsy    Malnutrition Type:  Nutrition Problem: Severe Malnutrition Etiology: chronic illness,wound healing   Malnutrition Characteristics:  Signs/Symptoms: severe fat depletion,severe muscle depletion,percent weight loss Percent weight loss: 22 %   Nutrition Interventions:  Interventions: Ensure Enlive (each supplement provides 350kcal and 20 grams of protein),Prostat,MVI,Juven    Estimated body mass index is 15.33 kg/m as calculated from the following:   Height as of this encounter: 5\' 6"  (1.676 m).   Weight as of this encounter: 43.1 kg.      Code Status: DNR  Family Communication: None at bedside  Disposition Plan: Status is: Inpatient  Remains inpatient appropriate because:Inpatient level of care appropriate due to  severity of illness   Dispo: The patient is from: Home              Anticipated d/c is to: Home              Patient currently is not medically stable to d/c.   Difficult  to place patient No    Consultants:  Orthopedics Dr. Sharol Given  Procedures:  None  Antimicrobials:  Vancomycin  Ceftriaxone  DVT prophylaxis: SCDs   Objective: Vitals:   07/17/20 1732 07/17/20 1800 07/17/20 2031 07/18/20 0521  BP:  (!) 141/78 (!) 151/61 130/78  Pulse:  93 84 75  Resp:  16 16 18   Temp:   97.7 F (36.5 C) (!) 97.3 F (36.3 C)  TempSrc:   Oral Oral  SpO2:  100% 100% 99%  Weight: 43.1 kg     Height: 5\' 6"  (1.676 m)       Intake/Output Summary (Last 24 hours) at 07/18/2020 1109 Last data filed at 07/18/2020 1000 Gross per 24 hour  Intake 398.76 ml  Output 610 ml  Net -211.24 ml   Filed Weights   07/17/20 1732  Weight: 43.1 kg    Exam:  General: NAD, poor communication, alert, awake, responds to simple questions  Cardiovascular: S1, S2 present  Respiratory: CTAB  Abdomen: Soft, nontender, nondistended, bowel sounds present  Musculoskeletal: No bilateral pedal edema noted, noted BUE contractures  Skin: Noted gangrene around forefoot, including gangrenous toes, with some erythema and mild drainage  Psychiatry: Normal mood    Data Reviewed: CBC: Recent Labs  Lab 07/14/20 0950 07/17/20 1641 07/18/20 0340  WBC 22.8* 22.8* 23.8*  NEUTROABS  --  21.2* 22.2*  HGB 16.0 14.5 13.4  HCT 47.2 45.3 42.7  MCV 89 95.8 97.7  PLT 284 152 [id number]   Basic Metabolic Panel: Recent Labs  Lab 07/14/20 0950 07/17/20 1641 07/18/20 0340  NA 145* 149* 145  K 4.4 5.3* 4.4  CL 106 117* 114*  CO2 19* 23 24  GLUCOSE 133* 128* 99  BUN 23 23 19   CREATININE 0.83 0.84 0.68  CALCIUM 10.0 9.9 9.1  MG  --   --  1.6*  PHOS  --   --  2.4*   GFR: Estimated Creatinine Clearance: 45.6 mL/min (by C-G formula based on SCr of 0.68 mg/dL). Liver Function Tests: Recent Labs  Lab 07/14/20 0950 07/17/20 1641 07/18/20 0340  AST 68* 58* 43*  ALT 62* 66* 50*  ALKPHOS 127* 109 100  BILITOT 0.8 1.4* 1.5*  PROT 6.2 6.7 5.9*  ALBUMIN 2.6* 2.3* 2.0*   No results  for input(s): LIPASE, AMYLASE in the last 168 hours. No results for input(s): AMMONIA in the last 168 hours. Coagulation Profile: Recent Labs  Lab 07/17/20 1641  INR 1.1   Cardiac Enzymes: No results for input(s): CKTOTAL, CKMB, CKMBINDEX, TROPONINI in the last 168 hours. BNP (last 3 results) No results for input(s): PROBNP in the last 8760 hours. HbA1C: No results for input(s): HGBA1C in the last 72 hours. CBG: No results for input(s): GLUCAP in the last 168 hours. Lipid Profile: No results for input(s): CHOL, HDL, LDLCALC, TRIG, CHOLHDL, LDLDIRECT in the last 72 hours. Thyroid Function Tests: No results for input(s): TSH, T4TOTAL, FREET4, T3FREE, THYROIDAB in the last 72 hours. Anemia Panel: No results for input(s): VITAMINB12, FOLATE, FERRITIN, TIBC, IRON, RETICCTPCT in the last 72 hours. Urine analysis:    Component Value Date/Time   BILIRUBINUR NEGATIVE 06/22/2013 1148   PROTEINUR NEGATIVE 06/22/2013 1148  UROBILINOGEN 1.0 06/22/2013 1148   NITRITE NEGATIVE 06/22/2013 1148   LEUKOCYTESUR Negative 06/22/2013 1148   Sepsis Labs: @LABRCNTIP (procalcitonin:4,lacticidven:4)  ) Recent Results (from the past 240 hour(s))  Blood culture (routine single)     Status: None (Preliminary result)   Collection Time: 07/17/20  4:41 PM   Specimen: BLOOD  Result Value Ref Range Status   Specimen Description   Final    BLOOD LEFT ANTECUBITAL Performed at [facility] [address] [place], [place] [address]    Special Requests   Final    BOTTLES DRAWN AEROBIC AND ANAEROBIC Blood Culture results may not be optimal due to an inadequate volume of blood received in culture bottles Performed at [facility] [address], [place], [place] [address]    Culture   Final    NO GROWTH < 24 HOURS Performed at [facility] [address], [place], [place] [address]    Report Status PENDING  Incomplete  Culture, blood (single)     Status:  None (Preliminary result)   Collection Time: 07/17/20  4:41 PM   Specimen: BLOOD  Result Value Ref Range Status   Specimen Description   Final    BLOOD BLOOD LEFT HAND Performed at [facility] [address], [place], [place] [address]    Special Requests   Final    BOTTLES DRAWN AEROBIC AND ANAEROBIC Blood Culture results may not be optimal due to an inadequate volume of blood received in culture bottles Performed at [facility] [address] [place], [place] [address]    Culture   Final    NO GROWTH < 24 HOURS Performed at [facility] [address], [place], [place] [address]    Report Status PENDING  Incomplete  Resp Panel by RT-PCR (Flu A&B, Covid) Nasopharyngeal Swab     Status: None   Collection Time: 07/17/20  5:51 PM   Specimen: Nasopharyngeal Swab; Nasopharyngeal(NP) swabs in vial transport medium  Result Value Ref Range Status   SARS Coronavirus 2 by RT PCR NEGATIVE NEGATIVE Final    Comment: (NOTE) SARS-CoV-2 target nucleic acids are NOT DETECTED.  The SARS-CoV-2 RNA is generally detectable in upper respiratory specimens during the acute phase of infection. The lowest concentration of SARS-CoV-2 viral copies this assay can detect is 138 copies/mL. A negative result does not preclude SARS-Cov-2 infection and should not be used as the sole basis for treatment or other patient management decisions. A negative result may occur with  improper specimen collection/handling, submission of specimen other than nasopharyngeal swab, presence of viral mutation(s) within the areas targeted by this assay, and inadequate number of viral copies(<138 copies/mL). A negative result must be combined with clinical observations, patient history, and epidemiological information. The expected result is Negative.  Fact Sheet for Patients:  EntrepreneurPulse.com.au  Fact Sheet for Healthcare Providers:   IncredibleEmployment.be  This test is no t yet approved or cleared by the [place] FDA and  has been authorized for detection and/or diagnosis of SARS-CoV-2 by FDA under an Emergency Use Authorization (EUA). This EUA will remain  in effect (meaning this test can be used) for the duration of the COVID-19 declaration under Section 564(b)(1) of the Act, 21 U.S.C.section 360bbb-3(b)(1), unless the authorization is terminated  or revoked sooner.       Influenza A by PCR NEGATIVE NEGATIVE Final   Influenza B by PCR NEGATIVE NEGATIVE Final    Comment: (NOTE) The  Xpert Xpress SARS-CoV-2/FLU/RSV plus assay is intended as an aid in the diagnosis of influenza from Nasopharyngeal swab specimens and should not be used as a sole basis for treatment. Nasal washings and aspirates are unacceptable for Xpert Xpress SARS-CoV-2/FLU/RSV testing.  Fact Sheet for Patients: EntrepreneurPulse.com.au  Fact Sheet for Healthcare Providers: IncredibleEmployment.be  This test is not yet approved or cleared by the [place] FDA and has been authorized for detection and/or diagnosis of SARS-CoV-2 by FDA under an Emergency Use Authorization (EUA). This EUA will remain in effect (meaning this test can be used) for the duration of the COVID-19 declaration under Section 564(b)(1) of the Act, 21 U.S.C. section 360bbb-3(b)(1), unless the authorization is terminated or revoked.  Performed at [facility], [place] [address] [place], [place] [address]       Studies: DG Chest Port 1 View  Result Date: 07/17/2020 CLINICAL DATA:  Sepsis EXAM: PORTABLE CHEST 1 VIEW COMPARISON:  09/24/2007 FINDINGS: 2 frontal views of the chest are obtained. Evaluation is limited due to right upper extremity contractures obscuring portions of the right hemithorax. Cardiac silhouette is unremarkable. No airspace disease, effusion, or pneumothorax. There are  2 metallic densities projecting over the left chest, which may be on the patient's clothing. No acute bony abnormalities. IMPRESSION: 1. No acute intrathoracic process. Electronically Signed   By: Randa Ngo M.D.   On: 07/17/2020 17:56   DG Foot 2 Views Right  Result Date: 07/17/2020 CLINICAL DATA:  80 year old male with ischemic RIGHT foot with oozing and discoloration. EXAM: RIGHT FOOT - 2 VIEW COMPARISON:  None. FINDINGS: Gas within the plantar soft tissues of the foot noted highly suspicious for soft tissue infection. No radiographic evidence of acute osteomyelitis noted. Heavy vascular calcifications are present. No acute fracture or dislocation noted. Chronic changes of the 2nd toe proximal phalanx noted. IMPRESSION: Gas within the plantar soft tissues of the foot highly suspicious for soft tissue infection. No radiographic evidence of acute osteomyelitis. Consider MRI as indicated. Electronically Signed   By: Margarette Canada M.D.   On: 07/17/2020 18:04   US Abdomen Limited RUQ (LIVER/GB)  Result Date: 07/18/2020 CLINICAL DATA:  Elevated LFTs EXAM: ULTRASOUND ABDOMEN LIMITED RIGHT UPPER QUADRANT COMPARISON:  None. FINDINGS: Gallbladder: There are few hypoechoic nonshadowing foci within the gallbladder which do not demonstrate internal vascular flow measuring up to 2.5 cm, likely representing tumefactive sludge. No gallstones or wall thickening visualized. No sonographic Murphy sign noted by sonographer. Common bile duct: Diameter: 5 mm Liver: No focal lesion identified. Within normal limits in parenchymal echogenicity. Portal vein is patent on color Doppler imaging with normal direction of blood flow towards the liver. Other: None. IMPRESSION: 1. Probable tumefactive sludge within the gallbladder. No cholelithiasis or sonographic evidence of cholecystitis. 2. Unremarkable sonographic appearance of the liver. Electronically Signed   By: Dahlia Bailiff MD   On: 07/18/2020 08:56    Scheduled Meds: .  (feeding supplement) PROSource Plus  30 mL Oral TID BM  . feeding supplement  237 mL Oral TID BM  . multivitamin with minerals  1 tablet Oral Daily  . nutrition supplement (JUVEN)  1 packet Oral BID BM    Continuous Infusions: . cefTRIAXone (ROCEPHIN)  IV Stopped (07/18/20 0546)  . dextrose 5 % and 0.45% NaCl 100 mL/hr at 07/18/20 0600  . [START ON 07/19/2020] vancomycin       LOS: 1 day     Alma Friendly, MD [facility]  If 7PM-7AM, please contact night-coverage  [email or web address] 07/18/2020, 11:09 AM

## 2020-07-18 NOTE — Progress Notes (Signed)
Report given to [facility] in transit to [facility].

## 2020-07-18 NOTE — Consult Note (Addendum)
ORTHOPAEDIC CONSULTATION  REQUESTING PHYSICIAN: Alma Friendly, MD  Chief Complaint: Dry gangrene right foot.  HPI: Rick Schwartz is a 80 y.o. 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  Patient lives at home.  Patient has been undergoing recent work-up for stroke.  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  Past Medical History:  Diagnosis Date  . Cancer [facility])    Prostate  . Cerebral palsy ([place])   . Hyperlipidemia   . Hypertension   . Peripheral arterial disease ([place])    critical limb ischemia   Past Surgical History:  Procedure Laterality Date  . PROSTATE SURGERY     Social History   Socioeconomic History  . Marital status: Single    Spouse name: Not on file  . Number of children: Not on file  . Years of education: Not on file  . Highest education level: Not on file  Occupational History  . Not on file  Tobacco Use  . Smoking status: Former Smoker    Packs/day: 0.25    Years: 35.00    Pack years: 8.75    Quit date: 10/12/1994    Years since quitting: 25.7  . Smokeless tobacco: Never Used  Substance and Sexual Activity  . Alcohol use: Yes    Comment: Occasional beer and gin  . Drug use: No  . Sexual activity: Never  Other Topics Concern  . Not on file  Social History Narrative  . Not on file   Social Determinants of Health   Financial Resource Strain: Not on file  Food Insecurity: Not on file  Transportation Needs: Not on file  Physical Activity: Not on file  Stress: Not on file  Social Connections: Not on file   Family History  Problem Relation Age of Onset  . Diabetes Mother   . Hypertension Mother   . Hypertension Father   . Diabetes Sister   . Hypertension Sister   . Diabetes Brother   . Hypertension Brother   . Diabetes Sister   . Thyroid disease Sister   . Sudden death Brother    - negative except otherwise stated in the  family history section No Known Allergies Prior to Admission medications   Medication Sig Start Date End Date Taking? Authorizing Provider  aspirin EC 81 MG tablet Take 81 mg by mouth at bedtime. Swallow whole.   Yes [provider]  potassium chloride SA (KLOR-CON) 20 MEQ tablet TAKE 1 TABLET(20 MEQ) BY MOUTH 1 TIME FOR 1 DOSE Patient taking differently: Take 20 mEq by mouth daily. 06/23/20  Yes Matilde Haymaker, MD  lisinopril-hydrochlorothiazide (ZESTORETIC) 20-25 MG tablet TAKE 1 TABLET BY MOUTH DAILY Patient not taking: Reported on 07/17/2020 06/23/20   Matilde Haymaker, MD  metoprolol tartrate (LOPRESSOR) 100 MG tablet TAKE 2 TABLETS(200 MG) BY MOUTH TWICE DAILY Patient not taking: Reported on 07/17/2020 02/15/20   Matilde Haymaker, MD  simvastatin (ZOCOR) 40 MG tablet Take 1 tablet (40 mg total) by mouth at bedtime. Patient not taking: Reported on 07/17/2020 11/20/18   Matilde Haymaker, MD   DG Chest Port 1 View  Result Date: 07/17/2020 CLINICAL DATA:  Sepsis EXAM: PORTABLE CHEST 1 VIEW COMPARISON:  09/24/2007 FINDINGS: 2 frontal views of the chest are obtained. Evaluation is limited due to right upper extremity contractures obscuring portions of the right hemithorax. Cardiac silhouette is unremarkable. No airspace disease, effusion, or pneumothorax. There are 2 metallic  densities projecting over the left chest, which may be on the patient's clothing. No acute bony abnormalities. IMPRESSION: 1. No acute intrathoracic process. Electronically Signed   By: Randa Ngo M.D.   On: 07/17/2020 17:56   DG Foot 2 Views Right  Result Date: 07/17/2020 CLINICAL DATA:  80 year old male with ischemic RIGHT foot with oozing and discoloration. EXAM: RIGHT FOOT - 2 VIEW COMPARISON:  None. FINDINGS: Gas within the plantar soft tissues of the foot noted highly suspicious for soft tissue infection. No radiographic evidence of acute osteomyelitis noted. Heavy vascular calcifications are present. No acute fracture or dislocation  noted. Chronic changes of the 2nd toe proximal phalanx noted. IMPRESSION: Gas within the plantar soft tissues of the foot highly suspicious for soft tissue infection. No radiographic evidence of acute osteomyelitis. Consider MRI as indicated. Electronically Signed   By: Margarette Canada M.D.   On: 07/17/2020 18:04   - pertinent xrays, CT, MRI studies were reviewed and independently interpreted  Positive ROS: All other systems have been reviewed and were otherwise negative with the exception of those mentioned in the HPI and as above.  Physical Exam: General: Alert, no acute distress Psychiatric: Patient is competent for consent with normal mood and affect Lymphatic: No axillary or cervical lymphadenopathy Cardiovascular: No pedal edema Respiratory: No cyanosis, no use of accessory musculature GI: No organomegaly, abdomen is soft and non-tender    Images:  @ENCIMAGES @  Labs:  Lab Results  Component Value Date   HGBA1C 5.6 09/29/2019   HGBA1C 6.0 06/10/2017   HGBA1C 6.4 07/20/2016   ESRSEDRATE 80 (H) 07/18/2020   CRP 14.8 (H) 07/18/2020    Lab Results  Component Value Date   ALBUMIN 2.0 (L) 07/18/2020   ALBUMIN 2.3 (L) 07/17/2020   ALBUMIN 2.6 (L) 07/14/2020     CBC EXTENDED Latest Ref Rng & Units 07/18/2020 07/17/2020 07/14/2020  WBC 4.0 - 10.5 K/uL 23.8(H) 22.8(H) 22.8(HH)  RBC 4.22 - 5.81 MIL/uL 4.37 4.73 5.30  HGB 13.0 - 17.0 g/dL 13.4 14.5 16.0  HCT 39.0 - 52.0 % 42.7 45.3 47.2  PLT 150 - 400 K/uL 150 152 284  NEUTROABS 1.7 - 7.7 K/uL 22.2(H) 21.2(H) -  LYMPHSABS 0.7 - 4.0 K/uL 0.6(L) 0.5(L) -    Neurologic: Patient does not have protective sensation bilateral lower extremities.   MUSCULOSKELETAL:   Skin: Examination patient has dry gangrene of the right forefoot.  There is swelling of the right foot.  Patient does not have a palpable pulse.  Review of the radiographs show air in the soft tissue involving the entire foot with calcification of the arteries within the  foot.  Patient has flexion contractures of both right lower extremity and right upper extremity without motor function.  Patient has a fixed flexion contracture of the right knee and right elbow.  Patient has a palpable femoral pulse but does not have a palpable anterior tibial pulse.  Patient has a white cell count that is 23.8 hemoglobin 13.4 with an albumin of 2.0.  Patient has intact renal function.  CRP 14.8.  Lactic acid 2.4.  Assessment: Assessment: Sepsis with dry gangrene of the right forefoot with infection extending to the heel with severe peripheral vascular disease and contractures of the right upper and right lower extremity.  Plan: Plan: The ideal treatment option would be to proceed with an above-the-knee amputation for the right lower extremity.  I will contact the patient's family.  I will cancel the n.p.o. order and allow patient to eat.  I will cancel the ABI.  Patient does not have revascularization options.  Please have the patient transferred to [facility] and would plan for surgery on Wednesday.  Thank you for the consult and the opportunity to see Mr. Dawna Part, [place] [phone] 7:18 AM

## 2020-07-18 NOTE — Progress Notes (Signed)
Initial Nutrition Assessment  DOCUMENTATION CODES:  Severe malnutrition in context of chronic illness  INTERVENTION:  Recommend SLP consult.  Add Ensure Enlive po TID, each supplement provides 350 kcal and 20 grams of protein.  Add 30 ml ProSource Plus po BID, each supplement provides 100 kcal and 15 grams of protein.   Add MVI with minerals daily.  Continue Juven BID.  NUTRITION DIAGNOSIS:  Severe Malnutrition related to chronic illness,wound healing as evidenced by severe fat depletion,severe muscle depletion,percent weight loss.  GOAL:  Patient will meet greater than or equal to 90% of their needs  MONITOR:  PO intake,Supplement acceptance,Diet advancement,Labs,Weight trends,Skin  REASON FOR ASSESSMENT:  Malnutrition Screening Tool    ASSESSMENT:  80 yo male with a PMH of cerebral palsy with contractures of the RUE and RLE, HTN, hx of prostate cancer, and PAD who presents with severe sepsis d/t infected R foot ulcer. Of note, pt is non-verbal at baseline. 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  Spoke with caregiver at bedside. She reports that pt has had difficulty chewing and swallowing recently, leading to decreased intake. RD recommends SLP consult to assess texture evaluation - secured chatted MD regarding this.  Per Epic, pt has lost 22% (27 lbs) of body weight since 09/2019, which is significant and severe. Caregiver endorses a slow gradual weight loss. On exam, pt has significant depletions in fat stores, and as expected, muscle stores as well.  Recommend continuing Juven BID and adding Ensure and ProSource TID to promote wound healing with increased calories and protein. Also recommend MVI with minerals daily.  Medications: Rocephin, IVF w/ D5 @ 100 ml/hr, Mag-Sulfate, Vancomycin Labs: reviewed; Mag 2.4, Phos 1.6  NUTRITION - FOCUSED PHYSICAL EXAM: Flowsheet Row Most Recent Value  Orbital  Region Severe depletion  Upper Arm Region Severe depletion  Thoracic and Lumbar Region Severe depletion  Buccal Region Severe depletion  Temple Region Severe depletion  Clavicle Bone Region Severe depletion  Clavicle and Acromion Bone Region Severe depletion  Scapular Bone Region Severe depletion  Dorsal Hand Severe depletion  Patellar Region Severe depletion  Anterior Thigh Region Severe depletion  Posterior Calf Region Severe depletion  Edema (RD Assessment) None  Hair Reviewed  Eyes Reviewed  Mouth Reviewed  Skin Reviewed  Nails Reviewed     Diet Order:   Diet Order            Diet regular Room service appropriate? Yes; Fluid consistency: Thin  Diet effective now                EDUCATION NEEDS:  Education needs have been addressed  Skin:  Skin Assessment: Skin Integrity Issues: Skin Integrity Issues:: Unstageable,Other (Comment) Unstageable: Pressure injuries - R upper shoulder and R anterior hip Other: Wound on R foot (first 3 toes and bottom of foot black)  Last BM:  PTA/unknown  Height:  Ht Readings from Last 1 Encounters:  07/17/20 5\' 6"  (1.676 m)   Weight:  Wt Readings from Last 1 Encounters:  07/17/20 43.1 kg   BMI:  Body mass index is 15.33 kg/m.  Estimated Nutritional Needs:  Kcal:  1600-1800 Protein:  85-100 grams Fluid:  >1.6 L  Rick Schwartz, RD, LDN Registered Dietitian After Hours/Weekend Pager # in [place]

## 2020-07-18 NOTE — Progress Notes (Signed)
Date and time results received: 07/18/20 0515 (use smartphrase ".now" to insert current time)  Test: Lactic acid .  Critical Value: 2.4  Name of Provider Notified: Zierle-Ghosh  Orders Received? Or Actions Taken?: Orders Received - See Orders for details

## 2020-07-18 NOTE — Progress Notes (Signed)
Report given to Roj, RN on [place].

## 2020-07-19 LAB — CBC WITH DIFFERENTIAL/PLATELET
Abs Immature Granulocytes: 0.09 10*3/uL — ABNORMAL HIGH (ref 0.00–0.07)
Basophils Absolute: 0 10*3/uL (ref 0.0–0.1)
Basophils Relative: 0 %
Eosinophils Absolute: 0 10*3/uL (ref 0.0–0.5)
Eosinophils Relative: 0 %
HCT: 41.9 % (ref 39.0–52.0)
Hemoglobin: 13.9 g/dL (ref 13.0–17.0)
Immature Granulocytes: 1 %
Lymphocytes Relative: 3 %
Lymphs Abs: 0.5 10*3/uL — ABNORMAL LOW (ref 0.7–4.0)
MCH: 30.8 pg (ref 26.0–34.0)
MCHC: 33.2 g/dL (ref 30.0–36.0)
MCV: 92.9 fL (ref 80.0–100.0)
Monocytes Absolute: 0.6 10*3/uL (ref 0.1–1.0)
Monocytes Relative: 4 %
Neutro Abs: 14.1 10*3/uL — ABNORMAL HIGH (ref 1.7–7.7)
Neutrophils Relative %: 92 %
Platelets: 123 10*3/uL — ABNORMAL LOW (ref 150–400)
RBC: 4.51 MIL/uL (ref 4.22–5.81)
RDW: 13.4 % (ref 11.5–15.5)
WBC: 15.3 10*3/uL — ABNORMAL HIGH (ref 4.0–10.5)
nRBC: 0 % (ref 0.0–0.2)

## 2020-07-19 LAB — COMPREHENSIVE METABOLIC PANEL
ALT: 39 U/L (ref 0–44)
AST: 42 U/L — ABNORMAL HIGH (ref 15–41)
Albumin: 1.4 g/dL — ABNORMAL LOW (ref 3.5–5.0)
Alkaline Phosphatase: 86 U/L (ref 38–126)
Anion gap: 10 (ref 5–15)
BUN: 16 mg/dL (ref 8–23)
CO2: 23 mmol/L (ref 22–32)
Calcium: 8.1 mg/dL — ABNORMAL LOW (ref 8.9–10.3)
Chloride: 105 mmol/L (ref 98–111)
Creatinine, Ser: 0.62 mg/dL (ref 0.61–1.24)
GFR, Estimated: >60 mL/min (ref >60)
Glucose, Bld: 98 mg/dL (ref 70–99)
Potassium: 3.3 mmol/L — ABNORMAL LOW (ref 3.5–5.1)
Sodium: 138 mmol/L (ref 135–145)
Total Bilirubin: 0.6 mg/dL (ref 0.3–1.2)
Total Protein: 5 g/dL — ABNORMAL LOW (ref 6.5–8.1)

## 2020-07-19 LAB — LACTIC ACID, PLASMA
Lactic Acid, Venous: 4.2 mmol/L (ref 0.5–1.9)
Lactic Acid, Venous: 2.9 mmol/L (ref 0.5–1.9)

## 2020-07-19 LAB — SURGICAL PCR SCREEN
MRSA, PCR: NEGATIVE
Staphylococcus aureus: NEGATIVE

## 2020-07-19 LAB — TYPE AND SCREEN
ABO/RH(D): O POS
Antibody Screen: NEGATIVE

## 2020-07-19 LAB — CALCIUM, IONIZED: Calcium, Ionized, Serum: 5.5 mg/dL (ref 4.5–5.6)

## 2020-07-19 LAB — ABO/RH: ABO/RH(D): O POS

## 2020-07-19 LAB — MAGNESIUM: Magnesium: 1.6 mg/dL — ABNORMAL LOW (ref 1.7–2.4)

## 2020-07-19 LAB — PHOSPHORUS: Phosphorus: 2.1 mg/dL — ABNORMAL LOW (ref 2.5–4.6)

## 2020-07-19 MED ORDER — MAGNESIUM SULFATE 2 GM/50ML IV SOLN
2.0000 g | Freq: Once | INTRAVENOUS | Status: AC
  Administered 2020-07-19: 2 g via INTRAVENOUS
  Filled 2020-07-19: qty 50

## 2020-07-19 MED ORDER — SODIUM CHLORIDE 0.9 % IV BOLUS
250.0000 mL | Freq: Once | INTRAVENOUS | Status: AC
  Administered 2020-07-19: 250 mL via INTRAVENOUS

## 2020-07-19 MED ORDER — POTASSIUM CHLORIDE CRYS ER 20 MEQ PO TBCR
40.0000 meq | EXTENDED_RELEASE_TABLET | Freq: Once | ORAL | Status: AC
  Administered 2020-07-19: 40 meq via ORAL
  Filled 2020-07-19: qty 2

## 2020-07-19 MED ORDER — K PHOS MONO-SOD PHOS DI & MONO 155-852-130 MG PO TABS
500.0000 mg | ORAL_TABLET | Freq: Two times a day (BID) | ORAL | Status: AC
  Administered 2020-07-19 (×2): 500 mg via ORAL
  Filled 2020-07-19 (×2): qty 2

## 2020-07-19 NOTE — Plan of Care (Signed)
  Problem: Health Behavior/Discharge Planning: Goal: Ability to manage health-related needs will improve 07/19/2020 2046 by Yetta Glassman, RN Outcome: Progressing 07/19/2020 2046 by Yetta Glassman, RN Outcome: Progressing   Problem: Clinical Measurements: Goal: Ability to maintain clinical measurements within normal limits will improve 07/19/2020 2046 by Yetta Glassman, RN Outcome: Progressing 07/19/2020 2046 by Yetta Glassman, RN Outcome: Progressing Goal: Will remain free from infection 07/19/2020 2046 by Yetta Glassman, RN Outcome: Progressing 07/19/2020 2046 by Yetta Glassman, RN Outcome: Progressing Goal: Diagnostic test results will improve 07/19/2020 2046 by Yetta Glassman, RN Outcome: Progressing 07/19/2020 2046 by Yetta Glassman, RN Outcome: Progressing Goal: Respiratory complications will improve 07/19/2020 2046 by Yetta Glassman, RN Outcome: Progressing 07/19/2020 2046 by Yetta Glassman, RN Outcome: Progressing Goal: Cardiovascular complication will be avoided 07/19/2020 2046 by Yetta Glassman, RN Outcome: Progressing 07/19/2020 2046 by Yetta Glassman, RN Outcome: Progressing   Problem: Activity: Goal: Risk for activity intolerance will decrease 07/19/2020 2046 by Yetta Glassman, RN Outcome: Progressing 07/19/2020 2046 by Yetta Glassman, RN Outcome: Progressing   Problem: Nutrition: Goal: Adequate nutrition will be maintained 07/19/2020 2046 by Yetta Glassman, RN Outcome: Progressing 07/19/2020 2046 by Yetta Glassman, RN Outcome: Progressing   Problem: Coping: Goal: Level of anxiety will decrease 07/19/2020 2046 by Yetta Glassman, RN Outcome: Progressing 07/19/2020 2046 by Yetta Glassman, RN Outcome: Progressing   Problem: Elimination: Goal: Will not experience complications related to bowel motility 07/19/2020 2046 by Yetta Glassman, RN Outcome: Progressing 07/19/2020 2046 by Yetta Glassman, RN Outcome: Progressing Goal: Will not experience complications related to urinary  retention 07/19/2020 2046 by Yetta Glassman, RN Outcome: Progressing 07/19/2020 2046 by Yetta Glassman, RN Outcome: Progressing   Problem: Pain Managment: Goal: General experience of comfort will improve 07/19/2020 2046 by Yetta Glassman, RN Outcome: Progressing 07/19/2020 2046 by Yetta Glassman, RN Outcome: Progressing   Problem: Safety: Goal: Ability to remain free from injury will improve 07/19/2020 2046 by Yetta Glassman, RN Outcome: Progressing 07/19/2020 2046 by Yetta Glassman, RN Outcome: Progressing   Problem: Skin Integrity: Goal: Risk for impaired skin integrity will decrease 07/19/2020 2046 by Yetta Glassman, RN Outcome: Progressing 07/19/2020 2046 by Yetta Glassman, RN Outcome: Progressing

## 2020-07-19 NOTE — Progress Notes (Signed)
Beside shift report complete. Received patient awake,alert and oriented to self, place and situation. NAD noted; respirations easy on room air. Continuous pulse ox on. Contractures noted to R side. Sensation to all extremities present. Necrotic R foot noted. Condom cath in place. Patient repositioned per comfort. Whiteboard updated. All safety measures in place and belongings within reach.

## 2020-07-19 NOTE — H&P (View-Only) (Signed)
Patient ID: Rick Schwartz, male   DOB: 03/09/1941, 80 y.o.   MRN: [id number] Patient without complaints this morning.  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  Patient states he understands wishes to proceed with surgery.  I have also reviewed the surgical plan with his sister. 

## 2020-07-19 NOTE — Plan of Care (Signed)

## 2020-07-19 NOTE — Progress Notes (Signed)
  Speech Language Pathology Treatment: Dysphagia  Patient Details Name: Rick Schwartz MRN: [id number] DOB: 1941-03-17 Today's Date: 07/19/2020 Time: 9373-4287 SLP Time Calculation (min) (ACUTE ONLY): 14 min  Assessment / Plan / Recommendation Clinical Impression  Pt's sister not present during follow up session with downgraded solid texture. Pt alert and appropriately responding to questions. He may be having intermittent laryngeal penetration with his CP and now decreased conditioning. He consumed Dys 2 "like" texture and water. Wasn't until end of session when head reclined that he exhibited delayed coughing and wet vocal quality suspicious for possible esophageal involvement. Therapist elevated HOB, cued to cough which cleared voice. Pt stated he has been able to masticate Dys 2 texture. Mastication with consistency resembling Dys 2 was within functional limits. Recommend continue Dys 2, thin and discharge ST. Positioning in upright position is vital and slow rate when feeding.    HPI HPI: Patient is a 80 y.o. male with PMH: HTN, PAD, cerebral palsy, non-verbal at baseline who was admitted to [facility] from home with severe sepsis due to infected right foot ulcer. CXR was unremarkable.  His sister who has been at bedside, had reported that patient has had difficulty chewing and swallowing recently, leading to decreased intake.      SLP Plan  All goals met;Discharge SLP treatment due to (comment)       Recommendations  Diet recommendations: Dysphagia 2 (fine chop);Thin liquid Liquids provided via: Straw Medication Administration: Crushed with puree Supervision: Full supervision/cueing for compensatory strategies;Trained caregiver to feed patient Compensations: Slow rate;Small sips/bites;Lingual sweep for clearance of pocketing;Clear throat intermittently Postural Changes and/or Swallow Maneuvers: Seated upright 90 degrees                Oral Care Recommendations: Oral care BID Follow up  Recommendations: 24 hour supervision/assistance;Other (comment) SLP Visit Diagnosis: Dysphagia, unspecified (R13.10) Plan: All goals met;Discharge SLP treatment due to (comment)                       Houston Siren 07/19/2020, 3:11 PM   Orbie Pyo Colvin Caroli.Ed Risk analyst [phone] Office [phone]

## 2020-07-19 NOTE — Progress Notes (Signed)
PROGRESS NOTE  YOGI ARTHER WUX:[id number] DOB: 05-Nov-1940 DOA: 07/17/2020 PCP: Matilde Haymaker, MD  HPI/Recap of past 24 hours: HPI from Dr Vernell Leep is a 80 y.o. male with medical history significant for HTN, PAD, cerebral palsy, admitted to [facility] on 07/17/2020 with severe sepsis due to infected right foot ulcer after presenting from home. Due to hx of cerebral palsy, patient is verbal, but very difficult to understand and cant really engage in meaningful conversation.  History was gotten from patient's sister [name]), who was present at bedside, and via chart review. Patient lives at home with a 24/7 caregiver.  However, family, who had not seen the patient in over a week came to visit and noted interval development of a foul smelling right foot ulcer that appeared gangrenous and draining serous fluid.  Caregiver and family members unsure of when the foot ulcer started.  Patient was brought to the ER.  Labs showed, Na 149, Ca 9.9, which is adjusted to 11.3 (albumin 2.3), WBC 23,000, initial lactate 3.5-->2.4 s/p IVF. COVID-19/influenza negative. Chest x-ray unremarkable. Plain films of the R foot demonstrated gas in the plantar soft tissue of foot highly suspicious for soft tissue infection in the absence of overt evidence of acute osteomyelitis.  EDP discussed consulted orthopedics.  Patient given IV vancomycin, cefepime, IV fluids and admitted for further management.   5/3-pt seen and examined. Nsg at bedside. Pt denies pain, sob, cp, abd pain.    Assessment/Plan: Principal Problem:   Foot ulcer with necrosis of muscle, right (HCC) Active Problems:   HYPERTENSION, BENIGN SYSTEMIC   Necrotic toes (HCC)   Severe sepsis (HCC)   Lactic acidosis   Hypernatremia   Hypercalcemia   Severe protein-calorie malnutrition (HCC)    Severe sepsis likely 2/2 infected right forefoot ulcer/gangreen On presentation, leukocytosis, tachycardic, lactic acid 3.5 Leukocytosis  improving  Lactic acid improving down to 2.9  Continue IV fluid  X-ray of the foot demonstrated gas in the plantar soft tissue of foot highly suspicion for soft tissue infection. Orthopedics was consulted and following.   Plan for above-the-knee amputation in am Continue IV vancomycin and ceftriaxone    Mild transaminitis Likely 2/2 sepsis RUQ USS showed possible sludge in gallbladder, no evidence of cholecystitis, normal liver appearance 5/3-improving slowly.  Continue with ivf, and monitor cmp  Hypomagnesemia Replace and monitor level  Hypophophatemia-will replace and monitor  Hypertension-now hypotensive Hold outpt meds for now. Note: pt not taking lopressor at home   History of PAD Continue aspirin when able to post surgery   Reported history of dysphagia SLP consulted-see note Mild aspiration risk Dysph. 2 diet  History of cerebral palsy  Malnutrition Type:  Nutrition Problem: Severe Malnutrition Etiology: chronic illness,wound healing   Malnutrition Characteristics:  Signs/Symptoms: severe fat depletion,severe muscle depletion,percent weight loss Percent weight loss: 22 %   Nutrition Interventions:  Interventions: Ensure Enlive (each supplement provides 350kcal and 20 grams of protein),Prostat,MVI,Juven    Estimated body mass index is 15.33 kg/m as calculated from the following:   Height as of this encounter: 5\' 6"  (1.676 m).   Weight as of this encounter: 43.1 kg.      Code Status: DNR  Family Communication: None at bedside  Disposition Plan: Status is: Inpatient  Remains inpatient appropriate because:Inpatient level of care appropriate due to severity of illness   Dispo: The patient is from: Home              Anticipated d/c is  to: Home              Patient currently is not medically stable to d/c.   Difficult to place patient No   Time spent 45 min with >50% on coc   Consultants:  Orthopedics Dr.  Sharol Given  Procedures:  None  Antimicrobials:  Vancomycin  Ceftriaxone    Objective: Vitals:   07/18/20 0521 07/18/20 1330 07/18/20 2002 07/19/20 0815  BP: 130/78 94/64 (!) 99/52 137/82  Pulse: 75 90 99 86  Resp: 18 16 17 17   Temp: (!) 97.3 F (36.3 C) (!) 97.4 F (36.3 C) 98.1 F (36.7 C) 97.6 F (36.4 C)  TempSrc: Oral Oral Oral Oral  SpO2: 99% 94% 100% 98%  Weight:      Height:        Intake/Output Summary (Last 24 hours) at 07/19/2020 1510 Last data filed at 07/19/2020 1508 Gross per 24 hour  Intake 3201.56 ml  Output 560 ml  Net 2641.56 ml   Filed Weights   07/17/20 1732  Weight: 43.1 kg    Exam: Calm, nad cta no w/r Regular s1/s2 no gallop Soft benign +bs Rt foot gangrene, weeping some. Foul smelling No edema Awake and alert Mood and affect appropriate for current setting    Data Reviewed:  CBC: Recent Labs  Lab 07/14/20 0950 07/17/20 1641 07/18/20 0340 07/19/20 0424  WBC 22.8* 22.8* 23.8* 15.3*  NEUTROABS  --  21.2* 22.2* 14.1*  HGB 16.0 14.5 13.4 13.9  HCT 47.2 45.3 42.7 41.9  MCV 89 95.8 97.7 92.9  PLT 284 152 150 696*   Basic Metabolic Panel: Recent Labs  Lab 07/14/20 0950 07/17/20 1641 07/18/20 0340 07/19/20 0812  NA 145* 149* 145 138  K 4.4 5.3* 4.4 3.3*  CL 106 117* 114* 105  CO2 19* 23 24 23   GLUCOSE 133* 128* 99 98  BUN 23 23 19 16   CREATININE 0.83 0.84 0.68 0.62  CALCIUM 10.0 9.9 9.1 8.1*  MG  --   --  1.6* 1.6*  PHOS  --   --  2.4* 2.1*   GFR: Estimated Creatinine Clearance: 45.6 mL/min (by C-G formula based on SCr of 0.62 mg/dL). Liver Function Tests: Recent Labs  Lab 07/14/20 0950 07/17/20 1641 07/18/20 0340 07/19/20 0812  AST 68* 58* 43* 42*  ALT 62* 66* 50* 39  ALKPHOS 127* 109 100 86  BILITOT 0.8 1.4* 1.5* 0.6  PROT 6.2 6.7 5.9* 5.0*  ALBUMIN 2.6* 2.3* 2.0* 1.4*   No results for input(s): LIPASE, AMYLASE in the last 168 hours. No results for input(s): AMMONIA in the last 168 hours. Coagulation  Profile: Recent Labs  Lab 07/17/20 1641  INR 1.1   Cardiac Enzymes: No results for input(s): CKTOTAL, CKMB, CKMBINDEX, TROPONINI in the last 168 hours. BNP (last 3 results) No results for input(s): PROBNP in the last 8760 hours. HbA1C: No results for input(s): HGBA1C in the last 72 hours. CBG: No results for input(s): GLUCAP in the last 168 hours. Lipid Profile: No results for input(s): CHOL, HDL, LDLCALC, TRIG, CHOLHDL, LDLDIRECT in the last 72 hours. Thyroid Function Tests: No results for input(s): TSH, T4TOTAL, FREET4, T3FREE, THYROIDAB in the last 72 hours. Anemia Panel: No results for input(s): VITAMINB12, FOLATE, FERRITIN, TIBC, IRON, RETICCTPCT in the last 72 hours. Urine analysis:    Component Value Date/Time   BILIRUBINUR NEGATIVE 06/22/2013 1148   PROTEINUR NEGATIVE 06/22/2013 1148   UROBILINOGEN 1.0 06/22/2013 1148   NITRITE NEGATIVE 06/22/2013 1148   LEUKOCYTESUR  Negative 06/22/2013 1148   Sepsis Labs: @LABRCNTIP (procalcitonin:4,lacticidven:4)  ) Recent Results (from the past 240 hour(s))  Blood culture (routine single)     Status: None (Preliminary result)   Collection Time: 07/17/20  4:41 PM   Specimen: BLOOD  Result Value Ref Range Status   Specimen Description   Final    BLOOD LEFT ANTECUBITAL Performed at [facility] [address] [place], [place] [address]    Special Requests   Final    BOTTLES DRAWN AEROBIC AND ANAEROBIC Blood Culture results may not be optimal due to an inadequate volume of blood received in culture bottles Performed at [facility] [address] [place], [place] [address]    Culture   Final    NO GROWTH 2 DAYS Performed at [facility] [address], [place], [place] [address]    Report Status PENDING  Incomplete  Culture, blood (single)     Status: None (Preliminary result)   Collection Time: 07/17/20  4:41 PM   Specimen: BLOOD  Result Value Ref Range Status   Specimen  Description   Final    BLOOD BLOOD LEFT HAND Performed at [facility] [address], [place], [place] [address]    Special Requests   Final    BOTTLES DRAWN AEROBIC AND ANAEROBIC Blood Culture results may not be optimal due to an inadequate volume of blood received in culture bottles Performed at [facility] [address] [place], [place] [address]    Culture   Final    NO GROWTH 2 DAYS Performed at [facility] [address], [place], [place] [address]    Report Status PENDING  Incomplete  Resp Panel by RT-PCR (Flu A&B, Covid) Nasopharyngeal Swab     Status: None   Collection Time: 07/17/20  5:51 PM   Specimen: Nasopharyngeal Swab; Nasopharyngeal(NP) swabs in vial transport medium  Result Value Ref Range Status   SARS Coronavirus 2 by RT PCR NEGATIVE NEGATIVE Final    Comment: (NOTE) SARS-CoV-2 target nucleic acids are NOT DETECTED.  The SARS-CoV-2 RNA is generally detectable in upper respiratory specimens during the acute phase of infection. The lowest concentration of SARS-CoV-2 viral copies this assay can detect is 138 copies/mL. A negative result does not preclude SARS-Cov-2 infection and should not be used as the sole basis for treatment or other patient management decisions. A negative result may occur with  improper specimen collection/handling, submission of specimen other than nasopharyngeal swab, presence of viral mutation(s) within the areas targeted by this assay, and inadequate number of viral copies(<138 copies/mL). A negative result must be combined with clinical observations, patient history, and epidemiological information. The expected result is Negative.  Fact Sheet for Patients:  EntrepreneurPulse.com.au  Fact Sheet for Healthcare Providers:  IncredibleEmployment.be  This test is no t yet approved or cleared by the [place] FDA and  has been authorized for  detection and/or diagnosis of SARS-CoV-2 by FDA under an Emergency Use Authorization (EUA). This EUA will remain  in effect (meaning this test can be used) for the duration of the COVID-19 declaration under Section 564(b)(1) of the Act, 21 U.S.C.section 360bbb-3(b)(1), unless the authorization is terminated  or revoked sooner.       Influenza A by PCR NEGATIVE NEGATIVE Final   Influenza B by PCR NEGATIVE NEGATIVE Final    Comment: (NOTE) The Xpert Xpress SARS-CoV-2/FLU/RSV plus assay is intended as an aid in the diagnosis of influenza  from Nasopharyngeal swab specimens and should not be used as a sole basis for treatment. Nasal washings and aspirates are unacceptable for Xpert Xpress SARS-CoV-2/FLU/RSV testing.  Fact Sheet for Patients: EntrepreneurPulse.com.au  Fact Sheet for Healthcare Providers: IncredibleEmployment.be  This test is not yet approved or cleared by the [place] FDA and has been authorized for detection and/or diagnosis of SARS-CoV-2 by FDA under an Emergency Use Authorization (EUA). This EUA will remain in effect (meaning this test can be used) for the duration of the COVID-19 declaration under Section 564(b)(1) of the Act, 21 U.S.C. section 360bbb-3(b)(1), unless the authorization is terminated or revoked.  Performed at [facility], [place] [address] [place], [place] [address]       Studies: No results found.  Scheduled Meds: . (feeding supplement) PROSource Plus  30 mL Oral TID BM  . feeding supplement  237 mL Oral TID BM  . multivitamin with minerals  1 tablet Oral Daily  . nutrition supplement (JUVEN)  1 packet Oral BID BM  . phosphorus  500 mg Oral BID    Continuous Infusions: . cefTRIAXone (ROCEPHIN)  IV Stopped (07/19/20 [id number])  . dextrose 5 % and 0.45% NaCl 100 mL/hr at 07/19/20 1508  . vancomycin Stopped (07/18/20 1945)     LOS: 2 days     Nolberto Hanlon, MD [facility]  If 7PM-7AM, please contact night-coverage [email or web address] 07/19/2020, 3:10 PM

## 2020-07-19 NOTE — Progress Notes (Signed)
Patient ID: Rick Schwartz, male   DOB: May 31, 1940, 80 y.o.   MRN: [id number] Patient without complaints this morning.  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  Patient states he understands wishes to proceed with surgery.  I have also reviewed the surgical plan with his sister.

## 2020-07-19 NOTE — Plan of Care (Signed)

## 2020-07-20 ENCOUNTER — Other Ambulatory Visit: Payer: Self-pay | Admitting: Physician Assistant

## 2020-07-20 LAB — COMPREHENSIVE METABOLIC PANEL
ALT: 52 U/L — ABNORMAL HIGH (ref 0–44)
AST: 78 U/L — ABNORMAL HIGH (ref 15–41)
Albumin: 1.3 g/dL — ABNORMAL LOW (ref 3.5–5.0)
Alkaline Phosphatase: 91 U/L (ref 38–126)
Anion gap: 6 (ref 5–15)
BUN: 18 mg/dL (ref 8–23)
CO2: 23 mmol/L (ref 22–32)
Calcium: 7.8 mg/dL — ABNORMAL LOW (ref 8.9–10.3)
Chloride: 109 mmol/L (ref 98–111)
Creatinine, Ser: 0.63 mg/dL (ref 0.61–1.24)
GFR, Estimated: >60 mL/min (ref >60)
Glucose, Bld: 125 mg/dL — ABNORMAL HIGH (ref 70–99)
Potassium: 3.9 mmol/L (ref 3.5–5.1)
Sodium: 138 mmol/L (ref 135–145)
Total Bilirubin: 0.5 mg/dL (ref 0.3–1.2)
Total Protein: 4.9 g/dL — ABNORMAL LOW (ref 6.5–8.1)

## 2020-07-20 LAB — CBC WITH DIFFERENTIAL/PLATELET
Abs Immature Granulocytes: 0.08 10*3/uL — ABNORMAL HIGH (ref 0.00–0.07)
Basophils Absolute: 0 10*3/uL (ref 0.0–0.1)
Basophils Relative: 0 %
Eosinophils Absolute: 0 10*3/uL (ref 0.0–0.5)
Eosinophils Relative: 0 %
HCT: 39.2 % (ref 39.0–52.0)
Hemoglobin: 13 g/dL (ref 13.0–17.0)
Immature Granulocytes: 1 %
Lymphocytes Relative: 3 %
Lymphs Abs: 0.4 10*3/uL — ABNORMAL LOW (ref 0.7–4.0)
MCH: 30.8 pg (ref 26.0–34.0)
MCHC: 33.2 g/dL (ref 30.0–36.0)
MCV: 92.9 fL (ref 80.0–100.0)
Monocytes Absolute: 0.5 10*3/uL (ref 0.1–1.0)
Monocytes Relative: 4 %
Neutro Abs: 12.1 10*3/uL — ABNORMAL HIGH (ref 1.7–7.7)
Neutrophils Relative %: 92 %
Platelets: 129 10*3/uL — ABNORMAL LOW (ref 150–400)
RBC: 4.22 MIL/uL (ref 4.22–5.81)
RDW: 13.3 % (ref 11.5–15.5)
WBC: 13.2 10*3/uL — ABNORMAL HIGH (ref 4.0–10.5)
nRBC: 0 % (ref 0.0–0.2)

## 2020-07-20 LAB — PHOSPHORUS: Phosphorus: 1.8 mg/dL — ABNORMAL LOW (ref 2.5–4.6)

## 2020-07-20 LAB — MAGNESIUM: Magnesium: 1.9 mg/dL (ref 1.7–2.4)

## 2020-07-20 LAB — LACTIC ACID, PLASMA: Lactic Acid, Venous: 3.7 mmol/L (ref 0.5–1.9)

## 2020-07-20 MED ORDER — POTASSIUM & SODIUM PHOSPHATES 280-160-250 MG PO PACK
1.0000 | PACK | Freq: Three times a day (TID) | ORAL | Status: AC
  Administered 2020-07-20: 1 via ORAL
  Filled 2020-07-20 (×3): qty 1

## 2020-07-20 NOTE — Progress Notes (Signed)
Pharmacy Electrolyte Replacement  Recent Labs:  Recent Labs    07/20/20 0031  K 3.9  MG 1.9  PHOS 1.8*  CREATININE 0.63    Low Critical Values (K </= 2.5, Phos </= 1, Mg </= 1) Present: None  Pharmacy consulted for electrolyte repletion. Phos 2.1>>1.8 s/p Phospha Neutral tablets x 2 doses yesterday. SCr stable at 0.63.  Plan: Phos NaK 250mg  PO q8h x 3 doses F/u repeat labs in AM - may need IV supplementation if repletion not adequate tomorrow   [name], PharmD, BCPS Please check AMION for all [facility] contact numbers Clinical Pharmacist 07/20/2020 9:48 AM

## 2020-07-20 NOTE — Hospital Course (Addendum)
Rick Schwartz is a 79 y.o. 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  Initial elevated lactate at 3.5 which improved to 2.4 after receiving IV fluids.  Initial blood cultures negative, patient was started on broad-spectrum IV antibiotics.  Right foot XR demonstrated gas in the plantar soft tissues of foot highly suspicious of soft tissue infection, no overt evidence of osteomyelitis.  On 5/6, patient underwent right AKA with orthopedic surgery with placement of wound VAC.  Patient had persistent issues with hypotension/soft BP requiring multiple fluid boluses but stabilized prior to discharge.  Lactic acid trended down to normal with IV fluids.  Wound VAC removed on 5/11 prior to discharge.  Antibiotics received: amoxicillin-clavulanate (5/10-5/11) IV piperacillin-tazobactam (5/5-5/9) IV vancomycin (5/1-5/7) IV cefepime (5/1, 5/5) IV CTX (5/1-5/3)  Acute hypoxemic respiratory failure Patient had a rapid response event on 5/5 due to aspiration CXR obtained revealed left retrocardiac density consistent with atelectasis/infiltrate.  Patient was continued on antibiotics to treat for possible aspiration pneumonia.  Patient was weaned to room air prior to discharge and leukocytosis improved with WBC 10.7 on discharge.  Patient was placed on dysphagia 2 diet per SLP evaluation, continues to be at risk for aspiration.  Hypernatremia Initial sodium 149 likely secondary from dehydration.  Patient was initially on D5 1/2 NS with improvement in sodium.  Na prior to discharge 140.  Right subclavian vein DVT Noted on DVT US.  Patient was started on lower dose apixaban (5 mg twice daily x7 days,  then 2.5 mg twice daily) on 5/9 for clinical judgment given low BMI and age.  Hypokalemia Patient received multiple doses of KCl supplementation throughout hospitalization to replete potassium.  Potassium 3.7 on day of discharge.  Discharged on KCl 10 mEq daily.  Severe protein calorie malnutrition Patient evaluated by RD and received nutritional supplements throughout hospitalization.  Estimated to meet about 60% of minimal caloric needs on 48-hour calorie count.  Goals of care Palliative care was involved to assist with goals of care.  Poor prognosis given poor oral intake and risk of aspiration.  Upon discussions with family, they opted to continue medical treatment but did not want ICU level of care if needed.  Patient is DNR.  Outpatient palliative care order placed on discharge for further discussion about possibly transitioning to hospice while at SNF if family amenable.  Other problems chronic and stable.

## 2020-07-20 NOTE — Progress Notes (Addendum)
PROGRESS NOTE    Rick Schwartz   ZYY:[id number]  DOB: 1940-12-03  PCP: Matilde Haymaker, MD    DOA: 07/17/2020 LOS: 3   Brief Narrative   HPI from Dr Vernell Leep is a 80 y.o. male with medical history significant for HTN, PAD, cerebral palsy, admitted to [facility] on 07/17/2020 with severe sepsis due to infected right foot ulcer after presenting from home. Due to hx of cerebral palsy, patient is verbal, but very difficult to understand and cant really engage in meaningful conversation.  History was gotten from patient's sister [name]), who was present at bedside, and via chart review. Patient lives at home with a 24/7 caregiver.  However, family, who had not seen the patient in over a week came to visit and noted interval development of a foul smelling right foot ulcer that appeared gangrenous and draining serous fluid.  Caregiver and family members unsure of when the foot ulcer started.  Patient was brought to the ER.  Labs showed, Na 149, Ca 9.9, which is adjusted to 11.3 (albumin 2.3), WBC 23,000, initial lactate 3.5-->2.4 s/p IVF. COVID-19/influenza negative. Chest x-ray unremarkable. Plain films of the R foot demonstrated gas in the plantar soft tissue of foot highly suspicious for soft tissue infection in the absence of overt evidence of acute osteomyelitis.  EDP discussed consulted orthopedics.  Patient given IV vancomycin, cefepime, IV fluids and admitted for further management.    Assessment & Plan   Principal Problem:   Foot ulcer with necrosis of muscle, right (HCC) Active Problems:   HYPERTENSION, BENIGN SYSTEMIC   Necrotic toes (HCC)   Severe sepsis (HCC)   Lactic acidosis   Hypernatremia   Hypercalcemia   Severe protein-calorie malnutrition (HCC)   Severe sepsis likely 2/2 Gangrene of Right Forefoot with non-healing foot ulcer - complications of PAD. Severe sepsis POA with leukocytosis, tachycardic, lactic acid 3.5 consistent with organ  dysfunction. Sepsis physiology improving.  Lactate trending down. Leukocytosis improving  X-ray of the foot demonstrated gas in the plantar soft tissue of foot highly suspicion for soft tissue infection. --Ortho following, plan for above-knee amputation today --NPO --Continue IV fluid  --Empiric IV Vanc, Rocephin  Hypernatremia - POA with Na 149.   Due to poor PO intake, dehydration.   Improving with D5-1/2NS. --Continue fluids until eating/drinking after surgery  Mild Transaminitis - Improving Likely 2/2 sepsis RUQ U/S showed possible sludge in gallbladder, no evidence of cholecystitis, normal liver appearance --Monitor LFT's  Electrolyte derangements -  Hypomagnesemia / Hypophosphatemia - Mg and phos replaced. --Pharmacy consulted  Hypotension - POA due to sepsis Hx of Hypertension - BP meds held due to hypotension. --Note: pt not taking lopressor at home  History of PAD - with complications of gangrene as above. --Resume aspirin when able to post-op  History of dysphagia - SLP consulted, see their recs. Mild aspiration risk -- Dysphagia 2 diet  History of cerebral palsy - no acute issues, monitor.  Severe protein calorie malnutrition Etiology: chronic illness,wound healing Malnutrition Characteristics -  Signs/Symptoms: severe fat depletion,severe muscle depletion,percent weight loss.  Percent weight loss: 22 % Nutrition Interventions: Ensure Enlive (each supplement provides 350kcal and 20 grams of protein), Prostat, MVI, Juven Patient BMI: Body mass index is 15.33 kg/m.   DVT prophylaxis: SCDs Start: 07/17/20 1846   Diet:  Diet Orders (From admission, onward)    Start     Ordered   07/20/20 1150  Diet NPO time specified  Diet effective now  07/20/20 1149            Code Status: DNR    Subjective 07/20/20    Pt seen before surgery, wife at bedside.  Meal tray had been delivered but wife knew he wasn't supposed to eat before surgery.  Pt  reports pain in his foot but no other complaints. No fever/chills, cp, sob, n/v/d.   Disposition Plan & Communication   Status is: Inpatient  Inpatient status remains appropriate due to severity of illness, patient undergoing amputation today and on IV antibiotics as above.  Dispo: The patient is from: Home              Anticipated d/c is to: Home              Patient currently not medically stable for discharge   Difficult to place patient: Not expected   Family Communication: Wife at bedside on rounds today   Consults, Procedures, Significant Events   Consultants:   Orthopedics  Procedures:   Above-knee amputation planned for today  Antimicrobials:  Anti-infectives (From admission, onward)   Start     Dose/Rate Route Frequency Ordered Stop   07/18/20 1800  vancomycin (VANCOREADY) IVPB 500 mg/100 mL        500 mg 100 mL/hr over 60 Minutes Intravenous Every 24 hours 07/18/20 0132     07/18/20 0500  cefTRIAXone (ROCEPHIN) 2 g in sodium chloride 0.9 % 100 mL IVPB        2 g 200 mL/hr over 30 Minutes Intravenous Every 24 hours 07/18/20 0100     07/17/20 1745  ceFEPIme (MAXIPIME) 2 g in sodium chloride 0.9 % 100 mL IVPB        2 g 200 mL/hr over 30 Minutes Intravenous  Once 07/17/20 1731 07/17/20 1800   07/17/20 1745  vancomycin (VANCOCIN) IVPB 1000 mg/200 mL premix        1,000 mg 200 mL/hr over 60 Minutes Intravenous  Once 07/17/20 1735 07/17/20 1858        Micro    Objective   Vitals:   07/19/20 1709 07/19/20 1900 07/20/20 0409 07/20/20 1449  BP:  100/68 102/63 (!) 112/49  Pulse:  96 (!) 101 87  Resp:  16 16 15   Temp:    98.6 F (37 C)  TempSrc:    Oral  SpO2: 98% 91% 97% 91%  Weight:      Height:        Intake/Output Summary (Last 24 hours) at 07/20/2020 1632 Last data filed at 07/20/2020 0900 Gross per 24 hour  Intake 479.36 ml  Output 700 ml  Net -220.64 ml   Filed Weights   07/17/20 1732  Weight: 43.1 kg    Physical Exam:  General exam:  awake, alert, no acute distress, underweight, frail HEENT: dry mucus membranes, hearing grossly normal  Respiratory system: CTAB, no wheezes, rales or rhonchi, normal respiratory effort. Cardiovascular system: RRR, no pedal edema.   Gastrointestinal system: Sunken abdomen, NT, ND Central nervous system: no gross focal neurologic deficits, normal speech Extremities: Right forefoot is black and malodorous, no edema, contracted limbs Psychiatry: normal mood, congruent affect  Labs   Data Reviewed: I have personally reviewed following labs and imaging studies  CBC: Recent Labs  Lab 07/14/20 0950 07/17/20 1641 07/18/20 0340 07/19/20 0424 07/20/20 0031  WBC 22.8* 22.8* 23.8* 15.3* 13.2*  NEUTROABS  --  21.2* 22.2* 14.1* 12.1*  HGB 16.0 14.5 13.4 13.9 13.0  HCT 47.2 45.3 42.7 41.9 39.2  MCV 89 95.8 97.7 92.9 92.9  PLT 284 152 150 123* 814*   Basic Metabolic Panel: Recent Labs  Lab 07/14/20 0950 07/17/20 1641 07/18/20 0340 07/19/20 0812 07/20/20 0031  NA 145* 149* 145 138 138  K 4.4 5.3* 4.4 3.3* 3.9  CL 106 117* 114* 105 109  CO2 19* 23 24 23 23   GLUCOSE 133* 128* 99 98 125*  BUN 23 23 19 16 18   CREATININE 0.83 0.84 0.68 0.62 0.63  CALCIUM 10.0 9.9 9.1 8.1* 7.8*  MG  --   --  1.6* 1.6* 1.9  PHOS  --   --  2.4* 2.1* 1.8*   GFR: Estimated Creatinine Clearance: 45.6 mL/min (by C-G formula based on SCr of 0.63 mg/dL). Liver Function Tests: Recent Labs  Lab 07/14/20 0950 07/17/20 1641 07/18/20 0340 07/19/20 0812 07/20/20 0031  AST 68* 58* 43* 42* 78*  ALT 62* 66* 50* 39 52*  ALKPHOS 127* 109 100 86 91  BILITOT 0.8 1.4* 1.5* 0.6 0.5  PROT 6.2 6.7 5.9* 5.0* 4.9*  ALBUMIN 2.6* 2.3* 2.0* 1.4* 1.3*   No results for input(s): LIPASE, AMYLASE in the last 168 hours. No results for input(s): AMMONIA in the last 168 hours. Coagulation Profile: Recent Labs  Lab 07/17/20 1641  INR 1.1   Cardiac Enzymes: No results for input(s): CKTOTAL, CKMB, CKMBINDEX, TROPONINI in  the last 168 hours. BNP (last 3 results) No results for input(s): PROBNP in the last 8760 hours. HbA1C: No results for input(s): HGBA1C in the last 72 hours. CBG: No results for input(s): GLUCAP in the last 168 hours. Lipid Profile: No results for input(s): CHOL, HDL, LDLCALC, TRIG, CHOLHDL, LDLDIRECT in the last 72 hours. Thyroid Function Tests: No results for input(s): TSH, T4TOTAL, FREET4, T3FREE, THYROIDAB in the last 72 hours. Anemia Panel: No results for input(s): VITAMINB12, FOLATE, FERRITIN, TIBC, IRON, RETICCTPCT in the last 72 hours. Sepsis Labs: Recent Labs  Lab 07/18/20 0340 07/18/20 0945 07/19/20 0424 07/19/20 1138  LATICACIDVEN 2.4* 2.4* 4.2* 2.9*    Recent Results (from the past 240 hour(s))  Blood culture (routine single)     Status: None (Preliminary result)   Collection Time: 07/17/20  4:41 PM   Specimen: BLOOD  Result Value Ref Range Status   Specimen Description   Final    BLOOD LEFT ANTECUBITAL Performed at [facility], [place] [address], [place], [place] [address]    Special Requests   Final    BOTTLES DRAWN AEROBIC AND ANAEROBIC Blood Culture results may not be optimal due to an inadequate volume of blood received in culture bottles Performed at [place] [address] [place], [place] [address]    Culture   Final    NO GROWTH 3 DAYS Performed at [facility], [place] [address], [place], [place] [address]    Report Status PENDING  Incomplete  Culture, blood (single)     Status: None (Preliminary result)   Collection Time: 07/17/20  4:41 PM   Specimen: BLOOD  Result Value Ref Range Status   Specimen Description   Final    BLOOD BLOOD LEFT HAND Performed at [place] [address], [place], [place] [address]    Special Requests   Final    BOTTLES DRAWN AEROBIC AND ANAEROBIC Blood Culture results may not be optimal due to an inadequate volume of blood received in culture  bottles Performed at [place] [address], [place],  [address]  Culture   Final    NO GROWTH 3 DAYS Performed at [facility], [place] [address] [place], [place] [address]    Report Status PENDING  Incomplete  Resp Panel by RT-PCR (Flu A&B, Covid) Nasopharyngeal Swab     Status: None   Collection Time: 07/17/20  5:51 PM   Specimen: Nasopharyngeal Swab; Nasopharyngeal(NP) swabs in vial transport medium  Result Value Ref Range Status   SARS Coronavirus 2 by RT PCR NEGATIVE NEGATIVE Final    Comment: (NOTE) SARS-CoV-2 target nucleic acids are NOT DETECTED.  The SARS-CoV-2 RNA is generally detectable in upper respiratory specimens during the acute phase of infection. The lowest concentration of SARS-CoV-2 viral copies this assay can detect is 138 copies/mL. A negative result does not preclude SARS-Cov-2 infection and should not be used as the sole basis for treatment or other patient management decisions. A negative result may occur with  improper specimen collection/handling, submission of specimen other than nasopharyngeal swab, presence of viral mutation(s) within the areas targeted by this assay, and inadequate number of viral copies(<138 copies/mL). A negative result must be combined with clinical observations, patient history, and epidemiological information. The expected result is Negative.  Fact Sheet for Patients:  EntrepreneurPulse.com.au  Fact Sheet for Healthcare Providers:  IncredibleEmployment.be  This test is no t yet approved or cleared by the [place] FDA and  has been authorized for detection and/or diagnosis of SARS-CoV-2 by FDA under an Emergency Use Authorization (EUA). This EUA will remain  in effect (meaning this test can be used) for the duration of the COVID-19 declaration under Section 564(b)(1) of the Act, 21 U.S.C.section 360bbb-3(b)(1), unless the authorization is  terminated  or revoked sooner.       Influenza A by PCR NEGATIVE NEGATIVE Final   Influenza B by PCR NEGATIVE NEGATIVE Final    Comment: (NOTE) The Xpert Xpress SARS-CoV-2/FLU/RSV plus assay is intended as an aid in the diagnosis of influenza from Nasopharyngeal swab specimens and should not be used as a sole basis for treatment. Nasal washings and aspirates are unacceptable for Xpert Xpress SARS-CoV-2/FLU/RSV testing.  Fact Sheet for Patients: EntrepreneurPulse.com.au  Fact Sheet for Healthcare Providers: IncredibleEmployment.be  This test is not yet approved or cleared by the [place] FDA and has been authorized for detection and/or diagnosis of SARS-CoV-2 by FDA under an Emergency Use Authorization (EUA). This EUA will remain in effect (meaning this test can be used) for the duration of the COVID-19 declaration under Section 564(b)(1) of the Act, 21 U.S.C. section 360bbb-3(b)(1), unless the authorization is terminated or revoked.  Performed at [facility], [place] [address], [place], [place] [address]   Surgical pcr screen     Status: None   Collection Time: 07/19/20  1:25 PM   Specimen: Nasal Mucosa; Nasal Swab  Result Value Ref Range Status   MRSA, PCR NEGATIVE NEGATIVE Final   Staphylococcus aureus NEGATIVE NEGATIVE Final    Comment: (NOTE) The Xpert SA Assay (FDA approved for NASAL specimens in patients 2 years of age and older), is one component of a comprehensive surveillance program. It is not intended to diagnose infection nor to guide or monitor treatment. Performed at [facility], [place] [address], [place], [place] [address]       Imaging Studies   No results found.   Medications   Scheduled Meds: . (feeding supplement) PROSource Plus  30 mL Oral TID BM  . feeding supplement  237 mL Oral TID BM  .  multivitamin with minerals  1 tablet Oral Daily  . nutrition supplement (JUVEN)  1  packet Oral BID BM  . potassium & sodium phosphates  1 packet Oral TID WC & HS   Continuous Infusions: . cefTRIAXone (ROCEPHIN)  IV 2 g (07/20/20 0429)  . dextrose 5 % and 0.45% NaCl 100 mL/hr at 07/20/20 0426  . vancomycin 100 mL/hr at 07/19/20 1844       LOS: 3 days    Time spent: 30 minutes    Ezekiel Slocumb, DO [facility]  07/20/2020, 4:32 PM      If 7PM-7AM, please contact night-coverage. How to contact the [facility] Attending or Consulting provider [place] or covering provider during after hours [place], for this patient?    1. Check the care team in [facility] and look for a) attending/consulting [facility] provider listed and b) the [facility] team listed 2. Log into [email or web address] and use [place]'s universal password to access. If you do not have the password, please contact the hospital operator. 3. Locate the [facility] provider you are looking for under [facility] and page to a number that you can be directly reached. 4. If you still have difficulty reaching the provider, please page the [facility] (Director on Call) for the Hospitalists listed on amion for assistance.

## 2020-07-20 NOTE — Interval H&P Note (Signed)
History and Physical Interval Note:  07/20/2020 6:50 AM  Rick Schwartz  has presented today for surgery, with the diagnosis of Gangrene Right Foot.  The various methods of treatment have been discussed with the patient and family. After consideration of risks, benefits and other options for treatment, the patient has consented to  Procedure(s): RIGHT ABOVE KNEE AMPUTATION (Right) as a surgical intervention.  The patient's history has been reviewed, patient examined, no change in status, stable for surgery.  I have reviewed the patient's chart and labs.  Questions were answered to the patient's satisfaction.     [name]

## 2020-07-20 NOTE — Anesthesia Preprocedure Evaluation (Addendum)
Anesthesia Evaluation  Patient identified by MRN, date of birth, ID bandGeneral Assessment Comment:Responds to verbal stimuli   Reviewed: Allergy & Precautions, NPO status , Patient's Chart, lab work & pertinent test results  Airway Mallampati: III       Dental  (+) Edentulous Upper, Edentulous Lower   Pulmonary former smoker,  On non-rebreather face mask   Pulmonary exam normal        Cardiovascular hypertension, Pt. on medications and Pt. on home beta blockers + Peripheral Vascular Disease  Normal cardiovascular exam     Neuro/Psych Cerebral palsy  Neuromuscular disease    GI/Hepatic negative GI ROS, Neg liver ROS,   Endo/Other  negative endocrine ROS  Renal/GU negative Renal ROS     Musculoskeletal   Abdominal   Peds  Hematology  (+) anemia , Thrombocytopenia  HLD   Anesthesia Other Findings Gangrene Right Foot  Reproductive/Obstetrics                           Anesthesia Physical Anesthesia Plan  ASA: IV  Anesthesia Plan: Regional   Post-op Pain Management:    Induction: Intravenous  PONV Risk Score and Plan: 1 and Ondansetron, Dexamethasone, Propofol infusion and Treatment may vary due to age or medical condition  Airway Management Planned: Simple Face Mask  Additional Equipment:   Intra-op Plan:   Post-operative Plan:   Informed Consent: I have reviewed the patients History and Physical, chart, labs and discussed the procedure including the risks, benefits and alternatives for the proposed anesthesia with the patient or authorized representative who has indicated his/her understanding and acceptance.   Patient has DNR.  Discussed DNR with power of attorney and Suspend DNR.   Consent reviewed with POA  Plan Discussed with: CRNA and Surgeon  Anesthesia Plan Comments:     Anesthesia Quick Evaluation

## 2020-07-21 ENCOUNTER — Inpatient Hospital Stay (HOSPITAL_COMMUNITY): Payer: Medicare Other

## 2020-07-21 DIAGNOSIS — J9601 Acute respiratory failure with hypoxia: Secondary | ICD-10-CM | POA: Diagnosis not present

## 2020-07-21 DIAGNOSIS — A419 Sepsis, unspecified organism: Secondary | ICD-10-CM | POA: Diagnosis not present

## 2020-07-21 DIAGNOSIS — I96 Gangrene, not elsewhere classified: Secondary | ICD-10-CM | POA: Diagnosis not present

## 2020-07-21 DIAGNOSIS — I10 Essential (primary) hypertension: Secondary | ICD-10-CM

## 2020-07-21 DIAGNOSIS — M7989 Other specified soft tissue disorders: Secondary | ICD-10-CM

## 2020-07-21 LAB — COMPREHENSIVE METABOLIC PANEL
ALT: 82 U/L — ABNORMAL HIGH (ref 0–44)
AST: 109 U/L — ABNORMAL HIGH (ref 15–41)
Albumin: 1.4 g/dL — ABNORMAL LOW (ref 3.5–5.0)
Alkaline Phosphatase: 114 U/L (ref 38–126)
Anion gap: 11 (ref 5–15)
BUN: 14 mg/dL (ref 8–23)
CO2: 23 mmol/L (ref 22–32)
Calcium: 8.2 mg/dL — ABNORMAL LOW (ref 8.9–10.3)
Chloride: 103 mmol/L (ref 98–111)
Creatinine, Ser: 0.6 mg/dL — ABNORMAL LOW (ref 0.61–1.24)
GFR, Estimated: >60 mL/min (ref >60)
Glucose, Bld: 148 mg/dL — ABNORMAL HIGH (ref 70–99)
Potassium: 3.6 mmol/L (ref 3.5–5.1)
Sodium: 137 mmol/L (ref 135–145)
Total Bilirubin: 1.3 mg/dL — ABNORMAL HIGH (ref 0.3–1.2)
Total Protein: 5.5 g/dL — ABNORMAL LOW (ref 6.5–8.1)

## 2020-07-21 LAB — CBC WITH DIFFERENTIAL/PLATELET
Abs Immature Granulocytes: 0.16 10*3/uL — ABNORMAL HIGH (ref 0.00–0.07)
Basophils Absolute: 0 10*3/uL (ref 0.0–0.1)
Basophils Relative: 0 %
Eosinophils Absolute: 0 10*3/uL (ref 0.0–0.5)
Eosinophils Relative: 0 %
HCT: 38.8 % — ABNORMAL LOW (ref 39.0–52.0)
Hemoglobin: 13.1 g/dL (ref 13.0–17.0)
Immature Granulocytes: 1 %
Lymphocytes Relative: 2 %
Lymphs Abs: 0.4 10*3/uL — ABNORMAL LOW (ref 0.7–4.0)
MCH: 30.5 pg (ref 26.0–34.0)
MCHC: 33.8 g/dL (ref 30.0–36.0)
MCV: 90.2 fL (ref 80.0–100.0)
Monocytes Absolute: 0.5 10*3/uL (ref 0.1–1.0)
Monocytes Relative: 3 %
Neutro Abs: 18 10*3/uL — ABNORMAL HIGH (ref 1.7–7.7)
Neutrophils Relative %: 94 %
Platelets: DECREASED 10*3/uL (ref 150–400)
RBC: 4.3 MIL/uL (ref 4.22–5.81)
RDW: 13.2 % (ref 11.5–15.5)
WBC: 19.2 10*3/uL — ABNORMAL HIGH (ref 4.0–10.5)
nRBC: 0 % (ref 0.0–0.2)

## 2020-07-21 LAB — LACTIC ACID, PLASMA
Lactic Acid, Venous: 4.6 mmol/L (ref 0.5–1.9)
Lactic Acid, Venous: 1.7 mmol/L (ref 0.5–1.9)
Lactic Acid, Venous: 3.4 mmol/L (ref 0.5–1.9)

## 2020-07-21 LAB — BASIC METABOLIC PANEL
Anion gap: 8 (ref 5–15)
BUN: 24 mg/dL — ABNORMAL HIGH (ref 8–23)
CO2: 24 mmol/L (ref 22–32)
Calcium: 8.3 mg/dL — ABNORMAL LOW (ref 8.9–10.3)
Chloride: 105 mmol/L (ref 98–111)
Creatinine, Ser: 0.69 mg/dL (ref 0.61–1.24)
GFR, Estimated: >60 mL/min (ref >60)
Glucose, Bld: 145 mg/dL — ABNORMAL HIGH (ref 70–99)
Potassium: 3.6 mmol/L (ref 3.5–5.1)
Sodium: 137 mmol/L (ref 135–145)

## 2020-07-21 LAB — PHOSPHORUS: Phosphorus: 2 mg/dL — ABNORMAL LOW (ref 2.5–4.6)

## 2020-07-21 LAB — URIC ACID: Uric Acid, Serum: 3.4 mg/dL — ABNORMAL LOW (ref 3.7–8.6)

## 2020-07-21 LAB — CK: Total CK: 70 U/L (ref 49–397)

## 2020-07-21 LAB — MAGNESIUM: Magnesium: 1.7 mg/dL (ref 1.7–2.4)

## 2020-07-21 MED ORDER — POTASSIUM & SODIUM PHOSPHATES 280-160-250 MG PO PACK
2.0000 | PACK | Freq: Three times a day (TID) | ORAL | Status: DC
  Filled 2020-07-21 (×3): qty 2

## 2020-07-21 MED ORDER — METHOCARBAMOL 1000 MG/10ML IJ SOLN
500.0000 mg | Freq: Three times a day (TID) | INTRAVENOUS | Status: DC | PRN
  Filled 2020-07-21 (×2): qty 5

## 2020-07-21 MED ORDER — BACLOFEN 5 MG HALF TABLET: 5.0000 mg | ORAL_TABLET | Freq: Three times a day (TID) | ORAL | Status: DC

## 2020-07-21 MED ORDER — SODIUM CHLORIDE 0.9 % IV SOLN
INTRAVENOUS | Status: DC
  Administered 2020-07-23: 1000 mL via INTRAVENOUS

## 2020-07-21 MED ORDER — SODIUM CHLORIDE 0.9 % IV SOLN
2.0000 g | Freq: Two times a day (BID) | INTRAVENOUS | Status: DC
  Administered 2020-07-21: 2 g via INTRAVENOUS
  Filled 2020-07-21: qty 2

## 2020-07-21 MED ORDER — LACTATED RINGERS IV BOLUS
500.0000 mL | Freq: Once | INTRAVENOUS | Status: AC
  Administered 2020-07-21: 500 mL via INTRAVENOUS

## 2020-07-21 MED ORDER — LACTATED RINGERS IV SOLN: INTRAVENOUS | Status: DC

## 2020-07-21 MED ORDER — PANTOPRAZOLE SODIUM 40 MG IV SOLR
40.0000 mg | Freq: Two times a day (BID) | INTRAVENOUS | Status: DC
  Administered 2020-07-21 – 2020-07-24 (×8): 40 mg via INTRAVENOUS
  Filled 2020-07-21 (×9): qty 40

## 2020-07-21 MED ORDER — MAGNESIUM SULFATE IN D5W 1-5 GM/100ML-% IV SOLN
1.0000 g | Freq: Once | INTRAVENOUS | Status: AC
  Administered 2020-07-21: 1 g via INTRAVENOUS
  Filled 2020-07-21: qty 100

## 2020-07-21 MED ORDER — PIPERACILLIN-TAZOBACTAM 3.375 G IVPB
3.3750 g | Freq: Three times a day (TID) | INTRAVENOUS | Status: DC
  Administered 2020-07-21 – 2020-07-26 (×15): 3.375 g via INTRAVENOUS
  Filled 2020-07-21 (×13): qty 50

## 2020-07-21 NOTE — Progress Notes (Signed)
   07/21/20 1944  Assess: MEWS Score  BP (!) 109/33  Pulse Rate (!) 103  Resp (!) 30  SpO2 100 %  O2 Device Non-rebreather Mask  O2 Flow Rate (L/min) 15 L/min  Assess: MEWS Score  MEWS Temp 0  MEWS Systolic 0  MEWS Pulse 1  MEWS RR 2  MEWS LOC 0  MEWS Score 3  MEWS Score Color Yellow  Assess: if the MEWS score is Yellow or Red  Were vital signs taken at a resting state? Yes  Focused Assessment Change from prior assessment (see assessment flowsheet)  Early Detection of Sepsis Score *See Row Information* High  MEWS guidelines implemented *See Row Information* No, previously yellow, continue vital signs every 4 hours  Document  Patient Outcome Not stable and remains on department  Progress note created (see row info) Yes

## 2020-07-21 NOTE — Progress Notes (Signed)
Nutrition Follow-up  DOCUMENTATION CODES:   Severe malnutrition in context of chronic illness  INTERVENTION:   -RD will follow for diet advancement and add supplements as appropriate  NUTRITION DIAGNOSIS:   Severe Malnutrition related to chronic illness,wound healing as evidenced by severe fat depletion,severe muscle depletion,percent weight loss.  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 Of note, pt is non-verbal at baseline.  5/2 - diet advanced to regular, transferred from [facility] to [facility], s/p BSE- recommend downgrade to dysphagia 2 diet with thin liquids   Reviewed I/O's: +20 ml x 24 hours and +2.1 L since admission  UOP: 100 ml x 24 hours  Pt unavailable at time of visit. Attempted to speak with pt via call to hospital room phone, however, unable to reach.  Per ortho notes, lt AKA cancelled on 07/20/20. Rapid response called for tachycardia and tachypenia- pt also with large brown emesis when orally suctioned prior to transfer to PCU. Per PCCM notes, pt experienced aspiration event when vomiting. Plan to monitor for further emesis; may need to place NGT for decompression if further emesis. Plan for NPO for now.   Palliative care consulted for goals of care discussions secondary to clinical decline.   Labs reviewed.   Diet Order:   Diet Order            Diet NPO time specified  Diet effective midnight           Diet regular Room service appropriate? Yes; Fluid consistency: Thin  Diet effective now                 EDUCATION NEEDS:   Education needs have been addressed  Skin:  Skin  Assessment: Skin Integrity Issues: Skin Integrity Issues:: Unstageable,Other (Comment) Unstageable: Pressure injuries - R upper shoulder and R anterior hip Other: Wound on R foot (first 3 toes and bottom of foot black)  Last BM:  07/19/20  Height:   Ht Readings from Last 1 Encounters:  07/17/20 5\' 6"  (1.676 m)    Weight:   Wt Readings from Last 1 Encounters:  07/17/20 43.1 kg   BMI:  Body mass index is 15.33 kg/m.  Estimated Nutritional Needs:   Kcal:  1600-1800  Protein:  85-100 grams  Fluid:  >1.6 L    Loistine Chance, RD, LDN, [place] Registered Dietitian II Certified Diabetes Care and Education Specialist Please refer to [facility] for RD and/or RD on-call/weekend/after hours pager

## 2020-07-21 NOTE — Progress Notes (Signed)
Patient IV got infiltrated after lying on her left arm for a long time, although we repositioned him every 2 hours, he kept turning and leaning on the right side all the time. IV take out, assessment in the area shows swelling,  blisters  And skin tear around the iv site, foam applied on it, new IV insertion ordered, day shift nurse notified, will continue to monitor

## 2020-07-21 NOTE — Progress Notes (Signed)
Waiting for new IV to be inserted so I can hang the antibiotic scheduled, will let day shift nurse know, thanks!

## 2020-07-21 NOTE — Progress Notes (Signed)
LUE venous duplex has been completed.  Preliminary findings given to [facility], [place].  Results can be found under chart review under CV PROC. 07/21/2020 5:54 PM Mecca Guitron RVT, RDMS

## 2020-07-21 NOTE — Progress Notes (Addendum)
Floor Coverage note: S: Pt seen and evaluated at bedside.  80 yo M with h/o CP, PAD.  Pt admitted to hospital on 5/1 for gangrene of foot + severe sepsis from infected foot ulcer.  Today during day had emesis + aspiration.  Seen by PCCM (see their note).  Lactate has since come back further elevated at 4.6 at 1pm.  Pt currently on IVF at 175 /hr.  Broad spectrum ABx.  Hope is for surgery tomorrow.  Pt is DNI/DNR and pal care has been consulted.  O: Pt seen at bedside, on NRB, says breathing is okay at the moment, denies pain anywhere including in foot at the moment.  BP 96/49, RR 30, HR 103.  O2 100% but on NRB currently.  A: Concern for worsening sepsis vs tissue breakdown causing rising lactic acid levels.  Also has limited IV access currently per RN.  P: 1) Repeat lactic acid now 2) check BMP now 3) check CPK 4) check uric acid 5) NPO including meds (given aspiration event earlier today).  DCd all PO meds, switched baclofen to robaxin PRN IV.  Discussion:  If labs suggest pt continuing to worsen: then likely will give PCCM a call Re: 1) ? total IVF recs, currently +1.6L since admission Net on I+Os 2) ? Central line placement (since limited IV access at night, probably needs this tomorrow anyhow in the ideal scenario since they are planning surgery, etc.  Also: pt is actually a clinic pt of FM residency here at [facility].  Called and let residents know that he was accidentally admitted to hospitalist instead.  They say they plan to take over care at 7am tomorrow, we will continue to care for patient overnight (see resident note).  Also let flow manager know, and put this info in [facility] communication sign out.

## 2020-07-21 NOTE — Significant Event (Signed)
Rapid Response Event Note   Reason for Call :  Tachycardia, tachypnea  Initial Focused Assessment:  Pt lying in bed. Alert. Skin is warm, moist. Lung sounds are rhonchus throughout. He has a congested cough, but is unable to clear secretions. Breath sounds are congested.  Prior to transport of pt, pt was orally suctioned. Upon orally suctioning pt, he had large brown emesis. Pt denies nausea.   VS: T 100.14F rectally- s/p Tylenol administration, 114/80, HR 141, RR 44, SpO2 91% on 5LNC  Interventions:  -500cc IVF bolus, followed by maintenance fluids -PO Tylenol now -CXR, KUB -EKG -Antibiotics adjusted by provider  Plan of Care:  Transfer to PCU  -Wean oxygen as pt tolerates -Oral care per protocol -Aspiration precautions  Event Summary:  MD Notified: Dr. Arbutus Ped Call Time: 0905 Arrival Time: 0488 End Time: [place], RN

## 2020-07-21 NOTE — Progress Notes (Signed)
   07/21/20 1947  Assess: MEWS Score  BP (!) 94/49  Assess: MEWS Score  MEWS Temp 0  MEWS Systolic 1  MEWS Pulse 1  MEWS RR 2  MEWS LOC 0  MEWS Score 4  MEWS Score Color Red  Assess: if the MEWS score is Yellow or Red  Were vital signs taken at a resting state? Yes  Focused Assessment No change from prior assessment  Early Detection of Sepsis Score *See Row Information* High  MEWS guidelines implemented *See Row Information* No, previously red, continue vital signs every 4 hours  Document  Patient Outcome Other (Comment) (pt is still septic, MD aware, pt is breathing a little better,)  Progress note created (see row info) Yes

## 2020-07-21 NOTE — Progress Notes (Signed)
Patient was scheduled for above-knee amputation yesterday.  Unfortunately he was found so was unable to go through this.  Patient is lying in bed contracted is comfortable answers very simple questions.  Plan we will go forward with above-knee amputation tomorrow patient should be n.p.o. after midnight tonight

## 2020-07-21 NOTE — Progress Notes (Addendum)
PROGRESS NOTE    Rick Schwartz   QQP:[id number]  DOB: 30-Jan-1941  PCP: Rick Haymaker, MD    DOA: 07/17/2020 LOS: 4   Brief Narrative   HPI from Dr Vernell Leep is a 80 y.o. male with medical history significant for HTN, PAD, cerebral palsy, admitted to [facility] on 07/17/2020 with severe sepsis due to infected right foot ulcer after presenting from home. Due to hx of cerebral palsy, patient is verbal, but very difficult to understand and cant really engage in meaningful conversation.  History was gotten from patient's sister [name]), who was present at bedside, and via chart review. Patient lives at home with a 24/7 caregiver.  However, family, who had not seen the patient in over a week came to visit and noted interval development of a foul smelling right foot ulcer that appeared gangrenous and draining serous fluid.  Caregiver and family members unsure of when the foot ulcer started.  Patient was brought to the ER.  Labs showed, Na 149, Ca 9.9, which is adjusted to 11.3 (albumin 2.3), WBC 23,000, initial lactate 3.5-->2.4 s/p IVF. COVID-19/influenza negative. Chest x-ray unremarkable. Plain films of the R foot demonstrated gas in the plantar soft tissue of foot highly suspicious for soft tissue infection in the absence of overt evidence of acute osteomyelitis.  EDP discussed consulted orthopedics.  Patient given IV vancomycin, cefepime, IV fluids and admitted for further management.    Assessment & Plan   Principal Problem:   Foot ulcer with necrosis of muscle, right (HCC) Active Problems:   HYPERTENSION, BENIGN SYSTEMIC   Necrotic toes (HCC)   Severe sepsis (HCC)   Lactic acidosis   Hypernatremia   Hypercalcemia   Severe protein-calorie malnutrition (HCC)   Severe sepsis likely 2/2 Gangrene of Right Forefoot with non-healing foot ulcer - complications of PAD. Severe sepsis POA with leukocytosis, tachycardic, lactic acid 3.5 consistent with organ  dysfunction. X-ray of the foot demonstrated gas in the plantar soft tissue of foot highly suspicion for soft tissue infection. 5/5 - Sepsis physiology recurrent, worsening leukocytosis and lactic acidosis.  Rapid called this AM, see note. --Ortho following, plan for above-knee amputation Friday --NPO after midnight --Continue IV fluid  --Empiric IV Vanc, Rocephin change to Cefepime --LR small bolus and maintenance fluids for sepsis --Trend lactate, CBC,   Acute respiratory failure with hypoxia - rapid called AM 5/5 due to tachycardia and tachypnea.  Pt vomited large amount of very dark fluid after oropharynx was suctioned (was having trouble clearing his secretions after aspiration event).   Had O2 desat to 50s, placed on NRB mask and recovered. CXR with increasing left retrocardiac density c/w atelectasis vs infection --PCCM consulted, see their recs, appreciate assistance --Transfer to PCU --Treating for sepsis as above  ?Ileus - see report abdominal film today 5/5.  Emesis was very dark and ?fecal odor.   --low threshold to place NG tube --monitor closely  Dysphagia with aspiration - per nursing, pt having trouble with swallow today, maybe due to acute illness and weakness.  No focal neuro deficits.   --SLP evaluation  Left Upper Extremity Edema - due to IV infiltration. --Check LUE ultrasound to rule out VTE  Hypernatremia - POA with Na 149.   Due to poor PO intake, dehydration.   Resolved with D5-1/2NS, stopped.  Mild Transaminitis - Improving Likely 2/2 sepsis RUQ U/S showed possible sludge in gallbladder, no evidence of cholecystitis, normal liver appearance --Monitor LFT's  Electrolyte derangements -  Hypomagnesemia /  Hypophosphatemia - Mg and phos being replaced. --Pharmacy consulted  Hypotension - POA due to sepsis Hx of Hypertension - BP meds held due to hypotension. --Note: pt not taking Lopressor at home  History of PAD - with complications of gangrene as  above. --Resume aspirin when able to post-op  History of dysphagia - SLP consulted, see their recs. Mild aspiration risk -- Dysphagia 2 diet  History of cerebral palsy - no acute issues, monitor. 5/5 Contractures seem worsening.  Reviewed chart and PCP had planned to try on baclofen but had held off due to stroke eval was being done at that time.  --5/5 - started low dose of Baclofen.    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 - Palliative care consulted - pt is DNR status  Severe protein calorie malnutrition Etiology: chronic illness,wound healing Malnutrition Characteristics -  Signs/Symptoms: severe fat depletion,severe muscle depletion,percent weight loss.  Percent weight loss: 22 % Nutrition Interventions: Ensure Enlive (each supplement provides 350kcal and 20 grams of protein), Prostat, MVI, Juven Patient BMI: Body mass index is 15.33 kg/m.   DVT prophylaxis: SCDs Start: 07/17/20 1846   Diet:  Diet Orders (From admission, onward)    Start     Ordered   07/22/20 0001  Diet NPO time specified  Diet effective midnight        07/21/20 0809   07/21/20 0809  Diet regular Room service appropriate? Yes; Fluid consistency: Thin  Diet effective now       Question Answer Comment  Room service appropriate? Yes   Fluid consistency: Thin      07/21/20 0809            Code Status: DNR    Subjective 07/21/20    Pt seen with nursing and RR RN at bedside today.  He is tachycardic and tachypnic. Just vomited large amount of dark liquid.  Pt unable to endorse any complaints, essentially is non-verbal.  Contractures seem worse to me.   Disposition Plan & Communication   Status is: Inpatient  Inpatient status remains appropriate due to severity of illness, patient undergoing amputation today and on IV antibiotics as above.  Dispo: The patient is from: Home              Anticipated d/c  is to: Home              Patient currently not medically stable for discharge   Difficult to place patient: Not expected   Family Communication: Wife at bedside on rounds today   Consults, Procedures, Significant Events   Consultants:   Orthopedics  Procedures:   Above-knee amputation planned for today  Antimicrobials:  Anti-infectives (From admission, onward)   Start     Dose/Rate Route Frequency Ordered Stop   07/21/20 1230  piperacillin-tazobactam (ZOSYN) IVPB 3.375 g        3.375 g 12.5 mL/hr over 240 Minutes Intravenous Every 8 hours 07/21/20 1136     07/21/20 1030  ceFEPIme (MAXIPIME) 2 g in sodium chloride 0.9 % 100 mL IVPB  Status:  Discontinued        2 g 200 mL/hr over 30 Minutes Intravenous Every 12 hours 07/21/20 0934 07/21/20 1119   07/18/20 1800  vancomycin (VANCOREADY) IVPB 500 mg/100 mL        500 mg 100 mL/hr over 60 Minutes Intravenous Every 24 hours 07/18/20 0132     07/18/20 0500  cefTRIAXone (ROCEPHIN)  2 g in sodium chloride 0.9 % 100 mL IVPB  Status:  Discontinued        2 g 200 mL/hr over 30 Minutes Intravenous Every 24 hours 07/18/20 0100 07/21/20 0920   07/17/20 1745  ceFEPIme (MAXIPIME) 2 g in sodium chloride 0.9 % 100 mL IVPB        2 g 200 mL/hr over 30 Minutes Intravenous  Once 07/17/20 1731 07/17/20 1800   07/17/20 1745  vancomycin (VANCOCIN) IVPB 1000 mg/200 mL premix        1,000 mg 200 mL/hr over 60 Minutes Intravenous  Once 07/17/20 1735 07/17/20 1858        Micro    Objective   Vitals:   07/21/20 1330 07/21/20 1345 07/21/20 1357 07/21/20 1400  BP:   96/71 96/71  Pulse:      Resp: (!) 37 (!) 34  (!) 38  Temp:      TempSrc:      SpO2:   95%   Weight:      Height:        Intake/Output Summary (Last 24 hours) at 07/21/2020 1605 Last data filed at 07/21/2020 1000 Gross per 24 hour  Intake --  Output 450 ml  Net -450 ml   Filed Weights   07/17/20 1732  Weight: 43.1 kg    Physical Exam:  General exam: awake, alert,  appears in mild distress, underweight, frail Respiratory system: coarse breath sounds throughout likely referred upper airway secretion sounds, increased respiratory effort.  On NRB mask. Cardiovascular system: RRR, no pedal edema.   Central nervous system: severely contracted limbs,  Extremities: Right foot dressing clean/dry/intact, no edema    Labs   Data Reviewed: I have personally reviewed following labs and imaging studies  CBC: Recent Labs  Lab 07/17/20 1641 07/18/20 0340 07/19/20 0424 07/20/20 0031 07/21/20 0436  WBC 22.8* 23.8* 15.3* 13.2* 19.2*  NEUTROABS 21.2* 22.2* 14.1* 12.1* 18.0*  HGB 14.5 13.4 13.9 13.0 13.1  HCT 45.3 42.7 41.9 39.2 38.8*  MCV 95.8 97.7 92.9 92.9 90.2  PLT 152 150 123* 129* PLATELET CLUMPS NOTED ON SMEAR, COUNT APPEARS DECREASED   Basic Metabolic Panel: Recent Labs  Lab 07/17/20 1641 07/18/20 0340 07/19/20 0812 07/20/20 0031 07/21/20 0436  NA 149* 145 138 138 137  K 5.3* 4.4 3.3* 3.9 3.6  CL 117* 114* 105 109 103  CO2 23 24 23 23 23   GLUCOSE 128* 99 98 125* 148*  BUN 23 19 16 18 14   CREATININE 0.84 0.68 0.62 0.63 0.60*  CALCIUM 9.9 9.1 8.1* 7.8* 8.2*  MG  --  1.6* 1.6* 1.9 1.7  PHOS  --  2.4* 2.1* 1.8* 2.0*   GFR: Estimated Creatinine Clearance: 45.6 mL/min (A) (by C-G formula based on SCr of 0.6 mg/dL (L)). Liver Function Tests: Recent Labs  Lab 07/17/20 1641 07/18/20 0340 07/19/20 0812 07/20/20 0031 07/21/20 0436  AST 58* 43* 42* 78* 109*  ALT 66* 50* 39 52* 82*  ALKPHOS 109 100 86 91 114  BILITOT 1.4* 1.5* 0.6 0.5 1.3*  PROT 6.7 5.9* 5.0* 4.9* 5.5*  ALBUMIN 2.3* 2.0* 1.4* 1.3* 1.4*   No results for input(s): LIPASE, AMYLASE in the last 168 hours. No results for input(s): AMMONIA in the last 168 hours. Coagulation Profile: Recent Labs  Lab 07/17/20 1641  INR 1.1   Cardiac Enzymes: No results for input(s): CKTOTAL, CKMB, CKMBINDEX, TROPONINI in the last 168 hours. BNP (last 3 results) No results for input(s):  PROBNP in  the last 8760 hours. HbA1C: No results for input(s): HGBA1C in the last 72 hours. CBG: No results for input(s): GLUCAP in the last 168 hours. Lipid Profile: No results for input(s): CHOL, HDL, LDLCALC, TRIG, CHOLHDL, LDLDIRECT in the last 72 hours. Thyroid Function Tests: No results for input(s): TSH, T4TOTAL, FREET4, T3FREE, THYROIDAB in the last 72 hours. Anemia Panel: No results for input(s): VITAMINB12, FOLATE, FERRITIN, TIBC, IRON, RETICCTPCT in the last 72 hours. Sepsis Labs: Recent Labs  Lab 07/19/20 0424 07/19/20 1138 07/20/20 1756 07/21/20 1300  LATICACIDVEN 4.2* 2.9* 3.7* 4.6*    Recent Results (from the past 240 hour(s))  Blood culture (routine single)     Status: None (Preliminary result)   Collection Time: 07/17/20  4:41 PM   Specimen: BLOOD  Result Value Ref Range Status   Specimen Description   Final    BLOOD LEFT ANTECUBITAL Performed at [facility], [place] [address], [place], [place] [address]    Special Requests   Final    BOTTLES DRAWN AEROBIC AND ANAEROBIC Blood Culture results may not be optimal due to an inadequate volume of blood received in culture bottles Performed at [place] [address], [place], [place] [address]    Culture   Final    NO GROWTH 4 DAYS Performed at [facility], [place] [address] [place], [place] [address]    Report Status PENDING  Incomplete  Culture, blood (single)     Status: None (Preliminary result)   Collection Time: 07/17/20  4:41 PM   Specimen: BLOOD  Result Value Ref Range Status   Specimen Description   Final    BLOOD BLOOD LEFT HAND Performed at [place] [address], [place], [place] [address]    Special Requests   Final    BOTTLES DRAWN AEROBIC AND ANAEROBIC Blood Culture results may not be optimal due to an inadequate volume of blood received in culture bottles Performed at [place] [address]  [place], [place] [address]    Culture   Final    NO GROWTH 4 DAYS Performed at [facility], [place] [address], [place], [place] [address]    Report Status PENDING  Incomplete  Resp Panel by RT-PCR (Flu A&B, Covid) Nasopharyngeal Swab     Status: None   Collection Time: 07/17/20  5:51 PM   Specimen: Nasopharyngeal Swab; Nasopharyngeal(NP) swabs in vial transport medium  Result Value Ref Range Status   SARS Coronavirus 2 by RT PCR NEGATIVE NEGATIVE Final    Comment: (NOTE) SARS-CoV-2 target nucleic acids are NOT DETECTED.  The SARS-CoV-2 RNA is generally detectable in upper respiratory specimens during the acute phase of infection. The lowest concentration of SARS-CoV-2 viral copies this assay can detect is 138 copies/mL. A negative result does not preclude SARS-Cov-2 infection and should not be used as the sole basis for treatment or other patient management decisions. A negative result may occur with  improper specimen collection/handling, submission of specimen other than nasopharyngeal swab, presence of viral mutation(s) within the areas targeted by this assay, and inadequate number of viral copies(<138 copies/mL). A negative result must be combined with clinical observations, patient history, and epidemiological information. The expected result is Negative.  Fact Sheet for Patients:  EntrepreneurPulse.com.au  Fact Sheet for Healthcare Providers:  IncredibleEmployment.be  This test is no t yet approved or cleared by the [place] FDA and  has been authorized for detection and/or diagnosis of SARS-CoV-2  by FDA under an Emergency Use Authorization (EUA). This EUA will remain  in effect (meaning this test can be used) for the duration of the COVID-19 declaration under Section 564(b)(1) of the Act, 21 U.S.C.section 360bbb-3(b)(1), unless the authorization is terminated  or revoked sooner.       Influenza A by PCR NEGATIVE NEGATIVE Final    Influenza B by PCR NEGATIVE NEGATIVE Final    Comment: (NOTE) The Xpert Xpress SARS-CoV-2/FLU/RSV plus assay is intended as an aid in the diagnosis of influenza from Nasopharyngeal swab specimens and should not be used as a sole basis for treatment. Nasal washings and aspirates are unacceptable for Xpert Xpress SARS-CoV-2/FLU/RSV testing.  Fact Sheet for Patients: EntrepreneurPulse.com.au  Fact Sheet for Healthcare Providers: IncredibleEmployment.be  This test is not yet approved or cleared by the [place] FDA and has been authorized for detection and/or diagnosis of SARS-CoV-2 by FDA under an Emergency Use Authorization (EUA). This EUA will remain in effect (meaning this test can be used) for the duration of the COVID-19 declaration under Section 564(b)(1) of the Act, 21 U.S.C. section 360bbb-3(b)(1), unless the authorization is terminated or revoked.  Performed at [facility], [place] [address], [place], [place] [address]   Surgical pcr screen     Status: None   Collection Time: 07/19/20  1:25 PM   Specimen: Nasal Mucosa; Nasal Swab  Result Value Ref Range Status   MRSA, PCR NEGATIVE NEGATIVE Final   Staphylococcus aureus NEGATIVE NEGATIVE Final    Comment: (NOTE) The Xpert SA Assay (FDA approved for NASAL specimens in patients 62 years of age and older), is one component of a comprehensive surveillance program. It is not intended to diagnose infection nor to guide or monitor treatment. Performed at [facility], [place] [address] [place], [place] [address]       Imaging Studies   DG Abd 1 View  Result Date: 07/21/2020 CLINICAL DATA:  Abdominal pain EXAM: ABDOMEN - 1 VIEW COMPARISON:  None. FINDINGS: Stomach incompletely visualized. Suggestion of gastric distension from air. Multiple loops of mildly dilated small bowel without air-fluid levels. Air is seen in colon. No free air evident on supine  examination. There is aortic and iliac artery atherosclerosis. 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 IMPRESSION: Bowel dilatation without air-fluid levels. Suspect a degree of ileus or potential enteritis. Bowel obstruction felt to be less likely. No free air evident on supine examination. 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 Aortic Atherosclerosis (ICD10-I70.0). Electronically Signed   By: Lowella Grip III M.D.   On: 07/21/2020 12:15   DG CHEST PORT 1 VIEW  Result Date: 07/21/2020 CLINICAL DATA:  Cough EXAM: PORTABLE CHEST 1 VIEW COMPARISON:  07/17/2020 FINDINGS: Cardiac shadow is enlarged but accentuated by patient rotation. Aortic calcifications are noted. The lungs are well aerated bilaterally. Some increasing left basilar opacity is noted in the retrocardiac region. No bony abnormality is seen. IMPRESSION: Increasing left retrocardiac density consistent with atelectasis/infiltrate. Electronically Signed   By: Inez Catalina M.D.   On: 07/21/2020 12:15     Medications   Scheduled Meds: . (feeding supplement) PROSource Plus  30 mL Oral TID BM  . baclofen  5 mg Oral TID  . feeding supplement  237 mL Oral TID BM  . multivitamin with minerals  1 tablet Oral Daily  . nutrition supplement (JUVEN)  1 packet Oral BID BM  . pantoprazole (PROTONIX) IV  40 mg Intravenous Q12H  . potassium & sodium phosphates  2  packet Oral TID WC & HS   Continuous Infusions: . dextrose 5 % and 0.45% NaCl 100 mL/hr at 07/20/20 0426  . lactated ringers 75 mL/hr at 07/21/20 1149  . piperacillin-tazobactam (ZOSYN)  IV 3.375 g (07/21/20 1510)  . vancomycin 500 mg (07/20/20 1831)       LOS: 4 days    Time spent: 45 minutes with > 50% spent in coordination of care and at bedside.     Ezekiel Slocumb, DO [facility]  07/21/2020, 4:05 PM      If 7PM-7AM, please contact night-coverage. How to contact the [facility] Attending or Consulting provider [place] or covering provider during after hours [place], for  this patient?    1. Check the care team in [facility] and look for a) attending/consulting [facility] provider listed and b) the [facility] team listed 2. Log into [email or web address] and use [place] universal password to access. If you do not have the password, please contact the hospital operator. 3. Locate the [facility] provider you are looking for under [facility] and page to a number that you can be directly reached. 4. If you still have difficulty reaching the provider, please page the [facility] (Director on Call) for the Hospitalists listed on amion for assistance.

## 2020-07-21 NOTE — Consult Note (Signed)
NAME:  Rick Schwartz, MRN:  [id number], DOB:  11/08/1940, LOS: 4 ADMISSION DATE:  07/17/2020, CONSULTATION DATE: 07/21/2020 REFERRING MD: [facility], CHIEF COMPLAINT: Respiratory distress following suctioning of oropharynx and emesis.  History of Present Illness:  Rick Schwartz is a 80 year old DNR with a plethora of health issues that are significant for but not limited to cerebral palsy with severe contraction upper extremity and lower extremities.  Peripheral vascular disease with right foot gangrenous and is been set up for right AKA.  He has severe protein calorie malnutrition and requires a dysphagia 2 diet.  Initially admitted to [facility] 07/17/2020 transferred to [facility] for above-the-knee per orthopedic surgery 07/21/2020 had emesis during oral suctioning as a concern for aspiration.  He has a continued lactic acidosis and due to his increasing needs for nursing to be transferred to stepdown unit.  He is a DNR and are DNI and may be at the point he needs palliative care interventions.  Pertinent  Medical History   Past Medical History:  Diagnosis Date  . Cancer [facility])    Prostate  . Cerebral palsy ([place])   . Hyperlipidemia   . Hypertension   . Peripheral arterial disease ([place])    critical limb ischemia     Significant Hospital Events: Including procedures, antibiotic start and stop dates in addition to other pertinent events   . 07/21/2020 transfer to stepdown unit  Interim History / Subjective:  Critical care called due to increased work of breathing  Objective   Blood pressure 102/75, pulse (!) 102, temperature (!) 100.7 F (38.2 C), temperature source Rectal, resp. rate (!) 43, height _0  (1.676 m), weight 43.1 kg, SpO2 100 %.        Intake/Output Summary (Last 24 hours) at 07/21/2020 1133 Last data filed at 07/21/2020 0500 Gross per 24 hour  Intake --  Output 100 ml  Net -100 ml   Filed Weights   07/17/20 1732  Weight: 43.1 kg    Examination: General:  Frail cachectic contracted 80 year old male HENT: Copious ORAL secretions, noisy airway from retained secretion Lungs: Decreased breath sounds Cardiovascular: Heart sounds were Abdomen: Soft positive bowel Extremities: Severe contractures right upper extremity and lower extremity Neuro: Appears understand the spoken word but has great difficulty communicating due to garbled speech   Labs/imaging that I havepersonally reviewed  (right click and "Reselect all SmartList Selections" daily)  Chest x-ray CBC be met lactic acid microbiology stat  Resolved Hospital Problem list     Assessment & Plan:  Respiratory distress is multifactorial in the setting of severe debilitating neuromuscular disease i.e. cerebral palsy, septic state secondary to gangrenous right foot and vomiting secondary to attempted suction of copious oral secretions.  Note he is a DNR and DNI. Oxygen to keep sats greater than 92% Consider placing NG tube to decompress stomach N.p.o. for now Continue antimicrobial therapy but changed to Zosyn from cefepime Transfer to stepdown unit for better monitoring  Emesis most likely from stimulation from oral suctioning Avoid oral suction May need NG tube Monitor for further emesis  Gangrenous right foot is for AKA per orthopedic surgery Orthopedic surgery May be difficult to get off ventilator once intubated.  Cerebral palsy Monitor  Electrolyte disturbances Monitor replete as needed  Lactic acidosis with last lactic acid 3.4 on 07/20/2020 Monitor Suspect once leg is removed should improve  Multiple areas of skin breakdown Wound ostomy care  Generalized failure to thrive in the setting of multiple health issues Consider palliative  care consult       Best practice (right click and "Reselect all SmartList Selections" daily)  Diet:  NPO Pain/Anxiety/Delirium protocol (if indicated): No VAP protocol (if indicated): Yes DVT prophylaxis: Subcutaneous Heparin GI  prophylaxis: N/A Glucose control:  SSI No Central venous access:  N/A Arterial line:  N/A Foley:  N/A Mobility:  bed rest  PT consulted: N/A Last date of multidisciplinary goals of care discussion [facility] per primary] Code Status:  DNR Disposition: sdu  Labs   CBC: Recent Labs  Lab 07/17/20 1641 07/18/20 0340 07/19/20 0424 07/20/20 0031 07/21/20 0436  WBC 22.8* 23.8* 15.3* 13.2* 19.2*  NEUTROABS 21.2* 22.2* 14.1* 12.1* 18.0*  HGB 14.5 13.4 13.9 13.0 13.1  HCT 45.3 42.7 41.9 39.2 38.8*  MCV 95.8 97.7 92.9 92.9 90.2  PLT 152 150 123* 129* PLATELET CLUMPS NOTED ON SMEAR, COUNT APPEARS DECREASED    Basic Metabolic Panel: Recent Labs  Lab 07/17/20 1641 07/18/20 0340 07/19/20 0812 07/20/20 0031 07/21/20 0436  NA 149* 145 138 138 137  K 5.3* 4.4 3.3* 3.9 3.6  CL 117* 114* 105 109 103  CO2 _0 GLUCOSE 128* 99 98 125* 148*  BUN _1 CREATININE 0.84 0.68 0.62 0.63 0.60*  CALCIUM 9.9 9.1 8.1* 7.8* 8.2*  MG  --  1.6* 1.6* 1.9 1.7  PHOS  --  2.4* 2.1* 1.8* 2.0*   GFR: Estimated Creatinine Clearance: 45.6 mL/min (A) (by C-G formula based on SCr of 0.6 mg/dL (L)). Recent Labs  Lab 07/18/20 0340 07/18/20 0945 07/19/20 0424 07/19/20 1138 07/20/20 0031 07/20/20 1756 07/21/20 0436  WBC 23.8*  --  15.3*  --  13.2*  --  19.2*  LATICACIDVEN 2.4* 2.4* 4.2* 2.9*  --  3.7*  --     Liver Function Tests: Recent Labs  Lab 07/17/20 1641 07/18/20 0340 07/19/20 0812 07/20/20 0031 07/21/20 0436  AST 58* 43* 42* 78* 109*  ALT 66* 50* 39 52* 82*  ALKPHOS 109 100 86 91 114  BILITOT 1.4* 1.5* 0.6 0.5 1.3*  PROT 6.7 5.9* 5.0* 4.9* 5.5*  ALBUMIN 2.3* 2.0* 1.4* 1.3* 1.4*   No results for input(s): LIPASE, AMYLASE in the last 168 hours. No results for input(s): AMMONIA in the last 168 hours.  ABG No results found for: PHART, PCO2ART, PO2ART, HCO3, TCO2, ACIDBASEDEF, O2SAT   Coagulation Profile: Recent Labs  Lab 07/17/20 1641  INR 1.1    Cardiac  Enzymes: No results for input(s): CKTOTAL, CKMB, CKMBINDEX, TROPONINI in the last 168 hours.  HbA1C: Hemoglobin A1C  Date/Time Value Ref Range Status  06/10/2017 04:20 PM 6.0  Final  07/20/2016 10:52 AM 6.4  Final   Hgb A1c MFr Bld  Date/Time Value Ref Range Status  09/29/2019 10:51 AM 5.6 4.8 - 5.6 % Final    Comment:             Prediabetes: 5.7 - 6.4          Diabetes: >6.4          Glycemic control for adults with diabetes: <7.0     CBG: No results for input(s): GLUCAP in the last 168 hours.  Review of Systems:   na  Past Medical History:  He,  has a past medical history of Cancer ([place]), Cerebral palsy ([place]), Hyperlipidemia, Hypertension, and Peripheral arterial disease ([place]).   Surgical History:   Past Surgical History:  Procedure Laterality Date  . PROSTATE SURGERY  Social History:   reports that he quit smoking about 25 years ago. He has a 8.75 pack-year smoking history. He has never used smokeless tobacco. He reports current alcohol use. He reports that he does not use drugs.   Family History:  His family history includes Diabetes in his brother, mother, sister, and sister; Hypertension in his brother, father, mother, and sister; Sudden death in his brother; Thyroid disease in his sister.   Allergies No Known Allergies   Home Medications  Prior to Admission medications   Medication Sig Start Date End Date Taking? Authorizing Provider  aspirin EC 81 MG tablet Take 81 mg by mouth at bedtime. Swallow whole.   Yes [provider]  potassium chloride SA (KLOR-CON) 20 MEQ tablet TAKE 1 TABLET(20 MEQ) BY MOUTH 1 TIME FOR 1 DOSE Patient taking differently: Take 20 mEq by mouth daily. 06/23/20  Yes Matilde Haymaker, MD  lisinopril-hydrochlorothiazide (ZESTORETIC) 20-25 MG tablet TAKE 1 TABLET BY MOUTH DAILY Patient not taking: Reported on 07/17/2020 06/23/20   Matilde Haymaker, MD  metoprolol tartrate (LOPRESSOR) 100 MG tablet TAKE 2 TABLETS(200 MG) BY MOUTH TWICE  DAILY Patient not taking: Reported on 07/17/2020 02/15/20   Matilde Haymaker, MD  simvastatin (ZOCOR) 40 MG tablet Take 1 tablet (40 mg total) by mouth at bedtime. Patient not taking: Reported on 07/17/2020 11/20/18   Matilde Haymaker, MD     Critical care time:     Richardson Landry Jaquin Coy ACNP Acute Care Nurse Practitioner [place] Please consult Amion 07/21/2020, 11:33 AM

## 2020-07-21 NOTE — Progress Notes (Addendum)
Pharmacy Antibiotic Note  Rick Schwartz is a 80 y.o. male admitted on 07/17/2020 with wound infection. Pharmacy has been consulted for vancomycin dosing. Now asked to transition ceftriaxone to cefepime dosing. SCr stable 0.6. Planning AKA on 5/6 per Ortho notes.  5/1 Foot Xray:  Soft tissue infection + gas; no evidence of acute osteomyelitis.  Plan: D/c ceftriaxone >> start cefepime 2g IV q12h Continue vancomycin 500mg  IV q24h. Goal AUC 400-550. Expected AUC: 464 SCr used: 1 (nonambulatory) Monitor clinical progress, c/s, renal function,  vancomycin levels as indicated F/u antibiotic LOT post-op  ADDENDUM - Pharmacy consulted to transition cefepime to Zosyn per CCM.  Plan: D/c cefepime >> start Zosyn 3.375g IV q8h (4h infusion)   Height: 5\' 6"  (167.6 cm) Weight: 43.1 kg (95 lb) IBW/kg (Calculated) : 63.8  Temp (24hrs), Avg:99.1 F (37.3 C), Min:98.6 F (37 C), Max:100 F (37.8 C)  Recent Labs  Lab 07/17/20 1641 07/17/20 1841 07/18/20 0340 07/18/20 0945 07/19/20 0424 07/19/20 0812 07/19/20 1138 07/20/20 0031 07/20/20 1756 07/21/20 0436  WBC 22.8*  --  23.8*  --  15.3*  --   --  13.2*  --  19.2*  CREATININE 0.84  --  0.68  --   --  0.62  --  0.63  --  0.60*  LATICACIDVEN 3.5*   < > 2.4* 2.4* 4.2*  --  2.9*  --  3.7*  --    < > = values in this interval not displayed.    Estimated Creatinine Clearance: 45.6 mL/min (A) (by C-G formula based on SCr of 0.6 mg/dL (L)).    No Known Allergies   Rick Schwartz, PharmD, BCPS Please check AMION for all [place] contact numbers Clinical Pharmacist 07/21/2020 9:34 AM

## 2020-07-21 NOTE — Progress Notes (Signed)
SLP Cancellation Note  Patient Details Name: Rick Schwartz MRN: [id number] DOB: 23-Apr-1940   Cancelled treatment:       Reason Eval/Treat Not Completed: Medical issues which prohibited therapy; per nursing pt very sick not appropriate for BSE. Will continue to follow for completion of repeat BSE   Hayden Rasmussen MA, [place]   07/21/2020, 11:05 AM

## 2020-07-21 NOTE — Progress Notes (Signed)
Pharmacy Electrolyte Replacement  Recent Labs:  Recent Labs    07/21/20 0436  K 3.6  MG 1.7  PHOS 2.0*  CREATININE 0.60*    Low Critical Values (K </= 2.5, Phos </= 1, Mg </= 1) Present: None  MD Contacted: n/a - no critical values noted  Pharmacy consulted for electrolyte repletion. Phos increased (1.8 >> 2) s/p Phos NaK 250mg  q8h x 3 doses yesterday. SCr stable at 0.6.  Plan: Give Phos NaK 500mg  PO q8h x 3 doses F/u repeat labs in AM - may need IV supplementation if increased PO dosage not adequate repletion   [name], PharmD, BCPS Please check AMION for all [place] contact numbers Clinical Pharmacist 07/21/2020 11:11 AM

## 2020-07-21 NOTE — Progress Notes (Signed)
Received signout from Dr. Jennette Kettle as this is a [facility] clinic patient.  Briefly, patient with history of CP admitted for gangrene of the right foot.  Initially planned to have an amputation today but did not happen, possibly having amputation tomorrow.  Lactic acid levels are continuing to rise, being treated with fluids.  Patient is hypotensive, possibly will require pressor support but this will be discussed with CCM.  Patient had an aspiration event earlier today and was subsequently made NPO.  There is ongoing discussion with palliative care and patient is currently DNR.  FPTS will plan to resume care tomorrow morning at 7:00 AM.  However, it appears there is a possibility that this patient may end up going to the ICU.

## 2020-07-22 ENCOUNTER — Inpatient Hospital Stay (HOSPITAL_COMMUNITY): Payer: Medicare Other

## 2020-07-22 ENCOUNTER — Inpatient Hospital Stay (HOSPITAL_COMMUNITY): Payer: Medicare Other | Admitting: Anesthesiology

## 2020-07-22 ENCOUNTER — Encounter (HOSPITAL_COMMUNITY)
Admission: EM | Disposition: A | Discharge status: Skilled Nursing Facility | Payer: Self-pay | Source: Home / Self Care | Attending: Family Medicine

## 2020-07-22 ENCOUNTER — Encounter (HOSPITAL_COMMUNITY): Payer: Self-pay | Admitting: Internal Medicine

## 2020-07-22 DIAGNOSIS — Z66 Do not resuscitate: Secondary | ICD-10-CM

## 2020-07-22 DIAGNOSIS — E43 Unspecified severe protein-calorie malnutrition: Secondary | ICD-10-CM | POA: Diagnosis not present

## 2020-07-22 DIAGNOSIS — E872 Acidosis: Secondary | ICD-10-CM | POA: Diagnosis not present

## 2020-07-22 DIAGNOSIS — Z7189 Other specified counseling: Secondary | ICD-10-CM

## 2020-07-22 DIAGNOSIS — K567 Ileus, unspecified: Secondary | ICD-10-CM

## 2020-07-22 DIAGNOSIS — A419 Sepsis, unspecified organism: Secondary | ICD-10-CM | POA: Diagnosis not present

## 2020-07-22 DIAGNOSIS — Z515 Encounter for palliative care: Secondary | ICD-10-CM

## 2020-07-22 HISTORY — PX: AMPUTATION: SHX166

## 2020-07-22 LAB — CBC WITH DIFFERENTIAL/PLATELET
Abs Immature Granulocytes: 0.06 10*3/uL (ref 0.00–0.07)
Basophils Absolute: 0 10*3/uL (ref 0.0–0.1)
Basophils Relative: 0 %
Eosinophils Absolute: 0 10*3/uL (ref 0.0–0.5)
Eosinophils Relative: 0 %
HCT: 31.5 % — ABNORMAL LOW (ref 39.0–52.0)
Hemoglobin: 10.3 g/dL — ABNORMAL LOW (ref 13.0–17.0)
Immature Granulocytes: 1 %
Lymphocytes Relative: 3 %
Lymphs Abs: 0.4 10*3/uL — ABNORMAL LOW (ref 0.7–4.0)
MCH: 30.7 pg (ref 26.0–34.0)
MCHC: 32.7 g/dL (ref 30.0–36.0)
MCV: 93.8 fL (ref 80.0–100.0)
Monocytes Absolute: 0.5 10*3/uL (ref 0.1–1.0)
Monocytes Relative: 4 %
Neutro Abs: 12.1 10*3/uL — ABNORMAL HIGH (ref 1.7–7.7)
Neutrophils Relative %: 92 %
Platelets: 127 10*3/uL — ABNORMAL LOW (ref 150–400)
RBC: 3.36 MIL/uL — ABNORMAL LOW (ref 4.22–5.81)
RDW: 13.3 % (ref 11.5–15.5)
WBC: 13.2 10*3/uL — ABNORMAL HIGH (ref 4.0–10.5)
nRBC: 0 % (ref 0.0–0.2)

## 2020-07-22 LAB — COMPREHENSIVE METABOLIC PANEL
ALT: 105 U/L — ABNORMAL HIGH (ref 0–44)
AST: 114 U/L — ABNORMAL HIGH (ref 15–41)
Albumin: 1 g/dL — ABNORMAL LOW (ref 3.5–5.0)
Alkaline Phosphatase: 98 U/L (ref 38–126)
Anion gap: 6 (ref 5–15)
BUN: 19 mg/dL (ref 8–23)
CO2: 25 mmol/L (ref 22–32)
Calcium: 8 mg/dL — ABNORMAL LOW (ref 8.9–10.3)
Chloride: 107 mmol/L (ref 98–111)
Creatinine, Ser: 0.69 mg/dL (ref 0.61–1.24)
GFR, Estimated: >60 mL/min (ref >60)
Glucose, Bld: 141 mg/dL — ABNORMAL HIGH (ref 70–99)
Potassium: 3.1 mmol/L — ABNORMAL LOW (ref 3.5–5.1)
Sodium: 138 mmol/L (ref 135–145)
Total Bilirubin: 0.8 mg/dL (ref 0.3–1.2)
Total Protein: 4.5 g/dL — ABNORMAL LOW (ref 6.5–8.1)

## 2020-07-22 LAB — CULTURE, BLOOD (SINGLE)
Culture: NO GROWTH
Culture: NO GROWTH

## 2020-07-22 LAB — PHOSPHORUS: Phosphorus: 2.2 mg/dL — ABNORMAL LOW (ref 2.5–4.6)

## 2020-07-22 LAB — LACTIC ACID, PLASMA: Lactic Acid, Venous: 2.3 mmol/L (ref 0.5–1.9)

## 2020-07-22 SURGERY — AMPUTATION, ABOVE KNEE
Anesthesia: Regional | Site: Knee | Laterality: Right

## 2020-07-22 MED ORDER — CEFAZOLIN SODIUM-DEXTROSE 2-4 GM/100ML-% IV SOLN: 2.0000 g | INTRAVENOUS | Status: DC

## 2020-07-22 MED ORDER — SODIUM CHLORIDE 0.9 % IV SOLN: INTRAVENOUS | Status: DC

## 2020-07-22 MED ORDER — ASCORBIC ACID 500 MG PO TABS
1000.0000 mg | ORAL_TABLET | Freq: Every day | ORAL | Status: DC
  Administered 2020-07-23 – 2020-07-27 (×5): 1000 mg via ORAL
  Filled 2020-07-22 (×5): qty 2

## 2020-07-22 MED ORDER — LIDOCAINE-EPINEPHRINE (PF) 1.5 %-1:200000 IJ SOLN
INTRAMUSCULAR | Status: DC | PRN
  Administered 2020-07-22: 20 mL via PERINEURAL

## 2020-07-22 MED ORDER — FENTANYL CITRATE (PF) 100 MCG/2ML IJ SOLN: 25.0000 ug | INTRAMUSCULAR | Status: DC | PRN

## 2020-07-22 MED ORDER — ONDANSETRON HCL 4 MG/2ML IJ SOLN
INTRAMUSCULAR | Status: AC
  Filled 2020-07-22: qty 2

## 2020-07-22 MED ORDER — POTASSIUM CHLORIDE 10 MEQ/100ML IV SOLN
10.0000 meq | INTRAVENOUS | Status: AC
  Administered 2020-07-22 (×4): 10 meq via INTRAVENOUS
  Filled 2020-07-22 (×4): qty 100

## 2020-07-22 MED ORDER — AMISULPRIDE (ANTIEMETIC) 5 MG/2ML IV SOLN: 10.0000 mg | Freq: Once | INTRAVENOUS | Status: DC | PRN

## 2020-07-22 MED ORDER — ONDANSETRON HCL 4 MG/2ML IJ SOLN
INTRAMUSCULAR | Status: DC | PRN
  Administered 2020-07-22: 4 mg via INTRAVENOUS

## 2020-07-22 MED ORDER — LACTATED RINGERS IV SOLN: INTRAVENOUS | Status: DC | PRN

## 2020-07-22 MED ORDER — JUVEN PO PACK
1.0000 | PACK | Freq: Two times a day (BID) | ORAL | Status: DC
  Administered 2020-07-23 – 2020-07-26 (×6): 1 via ORAL
  Filled 2020-07-22 (×6): qty 1

## 2020-07-22 MED ORDER — KETAMINE HCL 10 MG/ML IJ SOLN
INTRAMUSCULAR | Status: DC | PRN
  Administered 2020-07-22 (×6): 5 mg via INTRAVENOUS

## 2020-07-22 MED ORDER — POTASSIUM & SODIUM PHOSPHATES 280-160-250 MG PO PACK
2.0000 | PACK | Freq: Three times a day (TID) | ORAL | Status: AC
  Administered 2020-07-22 – 2020-07-23 (×2): 2 via ORAL
  Filled 2020-07-22 (×3): qty 2

## 2020-07-22 MED ORDER — ONDANSETRON HCL 4 MG/2ML IJ SOLN: 4.0000 mg | Freq: Once | INTRAMUSCULAR | Status: DC | PRN

## 2020-07-22 MED ORDER — 0.9 % SODIUM CHLORIDE (POUR BTL) OPTIME
TOPICAL | Status: DC | PRN
  Administered 2020-07-22: 1000 mL

## 2020-07-22 MED ORDER — PHENOL 1.4 % MT LIQD: 1.0000 | OROMUCOSAL | Status: DC | PRN

## 2020-07-22 MED ORDER — ACETAMINOPHEN 10 MG/ML IV SOLN
1000.0000 mg | Freq: Three times a day (TID) | INTRAVENOUS | Status: AC
  Administered 2020-07-22 – 2020-07-23 (×3): 1000 mg via INTRAVENOUS
  Filled 2020-07-22 (×3): qty 100

## 2020-07-22 MED ORDER — CHLORHEXIDINE GLUCONATE 0.12 % MT SOLN
OROMUCOSAL | Status: AC
  Filled 2020-07-22: qty 15

## 2020-07-22 MED ORDER — PROPOFOL 10 MG/ML IV BOLUS
INTRAVENOUS | Status: DC | PRN
  Administered 2020-07-22: 5 mg via INTRAVENOUS
  Administered 2020-07-22: 10 mg via INTRAVENOUS
  Administered 2020-07-22 (×2): 5 mg via INTRAVENOUS
  Administered 2020-07-22: 10 mg via INTRAVENOUS
  Administered 2020-07-22 (×3): 5 mg via INTRAVENOUS

## 2020-07-22 MED ORDER — HEPARIN SODIUM (PORCINE) 5000 UNIT/ML IJ SOLN
5000.0000 [IU] | Freq: Three times a day (TID) | INTRAMUSCULAR | Status: DC
  Filled 2020-07-22: qty 1

## 2020-07-22 MED ORDER — ZINC SULFATE 220 (50 ZN) MG PO CAPS
220.0000 mg | ORAL_CAPSULE | Freq: Every day | ORAL | Status: DC
  Administered 2020-07-23 – 2020-07-27 (×5): 220 mg via ORAL
  Filled 2020-07-22 (×5): qty 1

## 2020-07-22 MED ORDER — LIDOCAINE-EPINEPHRINE 1.5 %-1:200,000 OPTIME - NO CHARGE
INTRAMUSCULAR | Status: DC | PRN
  Administered 2020-07-22: 5 mL

## 2020-07-22 MED ORDER — GUAIFENESIN-DM 100-10 MG/5ML PO SYRP: 15.0000 mL | ORAL_SOLUTION | ORAL | Status: DC | PRN

## 2020-07-22 MED ORDER — ONDANSETRON HCL 4 MG/2ML IJ SOLN
4.0000 mg | Freq: Four times a day (QID) | INTRAMUSCULAR | Status: DC | PRN
  Administered 2020-07-23 (×2): 4 mg via INTRAVENOUS
  Filled 2020-07-22 (×2): qty 2

## 2020-07-22 MED ORDER — POTASSIUM CHLORIDE 10 MEQ/100ML IV SOLN: 10.0000 meq | INTRAVENOUS | Status: DC

## 2020-07-22 MED ORDER — DEXMEDETOMIDINE (PRECEDEX) IN NS 20 MCG/5ML (4 MCG/ML) IV SYRINGE
PREFILLED_SYRINGE | INTRAVENOUS | Status: DC | PRN
  Administered 2020-07-22: 4 ug via INTRAVENOUS

## 2020-07-22 MED ORDER — POTASSIUM & SODIUM PHOSPHATES 280-160-250 MG PO PACK
2.0000 | PACK | Freq: Three times a day (TID) | ORAL | Status: DC
  Filled 2020-07-22 (×2): qty 2

## 2020-07-22 MED ORDER — FENTANYL CITRATE (PF) 100 MCG/2ML IJ SOLN
INTRAMUSCULAR | Status: AC
  Filled 2020-07-22: qty 2

## 2020-07-22 MED ORDER — ALBUMIN HUMAN 5 % IV SOLN: INTRAVENOUS | Status: DC | PRN

## 2020-07-22 MED ORDER — LACTATED RINGERS IV SOLN: INTRAVENOUS | Status: DC

## 2020-07-22 MED ORDER — CEFAZOLIN SODIUM-DEXTROSE 2-4 GM/100ML-% IV SOLN
2.0000 g | INTRAVENOUS | Status: AC
  Administered 2020-07-22: 2 g via INTRAVENOUS
  Filled 2020-07-22: qty 100

## 2020-07-22 MED ORDER — KETAMINE HCL 50 MG/5ML IJ SOSY
PREFILLED_SYRINGE | INTRAMUSCULAR | Status: AC
  Filled 2020-07-22: qty 5

## 2020-07-22 SURGICAL SUPPLY — 44 items
ADH SKN CLS APL DERMABOND .7 (GAUZE/BANDAGES/DRESSINGS) ×1
BLADE SAW RECIP 87.9 MT (BLADE) ×2 IMPLANT
BLADE SURG 21 STRL SS (BLADE) ×2 IMPLANT
BNDG COHESIVE 6X5 TAN STRL LF (GAUZE/BANDAGES/DRESSINGS) ×2 IMPLANT
CANISTER WOUND CARE 500ML ATS (WOUND CARE) ×1 IMPLANT
COVER SURGICAL LIGHT HANDLE (MISCELLANEOUS) ×2 IMPLANT
COVER WAND RF STERILE (DRAPES) IMPLANT
CUFF TOURN SGL QUICK 34 (TOURNIQUET CUFF)
CUFF TRNQT CYL 34X4.125X (TOURNIQUET CUFF) IMPLANT
DERMABOND ADVANCED (GAUZE/BANDAGES/DRESSINGS) ×1
DERMABOND ADVANCED .7 DNX12 (GAUZE/BANDAGES/DRESSINGS) IMPLANT
DRAPE DERMATAC (DRAPES) ×1 IMPLANT
DRAPE INCISE IOBAN 66X45 STRL (DRAPES) ×4 IMPLANT
DRAPE U-SHAPE 47X51 STRL (DRAPES) ×2 IMPLANT
DRESSING PREVENA PLUS CUSTOM (GAUZE/BANDAGES/DRESSINGS) ×1 IMPLANT
DRSG PREVENA PLUS CUSTOM (GAUZE/BANDAGES/DRESSINGS) ×2
DURAPREP 26ML APPLICATOR (WOUND CARE) ×2 IMPLANT
ELECT REM PT RETURN 9FT ADLT (ELECTROSURGICAL) ×2
ELECTRODE REM PT RTRN 9FT ADLT (ELECTROSURGICAL) ×1 IMPLANT
GLOVE BIOGEL PI IND STRL 7.5 (GLOVE) ×1 IMPLANT
GLOVE BIOGEL PI IND STRL 9 (GLOVE) ×1 IMPLANT
GLOVE BIOGEL PI INDICATOR 7.5 (GLOVE) ×1
GLOVE BIOGEL PI INDICATOR 9 (GLOVE) ×1
GLOVE SURG ORTHO 9.0 STRL STRW (GLOVE) ×2 IMPLANT
GLOVE SURG SS PI 6.5 STRL IVOR (GLOVE) ×2 IMPLANT
GOWN STRL REUS W/ TWL LRG LVL3 (GOWN DISPOSABLE) ×1 IMPLANT
GOWN STRL REUS W/ TWL XL LVL3 (GOWN DISPOSABLE) ×2 IMPLANT
GOWN STRL REUS W/TWL LRG LVL3 (GOWN DISPOSABLE) ×2
GOWN STRL REUS W/TWL XL LVL3 (GOWN DISPOSABLE) ×4
KIT BASIN OR (CUSTOM PROCEDURE TRAY) ×2 IMPLANT
KIT TURNOVER KIT B (KITS) ×2 IMPLANT
MANIFOLD NEPTUNE II (INSTRUMENTS) ×2 IMPLANT
NS IRRIG 1000ML POUR BTL (IV SOLUTION) ×2 IMPLANT
PACK ORTHO EXTREMITY (CUSTOM PROCEDURE TRAY) ×2 IMPLANT
PAD ARMBOARD 7.5X6 YLW CONV (MISCELLANEOUS) ×2 IMPLANT
PREVENA RESTOR ARTHOFORM 46X30 (CANNISTER) ×1 IMPLANT
STAPLER VISISTAT 35W (STAPLE) IMPLANT
STOCKINETTE IMPERVIOUS LG (DRAPES) ×1 IMPLANT
SUT ETHILON 2 0 PSLX (SUTURE) ×4 IMPLANT
SUT SILK 2 0 (SUTURE) ×2
SUT SILK 2-0 18XBRD TIE 12 (SUTURE) ×1 IMPLANT
TOWEL GREEN STERILE FF (TOWEL DISPOSABLE) ×2 IMPLANT
TUBE CONNECTING 20X1/4 (TUBING) ×2 IMPLANT
YANKAUER SUCT BULB TIP NO VENT (SUCTIONS) ×2 IMPLANT

## 2020-07-22 NOTE — Consult Note (Signed)
Palliative Medicine Inpatient Consult Note  Reason for consult:  Goals of Care "Pt clinically deteriorating, today having respiratory distress and trouble with clearing his secretions, possibly due to worsened infection, but prognosis is guarded."  HPI:  Per intake H&P --> Rick Schwartz is a 80 y.o. male with medical history significant for HTN, PAD, cerebral palsy, non-verbal at baseline. Admitted for severe sepsis d/t right foot gangrene. Plan for surgical amputation.  Overnight, Rick Schwartz had worsening symptoms of sepsis. Palliative care was consulted to further address goals of care with patients sister, Rick Schwartz.  Clinical Assessment/Goals of Care:  *Please note that this is a verbal dictation therefore any spelling or grammatical errors are due to the "[place]" system interpretation.  I have reviewed medical records including EPIC notes, labs and imaging, received report from bedside RN, assessed the patient.    I called patient's sister Rick Schwartz to further discuss diagnosis prognosis, GOC, EOL wishes, disposition and options.   I introduced Palliative Medicine as specialized medical care for people living with serious illness. It focuses on providing relief from the symptoms and stress of a serious illness. The goal is to improve quality of life for both the patient and the family.  A brief life review was completed. Rick Schwartz has suffered from cerebral palsy throughout his life. He is nonverbal at baseline and relies on a 24/7 caregiver for all bADL's.  Prior to admission he was able to eat and drink with assistance.  Right has never been married, he has no children.  He has 3 living sisters.  A detailed discussion was had today regarding advanced directives -there are no advanced directives on file.  [name] Rick Schwartz was prior the decision maker for him though per conversations with Rick Schwartz more recently it has been her.  Decisions made for Rick Schwartz are discussed amongst all  sisters.   Concepts specific to code status, artifical feeding and hydration, continued IV antibiotics and rehospitalization was had.  Patient is DO NOT RESUSCITATE DO NOT INTUBATE CODE STATUS.  Would not wish for extraneous heroic measures.  The difference between a aggressive medical intervention path  and a palliative comfort care path for this patient at this time was had.  The goals at this time are for Rick Schwartz to get his right lower extremity amputated to decrease the amount of pain that he had been in.    Provided Rick Schwartz an update from a medical perspective per chart review.  Provided review of prior medical conditions and laboratory results.  Provided a review of the plan of care.  Further discussed Rick Schwartz's poor clinical state in the setting of his peripheral arterial disease, and cerebral palsy.  Reviewed that his albumin levels are exceptionally low which is a poor prognosticator.  Introduced the topic of hospice.  I described hospice as a service for patients for have a life expectancy of < 6 months. It preserves dignity and quality at the end phases of life. The focus changes from curative to symptom relief.  [name] was familiar with these concepts.  At this point in time plan will be to medically optimize Rick Schwartz and ideally thereafter find placement.  From a social perspective Rick Schwartz will now need long-term placement.  Per review of his insurance he has Medicare and Medicaid.  This will further be discussed with the transitions of care team.  Discussed the importance of continued conversation with family and their  medical providers regarding overall plan of care and treatment options, ensuring decisions are  within the context of the patients values and GOCs.  Decision Maker: Rick Schwartz (sister) - [phone]  SUMMARY OF RECOMMENDATIONS   DNAR/DNI  Plan for orthopedics to perform right lower extremity amputation this morning  Goals per discussion with family are to optimize  pain management  Patient will now need long-term placement this will be discussed with the transitions of care team  Will request outpatient palliative care follow-up upon discharge. If patient continues to clinically deteriorate hospice is a very reasonable option  Ongoing incremental palliative care conversations will ensue in the oncoming days  Code Status/Advance Care Planning: DNAR/DNI   Palliative Prophylaxis:   Oral care, every 2 hours, delirium precautions, aspiration precautions, optimize pain control  Additional Recommendations (Limitations, Scope, Preferences):  Continue to treat what is treatable   Psycho-social/Spiritual:   Desire for further Chaplaincy support:  No  Additional Recommendations:  Education on chronic disease processes   Prognosis:  80 year old male with multiple comorbidities, dependent at baseline for all bADLs, hypoalbuminemia.  Prognosis for this gentleman is quite poor overall.  Discharge Planning:   Vitals:   07/22/20 0400 07/22/20 0605  BP: (!) 94/51   Pulse: 78   Resp: 19   Temp: (!) 96.8 F (36 C)   SpO2: 95% 97%    Intake/Output Summary (Last 24 hours) at 07/22/2020 5885 Last data filed at 07/22/2020 0003 Gross per 24 hour  Intake 3729.29 ml  Output 700 ml  Net 3029.29 ml   Last Weight  Most recent update: 07/22/2020  5:38 AM   Weight  44.9 kg (99 lb)           Gen:  Very ill appearing elderly AA M HEENT: Dry mucous membranes CV: Regular rate and rhythm, no murmurs rubs or gallops PULM: NRB FM on ABD: soft/nontender  EXT: RLE w/ pungent odor, eschar present, wet Neuro: Opens eyes and responds to name  PPS: 10%   This conversation/these recommendations were discussed with FMT.  Time In: 0640 Time Out: 0750 Total Time: 70 Greater than 50%  of this time was spent counseling and coordinating care related to the above assessment and plan.  [facility] Team Team Cell Phone:  [phone] Please utilize secure chat with additional questions, if there is no response within 30 minutes please call the above phone number  Palliative Medicine Team providers are available by phone from 7am to 7pm daily and can be reached through the team cell phone.  Should this patient require assistance outside of these hours, please call the patient's attending physician.

## 2020-07-22 NOTE — Progress Notes (Signed)
   07/22/20 0400  Assess: MEWS Score  Temp (!) 96.8 F (36 C)  BP (!) 94/51  Pulse Rate 78  ECG Heart Rate 74  Resp 19  SpO2 95 %  O2 Device Non-rebreather Mask  O2 Flow Rate (L/min) 15 L/min  Assess: MEWS Score  MEWS Temp 1  MEWS Systolic 1  MEWS Pulse 0  MEWS RR 0  MEWS LOC 0  MEWS Score 2  MEWS Score Color Yellow  Assess: if the MEWS score is Yellow or Red  Were vital signs taken at a resting state? Yes  Focused Assessment Change from prior assessment (see assessment flowsheet)  Early Detection of Sepsis Score *See Row Information* High  MEWS guidelines implemented *See Row Information* No, other (Comment) (pt's BP have been in the 90's throughout the night)  Document  Patient Outcome Not stable and remains on department  Progress note created (see row info) Yes

## 2020-07-22 NOTE — Op Note (Signed)
07/22/2020  10:37 AM  PATIENT:  Rick Schwartz    PRE-OPERATIVE DIAGNOSIS:  Gangrene Right Foot  POST-OPERATIVE DIAGNOSIS:  Same  PROCEDURE:  RIGHT ABOVE KNEE AMPUTATION Application Prevena customizable wound VAC.  SURGEON:  [name], MD  PHYSICIAN ASSISTANT:None ANESTHESIA:   General  PREOPERATIVE INDICATIONS:  Rick Schwartz is a  80 y.o. male with a diagnosis of Gangrene Right Foot who failed conservative measures and elected for surgical management.    The risks benefits and alternatives were discussed with the patient preoperatively including but not limited to the risks of infection, bleeding, nerve injury, cardiopulmonary complications, the need for revision surgery, among others, and the patient was willing to proceed.  OPERATIVE IMPLANTS: Praveena customizable wound VAC.  @ENCIMAGES @  OPERATIVE FINDINGS: Calcified vessels good petechial bleeding and muscle with good color contractility and consistency at the amputation site.  No necrotic fascia no abscess.  OPERATIVE PROCEDURE: Patient was brought the operating room after undergoing a regional anesthetic.  After adequate levels anesthesia were obtained patient's right lower extremity was prepped using DuraPrep draped into a sterile field a timeout was called.  A fishmouth incision was made through the mid thigh.  Electrocautery was used to dissect through the muscle there was extremely calcified vessels that were cauterized with electrocautery.  The vascular bundle medially was clamped and suture ligated with 2-0 silk.  The amputation was completed the femur was transected with a reciprocating saw amputation was completed electrocautery was used hemostasis.  The deep and superficial fascia layers and skin was closed using 2-0 nylon this was reinforced with staples the Praveena customizable wound VAC was applied this was outlined with derma tack this had a good suction fit patient was taken the PACU in stable  condition.   DISCHARGE PLANNING:  Antibiotic duration: Continue IV antibiotics for patient's sepsis  Weightbearing: Not applicable  Pain medication: tylenot  Dressing care/ Wound VAC:vac for 1 week  Ambulatory devices:NA  Discharge to: SNF  Follow-up: In the office 1 week post operative.

## 2020-07-22 NOTE — Interval H&P Note (Signed)
History and Physical Interval Note:  07/22/2020 6:56 AM  Rick Schwartz  has presented today for surgery, with the diagnosis of Gangrene Right Foot.  The various methods of treatment have been discussed with the patient and family. After consideration of risks, benefits and other options for treatment, the patient has consented to  Procedure(s): RIGHT ABOVE KNEE AMPUTATION (Right) as a surgical intervention.  The patient's history has been reviewed, patient examined, no change in status, stable for surgery.  I have reviewed the patient's chart and labs.  Questions were answered to the patient's satisfaction.     [name]

## 2020-07-22 NOTE — Progress Notes (Signed)
Orthopedic Tech Progress Note Patient Details:  GIAVANNI ODONOVAN 1940/05/11 [id number] Ordered brace Patient ID: Sueanne Margarita, male   DOB: 1940-08-25, 80 y.o.   MRN: [id number]   Ellouise Newer 07/22/2020, 12:29 PM

## 2020-07-22 NOTE — Progress Notes (Signed)
K 3.1, pt is going for surgery this am for a R knee amputation, notified orthopedic MD on call, will continue to monitor, Thanks Arvella Nigh RN.

## 2020-07-22 NOTE — Progress Notes (Signed)
FPTS Interim Progress Note  Paged in regards to patient's hypotension.  See Dr. Lynnda Child notes for further details.  Spoke with patient's Medical Decision Maker, Gretchen Short.  Advised that if BP cannot improve with fluids, next step would be move to ICU and pressors.  She voiced that she would like this to occur if medically indicated and would be agreeable to patient being moved to the ICU and being started on pressors.  Advised that if this occurs, would call and let her know.    He will remain DNR/DNI.  [place], DO 07/22/2020, 9:53 PM PGY-3, [place] Service pager [phone]

## 2020-07-22 NOTE — Progress Notes (Addendum)
FPTS Interim Progress Note  S: Went to evaluate patient postop.  Patient laying comfortably in bed in no acute distress.  Was sleeping initially but easily arousable.  Responds to questions appropriately.  Denies any pain at this time.  When asked about his last bowel movement he reports that he had 1 yesterday.  He reports that he typically does not have any issues urinating.  He has no questions or concerns at this time.  O: BP 107/60   Pulse 75   Temp (!) 97.2 F (36.2 C) (Axillary)   Resp 18   Ht 5\' 6"  (1.676 m)   Wt 44.9 kg Comment: took off the alternate blower to weigh pt  SpO2 97%   BMI 15.98 kg/m   General: Chronically ill-appearing 80 year old male in no acute distress, laying comfortably in bed Cardiac: Regular rate, no murmurs appreciated Respiratory: Normal work of breathing, no supplemental oxygen on at this time Extremities: Wound VAC on right lower extremity s/p AKA today.  No significant bleeding or drainage from wound site.  Suction for wound VAC intact. GU: On evaluation patient is having in and out catheterization given bladder scan of 525.  Urine is bright orange.  Approximately 500 mL of urine output but urine leaked around in and out Foley catheter using approximately 100-200 mL of urine in the bag. 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  Managed by Ortho.  We will continue antibiotics at this time and monitor for signs or symptoms of worsening infection  Concern for urinary retention Bladder scan showed over 500 mL of urine.  In-N-Out cath with greater than 500 mL of urine output.  We will continue to BladderScan every 8 hours.  Before further in and out catheters please be sure to asked the patient to try and urinate first.  Concern for ileus Abdominal exam shows tense abdomen without considerable distention or pain.  Patient reports that he had a  bowel movement yesterday.  Will obtain KUB to evaluate for worsening signs of ileus.  Gifford Shave, MD 07/22/2020, 3:51 PM PGY-2, [place] Service pager [phone]  Will sign full resident note as available. Seen and examined with Dr. Caron Presume at 1500.    Dorris Singh, MD  Team Pager [phone] Pager [phone]

## 2020-07-22 NOTE — Anesthesia Procedure Notes (Signed)
Anesthesia Regional Block: Popliteal block (Sciatic)   Pre-Anesthetic Checklist: ,, timeout performed, Correct Patient, Correct Site, Correct Laterality, Correct Procedure,, site marked, risks and benefits discussed, Surgical consent,  Pre-op evaluation,  At surgeon's request and post-op pain management  Laterality: Right  Prep: chloraprep       Needles:  Injection technique: Single-shot  Needle Type: Echogenic Stimulator Needle     Needle Length: 10cm  Needle Gauge: 20     Additional Needles:   Procedures:, nerve stimulator,,, ultrasound used (permanent image in chart),,,,   Nerve Stimulator or Paresthesia:  Response: Calf contraction, 0.8 mA,   Additional Responses:   Narrative:  Start time: 07/22/2020 9:20 AM End time: 07/22/2020 9:30 AM Injection made incrementally with aspirations every 5 mL.  Performed by: Personally  Anesthesiologist: Murvin Natal, MD  Additional Notes: Functioning IV was confirmed and monitors were applied.  A 155mm 20ga BBraun echogenic stimulator needle was used. Sterile prep and drape,hand hygiene and sterile gloves were used.  Negative aspiration and negative test dose prior to incremental administration of local anesthetic. The patient tolerated the procedure well.

## 2020-07-22 NOTE — Progress Notes (Addendum)
Family Medicine Teaching Service Daily Progress Note Intern Pager: [phone]  Patient name: Rick Schwartz Medical record number: [id number] Date of birth: [birth year] Age: 80 y.o. Gender: male  Primary Care Provider: Matilde Haymaker, MD Consultants: Orthopedics, palliative care, PCCM Code Status: DNR  Pt Overview and Major Events to Date:  5/1: Admitted to triage hospitalist 5/5: Rapid response called for sepsis 5/5: PCCM consulted 5/6: Admitted to FMTS 5/6: Scheduled for R above-the-knee amputation  Assessment and Plan: 80 year old male admitted on 5/1 for gangrene of foot and severe sepsis from infected foot ulcer.  PMH significant for CP, PAD, HTN, cerebral palsy.  Severe sepsis likely 2/2 gangrene of right forefoot with nonhealing foot ulcer  PAD Patient is scheduled for right above-knee amputation today.  Previously patient was admitted to [facility] hospitalist and transferred to our service today.  Patient has severe sepsis POA with leukocytosis, tachycardia, lactic acid 2.3 consistent with organ dysfunction.  X-ray of foot on 5/1 suggested gas within plantar soft tissue of foot highly suspicious for soft tissue infection with no evidence of acute osteomyelitis.  --Ortho following, plan for above-knee amputation today,appreciate their help --Continue n.p.o. status, will --Continue IV fluids --Empiric IV vancomycin, Zosyn --Trend lactate, CBC in am    Acute hypoxic respiratory failure may be 2/2 sepsis or ?  Pneumonia Patient n.p.o. today.  Chest x-ray on 5/5 shows increasing left retrocardiac density consistent with atelectasis/infiltrate.  Patient has history of dysphagia and SLP was consulted on 5/3 which recommended dysphagia 2 diet. -- f/u SLP eval -- Monitor closely   ?  Ileus Bowel dilatation without air-fluid levels on abdominal x-ray on 5/5.  Suspects a degree of ileus or potential enteritis.    --Monitor closely --Low threshold to place NG tube -- Consider imaging if  symptomatic  Left upper extremity edema Right subclavian vein DVT Venous duplex shows acute deep vein thrombosis involving the right subclavian vein.  No evidence of DVT in upper extremities and area imaged or superficial vein thrombosis in upper extremities and left.  Currently not on any DVT prophylaxis.  Given he is going for surgery today, consider therapeutic dosing post surgery. -- Monitor closely   Transaminitis likely 2/2 sepsis  AST 114, ALT 105 Upper quadrant ultrasound shows probable tumefactive sludge within the gallbladder.  No cholelithiasis or evidence of cholecystitis, normal liver appearance -- Monitor LFTs  Electrolyte derangements  Hypokalemia 3.1, hypophosphatemia 2.2 -- Replete as needed   History of cerebral palsy Right leg contracture Patient is verbal, interactive with short answers.  He has history of contractures and had plan by PCP to try on baclofen due to stroke evaluation being done at that time. 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  Palliative care consulted.  They had a conversation with patient and patient's sister about long-term goals and and they would like to optimize pain management and suggested outpatient palliative care follow-up upon discharge. -- Hold baclofen --Appreciate palliative care recommendations --Patient is DNR  Severe protein calorie malnutrition Most likely due to chronic illness, wound healing.  BMI is 15.33 kg/mm -- Nutrition supplement Juven twice daily  H/O hypertension Home patient is on lisinopril-HCTZ 20-25 mg and metoprolol 200 mg twice daily. -Hold home medications for now due to hypotension  H/o prostate cancer --Patient was previously following with urology, last follow-up ~5 years ago.    FEN/GI: NPO PPx:SCD's   Status is: Inpatient  Remains inpatient appropriate because:Inpatient level of care appropriate due to severity of illness   Dispo:  The patient is from: Home               Anticipated d/c is to: Home              Patient currently is not medically stable to d/c.   Difficult to place patient No        Subjective:  Patient evaluated at bedside this morning, ready to leave for right above-knee amputation.  He is able to answer questions in one-word.  He states he is doing " fine".  He states he understands going for " surgery".  Objective: Temp:  [96.8 F (36 C)-97.4 F (36.3 C)] 97.4 F (36.3 C) (05/06 1616) Pulse Rate:  [75-103] 78 (05/06 1616) Resp:  [17-30] 20 (05/06 1616) BP: (94-109)/(33-60) 100/49 (05/06 1616) SpO2:  [95 %-100 %] 97 % (05/06 1143) Weight:  [99 lb (44.9 kg)] 99 lb (44.9 kg) (05/06 0400) Physical Exam: Unable to do full physical exam General: Patient lying curled up on right side in bed, NAD Respiratory: On nonrebreather mask 15 L/min Extremities: Right foot wound open, black ischemic eschar ~10 cm *10 cm Psych: Normal mood and affect  Laboratory: Recent Labs  Lab 07/20/20 0031 07/21/20 0436 07/22/20 0605  WBC 13.2* 19.2* 13.2*  HGB 13.0 13.1 10.3*  HCT 39.2 38.8* 31.5*  PLT 129* PLATELET CLUMPS NOTED ON SMEAR, COUNT APPEARS DECREASED 127*   Recent Labs  Lab 07/20/20 0031 07/21/20 0436 07/21/20 2319 07/22/20 0605  NA 138 137 137 138  K 3.9 3.6 3.6 3.1*  CL 109 103 105 107  CO2 23 23 24 25   BUN 18 14 24* 19  CREATININE 0.63 0.60* 0.69 0.69  CALCIUM 7.8* 8.2* 8.3* 8.0*  PROT 4.9* 5.5*  --  4.5*  BILITOT 0.5 1.3*  --  0.8  ALKPHOS 91 114  --  98  ALT 52* 82*  --  105*  AST 78* 109*  --  114*  GLUCOSE 125* 148* 145* 141*    Imaging/Diagnostic Tests: No results found.  Honor Junes, MD 07/22/2020, 6:03 PM PGY-1, [place] Intern pager: [phone], text pages welcome

## 2020-07-22 NOTE — Transfer of Care (Signed)
Immediate Anesthesia Transfer of Care Note  Patient: Rick Schwartz  Procedure(s) Performed: RIGHT ABOVE KNEE AMPUTATION (Right Knee)  Patient Location: PACU  Anesthesia Type:MAC  Level of Consciousness: drowsy and patient cooperative  Airway & Oxygen Therapy: Patient Spontanous Breathing and Patient connected to face mask oxygen  Post-op Assessment: Report given to RN and Post -op Vital signs reviewed and stable  Post vital signs: Reviewed and stable  Last Vitals:  Vitals Value Taken Time  BP 98/34 07/22/20 1036  Temp    Pulse    Resp 21 07/22/20 1040  SpO2    Vitals shown include unvalidated device data.  Last Pain:  Vitals:   07/22/20 0400  TempSrc: Axillary  PainSc:          Complications: No complications documented.

## 2020-07-22 NOTE — Progress Notes (Signed)
Pt's lactic acid earlier was 4.6, pt did have a moment where he had no IV access earlier during the day  but did get another IV later, pt is now receiving IV antibiotics and NS 75.  MD changed his meds to all IV meds due to his NPO status and aspiration precautions.  Pt's BP is low in the 90's but he has been this throughout the day.  MD ordered also some Magnesium IV and when this finished started his D51/2NS again. Pt does have severe sepsis but his VS are WNL, MD aware, will continue to monitor, Thanks Arvella Nigh RN.

## 2020-07-22 NOTE — Progress Notes (Signed)
FPTS Interim Progress Note  S: Evaluated patient at bedside after receiving page from RN regarding BP of 83/38, with slight improvement to 90s/40s on recheck.  No fluids hung at this time, appears to be some confusion with the orders.  Clarified with RN that there is a current order for NS at 75 mL/h.  At bedside, patient initially sleeping but was able to be aroused.  Denies any pain currently.  No concerns at this time.  IV fluids are running as ordered currently.  O: BP (!) 89/52 (BP Location: Right Arm)   Pulse 78   Temp 97.8 F (36.6 C) (Axillary)   Resp 17   Ht 5\' 6"  (1.676 m)   Wt 44.9 kg Comment: took off the alternate blower to weigh pt  SpO2 100%   BMI 15.98 kg/m   General: Chronically ill-appearing elderly male resting comfortably in bed, NAD Ext: Wound VAC in place on right lower extremity, appears well sealed with minimal bleeding Neuro: Somnolent but answers questions appropriately, oriented to self, place, and year  A/P: Hypotension secondary to underlying sepsis s/p right lower extremity amputation earlier today, partially confounded by monitoring (using cuff on right forearm) and discontinuation of fluids (appears to have been some confusion with orders).  We will continue IV fluids (NS at 75 mL/h) and IV antibiotics.  Family would want ICU level of care and pressors if needed (see Dr. Bella Kennedy separate note).  Will consult CCM if BP not improving with IV fluids.  Zola Button, MD 07/22/2020, 10:02 PM PGY-1, [place] Medicine Service pager [phone]

## 2020-07-22 NOTE — Progress Notes (Signed)
SLP Cancellation Note  Patient Details Name: KODI STEIL MRN: [id number] DOB: 1940-05-13   Cancelled treatment:       Reason Eval/Treat Not Completed: Patient at procedure or test/unavailable (for surgery today). Also note concern for ileus on imaging. Will f/u as medically appropriate.     Osie Bond., M.A. [place] [facility] Services Pager [phone] Office [phone]  07/22/2020, 7:37 AM

## 2020-07-23 DIAGNOSIS — E872 Acidosis: Secondary | ICD-10-CM | POA: Diagnosis not present

## 2020-07-23 DIAGNOSIS — R64 Cachexia: Secondary | ICD-10-CM

## 2020-07-23 DIAGNOSIS — L97513 Non-pressure chronic ulcer of other part of right foot with necrosis of muscle: Secondary | ICD-10-CM | POA: Diagnosis not present

## 2020-07-23 DIAGNOSIS — K56609 Unspecified intestinal obstruction, unspecified as to partial versus complete obstruction: Secondary | ICD-10-CM

## 2020-07-23 LAB — CBC WITH DIFFERENTIAL/PLATELET
Abs Immature Granulocytes: 0.06 10*3/uL (ref 0.00–0.07)
Basophils Absolute: 0 10*3/uL (ref 0.0–0.1)
Basophils Relative: 0 %
Eosinophils Absolute: 0.1 10*3/uL (ref 0.0–0.5)
Eosinophils Relative: 1 %
HCT: 27.2 % — ABNORMAL LOW (ref 39.0–52.0)
Hemoglobin: 9.1 g/dL — ABNORMAL LOW (ref 13.0–17.0)
Immature Granulocytes: 1 %
Lymphocytes Relative: 4 %
Lymphs Abs: 0.4 10*3/uL — ABNORMAL LOW (ref 0.7–4.0)
MCH: 30.8 pg (ref 26.0–34.0)
MCHC: 33.5 g/dL (ref 30.0–36.0)
MCV: 92.2 fL (ref 80.0–100.0)
Monocytes Absolute: 0.4 10*3/uL (ref 0.1–1.0)
Monocytes Relative: 4 %
Neutro Abs: 8.7 10*3/uL — ABNORMAL HIGH (ref 1.7–7.7)
Neutrophils Relative %: 90 %
Platelets: 114 10*3/uL — ABNORMAL LOW (ref 150–400)
RBC: 2.95 MIL/uL — ABNORMAL LOW (ref 4.22–5.81)
RDW: 13.4 % (ref 11.5–15.5)
WBC: 9.6 10*3/uL (ref 4.0–10.5)
nRBC: 0 % (ref 0.0–0.2)

## 2020-07-23 LAB — BLOOD GAS, ARTERIAL
Acid-Base Excess: 0.6 mmol/L (ref 0.0–2.0)
Bicarbonate: 24.1 mmol/L (ref 20.0–28.0)
Drawn by: 35062
FIO2: 28
O2 Saturation: 98.1 %
Patient temperature: 36.5
pCO2 arterial: 34.6 mmHg (ref 32.0–48.0)
pH, Arterial: 7.456 — ABNORMAL HIGH (ref 7.350–7.450)
pO2, Arterial: 112 mmHg — ABNORMAL HIGH (ref 83.0–108.0)

## 2020-07-23 LAB — LACTIC ACID, PLASMA
Lactic Acid, Venous: 1 mmol/L (ref 0.5–1.9)
Lactic Acid, Venous: 1.7 mmol/L (ref 0.5–1.9)

## 2020-07-23 LAB — COMPREHENSIVE METABOLIC PANEL
ALT: 66 U/L — ABNORMAL HIGH (ref 0–44)
AST: 101 U/L — ABNORMAL HIGH (ref 15–41)
Albumin: 1.3 g/dL — ABNORMAL LOW (ref 3.5–5.0)
Alkaline Phosphatase: 77 U/L (ref 38–126)
Anion gap: 11 (ref 5–15)
BUN: 11 mg/dL (ref 8–23)
CO2: 21 mmol/L — ABNORMAL LOW (ref 22–32)
Calcium: 7.4 mg/dL — ABNORMAL LOW (ref 8.9–10.3)
Chloride: 105 mmol/L (ref 98–111)
Creatinine, Ser: 0.51 mg/dL — ABNORMAL LOW (ref 0.61–1.24)
GFR, Estimated: >60 mL/min (ref >60)
Glucose, Bld: 80 mg/dL (ref 70–99)
Potassium: 3.6 mmol/L (ref 3.5–5.1)
Sodium: 137 mmol/L (ref 135–145)
Total Bilirubin: 0.7 mg/dL (ref 0.3–1.2)
Total Protein: 4.6 g/dL — ABNORMAL LOW (ref 6.5–8.1)

## 2020-07-23 MED ORDER — LACTATED RINGERS IV BOLUS
1000.0000 mL | Freq: Once | INTRAVENOUS | Status: AC
  Administered 2020-07-23: 1000 mL via INTRAVENOUS

## 2020-07-23 MED ORDER — SODIUM CHLORIDE 0.9 % IV BOLUS
1000.0000 mL | Freq: Once | INTRAVENOUS | Status: AC
  Administered 2020-07-23: 1000 mL via INTRAVENOUS

## 2020-07-23 MED ORDER — GERHARDT'S BUTT CREAM
TOPICAL_CREAM | Freq: Three times a day (TID) | CUTANEOUS | Status: DC
  Administered 2020-07-26 (×2): 1 via TOPICAL
  Filled 2020-07-23: qty 1

## 2020-07-23 NOTE — Progress Notes (Signed)
Patient continues to be hypotensive, slightly worsening with most recent BP readings 80s/30s with MAP 50-52.  Spoke with Dr. Carson Myrtle with critical care, recommended 1 L bolus of fluids and obtaining ABG and stated that he may need transfer to the ICU if not improving with fluids.  Orders placed for 1 L NS bolus and ABG, RN updated.  We will reach back out to critical care if not improving with bolus.

## 2020-07-23 NOTE — Progress Notes (Signed)
Palliative Medicine Inpatient Follow Up Note  Reason for consult:  Goals of Care "Pt clinically deteriorating, today having respiratory distress and trouble with clearing his secretions, possibly due to worsened infection, but prognosis is guarded."  HPI:  Per intake H&P --> Rick Manner L Thompsonis a 80 y.o.malewith medical history significant forHTN, PAD,cerebral palsy, non-verbal at baseline. Admitted for severe sepsis d/t right foot gangrene. Plan for surgical amputation.  Overnight, Rick Schwartz had worsening symptoms of sepsis. Palliative care was consulted to further address goals of care with patients sisters, Rick Schwartz and Rick Schwartz.  Today's Discussion (07/23/2020):  *Please note that this is a verbal dictation therefore any spelling or grammatical errors are due to the "[place] [facility]" system interpretation.  Chart reviewed. It appears Rick Schwartz was again, hypotensive last night requiring a bolus of IVF.   It seems there is some incongruence between what the patients family members have shared with the Palliative team and the Family medicine team. I have called [name] for clarification. She has expressed that she will come in to bedside with her sisters this morning for further conversations.  _______________________________________________ Addendum:  I met at bedside this morning with patient's sisters Rick Schwartz and Rick Schwartz.  Rick Schwartz I further reviewed Rick Schwartz's fragile health state.  Discussed that I worry if we send him to the intensive care unit we may pursue a variety of aggressive measures which are just putting a Band-Aid over a much bigger problem.  Rick Schwartz expresses understanding.    Rick Schwartz shares that overnight she felt very overwhelmed and did not know what to decide so she decided to pursue aggressive measures.  After thinking about it and speaking with her 2 sisters she has now decided otherwise and would not want Rick Schwartz to travel to the ICU.    [name]  The family medicine team rounded during my discussion with Rick Schwartz and shared empathy and appreciation for her making such a difficult decision.  [name]  [name]  Emotional support was provided through therapeutic listening.  Questions and concerns addressed   Objective Assessment: Vital Signs Vitals:   07/23/20 0400 07/23/20 0500  BP: (!) 96/46 (!) 98/41  Pulse:  (!) 55  Resp: 14 16  Temp: 97.6 F (36.4 C)   SpO2: 100% 100%    Intake/Output Summary (Last 24 hours) at 07/23/2020 0743 Last data filed at 07/23/2020 0600 Gross per 24 hour  Intake 991.17 ml  Output 1050 ml  Net -58.83 ml   Last Weight  Most recent update: 07/23/2020  4:27 AM   Weight  38.1 kg (84 lb)           Gen:  Very ill appearing elderly AA M HEENT: Dry mucous membranes CV: Regular rate and rhythm, no murmurs rubs or gallops PULM: 3LPM [place] ABD: soft/nontender  EXT: RLE AKA dressing on  Neuro: Opens eyes and responds to name aware of situation  SUMMARY OF RECOMMENDATIONS DNAR/DNI  MOST Completed, paper copy placed onto the chart electric copy can be found in [facility]  DNR Form Completed, paper copy placed onto the chart electric copy can be found in [place]  [name]  [name]  Ongoing incremental palliative care conversations will ensue in the oncoming days  Time Spent: 60 Greater than 50% of the time was spent in counseling and coordination of  care ______________________________________________________________________________________ [place] Team Team Cell Phone: [phone] Please utilize secure chat with additional questions, if there is no response within 30 minutes please call the above phone number  Palliative Medicine Team providers are available by phone from 7am to 7pm daily and can be reached through the team cell phone.  Should this patient require assistance outside of these hours, please call the patient's attending physician.

## 2020-07-23 NOTE — Plan of Care (Signed)

## 2020-07-23 NOTE — Evaluation (Signed)
Clinical/Bedside Swallow Evaluation Patient Details  Name: Rick Schwartz MRN: [id number] Date of Birth: 05/29/1940  Today's Date: 07/23/2020 Time: SLP Start Time (ACUTE ONLY): 7824 SLP Stop Time (ACUTE ONLY): 0855 SLP Time Calculation (min) (ACUTE ONLY): 17 min  Past Medical History:  Past Medical History:  Diagnosis Date  . Cancer [facility])    Prostate  . Cerebral palsy ([place])   . Hyperlipidemia   . Hypertension   . Peripheral arterial disease ([place])    critical limb ischemia   Past Surgical History:  Past Surgical History:  Procedure Laterality Date  . PROSTATE SURGERY     HPI:  Patient is a 80 y.o. male with PMH: HTN, PAD, cerebral palsy, who was admitted to [facility] from home with severe sepsis due to infected right foot ulcer. CXR was unremarkable.  His sister had reported that patient has had difficulty chewing and swallowing recently, leading to decreased intake.  Pt was seen for a BSE on 5/2 with recommendations for Dysphagia 2/thin liquid.  ST signed off and was re-consulted on 5/5 secondary to acute change.  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   Assessment / Plan / Recommendation Clinical Impression  Pt was seen for a repeat bedside swallow evaluation and he presents with a mild oral dysphagia.   Pt was noted to have frequent lingual movements at baseline in the absence of PO, with tongue resting at the bottom of the pt's oral cavity.  Suspect decreased lingual strength and control.  Pt is edentulous.  Pt was seen with trials of thin liquid via straw, puree, and regular solids.  He exhibited prolonged mastication (lingual mashing) of regular solid trial, but no oral residue was observed following swallow initiation.  AP transit of thin liquid and puree appeared to be timely, and no overt s/sx of aspiration were observed with any PO trials in this session.  RN additionally reported that pt was tolerating PO without difficulty.  Recommend a Dysphagia 2 (fine chop) diet and  thin liquids with medications administered whole in puree.  Cut/crush large pills.  Pt will require full assistance and supervision to assist with PO intake.  SLP will briefly f/u to monitor diet tolerance.  SLP Visit Diagnosis: Dysphagia, oral phase (R13.11)    Aspiration Risk  Mild aspiration risk    Diet Recommendation Dysphagia 2 (Fine chop);Thin liquid   Liquid Administration via: Straw Medication Administration: Whole meds with puree Supervision: Full supervision/cueing for compensatory strategies;Staff to assist with self feeding Compensations: Minimize environmental distractions;Slow rate;Small sips/bites Postural Changes: Other (Comment) (Seated upright as possible)    Other  Recommendations Oral Care Recommendations: Oral care BID;Staff/trained caregiver to provide oral care   Follow up Recommendations 24 hour supervision/assistance      Frequency and Duration min 1 x/week  1 week       Prognosis Prognosis for Safe Diet Advancement: Fair      Swallow Study   General Date of Onset: 07/17/20 HPI: Patient is a 80 y.o. male with PMH: HTN, PAD, cerebral palsy, who was admitted to [facility] from home with severe sepsis due to infected right foot ulcer. CXR was unremarkable.  His sister had reported that patient has had difficulty chewing and swallowing recently, leading to decreased intake.  Pt was seen for a BSE on 5/2 with recommendations for Dysphagia 2/thin liquid.  ST signed off and was re-consulted on 5/5 secondary to acute change.  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 Type of Study:  Bedside Swallow Evaluation Previous Swallow Assessment: See HPI Diet Prior to this Study: Regular;Thin liquids Temperature Spikes Noted: No Respiratory Status: Nasal cannula History of Recent Intubation: No Behavior/Cognition: Alert;Cooperative;Pleasant mood Oral Cavity Assessment: Within Functional Limits Oral Care Completed by SLP: No Oral Cavity - Dentition:  Edentulous Self-Feeding Abilities: Needs assist Patient Positioning: Upright in bed Baseline Vocal Quality: Low vocal intensity Volitional Cough: Cognitively unable to elicit Volitional Swallow: Unable to elicit    Oral/Motor/Sensory Function Overall Oral Motor/Sensory Function: Generalized oral weakness Facial ROM: Within Functional Limits Facial Symmetry: Within Functional Limits Facial Strength: Within Functional Limits Lingual ROM: Within Functional Limits Lingual Symmetry: Within Functional Limits Lingual Strength: Reduced Velum: Within Functional Limits   Ice Chips Ice chips: Not tested   Thin Liquid Thin Liquid: Within functional limits Presentation: Straw    Nectar Thick Nectar Thick Liquid: Not tested   Honey Thick Honey Thick Liquid: Not tested   Puree Puree: Within functional limits Presentation: Spoon   Solid     Solid: Impaired Presentation: Spoon Oral Phase Impairments: Impaired mastication Oral Phase Functional Implications: Impaired mastication;Prolonged oral transit     Rick Schwartz M.S., CCC-SLP Acute Rehabilitation Services Office: [phone]  [place] 07/23/2020,9:09 AM

## 2020-07-23 NOTE — Progress Notes (Signed)
Family Medicine Teaching Service Daily Progress Note Intern Pager: [phone]  Patient name: Rick Schwartz Medical record number: [id number] Date of birth: 07/30/1940 Age: 80 y.o. Gender: male  Primary Care Provider: Matilde Haymaker, MD Consultants: Orthopedics, palliative care, PCCM Code Status: DNR  Pt Overview and Major Events to Date:  5/1: Admitted to triage hospitalist 5/5: Rapid response called for sepsis 5/5: PCCM consulted 5/6: Admitted to FMTS 5/6: Received R above-the-knee amputation  Assessment and Plan: 80 year old male admitted on 5/1 for gangrene of foot and severe sepsis from infected foot ulcer.  PMH significant for CP, PAD, HTN, cerebral palsy.  Severe sepsis likely 2/2 gangrene of right forefoot with nonhealing foot ulcer  PAD Patient presented and was admitted for gangrene of right lower extremity.  Patient was transferred to family medicine service on 5/6 and was in severe sepsis with leukocytosis, tachycardia, lactic acid of 2.3.  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  Leukocytosis resolved and is now WBC 9.6. - Ortho following, appreciate recommendations - Continue to monitor morning CBCs - Continue IV Vanco and Zosyn - Vitals per routine - PT/OT eval and treat  Acute hypoxic respiratory failure may be 2/2 sepsis or ?  Pneumonia Chest x-ray on 5/5 shows increasing left retrocardiac density consistent with atelectasis/infiltrate.  Patient has history of dysphagia and SLP was consulted on 5/3 which recommended dysphagia 2 diet. -Follow-up further SLP evaluation - Assist with meals - Monitor for signs and symptoms of aspiration  Goals of care Patient's family reports that the patient is essentially back at his baseline being able to answer to his name, oriented to person.  Appears to be clinically improving after AKA.  We will continue to watch blood pressure and respiratory status.  We will continue to discuss goals of care with patient and family at  this time.  Patient is currently a DNR/DNI.  After discussion with the family we will continue on the course at this time but will not escalate to ICU care if it is needed.  If we feel he may be moving towards ICU care we will transition to more comfort based care. - Palliative care has been consulted, appreciate recommendations  Urinary retention Postop patient has had issues with urinary retention.  Bladder scans have been from 300-500.  He has had 3 in and out catheterizations.  If there is over 300 mL of urine on the next bladder scan he will need a indwelling Foley placed for bladder rest.  ?  Ileus Bowel dilatation without air-fluid levels on abdominal x-ray on 5/5.  Repeat KUB on 5/6 showed nonspecific bowel gas pattern without definitive evidence of bowel obstruction. -Continue to monitor - consider further imaging if indicated  Left upper extremity edema Right subclavian vein DVT Venous duplex shows acute deep vein thrombosis involving the right subclavian vein.  No evidence of DVT in upper extremities and area imaged or superficial vein thrombosis in upper extremities and left.  Given recent above-the-knee amputation there is considerable risk of bleeding anticoagulation is being held at this time.  Will discuss with Ortho and consider therapeutic anticoagulation if able.  Transaminitis likely 2/2 sepsis  AST 114, ALT 105 on arrival.  Improved at 101/66 today. Upper quadrant ultrasound shows probable tumefactive sludge within the gallbladder.  No cholelithiasis or evidence of cholecystitis, normal liver appearance -- Monitor LFTs - Avoid hepatotoxic agents  Electrolyte derangements  Hypokalemia 3.1, hypophosphatemia 2.2 on arrival.  Improved today with K of 3.6. - Continue to monitor with morning  BMPs -- Replete as needed   History of cerebral palsy Right leg contracture Patient is verbal, interactive with short answers.  He has history of contractures and had plan by PCP to  try on baclofen due to stroke evaluation being done at that time. 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  Palliative care consulted.  They had a conversation with patient and patient's sister about long-term goals and and they would like to optimize pain management and suggested outpatient palliative care follow-up upon discharge. -- Hold baclofen --Appreciate palliative care recommendations --Patient is DNR  Severe protein calorie malnutrition Most likely due to chronic illness, wound healing.  BMI is 15.33 kg/mm -- Nutrition supplement Juven twice daily  H/O hypertension Home patient is on lisinopril-HCTZ 20-25 mg and metoprolol 200 mg twice daily. -Hold home medications for now due to hypotension  H/o prostate cancer --Patient was previously following with urology, last follow-up ~5 years ago.    FEN/GI: NPO PPx:SCD's   Status is: Inpatient  Remains inpatient appropriate because:Inpatient level of care appropriate due to severity of illness   Dispo: The patient is from: Home              Anticipated d/c is to: LTAC              Patient currently is not medically stable to d/c.   Difficult to place patient No   Subjective:  Patient reports that he feels okay and denies any nausea but during the conversation he throws up his entire breakfast.  He denies any pain anywhere.  Reports that he does not feel the urge to urinate although he has been requiring straight caths throughout the night.  Objective: Temp:  [97 F (36.1 C)-97.8 F (36.6 C)] 97.6 F (36.4 C) (05/07 0400) Pulse Rate:  [55-80] 55 (05/07 0500) Resp:  [14-20] 16 (05/07 0500) BP: (87-107)/(34-60) 98/41 (05/07 0500) SpO2:  [97 %-100 %] 100 % (05/07 0500) Weight:  [38.1 kg] 38.1 kg (05/07 0400) Physical Exam: Unable to do full physical exam General: Patient lying curled up on right side in bed, NAD Respiratory: On nonrebreather mask 15 L/min Extremities: Right foot wound open, black  ischemic eschar ~10 cm *10 cm Psych: Normal mood and affect  Laboratory: Recent Labs  Lab 07/21/20 0436 07/22/20 0605 07/23/20 0157  WBC 19.2* 13.2* 9.6  HGB 13.1 10.3* 9.1*  HCT 38.8* 31.5* 27.2*  PLT PLATELET CLUMPS NOTED ON SMEAR, COUNT APPEARS DECREASED 127* 114*   Recent Labs  Lab 07/21/20 0436 07/21/20 2319 07/22/20 0605 07/23/20 0157  NA 137 137 138 137  K 3.6 3.6 3.1* 3.6  CL 103 105 107 105  CO2 23 24 25  21*  BUN 14 24* 19 11  CREATININE 0.60* 0.69 0.69 0.51*  CALCIUM 8.2* 8.3* 8.0* 7.4*  PROT 5.5*  --  4.5* 4.6*  BILITOT 1.3*  --  0.8 0.7  ALKPHOS 114  --  98 77  ALT 82*  --  105* 66*  AST 109*  --  114* 101*  GLUCOSE 148* 145* 141* 80    Imaging/Diagnostic Tests: DG Abd Portable 1V  Result Date: 07/22/2020 CLINICAL DATA:  Abdominal distension. EXAM: PORTABLE ABDOMEN - 1 VIEW COMPARISON:  Jul 21, 2020 FINDINGS: Air-filled loops of large and small bowel are seen without definite evidence of bowel dilatation. A moderate amount of stool is noted within the ascending colon. No radio-opaque calculi are seen. Multiple prostate radiation implantation seeds are noted. IMPRESSION: Nonspecific bowel gas pattern  without definite evidence of bowel obstruction. Electronically Signed   By: Virgina Norfolk M.D.   On: 07/22/2020 20:51    Gifford Shave, MD 07/23/2020, 7:16 AM PGY-2, [place] Intern pager: [phone], text pages welcome

## 2020-07-23 NOTE — Anesthesia Postprocedure Evaluation (Signed)
Anesthesia Post Note  Patient: Rick Schwartz  Procedure(s) Performed: RIGHT ABOVE KNEE AMPUTATION (Right Knee)     Patient location during evaluation: PACU Anesthesia Type: Regional Level of consciousness: awake Pain management: pain level controlled Vital Signs Assessment: post-procedure vital signs reviewed and stable Respiratory status: spontaneous breathing, nonlabored ventilation, respiratory function stable and patient connected to nasal cannula oxygen Cardiovascular status: stable and blood pressure returned to baseline Postop Assessment: no apparent nausea or vomiting Anesthetic complications: no   No complications documented.  Last Vitals:  Vitals:   07/23/20 0400 07/23/20 0500  BP: (!) 96/46 (!) 98/41  Pulse:  (!) 55  Resp: 14 16  Temp: 36.4 C   SpO2: 100% 100%    Last Pain:  Vitals:   07/23/20 0400  TempSrc: Oral  PainSc:                  Daci Stubbe P Rodrigues Urbanek

## 2020-07-23 NOTE — Progress Notes (Signed)
Patient able to void with encouragement and slight pressure on his abdomen.  PVR scan was "0" x 3.  Amt of void = 175 of amber urine.  Pt vomited large amt earlier just after breakfast and having taken his medications.  Since then, he has refused to take po, stating he doesn't want to get sick again.  Suggest plan to assist with voiding every 4 hours as needed.

## 2020-07-23 NOTE — Progress Notes (Signed)
Patient ID: Rick Schwartz, male   DOB: Jun 10, 1940, 80 y.o.   MRN: [id number] Patient appears stable this morning.  There is no drainage in the wound VAC canister his white blood cell has dropped from a high of 23.8 currently 9.6.  His hemoglobin is also dropped it is now 9.1.  Anticipate patient would be safe for discharge to a skilled nursing facility.  Do not feel that patient's home environment would be able to provide him the needed care.

## 2020-07-23 NOTE — Evaluation (Signed)
Physical Therapy Evaluation Patient Details Name: Rick Schwartz MRN: [id number] DOB: October 12, 1940 Today's Date: 07/23/2020   History of Present Illness  80 y.o. male presents to [facility] on 07/17/2020 with R foot ulcer, transferred to [facility] hospital for ortho workup. Pt septic due to R foot infection. Pt is nonverbal at baseline, also with history of cerebral palsy. Concern for aspiration event on 5/5 2/2 emesis. 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  Clinical Impression  Pt will benefit from easy call bell to communicate needs with hospital staff, when necessary.  Pt tolerates bed mobility, requiring some assistance. Pt demonstrates some expressive difficulties, with PT calling sister for information regarding home living and prior level of function. Pt requires assistance with all bed mobility, R> L. Pt demonstrates functional L knee extension, L elbow flexion and L grip strength. Pt demonstrates R UE flexion synergy, limiting functional use. Pt demonstrates deficits in range of motion, strength, endurance, power, activity tolerance, and will benefit from acute PT to improve his independence in bed mobility and transfers. Pt currently lives in an inaccessible home with 4 steps to enter and no ramp. Pt will benefit from ramp placement to decrease caregiver burden and improve ease of accessing home. Pt will also benefit from a lift and hospital bed to assist with transfers and pressure relief. SPT recommends SNF placement to improve functional mobility and address safety concerns that are unavoidable in the home. The pt currently requires some physical assistance with functional mobility and will benefit from strengthening and transfer training for improved independence in the home.     Follow Up Recommendations SNF    Equipment Recommendations  Other (comment);Hospital bed (ramp, mechanical lift)    Recommendations for Other Services       Precautions / Restrictions Precautions Precautions:  Fall;Other (comment) (wound vac) Restrictions Weight Bearing Restrictions: Yes RLE Weight Bearing: Non weight bearing      Mobility  Bed Mobility Overal bed mobility: Needs Assistance Bed Mobility: Rolling;Sidelying to Sit;Supine to Sit Rolling: Mod assist;Min assist (mod A to roll to R. min A to roll to L.) Sidelying to sit: Max assist Supine to sit: Max assist     General bed mobility comments: Pt demonstrates ability to sit at EOB with assistance. When asked to maintain upright posture in sitting without support, pt fatigues and reclines posteriorly.    Transfers Overall transfer level:  (Simultaneous filing. User may not have seen previous data.)               General transfer comment: Not performed secondary to uncertainty regarding ability to transfer prior to admission and safety concerns.  Ambulation/Gait                Stairs            Wheelchair Mobility    Modified Rankin (Stroke Patients Only)       Balance Overall balance assessment: Needs assistance Sitting-balance support: Feet unsupported;No upper extremity supported Sitting balance-Leahy Scale: Poor Sitting balance - Comments: When pt leaning on L UE, still leaning posteriorly. Pt requires mod A to maintain upright posture or min A with HHA.                                     Pertinent Vitals/Pain Pain Assessment: No/denies pain    Home Living Family/patient expects to be discharged to:: Skilled nursing facility Living Arrangements: Alone  Additional Comments: Prior to hospitalization pt lived alone with 24/7 caregiver support.    Prior Function Level of Independence: Needs assistance   Gait / Transfers Assistance Needed: Pt requires assistance for w/c transfers and bed mobility, recently performing lateral scoot transfers but in the past performing squat pivot transfers per sister. Pt is dependent for wheelchair mobility.  ADL's /  Homemaking Assistance Needed: Pt requires assistance for all ADLs  Comments: PTA pt was able to assist with rolling in bed and transfers with caregiver support.     Hand Dominance        Extremity/Trunk Assessment   Upper Extremity Assessment Upper Extremity Assessment: RUE deficits/detail;LUE deficits/detail RUE Deficits / Details: Pt demonstrates flexion syngery. Pt demonstrates abotu 10 degrees of elbow extension due to contractures. Pt was able to abduct shoulder about 20 degrees actively. Pt was unable to move fingers. RUE: Unable to fully assess due to immobilization RUE Coordination: decreased fine motor;decreased gross motor LUE Deficits / Details: Pt demonstrates AAROM shoulder flexion to ~ 120 degrees.  Pt able to actively pull SPTs hand toward him demonstrating fair elbow flexion strength. Elbow extension AROM to neutral.    Lower Extremity Assessment Lower Extremity Assessment: LLE deficits/detail;RLE deficits/detail RLE Deficits / Details: Pt unable to fully extend at hip to neutral with cues and during PROM attempt. Pt able to actively extend after flexing hip. Pt to perform hip flexion ~50 degrees. RLE Coordination: decreased fine motor;decreased gross motor LLE Deficits / Details: Pt demonstrates decreased ankle DF PROM to neutral and PF PROM to ~ 5 degrees. Pt demonstrates full knee extension, limited to about 90 degrees in knee flexion and hip flexion. Pt able to extend against gravity but unable to perform SLR to lift heel off of bed. LLE Coordination: decreased fine motor;decreased gross motor    Cervical / Trunk Assessment Cervical / Trunk Assessment: Kyphotic  Communication   Communication: Expressive difficulties  Cognition Arousal/Alertness: Awake/alert Behavior During Therapy: WFL for tasks assessed/performed Overall Cognitive Status: No family/caregiver present to determine baseline cognitive functioning                                  General Comments: Pt is oriented to person and place. Pt follows commands well.      General Comments General comments (skin integrity, edema, etc.): Sister called for more information regarding patient's prior level of function. Sister reports pt able to provide some assistance along with assistance from caregiver to complete bed mobility and w/c transfers.    Exercises     Assessment/Plan    PT Assessment Patient needs continued PT services  PT Problem List Decreased strength;Decreased range of motion;Decreased activity tolerance;Decreased balance;Decreased mobility;Decreased coordination;Impaired tone       PT Treatment Interventions DME instruction;Functional mobility training;Therapeutic activities;Therapeutic exercise;Balance training;Patient/family education;Wheelchair mobility training    PT Goals (Current goals can be found in the Care Plan section)  Acute Rehab PT Goals Patient Stated Goal: Pt unable to state, PT goal is perform bed mobility with min A. PT Goal Formulation: Patient unable to participate in goal setting Time For Goal Achievement: 08/06/20 Potential to Achieve Goals: Fair    Frequency Min 2X/week   Barriers to discharge Inaccessible home environment;Decreased caregiver support No ramp to access home. No hospital bed to address issues regarding pressure injuries. No mechanical lift to decrease caregiver burden and improve ease of transfers.    Co-evaluation  AM-PAC PT "6 Clicks" Mobility  Outcome Measure Help needed turning from your back to your side while in a flat bed without using bedrails?: A Lot Help needed moving from lying on your back to sitting on the side of a flat bed without using bedrails?: A Lot Help needed moving to and from a bed to a chair (including a wheelchair)?: A Lot Help needed standing up from a chair using your arms (e.g., wheelchair or bedside chair)?: Total Help needed to walk in hospital room?:  Total Help needed climbing 3-5 steps with a railing? : Total 6 Click Score: 9    End of Session   Activity Tolerance: Patient tolerated treatment well Patient left: in bed;with call bell/phone within reach (bed alarm off upon arrival.) Nurse Communication: Mobility status PT Visit Diagnosis: Muscle weakness (generalized) (M62.81);Unsteadiness on feet (R26.81);Other abnormalities of gait and mobility (R26.89);Hemiplegia and hemiparesis    Time: 1610-1640 PT Time Calculation (min) (ACUTE ONLY): 30 min   Charges:   PT Evaluation $PT Eval Low Complexity: 1 Low PT Treatments $Therapeutic Activity: 8-22 mins        Acute Rehab  Pager: [phone]   Garwin Brothers, SPT  07/23/2020, 6:26 PM

## 2020-07-23 NOTE — Progress Notes (Signed)
OT Cancellation Note  Patient Details Name: MANJOT HINKS MRN: [id number] DOB: 05-11-1940   Cancelled Treatment:    Reason Eval/Treat Not Completed: Medical issues which prohibited therapy. Hypotensive, consulting with palliative medicine to determine a POC.   [place] 07/23/2020, 11:30 AM  Jesse Sans OTR/L Acute Rehabilitation Services Pager: [phone] Office: [phone]

## 2020-07-23 NOTE — Progress Notes (Addendum)
Pt vomited large amt semi-digested food and medications.  He denied nausea and stated it was "sudden".  Complete bath given x 2 assists.  MD present.  It was noted that LBM > 72 hours ago.  Appears to be passing flatus.  Abdomen not tender to palpation / slightly firm.  Patient able to void on own when encouraged.  Slight pressure applied to abdomen as well.  Pt voided amber urine = 275 ml.

## 2020-07-24 ENCOUNTER — Inpatient Hospital Stay (HOSPITAL_COMMUNITY): Payer: Medicare Other

## 2020-07-24 ENCOUNTER — Encounter (HOSPITAL_COMMUNITY): Payer: Self-pay | Admitting: Orthopedic Surgery

## 2020-07-24 DIAGNOSIS — R64 Cachexia: Secondary | ICD-10-CM | POA: Diagnosis not present

## 2020-07-24 DIAGNOSIS — I10 Essential (primary) hypertension: Secondary | ICD-10-CM | POA: Diagnosis not present

## 2020-07-24 DIAGNOSIS — L97513 Non-pressure chronic ulcer of other part of right foot with necrosis of muscle: Secondary | ICD-10-CM | POA: Diagnosis not present

## 2020-07-24 DIAGNOSIS — K56609 Unspecified intestinal obstruction, unspecified as to partial versus complete obstruction: Secondary | ICD-10-CM | POA: Diagnosis not present

## 2020-07-24 LAB — GLUCOSE, CAPILLARY: Glucose-Capillary: 87 mg/dL (ref 70–99)

## 2020-07-24 LAB — CBC WITH DIFFERENTIAL/PLATELET
Abs Immature Granulocytes: 0.07 10*3/uL (ref 0.00–0.07)
Basophils Absolute: 0 10*3/uL (ref 0.0–0.1)
Basophils Relative: 0 %
Eosinophils Absolute: 0.1 10*3/uL (ref 0.0–0.5)
Eosinophils Relative: 1 %
HCT: 36.7 % — ABNORMAL LOW (ref 39.0–52.0)
Hemoglobin: 11.2 g/dL — ABNORMAL LOW (ref 13.0–17.0)
Immature Granulocytes: 1 %
Lymphocytes Relative: 5 %
Lymphs Abs: 0.5 10*3/uL — ABNORMAL LOW (ref 0.7–4.0)
MCH: 30.9 pg (ref 26.0–34.0)
MCHC: 30.5 g/dL (ref 30.0–36.0)
MCV: 101.4 fL — ABNORMAL HIGH (ref 80.0–100.0)
Monocytes Absolute: 0.4 10*3/uL (ref 0.1–1.0)
Monocytes Relative: 4 %
Neutro Abs: 9.7 10*3/uL — ABNORMAL HIGH (ref 1.7–7.7)
Neutrophils Relative %: 89 %
Platelets: 129 10*3/uL — ABNORMAL LOW (ref 150–400)
RBC: 3.62 MIL/uL — ABNORMAL LOW (ref 4.22–5.81)
RDW: 13.7 % (ref 11.5–15.5)
WBC: 10.8 10*3/uL — ABNORMAL HIGH (ref 4.0–10.5)
nRBC: 0 % (ref 0.0–0.2)

## 2020-07-24 LAB — BASIC METABOLIC PANEL
Anion gap: 8 (ref 5–15)
BUN: 9 mg/dL (ref 8–23)
CO2: 20 mmol/L — ABNORMAL LOW (ref 22–32)
Calcium: 7.4 mg/dL — ABNORMAL LOW (ref 8.9–10.3)
Chloride: 110 mmol/L (ref 98–111)
Creatinine, Ser: 0.49 mg/dL — ABNORMAL LOW (ref 0.61–1.24)
GFR, Estimated: >60 mL/min (ref >60)
Glucose, Bld: 48 mg/dL — ABNORMAL LOW (ref 70–99)
Potassium: 4.2 mmol/L (ref 3.5–5.1)
Sodium: 138 mmol/L (ref 135–145)

## 2020-07-24 MED ORDER — DEXTROSE-NACL 5-0.9 % IV SOLN: INTRAVENOUS | Status: DC

## 2020-07-24 NOTE — Progress Notes (Addendum)
Patient's QTC was 575 on cardiac monitoring, then decreased below 500. Patient is asymptomatic. No signs of distress noted at this time. RN notified Dr. Susa Simmonds of this.   07/24/2020 05:45: Also paged on-call intern at [phone] regarding this.  07/24/2020 05:54: On-call intern called back, and RN notified intern of this also.

## 2020-07-24 NOTE — Progress Notes (Signed)
Family Medicine Teaching Service Daily Progress Note Intern Pager: [phone]  Patient name: Rick Schwartz Medical record number: [id number] Date of birth: 05-May-1940 Age: 80 y.o. Gender: male  Primary Care Provider: Matilde Haymaker, MD Consultants: Orthopedics, Palliative Code Status: DNR  Pt Overview and Major Events to Date:  05/01: Admitted to [facility] 05/05: Rapid Response Called for Sepsis 05/06: Transfer of care to [place] 05/06: AKA surgery  Assessment and Plan: 80 year old male admitted on 5/1 for gangrene of foot and severe sepsis from infected foot ulcer.  PMH significant for CP, PAD, HTN, cerebral palsy.   AKA s/p Severe sepsis  2/2 gangrene of right forefoot with nonhealing foot ulcer  PAD POD#2 AKA.  Wound vac in place with minimal drainage.  Episode of hypothermia overnight.  Slight increase in WBC this am.   -Ortho following, appreciate recommendations -Continue IV Zosyn and Vancomycin -PT/OT following -Monitor fever curve -CBC in am  Acute hypoxic respiratory failure may be 2/2 sepsis or ?  Pneumonia Continues to require oxygen support, 3L N/C.  Coarse crackles noted over entire lung fields.  Unable to take in deep breaths.  Denies any shortness of breath or difficulty breathing and no complaints of chest pain.  Left upper extremity edematous and trace left lower extremity edema noted.  -Repeat chest xray today -Decrease IVF to 50cc/hh -Speech following, dysphagia 2 diet -Assist with meals -Turn q2h -Aspiration precautions  Goals of care/History of cerebral palsy Right leg contracture Interactive and verbal with short answers to questions.  Palliative spoke with family. Plan to continue current treatment however if condition declines do not escalate treatment and will switch to comfort care measures and outpatient palliative care follow up on discharge. -Palliative care following, will see again in am -Currently DNR status. -Update family  accordingly  Urinary retention Incontinent of urine.  Bladder scan shows 10-11 cc in last 24 hrs -Continue to monitor  ?  Ileus Abdominal exam normal. Passing gas. No evidence of obstruction on repeat imaging  -Continue to monitor -If worsening may need to reimage  Bowel dilatation without air-fluid levels on abdominal x-ray on 5/5.  Repeat KUB on 5/6 showed nonspecific bowel gas pattern without definitive evidence of bowel obstruction. -Continue to monitor - consider further imaging if indicated  Left upper extremity edema Venous duplex negative for DVT Continue to monitor  Right subclavian vein DVT Continue to monitor.  Venous duplex shows acute DVT. -Left message with Dr Jeanett Schlein today.  Question for him is if we were wanting to restart anticoagulation, from a surgical standpoint would this be ok.  Transaminitis likely 2/2 sepsis Improving.   -Monitor LFT's  -Avoid hepatotoxic agents  Electrolyte derangements  Continue to monitor  Severe protein calorie malnutrition In the setting of chronic illness.  Albumin 1.3 -Continue Juven BID to help promote wound healing   H/O hypertension Now hypotensive.   -Continue to hold home medications  H/o prostate cancer Stable    FEN/GI: NPO PPx:SCD's   Status is: Inpatient  Remains inpatient appropriate because:Inpatient level of care appropriate due to severity of illness   Dispo: The patient is from: Home              Anticipated d/c is to: LTAC              Patient currently is not medically stable to d/c.   Difficult to place patient No  Subjective:  No acute events overnight. Speech is slow but able to communicate appropriately.  Denies any  worsening   Objective: Temp:  [97.4 F (36.3 C)-97.7 F (36.5 C)] 97.4 F (36.3 C) (05/08 0700) Pulse Rate:  [54-96] 96 (05/08 0700) Resp:  [14-24] 20 (05/08 0700) BP: (88-130)/(36-97) 96/55 (05/08 0700) SpO2:  [93 %-100 %] 100 % (05/08 0700) Weight:  [51.7  kg] 51.7 kg (05/08 0518)   Physical Exam:  General: 80 y.o. male in NAD Cardio: RRR no m/r/g Lungs: Coarse crackles over right lung fields, decreased on left, no IWOB on 3 L n/c Abdomen: Soft, non-tender to palpation, non-distended, positive bowel sounds Skin: warm and dry Extremities: LUE edema, distal pulse present. Right BKA.   Laboratory: Recent Labs  Lab 07/22/20 0605 07/23/20 0157 07/24/20 0201  WBC 13.2* 9.6 10.8*  HGB 10.3* 9.1* 11.2*  HCT 31.5* 27.2* 36.7*  PLT 127* 114* 129*   Recent Labs  Lab 07/21/20 0436 07/21/20 2319 07/22/20 0605 07/23/20 0157 07/24/20 0201  NA 137   < > 138 137 138  K 3.6   < > 3.1* 3.6 4.2  CL 103   < > 107 105 110  CO2 23   < > 25 21* 20*  BUN 14   < > 19 11 9   CREATININE 0.60*   < > 0.69 0.51* 0.49*  CALCIUM 8.2*   < > 8.0* 7.4* 7.4*  PROT 5.5*  --  4.5* 4.6*  --   BILITOT 1.3*  --  0.8 0.7  --   ALKPHOS 114  --  98 77  --   ALT 82*  --  105* 66*  --   AST 109*  --  114* 101*  --   GLUCOSE 148*   < > 141* 80 48*   < > = values in this interval not displayed.      Imaging/Diagnostic Tests: No results found.  Carollee Leitz, MD 07/24/2020, 7:49 AM PGY-2, [facility] Intern pager: [phone], text pages welcome

## 2020-07-24 NOTE — Progress Notes (Signed)
   Palliative Medicine Inpatient Follow Up Note  Reason for consult:  Goals of Care "Pt clinically deteriorating, today having respiratory distress and trouble with clearing his secretions, possibly due to worsened infection, but prognosis is guarded."  HPI:  Per intake H&P --> Rick Manner L Thompsonis a 80 y.o.malewith medical history significant forHTN, PAD,cerebral palsy, non-verbal at baseline. Admitted for severe sepsis d/t right foot gangrene. Plan for surgical amputation.  Overnight, Rick Schwartz had worsening symptoms of sepsis. Palliative care was consulted to further address goals of care with patients sisters, Rick Schwartz and Rick Schwartz.  Today's Discussion (07/24/2020):  *Please note that this is a verbal dictation therefore any spelling or grammatical errors are due to the "[place] One" system interpretation.  Chart reviewed. I met with nursing staff at bedside. Per our conversation, Rick Schwartz has tolerated very little PO and is not producing much urine thought to be r/t dehydration from sepsis. He had x2 emesis episodes yesterday.    Rick Schwartz was awake and alert. He did not appear to be in any distress this morning, he does not endorse any pain or dyspnea.   Questions and concerns addressed   Objective Assessment: Vital Signs Vitals:   07/24/20 0335 07/24/20 0700  BP: (!) 98/43 (!) 96/55  Pulse: 87 96  Resp: 20 20  Temp: 97.7 F (36.5 C) (!) 97.4 F (36.3 C)  SpO2: 100% 100%    Intake/Output Summary (Last 24 hours) at 07/24/2020 0948 Last data filed at 07/24/2020 0846 Gross per 24 hour  Intake 547.6 ml  Output 450 ml  Net 97.6 ml   Last Weight  Most recent update: 07/24/2020  5:20 AM   Weight  51.7 kg (114 lb)           Gen:  Very ill appearing elderly AA M HEENT: Dry mucous membranes CV: Regular rate and rhythm  PULM: 3LPM [place] ABD: soft/nontender  EXT: RLE AKA dressing on  Neuro: Opens eyes and responds to name aware of situation  SUMMARY OF RECOMMENDATIONS DNAR/DNI  MOST  Completed, paper copy placed onto the chart electric copy can be found in [place]  DNR Form Completed, paper copy placed onto the chart electric copy can be found in [place]  [name]  [name]  Please reach out to PMT if additional conversations are needed  Time Spent: 25 Greater than 50% of the time was spent in counseling and coordination of care ______________________________________________________________________________________ [place] Team Team Cell Phone: [phone] Please utilize secure chat with additional questions, if there is no response within 30 minutes please call the above phone number  Palliative Medicine Team providers are available by phone from 7am to 7pm daily and can be reached through the team cell phone.  Should this patient require assistance outside of these hours, please call the patient's attending physician.

## 2020-07-24 NOTE — Progress Notes (Addendum)
FPTS Interim Progress Note  S: Patient seen for PM check given watcher status.  He is resting comfortably, watching TV.  Denies complaints.  O: BP 96/62 (BP Location: Right Wrist)   Pulse 100   Temp 97.8 F (36.6 C)   Resp (!) 24   Ht 5\' 6"  (1.676 m)   Wt 51.7 kg Comment: removed wound vac and SCD machine from bed, prior to bed scale weight, then placed them back on.  SpO2 100%   BMI 18.40 kg/m    Physical Exam:  General: 80 y.o. male in NAD Cardio: RRR no m/r/g Lungs: coarse crackles throughout all lung fields, good air movement throughout, breathing comfortably on 3L per [place] Skin: warm and dry Extremities: s/p right AKA, wound vac in place   A/P: BP currently stable No acute distress Continue with current treatment plan  Cleophas Dunker, DO 07/24/2020, 9:11 PM PGY-3, [facility] pager [phone]

## 2020-07-24 NOTE — NC FL2 (Signed)
[place] LEVEL OF CARE SCREENING TOOL     IDENTIFICATION  Patient Name: Rick Schwartz Birthdate: 1940/05/22 Sex: male Admission Date (Current Location): 07/17/2020  [facility] and [place] Number:  Herbalist and Address:  [facility] [place], [place] [address] [place],  [address]      Provider Number: [id number]  Attending Physician Name and Address:  Zenia Resides, MD  Relative Name and Phone Number:       Current Level of Care: Hospital Recommended Level of Care: [place] Prior Approval Number:    Date Approved/Denied: 07/24/20 PASRR Number: [id number]  Discharge Plan: Home    Current Diagnoses: Patient Active Problem List   Diagnosis Date Noted  . SBO (small bowel obstruction) ([place])   . Inanition ([place])   . Foot ulcer with necrosis of muscle, right ([place]) 07/18/2020  . Severe sepsis ([place]) 07/18/2020  . Lactic acidosis 07/18/2020  . Hypernatremia 07/18/2020  . Hypercalcemia 07/18/2020  . Severe protein-calorie malnutrition ([place])   . Gangrene of right foot ([place]) 07/17/2020  . Focal motor deficit 07/07/2020  . Physical deconditioning 05/06/2020  . Prediabetes 07/20/2016  . 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 GLUCOSE INTOLERANCE 05/16/2006  . HYPERCHOLESTEROLEMIA 05/16/2006  . Infantile cerebral palsy ([place]) 05/16/2006  . HYPERTENSION, BENIGN SYSTEMIC 05/16/2006  . BPH 05/16/2006    Orientation RESPIRATION BLADDER Height & Weight     Place,Self  O2 (3L nasual cannula) Incontinent Weight: 114 lb (51.7 kg) (removed wound vac and SCD machine from bed, prior to bed scale weight, then placed them back on.) Height:  5\' 6"  (167.6 cm)  BEHAVIORAL SYMPTOMS/MOOD NEUROLOGICAL BOWEL NUTRITION STATUS      Incontinent Diet (DYS 2)  AMBULATORY STATUS COMMUNICATION OF NEEDS Skin   Extensive Assist Non-Verbally Wound Vac                       Personal Care Assistance Level  of Assistance  Bathing,Feeding,Dressing Bathing Assistance: Maximum assistance Feeding assistance: Limited assistance Dressing Assistance: Maximum assistance     Functional Limitations Info             SPECIAL CARE FACTORS FREQUENCY  PT (By licensed PT),OT (By licensed OT)     PT Frequency: 5x weekly OT Frequency: 5x weekly            Contractures Contractures Info: Present    Additional Factors Info  Code Status,Allergies Code Status Info: DNR Allergies Info: NKDA           Current Medications (07/24/2020):  This is the current hospital active medication list Current Facility-Administered Medications  Medication Dose Route Frequency Provider Last Rate Last Admin  . ascorbic acid (VITAMIN C) tablet 1,000 mg  1,000 mg Oral Daily Persons, Bevely Palmer, PA   1,000 mg at 07/24/20 0932  . dextrose 5 %-0.9 % sodium chloride infusion   Intravenous Continuous Carollee Leitz, MD 50 mL/hr at 07/24/20 1316 New Bag at 07/24/20 1316  . Gerhardt's butt cream   Topical TID Zenia Resides, MD   Given at 07/24/20 1155  . guaiFENesin-dextromethorphan (ROBITUSSIN DM) 100-10 MG/5ML syrup 15 mL  15 mL Oral Q4H PRN Persons, Bevely Palmer, PA      . methocarbamol (ROBAXIN) 500 mg in dextrose 5 % 50 mL IVPB  500 mg Intravenous Q8H PRN Persons, Bevely Palmer, PA      . nutrition supplement (JUVEN) (JUVEN) powder packet 1 packet  1 packet Oral BID BM Persons, Bevely Palmer, [place]   1 packet at 07/24/20 1416  . pantoprazole (PROTONIX) injection 40 mg  40 mg Intravenous Q12H Persons, Bevely Palmer, PA   40 mg at 07/24/20 0940  . phenol (CHLORASEPTIC) mouth spray 1 spray  1 spray Mouth/Throat PRN Persons, Bevely Palmer, PA      . piperacillin-tazobactam (ZOSYN) IVPB 3.375 g  3.375 g Intravenous Q8H Persons, Bevely Palmer, PA 12.5 mL/hr at 07/24/20 1203 3.375 g at 07/24/20 1203  . zinc sulfate capsule 220 mg  220 mg Oral Daily Persons, Bevely Palmer, [place]   220 mg at 07/24/20 2979     Discharge Medications: Please see discharge  summary for a list of discharge medications.  Relevant Imaging Results:  Relevant Lab Results:   Additional Information SSN: [id number] COVID vaccines: 02/08/2020, 06/10/2019, 05/18/2019  Oretha Milch, LCSW

## 2020-07-24 NOTE — Progress Notes (Signed)
Pharmacy Antibiotic Note  Rick Schwartz is a 80 y.o. male admitted on 07/17/2020 with wound infection. Pharmacy has been consulted for Zosyn dosing. SCr stable. 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  5/1 Foot Xray: Soft tissue infection + gas; no evidence of acute osteomyelitis.  Plan: Zosyn 3.375g IV q8h (4h infusion) Monitor clinical status, renal function, LOT   Height: 5\' 6"  (167.6 cm) Weight: 51.7 kg (114 lb) (removed wound vac and SCD machine from bed, prior to bed scale weight, then placed them back on.) IBW/kg (Calculated) : 63.8  Temp (24hrs), Avg:97.7 F (36.5 C), Min:97.4 F (36.3 C), Max:98.1 F (36.7 C)  Recent Labs  Lab 07/20/20 0031 07/20/20 1756 07/21/20 0436 07/21/20 1300 07/21/20 2132 07/21/20 2319 07/22/20 0605 07/22/20 1222 07/23/20 0157 07/23/20 0308 07/24/20 0201  WBC 13.2*  --  19.2*  --   --   --  13.2*  --  9.6  --  10.8*  CREATININE 0.63  --  0.60*  --   --  0.69 0.69  --  0.51*  --  0.49*  LATICACIDVEN  --    < >  --    < > 1.7 3.4*  --  2.3* 1.0 1.7  --    < > = values in this interval not displayed.    Estimated Creatinine Clearance: 54.8 mL/min (A) (by C-G formula based on SCr of 0.49 mg/dL (L)).    No Known Allergies   Romilda Garret, PharmD PGY1 Acute Care Pharmacy Resident 07/24/2020 2:32 PM  Please check [email or web address] for unit specific pharmacy phone numbers.

## 2020-07-24 NOTE — TOC Initial Note (Signed)
Transition of Care [facility]) - Initial/Assessment Note    Patient Details  Name: Rick Schwartz MRN: [id number] Date of Birth: Mar 21, 1940  Transition of [facility]) CM/SW Contact:    Bary Castilla, LCSW Phone Number: [phone] 07/24/2020, 4:08 PM  Clinical Narrative:    CSW spoke with pt's [place] to discuss PT recommendation of a SNF due to pt's orientation.Mardene Celeste was in agreement with going to a ST SNF due to her stating that the ultimate plan would be for pt to stay with her. Mardene Celeste informed CSW that pt has not been to a SNF in the past. Patient is from home alone and has someone that would come in the evening to assist with his care. CSW discussed the SNF process.CSW provided patient with medicare.gov rating list website.  Patient gave CSW permission to fax referrals out to local facilities. CSW answered questions about the SNF process and the next steps in the process. Patient has been vaccinated and had one booster.  TOC team will continue to assist with discharge planning needs.                    Expected Discharge Plan: Skilled Nursing Facility Barriers to Discharge: Continued Medical Work up,Insurance Authorization,SNF Pending bed offer   Patient Goals and CMS Choice   CMS Medicare.gov Compare Post Acute Care list provided to:: Patient Represenative (must comment) ([name]) Choice offered to / list presented to : Sibling ([name])  Expected Discharge Plan and Services Expected Discharge Plan: [place]       Living arrangements for the past 2 months: Apartment                                      Prior Living Arrangements/Services Living arrangements for the past 2 months: Apartment Lives with:: Self (Has someone to come over in the evenings to assist)            Care giver support system in place?: Yes (comment)      Activities of Daily Living Home Assistive Devices/Equipment: None ADL Screening  (condition at time of admission) Patient's cognitive ability adequate to safely complete daily activities?: No Is the patient deaf or have difficulty hearing?: No Does the patient have difficulty seeing, even when wearing glasses/contacts?: No Does the patient have difficulty concentrating, remembering, or making decisions?: No Patient able to express need for assistance with ADLs?: Yes Does the patient have difficulty dressing or bathing?: Yes Independently performs ADLs?: No Communication: Needs assistance Is this a change from baseline?: Pre-admission baseline Dressing (OT): Needs assistance Is this a change from baseline?: Pre-admission baseline Grooming: Dependent Is this a change from baseline?: Pre-admission baseline Feeding: Dependent Is this a change from baseline?: Pre-admission baseline Bathing: Dependent Is this a change from baseline?: Pre-admission baseline Toileting: Dependent Is this a change from baseline?: Pre-admission baseline In/Out Bed: Dependent Is this a change from baseline?: Pre-admission baseline Walks in Home: Dependent Is this a change from baseline?: Pre-admission baseline Does the patient have difficulty walking or climbing stairs?: Yes Weakness of Legs: Both Weakness of Arms/Hands: Both  Permission Sought/Granted      Share Information with NAME: Mardene Celeste  Permission granted to share info w AGENCY: SNFs  Permission granted to share info w Relationship: Sister  Permission granted to share info w Contact Information: [phone]  Emotional Assessment Appearance:: Appears stated  age Attitude/Demeanor/Rapport: Unable to Assess Affect (typically observed): Unable to Assess Orientation: : Oriented to Self,Oriented to Place      Admission diagnosis:  Necrotic toes [facility]) [I96] Patient Active Problem List   Diagnosis Date Noted  . SBO (small bowel obstruction) ([place])   . Inanition ([place])   . Foot ulcer with necrosis of muscle, right ([place])  07/18/2020  . Severe sepsis ([place]) 07/18/2020  . Lactic acidosis 07/18/2020  . Hypernatremia 07/18/2020  . Hypercalcemia 07/18/2020  . Severe protein-calorie malnutrition ([place])   . Gangrene of right foot ([place]) 07/17/2020  . Focal motor deficit 07/07/2020  . Physical deconditioning 05/06/2020  . Prediabetes 07/20/2016  . 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 GLUCOSE INTOLERANCE 05/16/2006  . HYPERCHOLESTEROLEMIA 05/16/2006  . Infantile cerebral palsy ([place]) 05/16/2006  . HYPERTENSION, BENIGN SYSTEMIC 05/16/2006  . BPH 05/16/2006   PCP:  Matilde Haymaker, MD Pharmacy:   [facility] [place], [place] - [address] [place] [address] Phone: [phone] Fax: [phone]     Social Determinants of Health (SDOH) Interventions    Readmission Risk Interventions No flowsheet data found.

## 2020-07-24 NOTE — Plan of Care (Signed)
  Problem: Safety: Goal: Ability to remain free from injury will improve Outcome: Progressing   Problem: Skin Integrity: Goal: Risk for impaired skin integrity will decrease Outcome: Progressing   

## 2020-07-25 LAB — CBC WITH DIFFERENTIAL/PLATELET
Abs Immature Granulocytes: 0.09 10*3/uL — ABNORMAL HIGH (ref 0.00–0.07)
Basophils Absolute: 0 10*3/uL (ref 0.0–0.1)
Basophils Relative: 0 %
Eosinophils Absolute: 0.1 10*3/uL (ref 0.0–0.5)
Eosinophils Relative: 1 %
HCT: 30.1 % — ABNORMAL LOW (ref 39.0–52.0)
Hemoglobin: 9.9 g/dL — ABNORMAL LOW (ref 13.0–17.0)
Immature Granulocytes: 1 %
Lymphocytes Relative: 4 %
Lymphs Abs: 0.6 10*3/uL — ABNORMAL LOW (ref 0.7–4.0)
MCH: 30.7 pg (ref 26.0–34.0)
MCHC: 32.9 g/dL (ref 30.0–36.0)
MCV: 93.2 fL (ref 80.0–100.0)
Monocytes Absolute: 0.4 10*3/uL (ref 0.1–1.0)
Monocytes Relative: 3 %
Neutro Abs: 11.7 10*3/uL — ABNORMAL HIGH (ref 1.7–7.7)
Neutrophils Relative %: 91 %
Platelets: 215 10*3/uL (ref 150–400)
RBC: 3.23 MIL/uL — ABNORMAL LOW (ref 4.22–5.81)
RDW: 13.6 % (ref 11.5–15.5)
WBC: 12.9 10*3/uL — ABNORMAL HIGH (ref 4.0–10.5)
nRBC: 0 % (ref 0.0–0.2)

## 2020-07-25 LAB — BASIC METABOLIC PANEL
Anion gap: 6 (ref 5–15)
BUN: 12 mg/dL (ref 8–23)
CO2: 23 mmol/L (ref 22–32)
Calcium: 7.6 mg/dL — ABNORMAL LOW (ref 8.9–10.3)
Chloride: 110 mmol/L (ref 98–111)
Creatinine, Ser: 0.47 mg/dL — ABNORMAL LOW (ref 0.61–1.24)
GFR, Estimated: >60 mL/min (ref >60)
Glucose, Bld: 91 mg/dL (ref 70–99)
Potassium: 3.1 mmol/L — ABNORMAL LOW (ref 3.5–5.1)
Sodium: 139 mmol/L (ref 135–145)

## 2020-07-25 LAB — SURGICAL PATHOLOGY

## 2020-07-25 MED ORDER — ADULT MULTIVITAMIN W/MINERALS CH
1.0000 | ORAL_TABLET | Freq: Every day | ORAL | Status: DC
  Administered 2020-07-26 – 2020-07-27 (×2): 1 via ORAL
  Filled 2020-07-25 (×3): qty 1

## 2020-07-25 MED ORDER — APIXABAN 5 MG PO TABS
5.0000 mg | ORAL_TABLET | Freq: Two times a day (BID) | ORAL | Status: DC
  Administered 2020-07-25 – 2020-07-27 (×4): 5 mg via ORAL
  Filled 2020-07-25 (×4): qty 1

## 2020-07-25 MED ORDER — PANTOPRAZOLE SODIUM 40 MG PO TBEC
40.0000 mg | DELAYED_RELEASE_TABLET | Freq: Every day | ORAL | Status: DC
  Administered 2020-07-25 – 2020-07-27 (×3): 40 mg via ORAL
  Filled 2020-07-25 (×3): qty 1

## 2020-07-25 MED ORDER — PROSOURCE PLUS PO LIQD
30.0000 mL | Freq: Three times a day (TID) | ORAL | Status: DC
  Administered 2020-07-26 – 2020-07-27 (×3): 30 mL via ORAL
  Filled 2020-07-25 (×4): qty 30

## 2020-07-25 MED ORDER — POTASSIUM CHLORIDE 20 MEQ PO PACK
40.0000 meq | PACK | Freq: Once | ORAL | Status: AC
  Administered 2020-07-25: 40 meq via ORAL
  Filled 2020-07-25: qty 2

## 2020-07-25 NOTE — Progress Notes (Addendum)
Discussed with Dr. Sharol Given with orthopedics regarding anticoagulation, he stated okay to start anticoagulation from surgical standpoint.  Placed order for apixaban 5 mg twice daily to start tonight, plan to transition to 2.5 mg twice daily after 7 days loading dose with the 5 mg dose.

## 2020-07-25 NOTE — Evaluation (Signed)
Occupational Therapy Evaluation Patient Details Name: Rick Schwartz MRN: [id number] DOB: 10/14/1940 Today's Date: 07/25/2020    History of Present Illness 80 y.o. male [facility] to [facility] 07/17/[facility] with R foot ulcer, transferred to [facility] hospital for ortho workup. Pt septic due to R foot infection. Pt is nonverbal at baseline, also with history of cerebral palsy. Concern for aspiration event on 5/5 2/2 emesis. 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   Clinical Impression   PTA, pt was living with at home with 24/7 caregivers who assisted with ADLs and transfers. Pt currently requiring Max-Total A for ADLs and bed mobility. Pt sitting at EOB with Max A for left lateral lean; HR elevating to 130-140s. Very agreeable and appreciative to sitting at EOB. Requiring Max A for drinking from cup (with and without straw). Pt would benefit from further acute OT to facilitate safe dc. Recommend dc to SNF for further OT to optimize safety, independence with ADLs, and return to PLOF.     Follow Up Recommendations  SNF    Equipment Recommendations  Other (comment) (Defer to next venue)    Recommendations for Other Services PT consult     Precautions / Restrictions Precautions Precautions: Fall;Other (comment) (wound vac) Restrictions Weight Bearing Restrictions: Yes RLE Weight Bearing: Non weight bearing      Mobility Bed Mobility Overal bed mobility: Needs Assistance Bed Mobility: Rolling;Sidelying to Sit;Supine to Sit Rolling: Mod assist;Min assist (mod A to roll to R. min A to roll to L.) Sidelying to sit: Max assist Supine to sit: Max assist     General bed mobility comments: Max A for managing BLES and bringing hips to EOB.    Transfers                 General transfer comment: Not performed secondary to uncertainty regarding ability to transfer prior to admission and safety concerns.    Balance Overall balance assessment: Needs assistance Sitting-balance support: Feet  unsupported;No upper extremity supported Sitting balance-Leahy Scale: Poor Sitting balance - Comments: Leaning L and posterioring. Max A for support                                   ADL either performed or assessed with clinical judgement   ADL Overall ADL's : Needs assistance/impaired   Eating/Feeding Details (indicate cue type and reason): Max A for bringing cup to mouth Grooming: Minimal assistance;Bed level Grooming Details (indicate cue type and reason): Wiping his mouth while supine in bed (HOB elevated)                               General ADL Comments: Max-Total A for ADLs.     Vision         Perception     Praxis      Pertinent Vitals/Pain Pain Assessment: No/denies pain     Hand Dominance Left   Extremity/Trunk Assessment Upper Extremity Assessment Upper Extremity Assessment: RUE deficits/detail RUE Deficits / Details: Pt demonstrates flexion syngery. Pt demonstrates about10 degrees of elbow extension due to contractures. Pt was able to abduct shoulder about 20 degrees actively. Pt was unable to move fingers. RUE Coordination: decreased fine motor;decreased gross motor LUE Deficits / Details: Pt demonstrates AAROM shoulder flexion to ~ 120 degrees.  Elbow extension AROM to neutral. weak but functional grasp   Lower Extremity Assessment  Lower Extremity Assessment: Defer to PT evaluation RLE Deficits / Details: Pt unable to fully extend at hip to neutral with cues and during PROM attempt. Pt able to actively extend after flexing hip. Pt to perform hip flexion ~50 degrees. RLE Coordination: decreased fine motor;decreased gross motor LLE Deficits / Details: Pt demonstrates decreased ankle DF PROM to neutral and PF PROM to ~ 5 degrees. Pt demonstrates full knee extension, limited to about 90 degrees in knee flexion and hip flexion. Pt able to extend against gravity but unable to perform SLR to lift heel off of bed. LLE Coordination:  decreased fine motor;decreased gross motor   Cervical / Trunk Assessment Cervical / Trunk Assessment: Kyphotic   Communication Communication Communication: Expressive difficulties   Cognition Arousal/Alertness: Awake/alert Behavior During Therapy: WFL for tasks assessed/performed Overall Cognitive Status: No family/caregiver present to determine baseline cognitive functioning                                 General Comments: Pt is oriented to person and place. Pt follows commands well. Agreeable to sit at EOB.   General Comments  HR eleating to 120s with drinking and 130-140s with sitting at EOB.    Exercises     Shoulder Instructions      Home Living Family/patient expects to be discharged to:: Skilled nursing facility Living Arrangements: Alone                               Additional Comments: Prior to hospitalization pt lived alone with 24/7 caregiver support.      Prior Functioning/Environment Level of Independence: Needs assistance  Gait / Transfers Assistance Needed: Pt requires assistance for w/c transfers and bed mobility, recently performing lateral scoot transfers but in the past performing squat pivot transfers per sister. Pt is dependent for wheelchair mobility. ADL's / Homemaking Assistance Needed: Pt requires assistance for all ADLs   Comments: PTA pt was able to assist with rolling in bed and transfers with caregiver support. Per chart review and partially from pt report        OT Problem List:        OT Treatment/Interventions: Self-care/ADL training;Therapeutic exercise;Energy conservation;DME and/or AE instruction;Therapeutic activities;Patient/family education    OT Goals(Current goals can be found in the care plan section) Acute Rehab OT Goals Patient Stated Goal: Pt agreeable to EOB with therapist OT Goal Formulation: With patient Time For Goal Achievement: 08/08/20 Potential to Achieve Goals: Good  OT Frequency: Min  2X/week   Barriers to D/C:            Co-evaluation              AM-PAC OT "6 Clicks" Daily Activity     Outcome Measure Help from another person eating meals?: A Lot Help from another person taking care of personal grooming?: A Lot Help from another person toileting, which includes using toliet, bedpan, or urinal?: Total Help from another person bathing (including washing, rinsing, drying)?: Total Help from another person to put on and taking off regular upper body clothing?: A Lot Help from another person to put on and taking off regular lower body clothing?: Total 6 Click Score: 9   End of Session Equipment Utilized During Treatment: Oxygen (3L) Nurse Communication: Mobility status  Activity Tolerance: Patient tolerated treatment well Patient left: in bed;with call bell/phone within reach  OT Visit  Diagnosis: Unsteadiness on feet (R26.81);Other abnormalities of gait and mobility (R26.89);Muscle weakness (generalized) (M62.81)                Time: 2563-8937 OT Time Calculation (min): 26 min Charges:  OT General Charges $OT Visit: 1 Visit OT Evaluation $OT Eval Moderate Complexity: 1 Mod OT Treatments $Self Care/Home Management : 8-22 mins  Bransyn Adami MSOT, OTR/L Acute Rehab Pager: [phone] Office: [place] 07/25/2020, 2:18 PM

## 2020-07-25 NOTE — Progress Notes (Signed)
Family Medicine Teaching Service Daily Progress Note Intern Pager: [phone]  Patient name: Rick Schwartz Medical record number: [id number] Date of birth: 10-28-1940 Age: 80 y.o. Gender: male  Primary Care Provider: Matilde Haymaker, MD Consultants: Orthopedics, palliative care   Code Status: DNR  Pt Overview and Major Events to Date:  05/01: Admitted to [facility] 05/05: Rapid Response Called for Sepsis 05/06: Transfer of care to [facility] 05/06: AKA surgery  Assessment and Plan: Rick Schwartz is a 80 y.o. 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 PMHx significant for CP, PAD, HTN.  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 BP improving, SBP 100s-110s. Afebrile since 5/5. Leukocytosis worsening, 12.9 this AM. Vancomycin discontinued. - orthopedics following, appreciate recommendations - IV piperacillin-tazobactam (5/5-) - s/p vancomycin (5/1-5/7) - s/p cefepime (5/1, 5/5) - s/p CTX (5/1-5/3)  Acute hypoxemic respiratory failure Due to presumed aspiration pneumonia noted 5/5 on CXR during rapid response from aspiration event. Iatrogenic volume overload possibly playing a role, IV fluids decreased yesterday. Appears euvolemic today, still on 3L [place] but breathing comfortably. - D5NS 20 cc/hr, consider discontinuing if PO intake improving - consider diuresis - aspiration precautions  Goals of care Poor prognosis given frailty and poor oral intake. Plan to continue current treatment however if condition declines do not escalate treatment and will switch to comfort care measures and outpatient palliative care follow up on discharge. - palliative care following, appreciate involvement - DNR - CMO if declining, will not transfer to ICU if indicated per family wishes  Right subclavian vein DVT Not being treated at this time given recent surgery. Will reach out to orthopedic surgery prior  to starting anticoagulation, plan to initiate lower dose apixaban per clinical judgment given age and low BMI.  Urinary retention Resolved. Has required straight cath x2 this admission.  Transaminitis Improving. - monitor on CMP  Severe protein calorie malnutrition In the setting of severe illness, most recent albumin 1.3. - Juven BID  HTN Has been hypotensive to soft BP.  Continue holding home antihypertensives.  FEN/GI: DYS 2, D5NS 20 mL/h PPx: SCDs  Disposition: progressive, SNF recommended when medically stable  Subjective:  NAOE.  Patient denies any pain this morning.  Denies any shortness of breath on oxygen.  Objective: Temp:  [97.4 F (36.3 C)-98.1 F (36.7 C)] 97.8 F (36.6 C) (05/09 0443) Pulse Rate:  [84-108] 84 (05/09 0443) Resp:  [19-24] 21 (05/09 0443) BP: (96-110)/(41-62) 110/53 (05/09 0443) SpO2:  [97 %-100 %] 100 % (05/09 0443) Weight:  [48.5 kg] 48.5 kg (05/09 0000) Physical Exam: General: Thin elderly male resting in bed comfortably, NAD Cardiovascular: RRR, no murmurs Respiratory: Transmitted upper airway wheeze, otherwise clear to auscultation bilaterally, breathing comfortably on 3 L nasal cannula Abdomen: Soft, nontender, positive bowel sounds Extremities: Warm and well perfused, no edema left lower extremity, SCDs in place on left lower extremity, wound VAC in place at right AKA site with minimal drainage, left upper extremity contracted  Laboratory: Recent Labs  Lab 07/22/20 0605 07/23/20 0157 07/24/20 0201  WBC 13.2* 9.6 10.8*  HGB 10.3* 9.1* 11.2*  HCT 31.5* 27.2* 36.7*  PLT 127* 114* 129*   Recent Labs  Lab 07/21/20 0436 07/21/20 2319 07/22/20 0605 07/23/20 0157 07/24/20 0201  NA 137   < > 138 137 138  K 3.6   < > 3.1* 3.6 4.2  CL 103   < >  107 105 110  CO2 23   < > 25 21* 20*  BUN 14   < > 19 11 9   CREATININE 0.60*   < > 0.69 0.51* 0.49*  CALCIUM 8.2*   < > 8.0* 7.4* 7.4*  PROT 5.5*  --  4.5* 4.6*  --   BILITOT 1.3*  --   0.8 0.7  --   ALKPHOS 114  --  98 77  --   ALT 82*  --  105* 66*  --   AST 109*  --  114* 101*  --   GLUCOSE 148*   < > 141* 80 48*   < > = values in this interval not displayed.      Imaging/Diagnostic Tests: DG Chest 2 View  Result Date: 07/24/2020 CLINICAL DATA:  Tachypnea for 1 day EXAM: CHEST - 2 VIEW COMPARISON:  07/21/2020 FINDINGS: Cardiac shadow is within normal limits. The overall inspiratory effort is poor but stable. Increasing effusion on the left is noted. Left retrocardiac consolidation is noted as well. IMPRESSION: Left retrocardiac consolidation with increasing left-sided pleural effusion. Electronically Signed   By: Inez Catalina M.D.   On: 07/24/2020 15:57     Zola Button, MD 07/25/2020, 6:50 AM PGY-1, [place] Intern pager: [phone], text pages welcome

## 2020-07-25 NOTE — Progress Notes (Signed)
Nutrition Follow-up  DOCUMENTATION CODES:   Severe malnutrition in context of chronic illness  INTERVENTION:   -Initiate 48 hour calorie count per MD -Continue 1 packet Juven BID, each packet provides 95 calories, 2.5 grams of protein (collagen), and 9.8 grams of carbohydrate (3 grams sugar); also contains 7 grams of L-arginine and L-glutamine, 300 mg vitamin C, 15 mg vitamin E, 1.2 mcg vitamin B-12, 9.5 mg zinc, 200 mg calcium, and 1.5 g  Calcium Beta-hydroxy-Beta-methylbutyrate to support wound healing -Magic cup TID with meals, each supplement provides 290 kcal and 9 grams of protein -Hormel Shake TID with meals, each supplement provides 520 kcals and 22 grams protein -30 ml Prosource Plus TID, each supplement provides 100 kcals and 15 grams protein -Feeding assistance with meals  NUTRITION DIAGNOSIS:   Severe Malnutrition related to chronic illness,wound healing as evidenced by severe fat depletion,severe muscle depletion,percent weight loss.  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 Of note, pt is non-verbal at baseline.  5/2 - diet advanced to regular, [facility] to [facility], s/p BSE- recommend downgrade to dysphagia 2 diet with thin liquids  5/6- s/p PROCEDURE:  RIGHT ABOVE KNEE AMPUTATION Application Prevena customizable wound VAC. 5/7- s/p BSE- advanced to dysphagia 2 diet with thin liquids  Reviewed I/O's: +1 L x 24 hours and +8.4 L since admission  Case discussed with pharmacist, who is request RD evaluate for supplements secondary to malnutrition and poor oral intake.   Per  documented meal completions, intake is very erratic- noted PO 0-100%.   Per palliative care notes, plan to transition to comfort measure if pt continues to decline. Plan to medically optimize pt and discharge to SNF.   Medications reviewed and include vitamin C and zinc sulfate.   Labs reviewed: K: 3.1.   Diet Order:   Diet Order            DIET DYS 2 Room service appropriate? Yes with Assist; Fluid consistency: Thin  Diet effective now                 EDUCATION NEEDS:   Education needs have been addressed  Skin:  Skin Assessment: Skin Integrity Issues: Skin Integrity Issues:: Unstageable,Other (Comment) Unstageable: Pressure injuries - R upper shoulder and R anterior hip Other: Wound on R foot (first 3 toes and bottom of foot black)  Last BM:  07/19/20  Height:   Ht Readings from Last 1 Encounters:  07/17/20 5\' 6"  (1.676 m)    Weight:   Wt Readings from Last 1 Encounters:  07/25/20 48.5 kg   BMI:  Body mass index is 17.27 kg/m.  Estimated Nutritional Needs:   Kcal:  1600-1800  Protein:  85-100 grams  Fluid:  >1.6 L    Loistine Chance, RD, LDN, [place] Registered Dietitian II Certified Diabetes Care and Education Specialist Please refer to [facility] for RD and/or RD on-call/weekend/after hours pager

## 2020-07-25 NOTE — Progress Notes (Signed)
FPTS Interim Progress Note  S: Patient seen for PM check given watcher status.  He is resting in room.  When asked if he vomited today he stated, "a little."  Denies complaints at present.  O: BP (!) 107/55 (BP Location: Left Arm)   Pulse 94   Temp 98.9 F (37.2 C) (Oral)   Resp (!) 22   Ht 5\' 6"  (1.676 m)   Wt 48.5 kg   SpO2 100%   BMI 17.27 kg/m    Physical Exam:  General: 80 y.o. male in NAD Lungs: Breathing comfortably on 3L per [place] Skin: warm and dry Extremities: s/p right AKA   A/P: Patient resting comfortably No further emesis MAPs stable Continue current management  Cleophas Dunker, DO 07/25/2020, 8:38 PM PGY-3, [facility] Medicine Service pager [phone]

## 2020-07-26 ENCOUNTER — Inpatient Hospital Stay (HOSPITAL_COMMUNITY): Payer: Medicare Other

## 2020-07-26 DIAGNOSIS — R059 Cough, unspecified: Secondary | ICD-10-CM

## 2020-07-26 LAB — CBC WITH DIFFERENTIAL/PLATELET
Abs Immature Granulocytes: 0.08 10*3/uL — ABNORMAL HIGH (ref 0.00–0.07)
Basophils Absolute: 0 10*3/uL (ref 0.0–0.1)
Basophils Relative: 0 %
Eosinophils Absolute: 0.1 10*3/uL (ref 0.0–0.5)
Eosinophils Relative: 1 %
HCT: 30.4 % — ABNORMAL LOW (ref 39.0–52.0)
Hemoglobin: 9.6 g/dL — ABNORMAL LOW (ref 13.0–17.0)
Immature Granulocytes: 1 %
Lymphocytes Relative: 4 %
Lymphs Abs: 0.5 10*3/uL — ABNORMAL LOW (ref 0.7–4.0)
MCH: 30.4 pg (ref 26.0–34.0)
MCHC: 31.6 g/dL (ref 30.0–36.0)
MCV: 96.2 fL (ref 80.0–100.0)
Monocytes Absolute: 0.4 10*3/uL (ref 0.1–1.0)
Monocytes Relative: 3 %
Neutro Abs: 12.4 10*3/uL — ABNORMAL HIGH (ref 1.7–7.7)
Neutrophils Relative %: 91 %
Platelets: 212 10*3/uL (ref 150–400)
RBC: 3.16 MIL/uL — ABNORMAL LOW (ref 4.22–5.81)
RDW: 13.9 % (ref 11.5–15.5)
WBC: 13.5 10*3/uL — ABNORMAL HIGH (ref 4.0–10.5)
nRBC: 0 % (ref 0.0–0.2)

## 2020-07-26 LAB — COMPREHENSIVE METABOLIC PANEL
ALT: 37 U/L (ref 0–44)
AST: 35 U/L (ref 15–41)
Albumin: 1.2 g/dL — ABNORMAL LOW (ref 3.5–5.0)
Alkaline Phosphatase: 92 U/L (ref 38–126)
Anion gap: 5 (ref 5–15)
BUN: 12 mg/dL (ref 8–23)
CO2: 24 mmol/L (ref 22–32)
Calcium: 7.6 mg/dL — ABNORMAL LOW (ref 8.9–10.3)
Chloride: 110 mmol/L (ref 98–111)
Creatinine, Ser: 0.46 mg/dL — ABNORMAL LOW (ref 0.61–1.24)
GFR, Estimated: >60 mL/min (ref >60)
Glucose, Bld: 88 mg/dL (ref 70–99)
Potassium: 3 mmol/L — ABNORMAL LOW (ref 3.5–5.1)
Sodium: 139 mmol/L (ref 135–145)
Total Bilirubin: 0.5 mg/dL (ref 0.3–1.2)
Total Protein: 4.5 g/dL — ABNORMAL LOW (ref 6.5–8.1)

## 2020-07-26 LAB — SARS CORONAVIRUS 2 (TAT 6-24 HRS): SARS Coronavirus 2: NEGATIVE

## 2020-07-26 MED ORDER — POTASSIUM CHLORIDE 20 MEQ PO PACK
40.0000 meq | PACK | Freq: Once | ORAL | Status: DC
  Filled 2020-07-26: qty 2

## 2020-07-26 MED ORDER — AMOXICILLIN-POT CLAVULANATE 250-62.5 MG/5ML PO SUSR
500.0000 mg | Freq: Two times a day (BID) | ORAL | Status: AC
  Administered 2020-07-26 – 2020-07-27 (×3): 500 mg via ORAL
  Filled 2020-07-26 (×4): qty 10

## 2020-07-26 MED ORDER — POTASSIUM CHLORIDE 20 MEQ PO PACK
40.0000 meq | PACK | Freq: Two times a day (BID) | ORAL | Status: AC
  Administered 2020-07-26 (×2): 40 meq via ORAL
  Filled 2020-07-26: qty 2

## 2020-07-26 MED ORDER — BOOST / RESOURCE BREEZE PO LIQD CUSTOM
1.0000 | Freq: Three times a day (TID) | ORAL | Status: DC
  Administered 2020-07-26 – 2020-07-27 (×3): 1 via ORAL

## 2020-07-26 NOTE — Plan of Care (Signed)
  Problem: Clinical Measurements: ?Goal: Will remain free from infection ?Outcome: Progressing ?Goal: Diagnostic test results will improve ?Outcome: Progressing ?Goal: Respiratory complications will improve ?Outcome: Progressing ?  ?Problem: Coping: ?Goal: Level of anxiety will decrease ?Outcome: Progressing ?  ?

## 2020-07-26 NOTE — Progress Notes (Signed)
Palliative Medicine RN Note: I received a call from Dr Nancy Fetter regarding discussion of hospice care at [facility] & rehospitalization. Upon chart review, including the PMT notes from Tacey Ruiz, Mr Zwiefelhofer's family would like pt to go to a SNF short-term with a plan to go to sister's house after SNF d/c. Family also told Sharyn Lull that if he declines, they would like hospice.  Unfortunately, without hospice involved, if he is in a SNF, he will likely be transferred back to the hospital. I did confirm there is a gold DNR on his chart.  At this time, I do not have a provider who can see him; Sharyn Lull returns Friday. Medicare guidelines prohibit the use of the SNF benefit if a patient is under hospice care, and he also seems to have a wound vac (also not accepted by hospices due to the training, supplies, and cost). Palliative recommends an outpatient palliative referral at SNF, and, once the wound vac is discontinued, transition to hospice if the family is in agreement. The outpatient palliative provider can assist with those discussions and transitions.  To set up outpatient palliative care, please place a TOC order for palliative to follow at SNF, then include that in BOTH discharge summary and discharge order.  If Mr Alvizo remains admitted when a provider is available, we will reach out to his sisters, but it sounds like goals are clear.  Marjie Skiff Connee Ikner, RN, BSN, [facility] Palliative Medicine Team 07/26/2020 1:42 PM Office [phone]

## 2020-07-26 NOTE — Plan of Care (Signed)
°  Problem: Clinical Measurements: °Goal: Will remain free from infection °Outcome: Progressing °Goal: Respiratory complications will improve °Outcome: Progressing °  °

## 2020-07-26 NOTE — Progress Notes (Signed)
  Speech Language Pathology Treatment: Dysphagia  Patient Details Name: Rick Schwartz MRN: [id number] DOB: 03/25/1940 Today's Date: 07/26/2020 Time: 5284-1324 SLP Time Calculation (min) (ACUTE ONLY): 10 min  Assessment / Plan / Recommendation Clinical Impression  Pt would only consume thin liquids today, so unable to directly assess solid intake, but he had no overt difficulties with thin liquids and RN says no trouble at breakfast and no reported difficulties at lunch. She did share that he had some emesis after getting his Juven. Note on imaging from today that there is some concern for PNA, as well as some concern for gaseous distention of the stomach and colon. Given that he had been tolerating Dys 2 diet and thin liquids previously this admission, suspect that risk for aspiration may be greatest post-prandially if he continues to have vomiting. However, given very limited assessment today, will f/u at least an additional time to better assess his oropharyngeal swallowing.   HPI HPI: Patient is a 80 y.o. male with PMH: HTN, PAD, cerebral palsy, who was admitted to [facility] from home with severe sepsis due to infected right foot ulcer. CXR was unremarkable.  His sister had reported that patient has had difficulty chewing and swallowing recently, leading to decreased intake.  Pt was seen for a BSE on 5/2 with recommendations for Dysphagia 2/thin liquid.  ST signed off and was re-consulted on 5/5 secondary to acute change.  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      SLP Plan  Continue with current plan of care       Recommendations  Diet recommendations: Dysphagia 2 (fine chop);Thin liquid Liquids provided via: Straw Medication Administration: Whole meds with puree Supervision: Full supervision/cueing for compensatory strategies;Trained caregiver to feed patient Compensations: Minimize environmental distractions;Slow rate;Small sips/bites Postural Changes and/or Swallow Maneuvers:  Seated upright 90 degrees;Upright 30-60 min after meal                Oral Care Recommendations: Oral care BID;Staff/trained caregiver to provide oral care Follow up Recommendations: 24 hour supervision/assistance SLP Visit Diagnosis: Dysphagia, oral phase (R13.11) Plan: Continue with current plan of care       GO                Osie Bond., M.A. [facility] Pager [phone] Office [phone]  07/26/2020, 2:33 PM

## 2020-07-26 NOTE — Progress Notes (Signed)
Calorie Count Note  48 hour calorie count ordered.  Diet: dysphagia 2 diet with thin liquids Supplements: 1 packet Juven BID, each packet provides 95 calories, 2.5 grams of protein (collagen), and 9.8 grams of carbohydrate (3 grams sugar); also contains 7 grams of L-arginine and L-glutamine, 300 mg vitamin C, 15 mg vitamin E, 1.2 mcg vitamin B-12, 9.5 mg zinc, 200 mg calcium, and 1.5 g  Calcium Beta-hydroxy-Beta-methylbutyrate to support wound healing; Magic cup TID with meals, each supplement provides 290 kcal and 9 grams of protein; Hormel Shake TID with meals, each supplement provides 520 kcals and 22 grams protein; 30 ml Prosource Plus TID, each supplement provides 100 kcals and 15 grams protein  Pt sleeping soundly at time of visit and did not arouse to voice.   Case discussed with RN, who reports pt consumed about 30% of breakfast this morning. Per RN, pt had an episode of vomiting today and yesterday. She suspects that Juven exacerbates the vomiting. Additionally, she reports that pt does not like milky supplements- refused both Magic Cup and Hormel Shake off tray today.   Breakfast: 674 kcals, 28 grams protein Lunch: 37 kcals, 2 grams protein Dinner: 247 kcals, 9 grams protein  Total intake: 958 kcal (60% of minimum estimated needs)  39 protein (46% of minimum estimated needs)  Nutrition Dx: Severe Malnutrition related to chronic illness,wound healing as evidenced by severe fat depletion,severe muscle depletion,percent weight loss; ongoing  Goal: Patient will meet greater than or equal to 90% of their needs; progressing   Intervention:   -Continue calorie count -D/c Magic cup TID with meals, each supplement provides 290 kcal and 9 grams of protein -D/c Hormel Shake TID -Continue feeding assistance with meals -D/c Juven  -Continue 30 ml Prosource Plus TID, each supplement provides 100 kcals and 15 grams protein -Boost Breeze po TID, each supplement provides 250 kcal and 9 grams  of protein  Loistine Chance, RD, LDN, CDCES Registered Dietitian II Certified Diabetes Care and Education Specialist Please refer to [facility] for RD and/or RD on-call/weekend/after hours pager

## 2020-07-26 NOTE — Progress Notes (Signed)
Patient weaned off oxygen without any difficulty.  Covid test for discharge ordered, collected and sent to lab. Patient presents with poor appetite at meals. Rick Schwartz

## 2020-07-26 NOTE — Progress Notes (Addendum)
Family Medicine Teaching Service Daily Progress Note Intern Pager: [phone]  Patient name: Rick Schwartz Medical record number: [id number] Date of birth: 02-25-1941 Age: 80 y.o. Gender: male  Primary Care Provider: Matilde Haymaker, MD Consultants: Orthopedics, palliative care   Code Status: DNR  Pt Overview and Major Events to Date:  05/01: Admitted to [facility] 05/05: Rapid Response Called for Sepsis 05/06: Transfer of care to [facility] 05/06: AKA surgery  Assessment and Plan: Rick Schwartz is a 80 y.o. 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 PMHx significant for CP, PAD, HTN.  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 BP improving, SBP 90s-100s. Afebrile since 5/5. Leukocytosis worsening, 13.5 this AM from 12.9. Will transition to PO antibiotics today, plan for 7-day antibiotic course for treatment of aspiration pneumonia. - orthopedics following, appreciate recommendations - start amoxicillin-clavulanate (5/10-), plan to continue until 5/11 - d/c piperacillin-tazobactam (5/5-5/9) - s/p vancomycin (5/1-5/7) - s/p cefepime (5/1, 5/5) - s/p CTX (5/1-5/3)  Acute hypoxemic respiratory failure Due to presumed aspiration pneumonia noted 5/5 on CXR during rapid response from aspiration event. Weaned to 2L from 3L while in room maintaining SpO2 100%. - antibiotics as above - D5NS 20 cc/hr - wean O2 as tolerated - aspiration precautions  Goals of care Poor prognosis given frailty and poor oral intake. Plan to continue current treatment however if condition declines do not escalate treatment and will switch to comfort care measures and outpatient palliative care follow up on discharge. Hospice may be a good option for him given continued poor oral intake, will need to discuss with palliative/family. - palliative care following, appreciate involvement - DNR - CMO if declining, will  not transfer to ICU if indicated per family wishes  Right subclavian vein DVT Yesterday started on lower dose apixaban per clinical judgment given age and low BMI. - apixaban 5 mg BID x 7 days, then 2.5 mg BID  Hypokalemia Potassium 3.0 this morning, likely from poor p.o. intake.  Yesterday, received 40 mEq KCl for K 3.1 with minimal response. - PO KCl 40 mEq x2  Transaminitis Resolved.  Severe protein calorie malnutrition In the setting of severe illness, most recent albumin 1.3. - Juven BID  HTN Has been hypotensive to soft BP.  Continue holding home antihypertensives.  FEN/GI: DYS 2, D5NS 20 mL/h PPx: SCDs  Disposition: progressive, SNF recommended when medically stable  Subjective:  NAOE.  No concerns expressed this morning.  Patient states he has not no further episodes of vomiting since yesterday afternoon.  He has eaten about half of his breakfast and denies any choking or vomiting with this.  Objective: Temp:  [97.4 F (36.3 C)-99.3 F (37.4 C)] 98 F (36.7 C) (05/10 0400) Pulse Rate:  [81-108] 91 (05/10 0400) Resp:  [19-22] 21 (05/10 0400) BP: (99-108)/(33-91) 99/59 (05/10 0400) SpO2:  [96 %-100 %] 97 % (05/10 0400) Weight:  [49.9 kg] 49.9 kg (05/10 0000) Physical Exam: General: Thin elderly male resting in bed comfortably, NAD Cardiovascular: RRR, no murmurs Respiratory: Transmitted upper airway coarse breath sounds, otherwise clear to auscultation bilaterally, breathing comfortably on 2 L nasal cannula Abdomen: Soft, nontender, positive bowel sounds Extremities: Warm and well perfused, no edema left lower extremity, SCDs in place on left lower extremity, wound VAC in place at right AKA site with no drainage in canister, left upper extremity contracted, right upper forearm with 1+ pitting edema  Laboratory: Recent Labs  Lab 07/24/20 0201 07/25/20 0755 07/26/20 0400  WBC 10.8* 12.9* 13.5*  HGB 11.2* 9.9* 9.6*  HCT 36.7* 30.1* 30.4*  PLT 129* 215 212    Recent Labs  Lab 07/22/20 0605 07/23/20 0157 07/24/20 0201 07/25/20 0755 07/26/20 0400  NA 138 137 138 139 139  K 3.1* 3.6 4.2 3.1* 3.0*  CL 107 105 110 110 110  CO2 25 21* 20* 23 24  BUN 19 11 9 12 12   CREATININE 0.69 0.51* 0.49* 0.47* 0.46*  CALCIUM 8.0* 7.4* 7.4* 7.6* 7.6*  PROT 4.5* 4.6*  --   --  4.5*  BILITOT 0.8 0.7  --   --  0.5  ALKPHOS 98 77  --   --  92  ALT 105* 66*  --   --  37  AST 114* 101*  --   --  35  GLUCOSE 141* 80 48* 91 88      Imaging/Diagnostic Tests: No results found.   Zola Button, MD 07/26/2020, 7:01 AM PGY-1, [place] Intern pager: [phone], text pages welcome

## 2020-07-26 NOTE — Progress Notes (Signed)
Physical Therapy Treatment Patient Details Name: Rick Schwartz MRN: [id number] DOB: [birth year] Today's Date: 07/26/2020    History of Present Illness 80 y.o. male presents to [facility] on 07/17/2020 with R foot ulcer, transferred to [facility] hospital for ortho workup. Pt septic due to R foot infection. Pt is nonverbal at baseline, also with history of cerebral palsy. Concern for aspiration event on 5/5 2/2 emesis. 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    PT Comments    Patient's mobility limited today by hypotension once sitting EOB. Requires Max A of 2 to get to EOB but pt demonstrates good initiation. Able to roll in both directions multiple times for pericare. Tolerated there ex in supine with right residual limb well. HR in 120s during activity. Supine BP 112/33 (47), sitting BP 72/41 (47), supine BP 96/54 (63), supine BP after 3 mins 90/69 (77).  Pt asymptomatic per report. Continue to recommend SNF to maximize independence and decrease burden of care. Will follow.   Follow Up Recommendations  SNF     Equipment Recommendations  Other (comment);Hospital bed (ramp, lift)    Recommendations for Other Services       Precautions / Restrictions Precautions Precautions: Fall;Other (comment) Precaution Comments: wound vac, hypotensive Restrictions Other Position/Activity Restrictions: right AKA    Mobility  Bed Mobility Overal bed mobility: Needs Assistance Bed Mobility: Rolling;Sit to Supine;Supine to Sit Rolling: Mod assist;Max assist (Max A to roll to left and Mod A to roll to right)   Supine to sit: Max assist;HOB elevated;+2 for physical assistance Sit to supine: Max assist;+2 for physical assistance   General bed mobility comments: Assist with LEs and trunk to get to EOB, heavy posterior lean pushing backwards. Assist of 2 to get to EOB due to low BP.    Transfers                 General transfer comment: Deferred due to hypotensive  Ambulation/Gait                  Stairs             Wheelchair Mobility    Modified Rankin (Stroke Patients Only)       Balance Overall balance assessment: Needs assistance Sitting-balance support: Feet unsupported;No upper extremity supported Sitting balance-Leahy Scale: Zero Sitting balance - Comments: Max A needed to maintain sitting balance; Able to initiate forward weight shift with cues but difficulty sustaining. Postural control: Posterior lean                                  Cognition Arousal/Alertness: Awake/alert Behavior During Therapy: Flat affect Overall Cognitive Status: No family/caregiver present to determine baseline cognitive functioning                                 General Comments: Pt is oriented to person and place. Pt follows commands well.      Exercises Amputee Exercises Hip Extension: AROM;Right;5 reps;Supine Hip ABduction/ADduction: AAROM;Right;5 reps;Supine Hip Flexion/Marching: AROM;Right;5 reps;Supine    General Comments General comments (skin integrity, edema, etc.): HR in 120s during activity. Supine BP112/33 (47), sitting BP 72/41 (47), supine BP 96/54 (63), supine BP after 3 mins 90/69 (77).      Pertinent Vitals/Pain Pain Assessment: Faces Faces Pain Scale: Hurts a little bit Pain Location: denies pain but grimaces with movement  Pain Descriptors / Indicators: Grimacing Pain Intervention(s): Monitored during session    Home Living                      Prior Function            PT Goals (current goals can now be found in the care plan section) Progress towards PT goals: Not progressing toward goals - comment (due to hypotension)    Frequency    Min 2X/week      PT Plan Current plan remains appropriate    Co-evaluation              AM-PAC PT "6 Clicks" Mobility   Outcome Measure  Help needed turning from your back to your side while in a flat bed without using bedrails?: A Lot Help  needed moving from lying on your back to sitting on the side of a flat bed without using bedrails?: Total Help needed moving to and from a bed to a chair (including a wheelchair)?: Total Help needed standing up from a chair using your arms (e.g., wheelchair or bedside chair)?: Total Help needed to walk in hospital room?: Total Help needed climbing 3-5 steps with a railing? : Total 6 Click Score: 7    End of Session   Activity Tolerance: Treatment limited secondary to medical complications (Comment) (hypotensive) Patient left: in bed;with call bell/phone within reach;with SCD's reapplied Nurse Communication: Mobility status;Need for lift equipment PT Visit Diagnosis: Muscle weakness (generalized) (M62.81);Unsteadiness on feet (R26.81);Other abnormalities of gait and mobility (R26.89)     Time: 4174-0814 PT Time Calculation (min) (ACUTE ONLY): 25 min  Charges:  $Therapeutic Activity: 23-37 mins                     Marisa Severin, PT, DPT Acute Rehabilitation Services Pager [phone] Office [place] 07/26/2020, 9:41 AM

## 2020-07-26 NOTE — Plan of Care (Signed)
  Problem: Clinical Measurements: Goal: Will remain free from infection Outcome: Progressing   Problem: Clinical Measurements: Goal: Diagnostic test results will improve Outcome: Progressing   Problem: Clinical Measurements: Goal: Respiratory complications will improve Outcome: Progressing   

## 2020-07-26 NOTE — Progress Notes (Signed)
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  He is resting comfortably in bed  Wound VAC is in place there are no green checks but is functioning 0 cc in the canister  Plan for discharge to SNF when medically stable will need 1 week follow-up in our office

## 2020-07-26 NOTE — TOC Progression Note (Signed)
Transition of Care [facility]) - Progression Note    Patient Details  Name: Rick Schwartz MRN: [id number] Date of Birth: Mar 09, 1941  Transition of Care [facility]) CM/SW [place], [place] Phone Number: 07/26/2020, 3:03 PM  Clinical Narrative:    11:21am- CSW spoke with pt sister and has agreed to [facility], [place] [facility] with ref number [id number].CSW will follow up with [facility] and pt sister.  3:00pm- Auth was approved for pt waiting for covid to come back for DC.  Expected Discharge Plan: Skilled Nursing Facility Barriers to Discharge: Continued Medical Work up,Insurance Authorization,SNF Pending bed offer  Expected Discharge Plan and Services Expected Discharge Plan: [place] arrangements for the past 2 months: Apartment                                       Social Determinants of Health (SDOH) Interventions    Readmission Risk Interventions No flowsheet data found.

## 2020-07-27 ENCOUNTER — Other Ambulatory Visit: Payer: Medicare Other

## 2020-07-27 LAB — CBC WITH DIFFERENTIAL/PLATELET
Abs Immature Granulocytes: 0.09 10*3/uL — ABNORMAL HIGH (ref 0.00–0.07)
Basophils Absolute: 0 10*3/uL (ref 0.0–0.1)
Basophils Relative: 0 %
Eosinophils Absolute: 0.1 10*3/uL (ref 0.0–0.5)
Eosinophils Relative: 1 %
HCT: 30.5 % — ABNORMAL LOW (ref 39.0–52.0)
Hemoglobin: 9.8 g/dL — ABNORMAL LOW (ref 13.0–17.0)
Immature Granulocytes: 1 %
Lymphocytes Relative: 5 %
Lymphs Abs: 0.6 10*3/uL — ABNORMAL LOW (ref 0.7–4.0)
MCH: 30.7 pg (ref 26.0–34.0)
MCHC: 32.1 g/dL (ref 30.0–36.0)
MCV: 95.6 fL (ref 80.0–100.0)
Monocytes Absolute: 0.5 10*3/uL (ref 0.1–1.0)
Monocytes Relative: 5 %
Neutro Abs: 9.5 10*3/uL — ABNORMAL HIGH (ref 1.7–7.7)
Neutrophils Relative %: 88 %
Platelets: 228 10*3/uL (ref 150–400)
RBC: 3.19 MIL/uL — ABNORMAL LOW (ref 4.22–5.81)
RDW: 13.9 % (ref 11.5–15.5)
WBC: 10.7 10*3/uL — ABNORMAL HIGH (ref 4.0–10.5)
nRBC: 0 % (ref 0.0–0.2)

## 2020-07-27 LAB — BASIC METABOLIC PANEL
Anion gap: 5 (ref 5–15)
BUN: 9 mg/dL (ref 8–23)
CO2: 23 mmol/L (ref 22–32)
Calcium: 7.8 mg/dL — ABNORMAL LOW (ref 8.9–10.3)
Chloride: 112 mmol/L — ABNORMAL HIGH (ref 98–111)
Creatinine, Ser: 0.46 mg/dL — ABNORMAL LOW (ref 0.61–1.24)
GFR, Estimated: >60 mL/min (ref >60)
Glucose, Bld: 160 mg/dL — ABNORMAL HIGH (ref 70–99)
Potassium: 3.7 mmol/L (ref 3.5–5.1)
Sodium: 140 mmol/L (ref 135–145)

## 2020-07-27 MED ORDER — ASCORBIC ACID 1000 MG PO TABS
1000.0000 mg | ORAL_TABLET | Freq: Every day | ORAL | Status: DC
Start: 2020-07-28 — End: 2020-11-23

## 2020-07-27 MED ORDER — PROSOURCE PLUS PO LIQD: 30.0000 mL | Freq: Three times a day (TID) | ORAL | Status: DC

## 2020-07-27 MED ORDER — GERHARDT'S BUTT CREAM: 1.0000 "application " | TOPICAL_CREAM | Freq: Three times a day (TID) | CUTANEOUS | Status: DC

## 2020-07-27 MED ORDER — APIXABAN 5 MG PO TABS: 5.0000 mg | ORAL_TABLET | Freq: Two times a day (BID) | ORAL | Status: DC

## 2020-07-27 MED ORDER — ROSUVASTATIN CALCIUM 20 MG PO TABS: 20.0000 mg | ORAL_TABLET | Freq: Every day | ORAL | 0 refills | Status: DC

## 2020-07-27 MED ORDER — APIXABAN 2.5 MG PO TABS: 2.5000 mg | ORAL_TABLET | Freq: Two times a day (BID) | ORAL | Status: DC

## 2020-07-27 MED ORDER — POTASSIUM CHLORIDE CRYS ER 10 MEQ PO TBCR: 10.0000 meq | EXTENDED_RELEASE_TABLET | Freq: Every day | ORAL | 0 refills | Status: DC

## 2020-07-27 NOTE — Progress Notes (Signed)
D/C instructions printed and placed in packet at nurse's station. Awaiting transport.

## 2020-07-27 NOTE — Progress Notes (Signed)
Patient is 5 days status post right above-knee amputation.  He is doing well his white cell count is going down   Wound VAC was removed today.  Incision is well with well opposed wound edges no necrosis minimal bloody drainage no cellulitis or signs of infection  Will order daily dressing changes and may cleanse amputation stump.  We will follow-up in our office in 1 week

## 2020-07-27 NOTE — Progress Notes (Signed)
Occupational Therapy Treatment Patient Details Name: Rick Schwartz MRN: [id number] DOB: [birth year] Today's Date: 07/27/2020    History of present illness 80 y.o. male presents to [facility] on 07/17/2020 with R foot ulcer, transferred to [facility] hospital for ortho workup. Pt septic due to R foot infection. Pt is nonverbal at baseline, also with history of cerebral palsy. Concern for aspiration event on 5/5 2/2 emesis. 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   OT comments  Pt in bed upon arrival, pleasant. Pt required max A for bed mobility, rolling sup - sit. Pt required max A for balance/support to sit EOB. Pt washed face min A with min verbal cues to initiate. Pt sat EOB x 5 minutes, fatigued easily and returned to supine total A. Pt still to d/c to SNF for further care  Follow Up Recommendations  SNF    Equipment Recommendations  Other (comment) (TBD at SNF)    Recommendations for Other Services      Precautions / Restrictions Precautions Precautions: Fall;Other (comment) Precaution Comments: wound vac Restrictions Weight Bearing Restrictions: Yes RLE Weight Bearing: Non weight bearing Other Position/Activity Restrictions: right AKA       Mobility Bed Mobility Overal bed mobility: Needs Assistance Bed Mobility: Rolling;Sit to Supine;Supine to Sit Rolling: Mod assist;Max assist Sidelying to sit: Max assist Supine to sit: Max assist Sit to supine: Max assist   General bed mobility comments: Assist with LEs and trunk to get to EOB, heavy posterior lean pushing backwards.    Transfers                      Balance Overall balance assessment: Needs assistance Sitting-balance support: Feet unsupported;No upper extremity supported   Sitting balance - Comments: Max A needed to maintain sitting balance Postural control: Posterior lean                                 ADL either performed or assessed with clinical judgement   ADL Overall ADL's : Needs  assistance/impaired     Grooming: Wash/dry face;Minimal assistance Grooming Details (indicate cue type and reason): max A for balance sitting EOB                               General ADL Comments: Pt sat EOB x 5 minutes     Vision Patient Visual Report: No change from baseline     Perception     Praxis      Cognition Arousal/Alertness: Awake/alert Behavior During Therapy: Flat affect Overall Cognitive Status: No family/caregiver present to determine baseline cognitive functioning                                 General Comments: Pt is oriented to person and place. Pt follows commands well.        Exercises     Shoulder Instructions       General Comments      Pertinent Vitals/ Pain       Pain Assessment: Faces Faces Pain Scale: Hurts a little bit Pain Location: grimaces with movement Pain Descriptors / Indicators: Grimacing Pain Intervention(s): Monitored during session;Repositioned  Home Living  Prior Functioning/Environment              Frequency  Min 2X/week        Progress Toward Goals  OT Goals(current goals can now be found in the care plan section)  Progress towards OT goals: OT to reassess next treatment     Plan Discharge plan remains appropriate    Co-evaluation                 AM-PAC OT "6 Clicks" Daily Activity     Outcome Measure   Help from another person eating meals?: A Little Help from another person taking care of personal grooming?: A Little Help from another person toileting, which includes using toliet, bedpan, or urinal?: Total Help from another person bathing (including washing, rinsing, drying)?: Total Help from another person to put on and taking off regular upper body clothing?: A Lot Help from another person to put on and taking off regular lower body clothing?: Total 6 Click Score: 11    End of Session    OT Visit  Diagnosis: Unsteadiness on feet (R26.81);Other abnormalities of gait and mobility (R26.89);Muscle weakness (generalized) (M62.81)   Activity Tolerance Patient limited by fatigue   Patient Left in bed;with call bell/phone within reach;with bed alarm set   Nurse Communication          Time: 5093-2671 OT Time Calculation (min): 17 min  Charges: OT General Charges $OT Visit: 1 Visit OT Treatments $Self Care/Home Management : 8-22 mins    Emmit Alexanders [facility] 07/27/2020, 3:08 PM

## 2020-07-27 NOTE — TOC Transition Note (Signed)
Transition of Care [facility]) - CM/SW Discharge Note   Patient Details  Name: Rick Schwartz MRN: [id number] Date of Birth: 1940/10/09  Transition of Care [facility]) CM/SW Contact:  Tresa Endo Phone Number: 07/27/2020, 10:49 AM   Clinical Narrative:    Patient will DC to: [facility] Anticipated DC date: 07/27/2020 Family notified: Pt sister Transport by: Corey Harold   Per MD patient ready for DC to [facility] Room 126. RN to call report prior to discharge [phone]). RN, patient, patient's family, and facility notified of DC. Discharge Summary and FL2 sent to facility. DC packet on chart. Ambulance transport requested for patient.   CSW will sign off for now as social work intervention is no longer needed. Please consult [place] again if new needs arise.        Barriers to Discharge: Continued Medical Work up,Insurance Authorization,SNF Pending bed offer   Patient Goals and CMS Choice   CMS Medicare.gov Compare Post Acute Care list provided to:: Patient Represenative (must comment) (sister Rick Schwartz) Choice offered to / list presented to : Sibling (Sister Rick Schwartz)  Discharge Placement                       Discharge Plan and Services                                     Social Determinants of Health (SDOH) Interventions     Readmission Risk Interventions No flowsheet data found.

## 2020-07-27 NOTE — Discharge Summary (Signed)
[facility] Discharge Summary  Patient name: Rick Schwartz Medical record number: [id number] Date of birth: 1940-09-04 Age: 80 y.o. Gender: male Date of Admission: 07/17/2020  Date of Discharge: 07/27/2020  Admitting Physician: Rhetta Mura, DO  Primary Care Provider: Matilde Haymaker, MD Consultants: Orthopedics, palliative care  Indication for Hospitalization: Sepsis secondary to gangrene of the right lower extremity  Discharge Diagnoses/Problem List:  Principal Problem:   Foot ulcer with necrosis of muscle, right (HCC) Active Problems:   HYPERTENSION, BENIGN SYSTEMIC   Cough   Gangrene of right foot (HCC)   Severe sepsis (HCC)   Lactic acidosis   Hypernatremia   Hypercalcemia   Severe protein-calorie malnutrition (HCC)   SBO (small bowel obstruction) (HCC)   Inanition (HCC)    Disposition: SNF  Discharge Condition: Stable  Discharge Exam:  General: Thin elderly male resting in bed comfortably, NAD Cardiovascular: RRR, no murmurs Respiratory: Decreased breath sounds left lower lung field, otherwise CTAB, breathing comfortably on room air Abdomen: Soft, nontender, positive bowel sounds Extremities: Warm and well perfused, no edema left lower extremity, SCDs in place on left lower extremity, right AKA site bandaged and CDI, left upper extremity contracted, no right upper forearm edema   Brief Hospital Course:  Rick Schwartz is a 80 y.o. 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  Initial elevated lactate at 3.5 which improved to 2.4 after receiving IV fluids.  Initial blood cultures negative, patient was started on broad-spectrum  IV antibiotics.  Right foot XR demonstrated gas in the plantar soft tissues of foot highly suspicious of soft tissue infection, no overt evidence of osteomyelitis.  On 5/6, patient underwent right AKA with orthopedic surgery with placement of wound VAC.  Patient had persistent issues with hypotension/soft BP requiring multiple fluid boluses but stabilized prior to discharge.  Lactic acid trended down to normal with IV fluids.  Wound VAC removed on 5/11 prior to discharge.  Antibiotics received: amoxicillin-clavulanate (5/10-5/11) IV piperacillin-tazobactam (5/5-5/9) IV vancomycin (5/1-5/7) IV cefepime (5/1, 5/5) IV CTX (5/1-5/3)  Acute hypoxemic respiratory failure Patient had a rapid response event on 5/5 due to aspiration CXR obtained revealed left retrocardiac density consistent with atelectasis/infiltrate.  Patient was continued on antibiotics to treat for possible aspiration pneumonia.  Patient was weaned to room air prior to discharge and leukocytosis improved with WBC 10.7 on discharge.  Patient was placed on dysphagia 2 diet per SLP evaluation, continues to be at risk for aspiration.  Hypernatremia Initial sodium 149 likely secondary from dehydration.  Patient was initially on D5 1/2 NS with improvement in sodium.  Na prior to discharge 140.  Right subclavian vein DVT Noted on DVT US.  Patient was started on lower dose apixaban (5 mg twice daily x7 days, then 2.5 mg twice daily) on 5/9 for clinical judgment given low BMI and age.  Hypokalemia Patient received multiple doses of KCl supplementation throughout hospitalization to replete potassium.  Potassium 3.7 on day of discharge.  Discharged on KCl 10 mEq daily.  Severe protein calorie malnutrition Patient evaluated by RD and received nutritional supplements throughout hospitalization.  Estimated to meet about 60% of minimal caloric needs on 48-hour calorie count.  Goals of care Palliative care was involved to assist with goals of  care.  Poor prognosis given poor oral intake and risk of aspiration.  Upon discussions with family, they opted to continue medical treatment but did not want ICU level of care if needed.  Patient is DNR.  Outpatient palliative care order placed on discharge for further discussion about possibly transitioning to hospice while at SNF if family amenable.  Other problems chronic and stable.   Issues for Follow Up:  1. Discharged on KCl 20 mEq daily, re-check BMP to check potassium in the next 2-3 days and adjust as appropriate. 2. Ensure orthopedic follow-up in 1 week. 3. Wound care: Daily dressing changes and may cleanse amputation stump. 4. Patient to continue apixaban 2.5 mg BID until 5/16, then increase to apixaban 5 mg BID starting 5/17 for treatment of DVT. 5. Patient placed on dysphagia 2 diet during hospitalization. 6. TOC order placed for palliative care to follow at SNF.  Significant Procedures:  5/6 right AKA with placement of wound VAC (removed 5/11)  Significant Labs and Imaging:  Recent Labs  Lab 07/25/20 0755 07/26/20 0400 07/27/20 0250  WBC 12.9* 13.5* 10.7*  HGB 9.9* 9.6* 9.8*  HCT 30.1* 30.4* 30.5*  PLT 215 212 228   Recent Labs  Lab 07/21/20 0436 07/21/20 2319 07/22/20 0605 07/22/20 0822 07/23/20 0157 07/24/20 0201 07/25/20 0755 07/26/20 0400 07/27/20 0250  NA 137   < > 138  --  137 138 139 139 140  K 3.6   < > 3.1*  --  3.6 4.2 3.1* 3.0* 3.7  CL 103   < > 107  --  105 110 110 110 112*  CO2 23   < > 25  --  21* 20* 23 24 23   GLUCOSE 148*   < > 141*  --  80 48* 91 88 160*  BUN 14   < > 19  --  11 9 12 12 9   CREATININE 0.60*   < > 0.69  --  0.51* 0.49* 0.47* 0.46* 0.46*  CALCIUM 8.2*   < > 8.0*  --  7.4* 7.4* 7.6* 7.6* 7.8*  MG 1.7  --   --   --   --   --   --   --   --   PHOS 2.0*  --   --  2.2*  --   --   --   --   --   ALKPHOS 114  --  98  --  77  --   --  92  --   AST 109*  --  114*  --  101*  --   --  35  --   ALT 82*  --  105*  --  66*  --   --  37   --   ALBUMIN 1.4*  --  1.0*  --  1.3*  --   --  1.2*  --    < > = values in this interval not displayed.    DG Chest 1 View  Result Date: 07/26/2020 CLINICAL DATA:  Vomiting and leukocytosis. EXAM: CHEST  1 VIEW COMPARISON:  None. FINDINGS: AP semiupright view of the chest shows low volumes with asymmetric elevation of the right hemidiaphragm. Patient's right arm obscures the right lung. There is hazy airspace disease in the left base with left pleural effusion and some tethering or tenting of the major fissure. Heart size normal. Diffuse gaseous distention of the stomach and colon noted  under the hemidiaphragms. IMPRESSION: 1. Low lung volumes with hazy atelectasis or pneumonia in the left base and small left pleural effusion. 2. Diffuse gaseous distention of the stomach and colon. Electronically Signed   By: Misty Stanley M.D.   On: 07/26/2020 13:45   DG Chest 2 View  Result Date: 07/24/2020 CLINICAL DATA:  Tachypnea for 1 day EXAM: CHEST - 2 VIEW COMPARISON:  07/21/2020 FINDINGS: Cardiac shadow is within normal limits. The overall inspiratory effort is poor but stable. Increasing effusion on the left is noted. Left retrocardiac consolidation is noted as well. IMPRESSION: Left retrocardiac consolidation with increasing left-sided pleural effusion. Electronically Signed   By: Inez Catalina M.D.   On: 07/24/2020 15:57   DG Abd 1 View  Result Date: 07/26/2020 CLINICAL DATA:  Vomiting and leukocytosis EXAM: ABDOMEN - 1 VIEW COMPARISON:  Jul 22, 2020 FINDINGS: There is no appreciable bowel dilatation or air-fluid level to suggest bowel obstruction. No evident free air. 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 There are foci of aortic and iliac artery atherosclerotic calcification. Degenerative change noted in the lumbar spine. IMPRESSION: No bowel obstruction or free air.  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 Aortic Atherosclerosis (ICD10-I70.0). Electronically Signed   By: Lowella Grip III M.D.   On:  07/26/2020 13:44   DG Abd 1 View  Result Date: 07/21/2020 CLINICAL DATA:  Abdominal pain EXAM: ABDOMEN - 1 VIEW COMPARISON:  None. FINDINGS: Stomach incompletely visualized. Suggestion of gastric distension from air. Multiple loops of mildly dilated small bowel without air-fluid levels. Air is seen in colon. No free air evident on supine examination. There is aortic and iliac artery atherosclerosis. 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 IMPRESSION: Bowel dilatation without air-fluid levels. Suspect a degree of ileus or potential enteritis. Bowel obstruction felt to be less likely. No free air evident on supine examination. 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 Aortic Atherosclerosis (ICD10-I70.0). Electronically Signed   By: Lowella Grip III M.D.   On: 07/21/2020 12:15   CT Head Wo Contrast  Result Date: 07/08/2020 CLINICAL DATA:  Difficulty speaking x1 EXAM: CT HEAD WITHOUT CONTRAST TECHNIQUE: Contiguous axial images were obtained from the base of the skull through the vertex without intravenous contrast. COMPARISON:  12/03/2011 FINDINGS: Brain: No evidence of acute infarction, hemorrhage, or hydrocephalus. Stable large extra-axial cyst overlying the left cerebral hemisphere with underlying mass effect, chronic. Possible underlying chronic underlying encephalomalacia. Vascular: Intracranial atherosclerosis. Skull: Normal. Negative for fracture or focal lesion. Stable right frontal lipoma. Sinuses/Orbits: The visualized paranasal sinuses are essentially clear. The mastoid air cells are unopacified. Other: None. IMPRESSION: No evidence of acute intracranial abnormality. Chronic left extra-axial cyst.  Chronic right frontal lipoma. Electronically Signed   By: Julian Hy M.D.   On: 07/08/2020 10:32   DG CHEST PORT 1 VIEW  Result Date: 07/21/2020 CLINICAL DATA:  Cough EXAM: PORTABLE CHEST 1 VIEW COMPARISON:  07/17/2020 FINDINGS: Cardiac shadow is enlarged but accentuated by patient rotation. Aortic  calcifications are noted. The lungs are well aerated bilaterally. Some increasing left basilar opacity is noted in the retrocardiac region. No bony abnormality is seen. IMPRESSION: Increasing left retrocardiac density consistent with atelectasis/infiltrate. Electronically Signed   By: Inez Catalina M.D.   On: 07/21/2020 12:15   DG Chest Port 1 View  Result Date: 07/17/2020 CLINICAL DATA:  Sepsis EXAM: PORTABLE CHEST 1 VIEW COMPARISON:  09/24/2007 FINDINGS: 2 frontal views of the chest are obtained. Evaluation is limited due to right upper extremity contractures obscuring portions of  the right hemithorax. Cardiac silhouette is unremarkable. No airspace disease, effusion, or pneumothorax. There are 2 metallic densities projecting over the left chest, which may be on the patient's clothing. No acute bony abnormalities. IMPRESSION: 1. No acute intrathoracic process. Electronically Signed   By: Randa Ngo M.D.   On: 07/17/2020 17:56   DG Abd Portable 1V  Result Date: 07/22/2020 CLINICAL DATA:  Abdominal distension. EXAM: PORTABLE ABDOMEN - 1 VIEW COMPARISON:  Jul 21, 2020 FINDINGS: Air-filled loops of large and small bowel are seen without definite evidence of bowel dilatation. A moderate amount of stool is noted within the ascending colon. No radio-opaque calculi are seen. Multiple prostate radiation implantation seeds are noted. IMPRESSION: Nonspecific bowel gas pattern without definite evidence of bowel obstruction. Electronically Signed   By: Virgina Norfolk M.D.   On: 07/22/2020 20:51   DG Foot 2 Views Right  Result Date: 07/17/2020 CLINICAL DATA:  80 year old male with ischemic RIGHT foot with oozing and discoloration. EXAM: RIGHT FOOT - 2 VIEW COMPARISON:  None. FINDINGS: Gas within the plantar soft tissues of the foot noted highly suspicious for soft tissue infection. No radiographic evidence of acute osteomyelitis noted. Heavy vascular calcifications are present. No acute fracture or dislocation  noted. Chronic changes of the 2nd toe proximal phalanx noted. IMPRESSION: Gas within the plantar soft tissues of the foot highly suspicious for soft tissue infection. No radiographic evidence of acute osteomyelitis. Consider MRI as indicated. Electronically Signed   By: Margarette Canada M.D.   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 Unable to image right arm past subclavian. Comparison Study: No previous exams Performing Technologist: Rogelia Rohrer  Examination Guidelines: A complete evaluation includes B-mode imaging, spectral Doppler, color Doppler, and power Doppler as needed of all accessible portions of each vessel. Bilateral testing is considered an integral part of a complete examination. Limited examinations for reoccurring indications may be performed as noted.  Right Findings: +----------+------------+---------+-----------+----------+-------+ RIGHT     CompressiblePhasicitySpontaneousPropertiesSummary +----------+------------+---------+-----------+----------+-------+ IJV           Full       Yes       Yes                      +----------+------------+---------+-----------+----------+-------+ Subclavian  Partial      No        No                Acute  +----------+------------+---------+-----------+----------+-------+ Difficult to image due to patient being contracted.  Left Findings:  +----------+------------+---------+-----------+----------+--------------------+ LEFT      CompressiblePhasicitySpontaneousProperties      Summary        +----------+------------+---------+-----------+----------+--------------------+ IJV           Full       Yes       Yes                                   +----------+------------+---------+-----------+----------+--------------------+  Subclavian    Full       Yes       Yes                                   +----------+------------+---------+-----------+----------+--------------------+ Axillary      Full       Yes       Yes                                   +----------+------------+---------+-----------+----------+--------------------+ Brachial      Full       Yes       Yes              Not well visualized  +----------+------------+---------+-----------+----------+--------------------+ Radial                                                   Not seen -                                                               contracted      +----------+------------+---------+-----------+----------+--------------------+ Ulnar                                                Not seen - severe                                                              edema         +----------+------------+---------+-----------+----------+--------------------+ Cephalic                                             Not seen - severe                                                              edema         +----------+------------+---------+-----------+----------+--------------------+ Basilic       Full       Yes       Yes                                   +----------+------------+---------+-----------+----------+--------------------+  Summary:  Right: Findings consistent with acute deep vein thrombosis involving the right subclavian vein.  Left: No evidence of deep vein thrombosis in the upper extremity  in areas imaged.  No evidence of superficial vein thrombosis in the upper extremity in areas imaged.  *See table(s) above for measurements and observations.  Diagnosing physician: Ruta Hinds MD Electronically signed by Ruta Hinds MD on 07/22/2020 at 5:33:47 PM.    Final    US Abdomen Limited RUQ (LIVER/GB)  Result Date: 07/18/2020 CLINICAL DATA:  Elevated LFTs EXAM: ULTRASOUND ABDOMEN LIMITED RIGHT UPPER QUADRANT COMPARISON:  None. FINDINGS: Gallbladder: There are few hypoechoic nonshadowing foci within the gallbladder which do not demonstrate internal vascular flow measuring up to 2.5 cm, likely representing tumefactive sludge. No gallstones or wall thickening visualized. No sonographic Murphy sign noted by sonographer. Common bile duct: Diameter: 5 mm Liver: No focal lesion identified. Within normal limits in parenchymal echogenicity. Portal vein is patent on color Doppler imaging with normal direction of blood flow towards the liver. Other: None. IMPRESSION: 1. Probable tumefactive sludge within the gallbladder. No cholelithiasis or sonographic evidence of cholecystitis. 2. Unremarkable sonographic appearance of the liver. Electronically Signed   By: Dahlia Bailiff MD   On: 07/18/2020 08:56     Results/Tests Pending at Time of Discharge: none  Discharge Medications:  Allergies as of 07/27/2020   No Known Allergies     Medication List    STOP taking these medications   lisinopril-hydrochlorothiazide 20-25 MG tablet Commonly known as: ZESTORETIC   metoprolol tartrate 100 MG tablet Commonly known as: LOPRESSOR   simvastatin 40 MG tablet Commonly known as: ZOCOR     TAKE these medications   (feeding supplement) PROSource Plus liquid Take 30 mLs by mouth 3 (three) times daily between meals.   apixaban 5 MG Tabs tablet Commonly known as: ELIQUIS Take 1 tablet (5 mg total) by mouth 2 (two) times daily. Until 5/16   apixaban 2.5 MG Tabs tablet Commonly known as: ELIQUIS Take 1 tablet (2.5 mg total)  by mouth 2 (two) times daily. Starting 5/17 Start taking on: Aug 02, 2020   ascorbic acid 1000 MG tablet Commonly known as: VITAMIN C Take 1 tablet (1,000 mg total) by mouth daily. Start taking on: Jul 28, 2020   aspirin EC 81 MG tablet Take 81 mg by mouth at bedtime. Swallow whole.   Gerhardt's butt cream Crea Apply 1 application topically 3 (three) times daily.   potassium chloride 10 MEQ tablet Commonly known as: KLOR-CON Take 1 tablet (10 mEq total) by mouth daily. What changed:   medication strength  See the new instructions.   rosuvastatin 20 MG tablet Commonly known as: Crestor Take 1 tablet (20 mg total) by mouth daily.            Discharge Care Instructions  (From admission, onward)         Start     Ordered   07/27/20 0000  Discharge wound care:       Comments: Change dressing to amputation stump daily May cleanse with antibacterial soap and water may get wet in the shower but no soaking   07/27/20 1221          Discharge Instructions: Please refer to Patient Instructions section of EMR for full details.  Patient was counseled important signs and symptoms that should prompt return to medical care, changes in medications, dietary instructions, activity restrictions, and follow up appointments.   Follow-Up Appointments:  Contact information for follow-up providers    Persons, Bevely Palmer, [place] In 1 week.   Specialty: Orthopedic Surgery Contact information: [address] [place] [address] [phone]  Contact information for after-discharge care    Destination    [facility] Preferred SNF .   Service: Skilled Nursing Contact information: [address] [place], [place], MD 07/27/2020, 12:21 PM PGY-1, [place]

## 2020-07-27 NOTE — Progress Notes (Signed)
Calorie Count Note  48 hour calorie count ordered.  Diet: dysphagia 2 diet with thin liquids Supplements: 30 ml Prosource Plus TID, each supplement provides 100 kcals and 15 grams protein; Boost Breeze po TID, each supplement provides 250 kcal and 9 grams of protein  5/9 Breakfast: 674 kcals, 28 grams protein Lunch: 37 kcals, 2 grams protein Dinner: 247 kcals, 9 grams protein  Total intake: 958 kcal (60% of minimum estimated needs)  39 protein (46% of minimum estimated needs)  5/10 Breakfast: 247 kcals, 9 grams protein Lunch: 0 kcals, 0 grams protein Dinner: nothing documented Supplements: 2 Prosource Plus (200 kcals, 30 grams protein), 2 Boost Breeze (500 kcals, 18 grams protein)  Total intake: 947 kcal (59% of minimum estimated needs)  57 grams protein (67% of minimum estimated needs)  Average Total intake: 953 kcal (60% of minimum estimated needs)  48 grams protein (56% of minimum estimated needs)  Nutrition Dx: Severe Malnutritionrelated to chronic illness,wound healingas evidenced by severe fat depletion,severe muscle depletion,percent weight loss; ongoing  Goal: Patient will meet greater than or equal to 90% of their needs; unmet  Intervention:   -D/c calorie count -Continue Boost Breeze po TID, each supplement provides 250 kcal and 9 grams of protein -Continue 30 ml Prosource Plus TID, each supplement provides 100 kcals and 15 grams protein -MVI with minerals daily   Loistine Chance, RD, LDN, [place] Registered Dietitian II Certified Diabetes Care and Education Specialist Please refer to [facility] for RD and/or RD on-call/weekend/after hours pager

## 2020-07-27 NOTE — Progress Notes (Signed)
Family Medicine Teaching Service Daily Progress Note Intern Pager: [phone]  Patient name: Rick Schwartz Medical record number: [id number] Date of birth: [birth year] Age: 80 y.o. Gender: male  Primary Care Provider: Matilde Haymaker, MD Consultants: Orthopedics, palliative care   Code Status: DNR  Pt Overview and Major Events to Date:  05/01: Admitted to [facility] 05/05: Rapid Response Called for Sepsis 05/06: Transfer of care to [facility] 05/06: AKA surgery with placement of wound VAC 05/11: wound VAC removed  Assessment and Plan: ADRIANA LINA is a 80 y.o. 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 PMHx significant for CP, PAD, HTN.  Medically stable for discharge to SNF when bed available.  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 BP improving, normotensive to soft. Afebrile since 5/5. Leukocytosis improved, WBC down to 10.7 today.  Transition to oral antibiotics yesterday. - orthopedics following, appreciate recommendations - amoxicillin-clavulanate (5/10-), plan to continue until 5/11 - s/p piperacillin-tazobactam (5/5-5/9) - s/p vancomycin (5/1-5/7) - s/p cefepime (5/1, 5/5) - s/p CTX (5/1-5/3)  Acute hypoxemic respiratory failure Resolved. Due to presumed aspiration pneumonia noted 5/5 on CXR during rapid response from aspiration event. Weaned to room air yesterday evening. Repeat CXR yesterday with pneumonia vs atelectasis in the left lung base. - antibiotics as above - aspiration precautions  Goals of care Poor prognosis given frailty and poor oral intake. Plan to continue current treatment however if condition declines do not escalate treatment and will switch to comfort care measures.  Plan for outpatient palliative care follow up on discharge at SNF, likely will not be able to go to SNF with hospice at this time due to insurance reasons.  - palliative care following,  appreciate involvement - DNR - CMO if declining, will not transfer to ICU if indicated per family wishes  Right subclavian vein DVT On lower dose apixaban per clinical judgment given age and low BMI. - apixaban 5 mg BID x 7 days, then 2.5 mg BID  Hypokalemia Resolved with repletion yesterday, K 3.7 today.  Severe protein calorie malnutrition In the setting of severe illness, most recent albumin 1.2.  Per 48-hour calorie count, intake about 60% of minimal caloric needs. - RD consulted - nutritional supplements per RD  HTN Has been hypotensive to soft BP.  Continue holding home antihypertensives.  FEN/GI: DYS 2 PPx: SCDs  Disposition: progressive, medically stable for SNF when bed available  Subjective:  NAOE.  Weaned to room air yesterday evening.  Breakfast at bedside, patient planning to eat a little bit later.  Objective: Temp:  [97.4 F (36.3 C)-98.9 F (37.2 C)] 98.8 F (37.1 C) (05/11 0312) Pulse Rate:  [83-96] 96 (05/11 0312) Resp:  [18-26] 20 (05/11 0312) BP: (93-120)/(48-71) 93/52 (05/11 0312) SpO2:  [94 %-100 %] 95 % (05/11 0312) Weight:  [50 kg] 50 kg (05/11 0300) Physical Exam: General: Thin elderly male resting in bed comfortably, NAD Cardiovascular: RRR, no murmurs Respiratory: Decreased breath sounds left lower lung field, otherwise CTAB, breathing comfortably on room air Abdomen: Soft, nontender, positive bowel sounds Extremities: Warm and well perfused, no edema left lower extremity, SCDs in place on left lower extremity, right AKA site bandaged and CDI, left upper extremity contracted, no right upper forearm edema (resolved)  Laboratory: Recent Labs  Lab 07/25/20 0755 07/26/20 0400 07/27/20 0250  WBC 12.9* 13.5* 10.7*  HGB 9.9* 9.6* 9.8*  HCT 30.1* 30.4* 30.5*  PLT 215 212 228   Recent Labs  Lab 07/22/20 0605 07/23/20 0157 07/24/20 0201 07/25/20 0755 07/26/20 0400 07/27/20 0250  NA 138 137   < > 139 139 140  K 3.1* 3.6   < > 3.1* 3.0*  3.7  CL 107 105   < > 110 110 112*  CO2 25 21*   < > 23 24 23   BUN 19 11   < > 12 12 9   CREATININE 0.69 0.51*   < > 0.47* 0.46* 0.46*  CALCIUM 8.0* 7.4*   < > 7.6* 7.6* 7.8*  PROT 4.5* 4.6*  --   --  4.5*  --   BILITOT 0.8 0.7  --   --  0.5  --   ALKPHOS 98 77  --   --  92  --   ALT 105* 66*  --   --  37  --   AST 114* 101*  --   --  35  --   GLUCOSE 141* 80   < > 91 88 160*   < > = values in this interval not displayed.      Imaging/Diagnostic Tests: DG Chest 1 View  Result Date: 07/26/2020 CLINICAL DATA:  Vomiting and leukocytosis. EXAM: CHEST  1 VIEW COMPARISON:  None. FINDINGS: AP semiupright view of the chest shows low volumes with asymmetric elevation of the right hemidiaphragm. Patient's right arm obscures the right lung. There is hazy airspace disease in the left base with left pleural effusion and some tethering or tenting of the major fissure. Heart size normal. Diffuse gaseous distention of the stomach and colon noted under the hemidiaphragms. IMPRESSION: 1. Low lung volumes with hazy atelectasis or pneumonia in the left base and small left pleural effusion. 2. Diffuse gaseous distention of the stomach and colon. Electronically Signed   By: Misty Stanley M.D.   On: 07/26/2020 13:45   DG Abd 1 View  Result Date: 07/26/2020 CLINICAL DATA:  Vomiting and leukocytosis EXAM: ABDOMEN - 1 VIEW COMPARISON:  Jul 22, 2020 FINDINGS: There is no appreciable bowel dilatation or air-fluid level to suggest bowel obstruction. No evident free air. 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 There are foci of aortic and iliac artery atherosclerotic calcification. Degenerative change noted in the lumbar spine. IMPRESSION: No bowel obstruction or free air.  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 Aortic Atherosclerosis (ICD10-I70.0). Electronically Signed   By: Lowella Grip III M.D.   On: 07/26/2020 13:44     Zola Button, MD 07/27/2020, 7:24 AM PGY-1, [facility] Intern pager:  [phone], text pages welcome

## 2020-07-27 NOTE — Plan of Care (Signed)

## 2020-07-27 NOTE — Care Management Important Message (Signed)
Important Message  Patient Details  Name: Rick Schwartz MRN: [id number] Date of Birth: [birth year]   Medicare Important Message Given:  Yes     Shelda Altes 07/27/2020, 10:56 AM

## 2020-07-27 NOTE — Plan of Care (Signed)
  Problem: Health Behavior/Discharge Planning: Goal: Ability to manage health-related needs will improve Outcome: Adequate for Discharge   Problem: Clinical Measurements: Goal: Ability to maintain clinical measurements within normal limits will improve Outcome: Adequate for Discharge Goal: Will remain free from infection Outcome: Adequate for Discharge Goal: Diagnostic test results will improve Outcome: Adequate for Discharge Goal: Respiratory complications will improve Outcome: Adequate for Discharge Goal: Cardiovascular complication will be avoided Outcome: Adequate for Discharge   Problem: Activity: Goal: Risk for activity intolerance will decrease Outcome: Adequate for Discharge   

## 2020-08-01 ENCOUNTER — Ambulatory Visit (INDEPENDENT_AMBULATORY_CARE_PROVIDER_SITE_OTHER): Payer: Medicare Other | Admitting: Family

## 2020-08-01 ENCOUNTER — Encounter: Payer: Self-pay | Admitting: Family

## 2020-08-01 VITALS — Ht 66.0 in | Wt 110.0 lb

## 2020-08-01 DIAGNOSIS — Z89611 Acquired absence of right leg above knee: Secondary | ICD-10-CM

## 2020-08-01 NOTE — Progress Notes (Signed)
   Post-Op Visit Note   Patient: Rick Schwartz           Date of Birth: 07-11-1940           MRN: [id number] Visit Date: 08/01/2020 PCP: Matilde Haymaker, MD  Chief Complaint:  Chief Complaint  Patient presents with  . 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  Ortho Exam Incision well approximated sutures and staples is healing well there is no gaping no drainage no erythema no sign of infection  Visit Diagnoses: No diagnosis found.  Plan: Begin daily Dial soap cleansing.  Dry dressing changes.  Follow-up in 1 week for suture and staple removal.  Follow-Up Instructions: Return in about 1 week (around 08/08/2020).   Imaging: No results found.  Orders:  No orders of the defined types were placed in this encounter.  No orders of the defined types were placed in this encounter.    PMFS History: Patient Active Problem List   Diagnosis Date Noted  . SBO (small bowel obstruction) ([place])   . Inanition ([place])   . Foot ulcer with necrosis of muscle, right ([place]) 07/18/2020  . Severe sepsis ([place]) 07/18/2020  . Lactic acidosis 07/18/2020  . Hypernatremia 07/18/2020  . Hypercalcemia 07/18/2020  . Severe protein-calorie malnutrition ([place])   . Gangrene of right foot ([place]) 07/17/2020  . Focal motor deficit 07/07/2020  . Physical deconditioning 05/06/2020  . Prediabetes 07/20/2016  . Cough 01/21/2012  . 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 GLUCOSE INTOLERANCE 05/16/2006  . HYPERCHOLESTEROLEMIA 05/16/2006  . Infantile cerebral palsy ([place]) 05/16/2006  . HYPERTENSION, BENIGN SYSTEMIC 05/16/2006  . BPH 05/16/2006   Past Medical History:  Diagnosis Date  . Cancer [facility])    Prostate  . Cerebral palsy ([place])   . Hyperlipidemia   . Hypertension   . Peripheral  arterial disease (HCC)    critical limb ischemia    Family History  Problem Relation Age of Onset  . Diabetes Mother   . Hypertension Mother   . Hypertension Father   . Diabetes Sister   . Hypertension Sister   . Diabetes Brother   . Hypertension Brother   . Diabetes Sister   . Thyroid disease Sister   . Sudden death Brother     Past Surgical History:  Procedure Laterality Date  . AMPUTATION Right 07/22/2020   Procedure: RIGHT ABOVE KNEE AMPUTATION;  Surgeon: [name], MD;  Location: [facility];  Service: Orthopedics;  Laterality: Right;  . PROSTATE SURGERY     Social History   Occupational History  . Not on file  Tobacco Use  . Smoking status: Former Smoker    Packs/day: 0.25    Years: 35.00    Pack years: 8.75    Quit date: 10/12/1994    Years since quitting: 25.8  . Smokeless tobacco: Never Used  Vaping Use  . Vaping Use: Never used  Substance and Sexual Activity  . Alcohol use: Yes    Comment: Occasional beer and gin  . Drug use: No  . Sexual activity: Never

## 2020-08-02 ENCOUNTER — Ambulatory Visit: Payer: Medicare Other | Admitting: Diagnostic Neuroimaging

## 2020-08-02 ENCOUNTER — Other Ambulatory Visit: Payer: Self-pay

## 2020-08-02 ENCOUNTER — Non-Acute Institutional Stay: Payer: Medicare Other | Admitting: Hospice

## 2020-08-02 DIAGNOSIS — S78111A Complete traumatic amputation at level between right hip and knee, initial encounter: Secondary | ICD-10-CM

## 2020-08-02 DIAGNOSIS — Z515 Encounter for palliative care: Secondary | ICD-10-CM

## 2020-08-02 DIAGNOSIS — R1310 Dysphagia, unspecified: Secondary | ICD-10-CM

## 2020-08-02 NOTE — Progress Notes (Signed)
Therapist, nutritional Palliative Care Consult Note Telephone: [phone]  Fax: [phone]  PATIENT NAME: BRENTT Schwartz [address] [place] [address] [phone] (home)  DOB: 13-Dec-1940 MRN: [id number]  PRIMARY CARE PROVIDER:    Mirian Mo, MD,  [address] [place] [address] [phone]  REFERRING PROVIDER:   Mirian Mo, MD [address] [place],  [place] [address] [phone]  RESPONSIBLE PARTY:   Self Contact Information    Name Relation Home Work Mobile   [place] Sister [phone]     [facility] Sister [phone]     Berneta Sages   [phone]       I met face to face with patient and family at home or facility. Palliative Care was asked to follow this patient by consultation request of [place] Miller, NP to address advance care planning, complex medical decision making and goals of care clarification.  NP met with Orvilla Fus and [name] at the facility and updated them on patient's status. This is the initial visit.    ASSESSMENT AND / RECOMMENDATIONS:   Advance Care Planning: Our advance care planning conversation included a discussion about:     The value and importance of advance care planning   Difference between Hospice and Palliative care  Exploration of goals of care in the event of a sudden injury or illness   Identification and preparation of a healthcare agent   Review and updating or creation of an  advance directive document .  CODE STATUS: CODE STATUS discussion.  Patient confirmed he is a DO NOT RESUSCITATE.  Signed DNR in facility records; same document uploaded to epic today.  Goals of Care: Goals include to maximize quality of life and symptom management.  Patient/family open to further discussion on goals of care clarification. Family aware of patient's poor prognosis given reduced oral intake and risk of aspiration.   I spent 46 minutes providing this initial consultation. More  than 50% of the time in this consultation was spent on counseling patient and coordinating communication. --------------------------------------------------------------------------------------------------------------------------------------  Symptom Management/Plan: Right AKA: SNF for acute rehab.  Wound nurse following surgical wound, unremarkable.  Routine CBC BMP Pain: Patient denies pain at this time.  Tylenol 500 mg 3 times daily as needed for pain.  Gabapentin 100 mg twice daily as needed for neuropathic pain.  May titrate gabapentin up as needed. Dysphagia: Patient placed on dysphagia 2 diet while in hospital.  Continue dysphagia 2 diet.  Aspiration precautions.  ST consult as needed. Right subclavian DVT: Continue Eliquis 5 mg twice daily daily Follow up: Palliative care will continue to follow for complex medical decision making, advance care planning, and clarification of goals. Return 6 weeks or prn.Encouraged to call provider sooner with any concerns.   Family /Caregiver/Community Supports: Patient in SNF for ongoing care  PPS: 30%  HOSPICE ELIGIBILITY/DIAGNOSIS: TBD  Chief Complaint: Initial Palliative care visit:  HISTORY OF PRESENT ILLNESS:  Rick Schwartz is a 80 y.o. year old male  with multiple medical conditions including acute right AKA related to right foot ulcer with necrosis/gangrene. Hospitalization 5/1- 07/27/2020 for sepsis secondary to gangrene of the right lower extremity resulting in above-the-knee right amputation.  Amputation procedure and placement of wound VAC 07/23/2019; wound VAC removed 07/28/2019.  Right AKA impairs patient's mobility/independence and quality of life.  Ongoing PT OT is helpful.  History of PVD, dysphagia cerebral palsy, severe protein caloric malnutrition, History obtained from review of EMR, discussion with primary team, caregiver, family  and/or Mr. Grandville Silos.  Review and summarization of Epic records shows history from other than patient. Rest of  10 point ROS asked and negative.     Review of lab tests/diagnostics   Recent Labs  Lab 07/27/20 0250  WBC 10.7*  HGB 9.8*  HCT 30.5*  PLT 228  MCV 95.6   Recent Labs  Lab 07/27/20 0250  NA 140  K 3.7  CL 112*  CO2 23  BUN 9  CREATININE 0.46*  GLUCOSE 160*   ROS EYES: denies vision changes ENMT: Endorses occasional dysphagia Cardiovascular: denies chest pain/discomfort Pulmonary: denies cough, denies SOB Abdomen: endorses good appetite, denies constipation, endorses continence of bowel GU: denies dysuria, urinary frequency MSK: Endorses weakness,  no falls reported Skin: denies rashes or wounds Neurological: denies pain, denies insomnia Psych: Endorses positive mood Heme/lymph/immuno: denies bruises, abnormal bleeding  Physical Exam: Constitutional: NAD, resting in bed, cooperative General: Well groomed EYES: anicteric sclera, lids intact, no discharge  ENMT: Moist mucous membrane CV: S1 S2, RRR, no LE edema Pulmonary: LCTA, no increased work of breathing, no cough, Abdomen: active BS + 4 quadrants, soft and non tender GU: no suprapubic tenderness MSK: weakness, sarcopenia, right AKA, dressing to surgical site clean dry and intact, no swelling no redness no warmth; contractures to bilateral hands Skin: warm and dry, no rashes or wounds on visible skin Neuro:  weakness, otherwise non focal Psych: non-anxious affect Hem/lymph/immuno: no widespread bruising   PAST MEDICAL HISTORY:  Active Ambulatory Problems    Diagnosis Date Noted  . 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 GLUCOSE INTOLERANCE 05/16/2006  . HYPERCHOLESTEROLEMIA 05/16/2006  . Infantile cerebral palsy ([place]) 05/16/2006  . HYPERTENSION, BENIGN SYSTEMIC 05/16/2006  . BPH 05/16/2006  . 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 Cough 01/21/2012  . Prediabetes 07/20/2016  . Physical deconditioning 05/06/2020  . Focal motor deficit 07/07/2020  . Gangrene of right foot ([place]) 07/17/2020  . Foot ulcer with  necrosis of muscle, right ([place]) 07/18/2020  . Severe sepsis ([place]) 07/18/2020  . Lactic acidosis 07/18/2020  . Hypernatremia 07/18/2020  . Hypercalcemia 07/18/2020  . Severe protein-calorie malnutrition ([place])   . SBO (small bowel obstruction) ([place])   . Inanition [facility])    Resolved Ambulatory Problems    Diagnosis Date Noted  . LIPOMA, [place] 05/02/2007  . HYPOKALEMIA 05/16/2006  . PEDAL EDEMA 01/02/2007  . CERUMEN IMPACTION, BILATERAL 04/11/2010  . Laceration of skin of face 09/12/2010  . Healthcare maintenance 03/09/2011  . Abnormal colonoscopy 12/27/2013  . Gait instability 01/25/2014  . Toe ulcer, right ([place]) 04/26/2015  . Leg edema 10/10/2018   Past Medical History:  Diagnosis Date  . Cancer ([place])   . Cerebral palsy ([place])   . Hyperlipidemia   . Hypertension   . Peripheral arterial disease ([place])     SOCIAL HX:  Social History   Tobacco Use  . Smoking status: Former Smoker    Packs/day: 0.25    Years: 35.00    Pack years: 8.75    Quit date: 10/12/1994    Years since quitting: 25.8  . Smokeless tobacco: Never Used  Substance Use Topics  . Alcohol use: Yes    Comment: Occasional beer and gin     FAMILY HX:  Family History  Problem Relation Age of Onset  . Diabetes Mother   . Hypertension Mother   . Hypertension Father   . Diabetes Sister   . Hypertension Sister   . Diabetes Brother   . Hypertension Brother   . Diabetes  Sister   . Thyroid disease Sister   . Sudden death Brother       ALLERGIES: No Known Allergies    PERTINENT MEDICATIONS:  Outpatient Encounter Medications as of 08/02/2020  Medication Sig  . apixaban (ELIQUIS) 2.5 MG TABS tablet Take 1 tablet (2.5 mg total) by mouth 2 (two) times daily. Starting 5/17  . apixaban (ELIQUIS) 5 MG TABS tablet Take 1 tablet (5 mg total) by mouth 2 (two) times daily. Until 5/16  . ascorbic acid (VITAMIN C) 1000 MG tablet Take 1 tablet (1,000 mg total) by mouth daily.  [name] aspirin EC 81 MG tablet Take 81 mg by  mouth at bedtime. Swallow whole.  . Nutritional Supplements (,FEEDING SUPPLEMENT, PROSOURCE PLUS) liquid Take 30 mLs by mouth 3 (three) times daily between meals.  . Nystatin (GERHARDT'S BUTT CREAM) CREA Apply 1 application topically 3 (three) times daily.  . potassium chloride SA (KLOR-CON) 10 MEQ tablet Take 1 tablet (10 mEq total) by mouth daily.  . rosuvastatin (CRESTOR) 20 MG tablet Take 1 tablet (20 mg total) by mouth daily.   No facility-administered encounter medications on file as of 08/02/2020.   Thank you for the opportunity to participate in the care of Mr. Grandville Silos.  The palliative care team will continue to follow. Please call our office at [phone] if we can be of additional assistance.   Note: Portions of this note were generated with Lobbyist. Dictation errors may occur despite best attempts at proofreading.  Teodoro Spray, NP

## 2020-08-05 ENCOUNTER — Other Ambulatory Visit: Payer: Medicare Other

## 2020-08-08 ENCOUNTER — Ambulatory Visit (HOSPITAL_COMMUNITY): Payer: Medicare Other

## 2020-08-09 ENCOUNTER — Ambulatory Visit: Payer: Medicare Other | Admitting: Family

## 2020-08-10 ENCOUNTER — Ambulatory Visit (INDEPENDENT_AMBULATORY_CARE_PROVIDER_SITE_OTHER): Payer: Medicare Other | Admitting: Family

## 2020-08-10 ENCOUNTER — Encounter: Payer: Self-pay | Admitting: Family

## 2020-08-10 DIAGNOSIS — Z89611 Acquired absence of right leg above knee: Secondary | ICD-10-CM

## 2020-08-10 NOTE — Progress Notes (Signed)
Patient is currently at [facility]. He is accompanied today by his sister Newell Coral.  Well approximated incision with stapes and stitches intact. No reports of pain. EDD 08/19/2020 and pt will move into sister's home. She does have a single story dwelling with 2 steps to front porch of home. Mardene Celeste states that the pt does have all the DME he needs at home including a wheelchair, tub bench and bedside commode. His PCP is Dr. Waymon Amato. Pt will not require shrinker and is not seeking prosthetic. Dicussed transportation once d/c from facility and sister would like to pursue [facility] transportation services. Advised that I will contact facility social worker for coordination of care and to call me with any questions. Pt will be using the [facility] upon discharge.  Morganna Styles, [place], [employer]

## 2020-08-10 NOTE — Progress Notes (Signed)
   Post-Op Visit Note   Patient: Rick Schwartz           Date of Birth: October 21, 1940           MRN: [id number] Visit Date: 08/10/2020 PCP: Matilde Haymaker, MD  Chief Complaint:  Chief Complaint  Patient presents with  . Right Leg - Routine Post Op    07/22/2020 right AKA.    HPI:  HPI Patient is a 80 year old gentleman seen status post right above-knee amputation.  His wife accompanies the visit he is residing at skilled nursing. Ortho Exam Sutures and staples are in place the incision is well-healed there is no erythema no drainage no gaping limb consolidating well.  Visit Diagnoses: No diagnosis found.  Plan: Sutures and staples harvested today without incident continue daily dose of cleansing dry dressing changes until healed the patient will follow up in office in 4 weeks  Follow-Up Instructions: No follow-ups on file.   Imaging: No results found.  Orders:  No orders of the defined types were placed in this encounter.  No orders of the defined types were placed in this encounter.    PMFS History: Patient Active Problem List   Diagnosis Date Noted  . SBO (small bowel obstruction) ([place])   . Inanition ([place])   . Foot ulcer with necrosis of muscle, right ([place]) 07/18/2020  . Severe sepsis ([place]) 07/18/2020  . Lactic acidosis 07/18/2020  . Hypernatremia 07/18/2020  . Hypercalcemia 07/18/2020  . Severe protein-calorie malnutrition ([place])   . Gangrene of right foot ([place]) 07/17/2020  . Focal motor deficit 07/07/2020  . Physical deconditioning 05/06/2020  . Prediabetes 07/20/2016  . Cough 01/21/2012  . 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 GLUCOSE INTOLERANCE 05/16/2006  . HYPERCHOLESTEROLEMIA 05/16/2006  . Infantile cerebral palsy ([place]) 05/16/2006  . HYPERTENSION, BENIGN SYSTEMIC 05/16/2006  . BPH 05/16/2006   Past Medical History:  Diagnosis Date  . Cancer [facility])    Prostate  . Cerebral palsy ([place])   . Hyperlipidemia   . Hypertension    . Peripheral arterial disease (HCC)    critical limb ischemia    Family History  Problem Relation Age of Onset  . Diabetes Mother   . Hypertension Mother   . Hypertension Father   . Diabetes Sister   . Hypertension Sister   . Diabetes Brother   . Hypertension Brother   . Diabetes Sister   . Thyroid disease Sister   . Sudden death Brother     Past Surgical History:  Procedure Laterality Date  . AMPUTATION Right 07/22/2020   Procedure: RIGHT ABOVE KNEE AMPUTATION;  Surgeon: [name], MD;  Location: [facility];  Service: Orthopedics;  Laterality: Right;  . PROSTATE SURGERY     Social History   Occupational History  . Not on file  Tobacco Use  . Smoking status: Former Smoker    Packs/day: 0.25    Years: 35.00    Pack years: 8.75    Quit date: 10/12/1994    Years since quitting: 25.8  . Smokeless tobacco: Never Used  Vaping Use  . Vaping Use: Never used  Substance and Sexual Activity  . Alcohol use: Yes    Comment: Occasional beer and gin  . Drug use: No  . Sexual activity: Never

## 2020-08-30 ENCOUNTER — Telehealth: Payer: Self-pay

## 2020-08-30 NOTE — Telephone Encounter (Signed)
Rick Schwartz [facility] PT calls nurse line requesting verbal orders as follows.  Nursing evaluation for wound infection.   OT evaluation for ADLs.  All verbals given per [facility] protocol.

## 2020-09-05 ENCOUNTER — Telehealth: Payer: Self-pay

## 2020-09-05 NOTE — Telephone Encounter (Signed)
[name] from [place] calling for PT verbal orders as follows:  1 time(s) weekly for 1 week(s), then 2 time(s) weekly for 1 week(s), then 1 time weekly for 3 weeks.   PT also requesting medical social worker evaluation.   Verbal orders given per [facility] protocol  Talbot Grumbling, RN

## 2020-09-05 NOTE — Telephone Encounter (Signed)
[facility] OT calls nurse line requesting verbal orders for [facility] OT as follows:   2x a week for 4 weeks.  Verbal order given per [facility] protocol.

## 2020-09-07 ENCOUNTER — Ambulatory Visit (INDEPENDENT_AMBULATORY_CARE_PROVIDER_SITE_OTHER): Payer: Medicare Other | Admitting: Physician Assistant

## 2020-09-07 ENCOUNTER — Other Ambulatory Visit: Payer: Self-pay

## 2020-09-07 ENCOUNTER — Encounter: Payer: Self-pay | Admitting: Physician Assistant

## 2020-09-07 DIAGNOSIS — Z89611 Acquired absence of right leg above knee: Secondary | ICD-10-CM

## 2020-09-07 NOTE — Progress Notes (Signed)
Office Visit Note   Patient: Rick Schwartz           Date of Birth: 03/16/1941           MRN: [id number] Visit Date: 09/07/2020              Requested by: Matilde Haymaker, MD [address] [place],  [place] [address] PCP: Matilde Haymaker, MD  No chief complaint on file.     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  He is accompanied by his sister and his niece.  He is currently at home being cared for by his family.  His family feels he is doing well his sister is inquiring why he needs to be on Eliquis  Assessment & Plan: Visit Diagnoses: No diagnosis found.  Plan: Patient may follow-up as needed.  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  I told him with regards to how long he is on this they should contact the primary care provider.  His sister says he has not taken aspirin in 10 years and this very well may have been a historical medication.  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  If they notice any skin breakdown at all in this area they are to contact us immediately otherwise she will follow-up as needed  Follow-Up Instructions: No follow-ups on file.   Ortho Exam  Patient is alert, oriented, no adenopathy, well-dressed, normal affect, normal respiratory effort. Well-healed amputation stump.  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  However the incision is completely healed no erythema no ascending cellulitis no wound dehiscence.  Imaging: No results found. No images are attached to the encounter.  Labs: Lab Results  Component Value Date   HGBA1C 5.6 09/29/2019   HGBA1C 6.0 06/10/2017   HGBA1C 6.4 07/20/2016   ESRSEDRATE 80 (H) 07/18/2020   CRP 14.8 (H) 07/18/2020   LABURIC 3.4  (L) 07/21/2020   REPTSTATUS 07/22/2020 FINAL 07/17/2020   REPTSTATUS 07/22/2020 FINAL 07/17/2020   CULT  07/17/2020    NO GROWTH 5 DAYS Performed at [facility], [place] [address], [place], [place] [address]    CULT  07/17/2020    NO GROWTH 5 DAYS Performed at [facility] [address] [place], [place] [address]      Lab Results  Component Value Date   ALBUMIN 1.2 (L) 07/26/2020   ALBUMIN 1.3 (L) 07/23/2020   ALBUMIN 1.0 (L) 07/22/2020    Lab Results  Component Value Date   MG 1.7 07/21/2020   MG 1.9 07/20/2020   MG 1.6 (L) 07/19/2020   No results found for: VD25OH  No results found for: PREALBUMIN CBC EXTENDED Latest Ref Rng & Units 07/27/2020 07/26/2020 07/25/2020  WBC 4.0 - 10.5 K/uL 10.7(H) 13.5(H) 12.9(H)  RBC 4.22 - 5.81 MIL/uL 3.19(L) 3.16(L) 3.23(L)  HGB 13.0 - 17.0 g/dL 9.8(L) 9.6(L) 9.9(L)  HCT 39.0 - 52.0 % 30.5(L) 30.4(L) 30.1(L)  PLT 150 - 400 K/uL 228 212 215  NEUTROABS 1.7 - 7.7 K/uL 9.5(H) 12.4(H) 11.7(H)  LYMPHSABS 0.7 - 4.0 K/uL 0.6(L) 0.5(L) 0.6(L)     There is no height or weight on file to calculate BMI.  Orders:  No orders of the defined types were placed in this encounter.  No orders of the defined types were placed in this encounter.    Procedures: No procedures performed  Clinical Data: No additional findings.  ROS:  All other systems negative, except as noted in the HPI. Review of Systems  Objective: Vital Signs: There were no vitals taken for this visit.  Specialty Comments:  No specialty comments available.  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   GLUCOSE INTOLERANCE 05/16/2006   HYPERCHOLESTEROLEMIA 05/16/2006   Infantile cerebral palsy ([place]) 05/16/2006   HYPERTENSION, BENIGN SYSTEMIC 05/16/2006   BPH 05/16/2006   Past Medical History:  Diagnosis Date   Cancer ([place])    Prostate   Cerebral palsy ([place])    Hyperlipidemia    Hypertension    Peripheral arterial disease ([place])    critical limb ischemia    Family History  Problem Relation Age of Onset   Diabetes Mother    Hypertension Mother    Hypertension Father    Diabetes Sister    Hypertension Sister    Diabetes Brother    Hypertension Brother    Diabetes Sister    Thyroid disease Sister    Sudden death Brother     Past Surgical History:  Procedure Laterality Date   AMPUTATION Right 07/22/2020   Procedure: RIGHT ABOVE KNEE AMPUTATION;  Surgeon: [name], MD;  Location: [facility];  Service: Orthopedics;  Laterality: Right;   PROSTATE SURGERY     Social History   Occupational History   Not on file  Tobacco Use   Smoking status: Former    Packs/day: 0.25    Years: 35.00    Pack years: 8.75    Types: Cigarettes    Quit date: 10/12/1994    Years since quitting: 25.9   Smokeless tobacco: Never  Vaping Use   Vaping Use: Never used  Substance and Sexual Activity   Alcohol use: Yes    Comment: Occasional beer and gin   Drug use: No   Sexual activity: Never

## 2020-09-20 ENCOUNTER — Encounter (HOSPITAL_COMMUNITY): Payer: Self-pay | Admitting: Radiology

## 2020-09-20 ENCOUNTER — Emergency Department (HOSPITAL_COMMUNITY): Payer: Medicare Other

## 2020-09-20 ENCOUNTER — Other Ambulatory Visit: Payer: Self-pay

## 2020-09-20 ENCOUNTER — Inpatient Hospital Stay (HOSPITAL_COMMUNITY)
Admission: EM | Admit: 2020-09-20 | Discharge: 2020-09-27 | DRG: 175 | Disposition: A | Discharge status: Home Health | Payer: Medicare Other | Attending: Family Medicine | Admitting: Family Medicine

## 2020-09-20 ENCOUNTER — Telehealth: Payer: Self-pay

## 2020-09-20 DIAGNOSIS — R54 Age-related physical debility: Secondary | ICD-10-CM | POA: Diagnosis present

## 2020-09-20 DIAGNOSIS — Z7401 Bed confinement status: Secondary | ICD-10-CM

## 2020-09-20 DIAGNOSIS — G809 Cerebral palsy, unspecified: Secondary | ICD-10-CM | POA: Diagnosis present

## 2020-09-20 DIAGNOSIS — Z8546 Personal history of malignant neoplasm of prostate: Secondary | ICD-10-CM

## 2020-09-20 DIAGNOSIS — L8990 Pressure ulcer of unspecified site, unspecified stage: Secondary | ICD-10-CM | POA: Insufficient documentation

## 2020-09-20 DIAGNOSIS — I2699 Other pulmonary embolism without acute cor pulmonale: Secondary | ICD-10-CM | POA: Diagnosis not present

## 2020-09-20 DIAGNOSIS — J9601 Acute respiratory failure with hypoxia: Secondary | ICD-10-CM | POA: Diagnosis present

## 2020-09-20 DIAGNOSIS — Z681 Body mass index (BMI) 19 or less, adult: Secondary | ICD-10-CM

## 2020-09-20 DIAGNOSIS — J9811 Atelectasis: Secondary | ICD-10-CM | POA: Diagnosis present

## 2020-09-20 DIAGNOSIS — R4189 Other symptoms and signs involving cognitive functions and awareness: Secondary | ICD-10-CM

## 2020-09-20 DIAGNOSIS — E43 Unspecified severe protein-calorie malnutrition: Secondary | ICD-10-CM | POA: Insufficient documentation

## 2020-09-20 DIAGNOSIS — R1310 Dysphagia, unspecified: Secondary | ICD-10-CM | POA: Diagnosis present

## 2020-09-20 DIAGNOSIS — Z86711 Personal history of pulmonary embolism: Secondary | ICD-10-CM | POA: Diagnosis present

## 2020-09-20 DIAGNOSIS — I739 Peripheral vascular disease, unspecified: Secondary | ICD-10-CM | POA: Diagnosis present

## 2020-09-20 DIAGNOSIS — Z7901 Long term (current) use of anticoagulants: Secondary | ICD-10-CM

## 2020-09-20 DIAGNOSIS — L89622 Pressure ulcer of left heel, stage 2: Secondary | ICD-10-CM | POA: Diagnosis present

## 2020-09-20 DIAGNOSIS — Z66 Do not resuscitate: Secondary | ICD-10-CM | POA: Diagnosis present

## 2020-09-20 DIAGNOSIS — Z993 Dependence on wheelchair: Secondary | ICD-10-CM

## 2020-09-20 DIAGNOSIS — E7800 Pure hypercholesterolemia, unspecified: Secondary | ICD-10-CM | POA: Diagnosis present

## 2020-09-20 DIAGNOSIS — I5043 Acute on chronic combined systolic (congestive) and diastolic (congestive) heart failure: Secondary | ICD-10-CM | POA: Diagnosis not present

## 2020-09-20 DIAGNOSIS — Z8349 Family history of other endocrine, nutritional and metabolic diseases: Secondary | ICD-10-CM

## 2020-09-20 DIAGNOSIS — E876 Hypokalemia: Secondary | ICD-10-CM

## 2020-09-20 DIAGNOSIS — I5022 Chronic systolic (congestive) heart failure: Secondary | ICD-10-CM

## 2020-09-20 DIAGNOSIS — F0390 Unspecified dementia without behavioral disturbance: Secondary | ICD-10-CM

## 2020-09-20 DIAGNOSIS — Z86718 Personal history of other venous thrombosis and embolism: Secondary | ICD-10-CM

## 2020-09-20 DIAGNOSIS — Z8249 Family history of ischemic heart disease and other diseases of the circulatory system: Secondary | ICD-10-CM

## 2020-09-20 DIAGNOSIS — R7303 Prediabetes: Secondary | ICD-10-CM | POA: Diagnosis present

## 2020-09-20 DIAGNOSIS — Z89611 Acquired absence of right leg above knee: Secondary | ICD-10-CM

## 2020-09-20 DIAGNOSIS — R0602 Shortness of breath: Secondary | ICD-10-CM | POA: Diagnosis not present

## 2020-09-20 DIAGNOSIS — Z923 Personal history of irradiation: Secondary | ICD-10-CM

## 2020-09-20 DIAGNOSIS — I472 Ventricular tachycardia: Secondary | ICD-10-CM | POA: Diagnosis not present

## 2020-09-20 DIAGNOSIS — Z79899 Other long term (current) drug therapy: Secondary | ICD-10-CM

## 2020-09-20 DIAGNOSIS — Z20822 Contact with and (suspected) exposure to covid-19: Secondary | ICD-10-CM | POA: Diagnosis present

## 2020-09-20 DIAGNOSIS — J90 Pleural effusion, not elsewhere classified: Secondary | ICD-10-CM

## 2020-09-20 DIAGNOSIS — Z833 Family history of diabetes mellitus: Secondary | ICD-10-CM

## 2020-09-20 DIAGNOSIS — E46 Unspecified protein-calorie malnutrition: Secondary | ICD-10-CM | POA: Diagnosis present

## 2020-09-20 DIAGNOSIS — I110 Hypertensive heart disease with heart failure: Secondary | ICD-10-CM | POA: Diagnosis present

## 2020-09-20 DIAGNOSIS — I248 Other forms of acute ischemic heart disease: Secondary | ICD-10-CM | POA: Diagnosis present

## 2020-09-20 DIAGNOSIS — Z87891 Personal history of nicotine dependence: Secondary | ICD-10-CM

## 2020-09-20 DIAGNOSIS — Z9889 Other specified postprocedural states: Secondary | ICD-10-CM

## 2020-09-20 LAB — CBC WITH DIFFERENTIAL/PLATELET
Abs Immature Granulocytes: 0.02 10*3/uL (ref 0.00–0.07)
Basophils Absolute: 0.1 10*3/uL (ref 0.0–0.1)
Basophils Relative: 1 %
Eosinophils Absolute: 0.1 10*3/uL (ref 0.0–0.5)
Eosinophils Relative: 2 %
HCT: 41 % (ref 39.0–52.0)
Hemoglobin: 12.9 g/dL — ABNORMAL LOW (ref 13.0–17.0)
Immature Granulocytes: 0 %
Lymphocytes Relative: 20 %
Lymphs Abs: 1.2 10*3/uL (ref 0.7–4.0)
MCH: 29.2 pg (ref 26.0–34.0)
MCHC: 31.5 g/dL (ref 30.0–36.0)
MCV: 92.8 fL (ref 80.0–100.0)
Monocytes Absolute: 0.5 10*3/uL (ref 0.1–1.0)
Monocytes Relative: 8 %
Neutro Abs: 4.4 10*3/uL (ref 1.7–7.7)
Neutrophils Relative %: 69 %
Platelets: 253 10*3/uL (ref 150–400)
RBC: 4.42 MIL/uL (ref 4.22–5.81)
RDW: 14.6 % (ref 11.5–15.5)
WBC: 6.4 10*3/uL (ref 4.0–10.5)
nRBC: 0 % (ref 0.0–0.2)

## 2020-09-20 LAB — I-STAT VENOUS BLOOD GAS, ED
Acid-Base Excess: 0 mmol/L (ref 0.0–2.0)
Acid-base deficit: 1 mmol/L (ref 0.0–2.0)
Bicarbonate: 22.3 mmol/L (ref 20.0–28.0)
Bicarbonate: 24.1 mmol/L (ref 20.0–28.0)
Calcium, Ion: 1.18 mmol/L (ref 1.15–1.40)
Calcium, Ion: 1.22 mmol/L (ref 1.15–1.40)
HCT: 40 % (ref 39.0–52.0)
HCT: 40 % (ref 39.0–52.0)
Hemoglobin: 13.6 g/dL (ref 13.0–17.0)
Hemoglobin: 13.6 g/dL (ref 13.0–17.0)
O2 Saturation: 96 %
O2 Saturation: 99 %
Potassium: 3.6 mmol/L (ref 3.5–5.1)
Potassium: 3.6 mmol/L (ref 3.5–5.1)
Sodium: 143 mmol/L (ref 135–145)
Sodium: 144 mmol/L (ref 135–145)
TCO2: 23 mmol/L (ref 22–32)
TCO2: 25 mmol/L (ref 22–32)
pCO2, Ven: 31.3 mmHg — ABNORMAL LOW (ref 44.0–60.0)
pCO2, Ven: 38.3 mmHg — ABNORMAL LOW (ref 44.0–60.0)
pH, Ven: 7.407 (ref 7.250–7.430)
pH, Ven: 7.461 — ABNORMAL HIGH (ref 7.250–7.430)
pO2, Ven: 128 mmHg — ABNORMAL HIGH (ref 32.0–45.0)
pO2, Ven: 76 mmHg — ABNORMAL HIGH (ref 32.0–45.0)

## 2020-09-20 LAB — COMPREHENSIVE METABOLIC PANEL
ALT: 13 U/L (ref 0–44)
AST: 15 U/L (ref 15–41)
Albumin: 2.4 g/dL — ABNORMAL LOW (ref 3.5–5.0)
Alkaline Phosphatase: 60 U/L (ref 38–126)
Anion gap: 7 (ref 5–15)
BUN: 9 mg/dL (ref 8–23)
CO2: 23 mmol/L (ref 22–32)
Calcium: 8.6 mg/dL — ABNORMAL LOW (ref 8.9–10.3)
Chloride: 110 mmol/L (ref 98–111)
Creatinine, Ser: 0.51 mg/dL — ABNORMAL LOW (ref 0.61–1.24)
GFR, Estimated: >60 mL/min (ref >60)
Glucose, Bld: 122 mg/dL — ABNORMAL HIGH (ref 70–99)
Potassium: 3.6 mmol/L (ref 3.5–5.1)
Sodium: 140 mmol/L (ref 135–145)
Total Bilirubin: 0.8 mg/dL (ref 0.3–1.2)
Total Protein: 5.6 g/dL — ABNORMAL LOW (ref 6.5–8.1)

## 2020-09-20 LAB — RESP PANEL BY RT-PCR (FLU A&B, COVID) ARPGX2
Influenza A by PCR: NEGATIVE
Influenza B by PCR: NEGATIVE
SARS Coronavirus 2 by RT PCR: NEGATIVE

## 2020-09-20 LAB — PROTIME-INR
INR: 1.2 (ref 0.8–1.2)
Prothrombin Time: 14.9 seconds (ref 11.4–15.2)

## 2020-09-20 LAB — LACTIC ACID, PLASMA
Lactic Acid, Venous: 2.1 mmol/L (ref 0.5–1.9)
Lactic Acid, Venous: 1.8 mmol/L (ref 0.5–1.9)

## 2020-09-20 LAB — D-DIMER, QUANTITATIVE: D-Dimer, Quant: 1.68 ug/mL-FEU — ABNORMAL HIGH (ref 0.00–0.50)

## 2020-09-20 MED ORDER — HEPARIN BOLUS VIA INFUSION
1500.0000 [IU] | Freq: Once | INTRAVENOUS | Status: AC
  Administered 2020-09-21: 1500 [IU] via INTRAVENOUS
  Filled 2020-09-20: qty 1500

## 2020-09-20 MED ORDER — HEPARIN (PORCINE) 25000 UT/250ML-% IV SOLN
1100.0000 [IU]/h | INTRAVENOUS | Status: DC
  Administered 2020-09-21: 800 [IU]/h via INTRAVENOUS
  Administered 2020-09-22: 950 [IU]/h via INTRAVENOUS
  Filled 2020-09-20 (×2): qty 250

## 2020-09-20 MED ORDER — IOHEXOL 350 MG/ML SOLN
75.0000 mL | Freq: Once | INTRAVENOUS | Status: AC | PRN
  Administered 2020-09-20: 75 mL via INTRAVENOUS

## 2020-09-20 NOTE — Progress Notes (Signed)
ANTICOAGULATION CONSULT NOTE - Initial Consult  Pharmacy Consult for heparin Indication: pulmonary embolus  No Known Allergies  Patient Measurements: Height: 5\' 7"  (170.2 cm) Weight: 49.9 kg (110 lb) IBW/kg (Calculated) : 66.1 Heparin Dosing Weight: TBW  Vital Signs: Temp: 97.4 F (36.3 C) (07/05 1808) Temp Source: Oral (07/05 1808) BP: 134/92 (07/05 2245) Pulse Rate: 104 (07/05 2245)  Labs: Recent Labs    09/20/20 1825 09/20/20 1906 09/20/20 1958 09/20/20 2016  HGB 12.9*  --  13.6 13.6  HCT 41.0  --  40.0 40.0  PLT 253  --   --   --   LABPROT  --  14.9  --   --   INR  --  1.2  --   --   CREATININE 0.51*  --   --   --     Estimated Creatinine Clearance: 52.8 mL/min (A) (by C-G formula based on SCr of 0.51 mg/dL (L)).   Medical History: Past Medical History:  Diagnosis Date   Cancer [facility])    Prostate   Cerebral palsy ([place])    Hyperlipidemia    Hypertension    Peripheral arterial disease ([place])    critical limb ischemia    Assessment: 23 YOM presenting with SOB, on Eliquis PTA for hx DVT, last dose taken this AM, now with new acute PE on CTA, given new clot and timing of last dose will give small heparin bolus with gtt  Goal of Therapy:  Heparin level 0.3-0.7 units/ml aPTT 66-102 seconds Monitor platelets by anticoagulation protocol: Yes   Plan:  Heparin 1500 units IV x 1, and gtt at 800 units/hr F/u 8 hour aPTT/HL F/u long term [facility] plan  Bertis Ruddy, PharmD Clinical Pharmacist ED Pharmacist Phone # [phone] 09/20/2020 11:25 PM

## 2020-09-20 NOTE — ED Notes (Signed)
When patient is coached to slow breathing patient is able to control RR.  Almost as though patient is panicking

## 2020-09-20 NOTE — ED Notes (Signed)
Lactic 2.1

## 2020-09-20 NOTE — Telephone Encounter (Signed)
[place] RN calls nurse line to report changes in breathing. RN reports that patient has been having apneic breathing that then transitions to tachpnea. Current respiration rate is 32 and appears to be labored. Reports that during visit oxygen saturation dropped to 88 % on room air with heart rate increasing to 107. BP WNL.   Denies fever and is not experiencing any sick symptoms. Denies chest pain.  Advised ED evaluation. RN requesting that I speak with provider. Spoke with Dr. Vanessa [place], recommended ED evaluation. HH RN will call 911 for patient to be evaluated.   Talbot Grumbling, RN

## 2020-09-20 NOTE — ED Provider Notes (Signed)
[facility] EMERGENCY DEPARTMENT Provider Note   CSN: [id number] Arrival date & time: 09/20/20  1800     History Chief Complaint  Patient presents with   Shortness of Breath    Rick Schwartz is a 80 y.o. male.  HPI     Rick Schwartz is a 80 y.o. male, with a history of cerebral palsy, hyperlipidemia, HTN, presenting to the ED with shortness of breath beginning yesterday.  He denies fever/chills, cough, lower extremity edema, chest pain, abdominal pain, N/V/D, or any other complaints.     Past Medical History:  Diagnosis Date   Cancer [facility])    Prostate   Cerebral palsy ([place])    Hyperlipidemia    Hypertension    Peripheral arterial disease ([place])    critical limb ischemia    Patient Active Problem List   Diagnosis Date Noted   SBO (small bowel obstruction) ([place])    Inanition ([place])    Foot ulcer with necrosis of muscle, right ([place]) 07/18/2020   Severe sepsis ([place]) 07/18/2020   Lactic acidosis 07/18/2020   Hypernatremia 07/18/2020   Hypercalcemia 07/18/2020   Severe protein-calorie malnutrition ([place])    Gangrene of right foot (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   GLUCOSE INTOLERANCE 05/16/2006   HYPERCHOLESTEROLEMIA 05/16/2006   Infantile cerebral palsy ([place]) 05/16/2006   HYPERTENSION, BENIGN SYSTEMIC 05/16/2006   BPH 05/16/2006    Past Surgical History:  Procedure Laterality Date   AMPUTATION Right 07/22/2020   Procedure: RIGHT ABOVE KNEE AMPUTATION;  Surgeon: [name], MD;  Location: [facility];  Service: Orthopedics;  Laterality: Right;   PROSTATE SURGERY         Family History  Problem Relation Age of Onset   Diabetes Mother    Hypertension Mother    Hypertension Father    Diabetes Sister    Hypertension Sister    Diabetes Brother    Hypertension Brother     Diabetes Sister    Thyroid disease Sister    Sudden death Brother     Social History   Tobacco Use   Smoking status: Former    Packs/day: 0.25    Years: 35.00    Pack years: 8.75    Types: Cigarettes    Quit date: 10/12/1994    Years since quitting: 25.9   Smokeless tobacco: Never  Vaping Use   Vaping Use: Never used  Substance Use Topics   Alcohol use: Yes    Comment: Occasional beer and gin   Drug use: No    Home Medications Prior to Admission medications   Medication Sig Start Date End Date Taking? Authorizing Provider  apixaban (ELIQUIS) 2.5 MG TABS tablet Take 1 tablet (2.5 mg total) by mouth 2 (two) times daily. Starting 5/17 08/02/20   Gifford Shave, MD  apixaban (ELIQUIS) 5 MG TABS tablet Take 1 tablet (5 mg total) by mouth 2 (two) times daily. Until 5/16 07/27/20   Gifford Shave, MD  ascorbic acid (VITAMIN C) 1000 MG tablet Take 1 tablet (1,000 mg total) by mouth daily. 07/28/20   Gifford Shave, MD  aspirin EC 81 MG tablet Take 81 mg by mouth at bedtime. Swallow whole.    [provider]  Nutritional Supplements (,FEEDING SUPPLEMENT, PROSOURCE PLUS) liquid Take 30 mLs by mouth 3 (three) times daily between meals. 07/27/20  Gifford Shave, MD  Nystatin (GERHARDT'S BUTT CREAM) CREA Apply 1 application topically 3 (three) times daily. 07/27/20   Gifford Shave, MD  potassium chloride SA (KLOR-CON) 10 MEQ tablet Take 1 tablet (10 mEq total) by mouth daily. 07/27/20   Gifford Shave, MD  rosuvastatin (CRESTOR) 20 MG tablet Take 1 tablet (20 mg total) by mouth daily. 07/27/20   Gifford Shave, MD    Allergies    Patient has no known allergies.  Review of Systems   Review of Systems  Constitutional:  Negative for chills, diaphoresis and fever.  Respiratory:  Positive for shortness of breath. Negative for cough.   Cardiovascular:  Negative for chest pain and leg swelling.  Gastrointestinal:  Negative for abdominal pain, diarrhea, nausea and vomiting.   Genitourinary:  Negative for dysuria.  Neurological:  Negative for dizziness, weakness and numbness.  All other systems reviewed and are negative.  Physical Exam Updated Vital Signs BP 138/86   Pulse (!) 108   Temp (!) 97.4 F (36.3 C) (Oral)   Resp (!) 40   Ht 5\' 7"  (1.702 m)   Wt 49.9 kg   SpO2 98%   BMI 17.23 kg/m   Physical Exam Vitals and nursing note reviewed.  Constitutional:      General: He is not in acute distress.    Appearance: He is well-developed. He is ill-appearing. He is not diaphoretic.  HENT:     Head: Normocephalic and atraumatic.     Mouth/Throat:     Mouth: Mucous membranes are moist.     Pharynx: Oropharynx is clear.  Eyes:     Conjunctiva/sclera: Conjunctivae normal.  Cardiovascular:     Rate and Rhythm: Regular rhythm. Tachycardia present.     Pulses: Normal pulses.          Radial pulses are 2+ on the right side and 2+ on the left side.       Posterior tibial pulses are 2+ on the left side.     Heart sounds: Normal heart sounds.     Comments: Tactile temperature in the extremities appropriate and equal bilaterally. Pulmonary:     Effort: Tachypnea and respiratory distress present.     Comments: Patient with increased work of breathing and tachypnea despite his room air SPO2 at 96%. Patient was unable to sit forward to allow auscultation of lung sounds on his back.  The accessible areas in the patient's chest were auscultated to evaluate his lung sounds.  These areas were rather unremarkable.  However, patient may have diminished lung sounds in the back. Abdominal:     Palpations: Abdomen is soft.     Tenderness: There is no abdominal tenderness. There is no guarding.  Musculoskeletal:     Cervical back: Neck supple.     Right lower leg: No edema (AKA).     Left lower leg: No edema.     Comments: He does have a wound noted to the posterior left heel which he states has been there for "a long time."  It appears as though there is a necrotic  section in the middle of the wound.  Lymphadenopathy:     Cervical: No cervical adenopathy.  Skin:    General: Skin is warm and dry.  Neurological:     Mental Status: He is alert.  Psychiatric:        Mood and Affect: Mood and affect normal.        Speech: Speech normal.        Behavior:  Behavior normal.    ED Results / Procedures / Treatments   Labs (all labs ordered are listed, but only abnormal results are displayed) Labs Reviewed  COMPREHENSIVE METABOLIC PANEL - Abnormal; Notable for the following components:      Result Value   Glucose, Bld 122 (*)    Creatinine, Ser 0.51 (*)    Calcium 8.6 (*)    Total Protein 5.6 (*)    Albumin 2.4 (*)    All other components within normal limits  LACTIC ACID, PLASMA - Abnormal; Notable for the following components:   Lactic Acid, Venous 2.1 (*)    All other components within normal limits  CBC WITH DIFFERENTIAL/PLATELET - Abnormal; Notable for the following components:   Hemoglobin 12.9 (*)    All other components within normal limits  D-DIMER, QUANTITATIVE - Abnormal; Notable for the following components:   D-Dimer, Quant 1.68 (*)    All other components within normal limits  I-STAT VENOUS BLOOD GAS, ED - Abnormal; Notable for the following components:   pCO2, Ven 38.3 (*)    pO2, Ven 128.0 (*)    All other components within normal limits  I-STAT VENOUS BLOOD GAS, ED - Abnormal; Notable for the following components:   pH, Ven 7.461 (*)    pCO2, Ven 31.3 (*)    pO2, Ven 76.0 (*)    All other components within normal limits  RESP PANEL BY RT-PCR (FLU A&B, COVID) ARPGX2  CULTURE, BLOOD (ROUTINE X 2)  CULTURE, BLOOD (ROUTINE X 2)  LACTIC ACID, PLASMA  PROTIME-INR  URINALYSIS, ROUTINE W REFLEX MICROSCOPIC  HEPARIN LEVEL (UNFRACTIONATED)  APTT  CBC  BRAIN NATRIURETIC PEPTIDE  TROPONIN I (HIGH SENSITIVITY)    EKG EKG Interpretation  Date/Time:  Tuesday September 20 2020 18:08:51 EDT Ventricular Rate:  106 PR  Interval:  178 QRS Duration: 68 QT Interval:  354 QTC Calculation: 471 R Axis:   112 Text Interpretation: Sinus tachycardia Multiple premature complexes, vent & supraven Probable anterolateral infarct, age indeterm Sinus tachycardia Confirmed by Lavenia Atlas [phone]) on 09/20/2020 7:21:22 PM  Radiology DG Chest 2 View  Result Date: 09/20/2020 CLINICAL DATA:  Sepsis EXAM: CHEST - 2 VIEW COMPARISON:  None. FINDINGS: Shallow lung inflation. Normal cardiomediastinal contours. Right mid lung opacities, likely atelectasis. Mild bilateral interstitial opacity suggesting pulmonary vascular congestion without overt edema. Small right pleural effusion. IMPRESSION: Shallow lung inflation with small right pleural effusion and right mid lung atelectasis. Electronically Signed   By: Ulyses Jarred M.D.   On: 09/20/2020 19:03   CT Angio Chest PE W and/or Wo Contrast  Result Date: 09/20/2020 CLINICAL DATA:  Elevated D-dimer, history of prostate cancer, evaluate for PE. Cerebral palsy. EXAM: CT ANGIOGRAPHY CHEST WITH CONTRAST TECHNIQUE: Multidetector CT imaging of the chest was performed using the standard protocol during bolus administration of intravenous contrast. Multiplanar CT image reconstructions and MIPs were obtained to evaluate the vascular anatomy. CONTRAST:  39mL OMNIPAQUE IOHEXOL 350 MG/ML SOLN COMPARISON:  Chest radiographs dated 09/20/2020. CT chest dated 10/01/2007. FINDINGS: Cardiovascular: Satisfactory opacification of the bilateral pulmonary arteries to the lobar level. Suspected segmental/subsegmental filling defect branches of the right lower lobe pulmonary artery (series 7/images 78 and 82). Overall clot burden is small. No findings of right heart strain. Study is not tailored for evaluation of the thoracic aorta. No evidence of thoracic aortic aneurysm. Atherosclerotic calcifications of the aortic arch. The heart is normal in size.  No pericardial effusion. Three vessel coronary atherosclerosis.  Mediastinum/Nodes: No suspicious mediastinal lymphadenopathy.  Visualized thyroid is unremarkable. Lungs/Pleura: Moderate to large right pleural effusion. Small to moderate left pleural effusion. Associated compressive atelectasis in the right upper, middle, and right lower lobe. Compressive atelectasis in the left lower lobe. No suspicious pulmonary nodules, noting motion degradation. No pneumothorax. Upper Abdomen: Visualized upper abdomen is notable for low-density thickening of the left adrenal gland (series 7/image 142), suggesting a 2.0 cm left adrenal adenoma. Vascular calcifications. Musculoskeletal: Mild degenerative changes of the visualized thoracolumbar spine. No focal osseous lesions. Review of the MIP images confirms the above findings. IMPRESSION: Suspected segmental/subsegmental pulmonary embolus within branches of the right lower lobe pulmonary artery, equivocal/indeterminate (given the limitations of the current study) but favored to reflect a true finding. Overall clot burden is small. No evidence of right heart strain. Critical Value/emergent results were called by telephone at the time of interpretation on 09/20/2020 at 11:20 pm to provider Dr. Dina Rich, who verbally acknowledged these results. Moderate to large bilateral pleural effusions, right greater than left. Associated compressive atelectasis in the lungs bilaterally. Aortic Atherosclerosis (ICD10-I70.0). Electronically Signed   By: Julian Hy M.D.   On: 09/20/2020 23:22   DG Foot Complete Left  Result Date: 09/20/2020 CLINICAL DATA:  Left heel wound for an unknown period of time. EXAM: LEFT FOOT - COMPLETE 3+ VIEW COMPARISON:  None. FINDINGS: Degenerative changes in the interphalangeal joints and first tarsometatarsal joint with evidence of articular bone erosions at the first metatarsal phalangeal and interphalangeal joints. This could represent erosive arthritis or septic arthritis. No evidence of acute fracture or dislocation.  Calcaneus appears intact. No focal erosions to suggest osteomyelitis. Prominent vascular calcifications. IMPRESSION: 1. Degenerative changes. Erosive versus septic arthritic changes in the first metatarsal phalangeal and interphalangeal joints. 2. No evidence of osteomyelitis in the heel. Electronically Signed   By: Lucienne Capers M.D.   On: 09/20/2020 19:32    Procedures Procedures   Medications Ordered in ED Medications  heparin bolus via infusion 1,500 Units (has no administration in time range)  heparin ADULT infusion 100 units/mL (25000 units/288mL) (has no administration in time range)  iohexol (OMNIPAQUE) 350 MG/ML injection 75 mL (75 mLs Intravenous Contrast Given 09/20/20 2306)    ED Course  I have reviewed the triage vital signs and the nursing notes.  Pertinent labs & imaging results that were available during my care of the patient were reviewed by me and considered in my medical decision making (see chart for details).  Clinical Course as of 09/21/20 0050  Tue Sep 20, 2020  2036 DG Foot Complete Left My suspicion is low for acute abnormality in the first MTP joint, such as septic arthritis.  Patient has no pain anywhere in the distal foot, joints, or toes.  No tenderness, swelling, erythema, increased warmth, pain with range of motion. [SJ]  2250 Resp(!): 47 Patient's respiratory rate was elevated, but not nearly to this level.  While at rest, it is noted to be around 26 breaths/min. [SJ]  2346 Called and updated patient's sister, Katy Apo, at the patient's request.  [SJ]  Wed Sep 21, 2020  0047 Spoke with Dr. Ronnald Ramp, Family Medicine resident. Agrees to come assess the patient for admission. [SJ]    Clinical Course User Index [SJ] Norris Brumbach, Helane Gunther, PA-C   MDM Rules/Calculators/A&P                          Patient presents with shortness of breath beginning yesterday and worsening since then. He is ill-appearing,  tachypneic, tachycardic.  He was not hypoxic on my assessment nor  was he febrile.  I reviewed the patient's chart for more information. I personally reviewed and interpreted his labs and imaging studies. No leukocytosis.  COVID and influenza negative. Elevated D-dimer.  CTA with evidence of PE as well as bilateral pleural effusions.  He sustained PE even while taking Eliquis. Though patient has a listed history of prostate cancer, he states he does not have active cancer as far as he knows and is not receiving chemotherapy or other such treatments.  Due to the patient's presentation with tachypnea and tachycardia, combined with his imaging study findings, patient will need to be admitted.  Findings and plan of care discussed with attending physician, Lavenia Atlas, DO. Dr. Dina Rich personally evaluated and examined this patient.  Final Clinical Impression(s) / ED Diagnoses Final diagnoses:  Shortness of breath  Pleural effusion, bilateral  Right pulmonary embolus [facility])    Rx / DC Orders ED Discharge Orders     None        Lorayne Bender, PA-C 09/21/20 0050    Lorelle Gibbs, DO 09/22/20 0014

## 2020-09-20 NOTE — ED Triage Notes (Signed)
Home health called EMS for decreased O2 sats and tachypnea.   Patient has necrotic foot on left foot.  Not on antibiotics.

## 2020-09-20 NOTE — ED Notes (Signed)
Rick Schwartz, Niece, asked that she be called with any changes in patient status, especially if [name] cannot be reached. [place] phone is [phone].

## 2020-09-21 ENCOUNTER — Observation Stay (HOSPITAL_COMMUNITY): Payer: Medicare Other

## 2020-09-21 ENCOUNTER — Observation Stay (HOSPITAL_BASED_OUTPATIENT_CLINIC_OR_DEPARTMENT_OTHER): Payer: Medicare Other

## 2020-09-21 DIAGNOSIS — R0602 Shortness of breath: Secondary | ICD-10-CM | POA: Diagnosis present

## 2020-09-21 DIAGNOSIS — I739 Peripheral vascular disease, unspecified: Secondary | ICD-10-CM | POA: Diagnosis present

## 2020-09-21 DIAGNOSIS — I248 Other forms of acute ischemic heart disease: Secondary | ICD-10-CM | POA: Diagnosis present

## 2020-09-21 DIAGNOSIS — I5042 Chronic combined systolic (congestive) and diastolic (congestive) heart failure: Secondary | ICD-10-CM

## 2020-09-21 DIAGNOSIS — I5021 Acute systolic (congestive) heart failure: Secondary | ICD-10-CM | POA: Diagnosis not present

## 2020-09-21 DIAGNOSIS — Z515 Encounter for palliative care: Secondary | ICD-10-CM | POA: Diagnosis not present

## 2020-09-21 DIAGNOSIS — Z7401 Bed confinement status: Secondary | ICD-10-CM | POA: Diagnosis not present

## 2020-09-21 DIAGNOSIS — Z923 Personal history of irradiation: Secondary | ICD-10-CM | POA: Diagnosis not present

## 2020-09-21 DIAGNOSIS — F0390 Unspecified dementia without behavioral disturbance: Secondary | ICD-10-CM | POA: Diagnosis not present

## 2020-09-21 DIAGNOSIS — Z86711 Personal history of pulmonary embolism: Secondary | ICD-10-CM

## 2020-09-21 DIAGNOSIS — J90 Pleural effusion, not elsewhere classified: Secondary | ICD-10-CM | POA: Diagnosis not present

## 2020-09-21 DIAGNOSIS — I2699 Other pulmonary embolism without acute cor pulmonale: Secondary | ICD-10-CM | POA: Diagnosis present

## 2020-09-21 DIAGNOSIS — I2609 Other pulmonary embolism with acute cor pulmonale: Secondary | ICD-10-CM

## 2020-09-21 DIAGNOSIS — F0150 Vascular dementia without behavioral disturbance: Secondary | ICD-10-CM | POA: Diagnosis not present

## 2020-09-21 DIAGNOSIS — L89622 Pressure ulcer of left heel, stage 2: Secondary | ICD-10-CM | POA: Diagnosis present

## 2020-09-21 DIAGNOSIS — Z86718 Personal history of other venous thrombosis and embolism: Secondary | ICD-10-CM | POA: Diagnosis not present

## 2020-09-21 DIAGNOSIS — R54 Age-related physical debility: Secondary | ICD-10-CM | POA: Diagnosis present

## 2020-09-21 DIAGNOSIS — J9601 Acute respiratory failure with hypoxia: Secondary | ICD-10-CM | POA: Diagnosis present

## 2020-09-21 DIAGNOSIS — G809 Cerebral palsy, unspecified: Secondary | ICD-10-CM | POA: Diagnosis present

## 2020-09-21 DIAGNOSIS — L0390 Cellulitis, unspecified: Secondary | ICD-10-CM | POA: Diagnosis not present

## 2020-09-21 DIAGNOSIS — J9811 Atelectasis: Secondary | ICD-10-CM | POA: Diagnosis present

## 2020-09-21 DIAGNOSIS — I5043 Acute on chronic combined systolic (congestive) and diastolic (congestive) heart failure: Secondary | ICD-10-CM | POA: Diagnosis not present

## 2020-09-21 DIAGNOSIS — Z66 Do not resuscitate: Secondary | ICD-10-CM | POA: Diagnosis present

## 2020-09-21 DIAGNOSIS — I5022 Chronic systolic (congestive) heart failure: Secondary | ICD-10-CM | POA: Diagnosis not present

## 2020-09-21 DIAGNOSIS — Z8546 Personal history of malignant neoplasm of prostate: Secondary | ICD-10-CM | POA: Diagnosis not present

## 2020-09-21 DIAGNOSIS — Z993 Dependence on wheelchair: Secondary | ICD-10-CM | POA: Diagnosis not present

## 2020-09-21 DIAGNOSIS — R1310 Dysphagia, unspecified: Secondary | ICD-10-CM | POA: Diagnosis present

## 2020-09-21 DIAGNOSIS — E440 Moderate protein-calorie malnutrition: Secondary | ICD-10-CM | POA: Diagnosis not present

## 2020-09-21 DIAGNOSIS — Z20822 Contact with and (suspected) exposure to covid-19: Secondary | ICD-10-CM | POA: Diagnosis present

## 2020-09-21 DIAGNOSIS — I110 Hypertensive heart disease with heart failure: Secondary | ICD-10-CM | POA: Diagnosis present

## 2020-09-21 DIAGNOSIS — E43 Unspecified severe protein-calorie malnutrition: Secondary | ICD-10-CM | POA: Diagnosis present

## 2020-09-21 DIAGNOSIS — Z681 Body mass index (BMI) 19 or less, adult: Secondary | ICD-10-CM | POA: Diagnosis not present

## 2020-09-21 DIAGNOSIS — E7800 Pure hypercholesterolemia, unspecified: Secondary | ICD-10-CM | POA: Diagnosis present

## 2020-09-21 DIAGNOSIS — Z89611 Acquired absence of right leg above knee: Secondary | ICD-10-CM | POA: Diagnosis not present

## 2020-09-21 DIAGNOSIS — Z7189 Other specified counseling: Secondary | ICD-10-CM | POA: Diagnosis not present

## 2020-09-21 DIAGNOSIS — R7303 Prediabetes: Secondary | ICD-10-CM | POA: Diagnosis present

## 2020-09-21 DIAGNOSIS — I472 Ventricular tachycardia: Secondary | ICD-10-CM | POA: Diagnosis not present

## 2020-09-21 HISTORY — DX: Personal history of pulmonary embolism: Z86.711

## 2020-09-21 LAB — APTT
aPTT: 33 seconds (ref 24–36)
aPTT: 36 seconds (ref 24–36)

## 2020-09-21 LAB — URINALYSIS, ROUTINE W REFLEX MICROSCOPIC
Bacteria, UA: NONE SEEN
Bilirubin Urine: NEGATIVE
Glucose, UA: NEGATIVE mg/dL
Hgb urine dipstick: NEGATIVE
Ketones, ur: NEGATIVE mg/dL
Nitrite: NEGATIVE
Protein, ur: NEGATIVE mg/dL
Specific Gravity, Urine: 1.044 — ABNORMAL HIGH (ref 1.005–1.030)
pH: 5 (ref 5.0–8.0)

## 2020-09-21 LAB — CBC
HCT: 42.4 % (ref 39.0–52.0)
Hemoglobin: 12.9 g/dL — ABNORMAL LOW (ref 13.0–17.0)
MCH: 28.8 pg (ref 26.0–34.0)
MCHC: 30.4 g/dL (ref 30.0–36.0)
MCV: 94.6 fL (ref 80.0–100.0)
Platelets: 216 10*3/uL (ref 150–400)
RBC: 4.48 MIL/uL (ref 4.22–5.81)
RDW: 14.7 % (ref 11.5–15.5)
WBC: 5.9 10*3/uL (ref 4.0–10.5)
nRBC: 0 % (ref 0.0–0.2)

## 2020-09-21 LAB — COMPREHENSIVE METABOLIC PANEL
ALT: 11 U/L (ref 0–44)
AST: 16 U/L (ref 15–41)
Albumin: 2.3 g/dL — ABNORMAL LOW (ref 3.5–5.0)
Alkaline Phosphatase: 52 U/L (ref 38–126)
Anion gap: 7 (ref 5–15)
BUN: 12 mg/dL (ref 8–23)
CO2: 21 mmol/L — ABNORMAL LOW (ref 22–32)
Calcium: 8.8 mg/dL — ABNORMAL LOW (ref 8.9–10.3)
Chloride: 112 mmol/L — ABNORMAL HIGH (ref 98–111)
Creatinine, Ser: 0.52 mg/dL — ABNORMAL LOW (ref 0.61–1.24)
GFR, Estimated: >60 mL/min (ref >60)
Glucose, Bld: 124 mg/dL — ABNORMAL HIGH (ref 70–99)
Potassium: 3.7 mmol/L (ref 3.5–5.1)
Sodium: 140 mmol/L (ref 135–145)
Total Bilirubin: 0.8 mg/dL (ref 0.3–1.2)
Total Protein: 5.5 g/dL — ABNORMAL LOW (ref 6.5–8.1)

## 2020-09-21 LAB — ECHOCARDIOGRAM COMPLETE
Area-P 1/2: 2.91 cm2
Calc EF: 34.7 %
Height: 67 in
S' Lateral: 4.3 cm
Single Plane A2C EF: 28.3 %
Single Plane A4C EF: 35.2 %
Weight: 1760 oz

## 2020-09-21 LAB — BODY FLUID CELL COUNT WITH DIFFERENTIAL
Lymphs, Fluid: 52 %
Monocyte-Macrophage-Serous Fluid: 14 % — ABNORMAL LOW (ref 50–90)
Neutrophil Count, Fluid: 34 % — ABNORMAL HIGH (ref 0–25)
Total Nucleated Cell Count, Fluid: 158 cu mm (ref 0–1000)

## 2020-09-21 LAB — HEPARIN LEVEL (UNFRACTIONATED)
Heparin Unfractionated: 0.32 IU/mL (ref 0.30–0.70)
Heparin Unfractionated: 0.28 IU/mL — ABNORMAL LOW (ref 0.30–0.70)

## 2020-09-21 LAB — C-REACTIVE PROTEIN: CRP: 1.4 mg/dL — ABNORMAL HIGH (ref <1.0)

## 2020-09-21 LAB — PROTIME-INR
INR: 1.2 (ref 0.8–1.2)
Prothrombin Time: 15.1 seconds (ref 11.4–15.2)

## 2020-09-21 LAB — SEDIMENTATION RATE: Sed Rate: 18 mm/hr — ABNORMAL HIGH (ref 0–16)

## 2020-09-21 LAB — CYTOLOGY - NON PAP

## 2020-09-21 LAB — PROTEIN, PLEURAL OR PERITONEAL FLUID: Total protein, fluid: <3 g/dL

## 2020-09-21 LAB — MAGNESIUM: Magnesium: 1.9 mg/dL (ref 1.7–2.4)

## 2020-09-21 LAB — BRAIN NATRIURETIC PEPTIDE: B Natriuretic Peptide: 1865.8 pg/mL — ABNORMAL HIGH (ref 0.0–100.0)

## 2020-09-21 LAB — TROPONIN I (HIGH SENSITIVITY)
Troponin I (High Sensitivity): 22 ng/L — ABNORMAL HIGH (ref <18)
Troponin I (High Sensitivity): 24 ng/L — ABNORMAL HIGH (ref <18)

## 2020-09-21 LAB — LACTATE DEHYDROGENASE, PLEURAL OR PERITONEAL FLUID: LD, Fluid: 69 U/L — ABNORMAL HIGH (ref 3–23)

## 2020-09-21 MED ORDER — GERHARDT'S BUTT CREAM
1.0000 "application " | TOPICAL_CREAM | Freq: Three times a day (TID) | CUTANEOUS | Status: DC
  Administered 2020-09-21 – 2020-09-27 (×19): 1 via TOPICAL
  Filled 2020-09-21 (×3): qty 1

## 2020-09-21 MED ORDER — LOSARTAN POTASSIUM 25 MG PO TABS
25.0000 mg | ORAL_TABLET | Freq: Every day | ORAL | Status: DC
  Administered 2020-09-21 – 2020-09-27 (×7): 25 mg via ORAL
  Filled 2020-09-21 (×7): qty 1

## 2020-09-21 MED ORDER — ACETAMINOPHEN 650 MG RE SUPP: 650.0000 mg | Freq: Four times a day (QID) | RECTAL | Status: DC | PRN

## 2020-09-21 MED ORDER — FUROSEMIDE 10 MG/ML IJ SOLN
40.0000 mg | Freq: Once | INTRAMUSCULAR | Status: AC
  Administered 2020-09-21: 40 mg via INTRAVENOUS
  Filled 2020-09-21: qty 4

## 2020-09-21 MED ORDER — ACETAMINOPHEN 325 MG PO TABS: 650.0000 mg | ORAL_TABLET | Freq: Four times a day (QID) | ORAL | Status: DC | PRN

## 2020-09-21 MED ORDER — POTASSIUM CHLORIDE CRYS ER 20 MEQ PO TBCR
40.0000 meq | EXTENDED_RELEASE_TABLET | Freq: Once | ORAL | Status: AC
  Administered 2020-09-21: 40 meq via ORAL
  Filled 2020-09-21: qty 2

## 2020-09-21 MED ORDER — FUROSEMIDE 20 MG PO TABS
20.0000 mg | ORAL_TABLET | Freq: Every day | ORAL | Status: DC
  Administered 2020-09-21 – 2020-09-27 (×6): 20 mg via ORAL
  Filled 2020-09-21 (×7): qty 1

## 2020-09-21 MED ORDER — ROSUVASTATIN CALCIUM 20 MG PO TABS
20.0000 mg | ORAL_TABLET | Freq: Every day | ORAL | Status: DC
  Administered 2020-09-21 – 2020-09-27 (×7): 20 mg via ORAL
  Filled 2020-09-21 (×7): qty 1

## 2020-09-21 MED ORDER — HEPARIN BOLUS VIA INFUSION
2000.0000 [IU] | Freq: Once | INTRAVENOUS | Status: AC
  Administered 2020-09-21: 2000 [IU] via INTRAVENOUS
  Filled 2020-09-21: qty 2000

## 2020-09-21 NOTE — Hospital Course (Addendum)
Rick Schwartz is a 80 y.o. male presenting with shortness of breath. Tachycardia, and tachypnea. Found to have PE and bilateral pleural effusions. PMHx is significant for cerebral palsy, HTN, foot ulcer, severe protein calorie malnutrition, DVT, prostate cancer, PAD, and HLD.  Acute Hypoxia  Bilateral pleural effusions, PE Presented with shortness of breath and hypoxia requiring supplemental O2 via Nasal Canula.  Found to have Pulmonary embolism and Chest CTA. Also found to have Bilateral Pleural Effusion on Chest X-Ray.  Thoracentesis performed with 900 mL of Transudative fluid.  Breathing improved following this and patient easily weaned down to room air.  Started on heprin drip, before being transitioned to Eliquis with 10 mg BID for 7 days followed by 5 mg BID to continue after.  Able to be discharged home with recs for 24 hour supervision by family and [place] PT/OT/Nursing.  Severe HFrEF ECHO performed and found severe systolic function.  Cardiology consulted and patient determined not to be surgical candidate given multiple comorbidities.  Started on Lasix 20 mg as well as Losartan and Metoprolol.    Left foot wound  Concern for osteomyelitis Open ulcerated wound on paX-Ray performed and showed minimal concern for osteomyelitis.  Wound care consulted.  Pain in heel improved by time of dischargge with supportive measures.  Severe protein calorie malnutrition Nutrition consult placed.  Diet optimized.  Patient had good oral intake throughout hospitalization.  Palliative care consulted Palliative Consulted given patient's multiple issues and newly diagnosed Heart Failure.  Patient and family elected to remain DNR but otherwise with full treatment for time being.  Plan for further [facility] discussion with Palliative outpatient.  Discharge recommendations Palliative care consulted udrig admission. Follow up with patient's goals, any advanced directives, etc. PT/OT - recommend outpatient  referrals to PT and OT by PCP at follow up.  started on low dose losartan 25mg  daily. SBP runs fairly low, but consistently >100, so will try low dose metoprolol succinate. Recommend BMP at follow up  Eliquis increased to 10mg  BID for 7 days followed by 5mg  BID ongoing starting on 7/14. 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 Patient came in on 2.5mg  eliquis so this was not determined to be a failure.  Home potassium was continued on discharged. Recommend check BMP at follow up.  Can consider increasing Potassium supplementation.

## 2020-09-21 NOTE — Consult Note (Addendum)
Cardiology Consultation:   Patient ID: Rick Schwartz MRN: [id number]; DOB: 1940/10/03  Admit date: 09/20/2020 Date of Consult: 09/21/2020  PCP:  Lurline Del, [place] Providers Cardiologist:  None   {  Patient Profile:   Rick Schwartz is a 80 y.o. male with a history of PAD s/p right above knee amputation in 07/2020, right subclavian DVT diagnosed in 07/2020, hypertension, hyperlipidemia, cerebral palsy, and prostate cancer who presented to the ED on 09/20/2020 for further evaluation of shortness of breath and was found to have a PE and newly reduced EF of <20%. Cardiology consulted for further evaluation/management of new cardiomyopathy at the request of Dr. Owens Shark.  History of Present Illness:   Rick Schwartz is a 80 year old male with the above history. No known cardiac history. Previously followed by Dr. Gwenlyn Found for PAD. Lower extremity arterial dopplers in 2017 showed >50% right common and left external iliac artery stenosis, long segment occlusions of the bilateral proximal to distal SFAs with reconstitution via collateral flow in the popliteal arteries, and occluded bilateral peroneal and right anterior tibial artery, with one vessel run-off on the right and two vessel run-off on the left. Patient was last seen by Dr. Gwenlyn Found in 07/2015 at which time he was doing well with no evidence of critical limb ischemia. Therefore, not felt to be an intervention candidate. He was advised to follow-up as needed. He has not been seen since that time. Patient was recently admitted in 07/2020 with sepsis secondary to gangrene of right lower extremity. Patient ultimately underwent right above knee amputation on 07/22/2020. Hospitalization complicated by hypotension requiring multiple fluid boluses, acute hypoxic respiratory failure felt ot be due to possible aspiration pneumonia, right subclavian DVT (started on Eliquis), hypernatremia, and severe protein calorie malnutrition. Palliative care was consulted  and patient felt to have a poor prognosis given poor oral intake and risk of aspiration. Patient was discharged to SNF with plans to possibly transition to hospice if family amenable.  Patient presented to the ED on 09/20/2020 via EMS for further evaluation of shortness of breath, decreased O2 sats, and tachypnea. Patient reported his breathing was hard. Difficult to get details of symptoms given garbled speech. However, per chart review, onset was sudden (within 1-2 days of presentation). He is very sedentary and is wheelchair bound. He denies any orthopnea and again states his breathing was just hard. No lower extremity edema. No chest pain. No lightheadedness, dizziness, syncope. No recent fevers or illnesses. No GI symptoms. No abnormal bleeding.   Upon arrival in the ED, patient tachypneic and tachycardic. EKG showed sinus tachycardia, rate 106 bpm, with PVC, Q waves in anterolateral leads, and non-specific ST/T changes. High sensitivity troponin minimally elevated and flat at 22 >> 24 consistent with demand ischemia. BNP 1,865. Chest x-ray showed shallow lung inflation with small right pleural effusion and right mid lung atelectasis. D-dimer elevated at 1.68. Chest CTA showed suspected segmental/subsegmental PE within the branches of the right lower lobe pulmonary artery (equivocal/indeterminate given quality of study but favored to reflect a true finding). Overall clot burden small with no evidence of right heart strain. Also showed moderate to large bilateral pleural effusion (right > left). WBC 6.4, Hgb 12.9, Plts 253. Na 140, K 3.6, Glucose 122, BUN 9, Cr 0.51. Albumin 2.4, AST 15, ALT 13, Alk Phos 60, Total Bili 0.8. Lactic acid 2.1. Blood cultures pending. Patient was admitted for further management of PE and bilateral pleural effusion. PCCM was consulted and  patient underwent right sided thoracentesis with removal of 900cc of fluid earlier today. Patient also noted to have left heal wound on  admission. Foot x-ray showed erosive vs septic arthritic changes in the first metatarsal phalangeal and interphalangeal joints but no evidence of osteomyelitis. Wound care consulted. Echo was ordered and showed LVEF of <20% with global hypokinesis. RV normal in size with moderately reduced systolic function. Therefore, Cardiology consulted for further evaluation.  Past Medical History:  Diagnosis Date   Cancer [facility])    Prostate   Cerebral palsy ([place])    Hyperlipidemia    Hypertension    Peripheral arterial disease ([place])    critical limb ischemia    Past Surgical History:  Procedure Laterality Date   AMPUTATION Right 07/22/2020   Procedure: RIGHT ABOVE KNEE AMPUTATION;  Surgeon: [name], MD;  Location: [place];  Service: Orthopedics;  Laterality: Right;   PROSTATE SURGERY       Home Medications:  Prior to Admission medications   Medication Sig Start Date End Date Taking? Authorizing Provider  apixaban (ELIQUIS) 2.5 MG TABS tablet Take 1 tablet (2.5 mg total) by mouth 2 (two) times daily. Starting 5/17 08/02/20  Yes Gifford Shave, MD  ascorbic acid (VITAMIN C) 1000 MG tablet Take 1 tablet (1,000 mg total) by mouth daily. 07/28/20  Yes Gifford Shave, MD  aspirin EC 81 MG tablet Take 81 mg by mouth at bedtime. Swallow whole.   Yes [provider]  Nutritional Supplements (,FEEDING SUPPLEMENT, PROSOURCE PLUS) liquid Take 30 mLs by mouth 3 (three) times daily between meals. 07/27/20  Yes Gifford Shave, MD  Nystatin (GERHARDT'S BUTT CREAM) CREA Apply 1 application topically 3 (three) times daily. 07/27/20  Yes Gifford Shave, MD  potassium chloride SA (KLOR-CON) 10 MEQ tablet Take 1 tablet (10 mEq total) by mouth daily. 07/27/20  Yes Gifford Shave, MD  rosuvastatin (CRESTOR) 20 MG tablet Take 1 tablet (20 mg total) by mouth daily. 07/27/20  Yes Gifford Shave, MD  apixaban (ELIQUIS) 5 MG TABS tablet Take 1 tablet (5 mg total) by mouth 2 (two) times daily. Until  5/16 Patient not taking: No sig reported 07/27/20   Gifford Shave, MD    Inpatient Medications: Scheduled Meds:  Gerhardt's butt cream  1 application Topical TID   rosuvastatin  20 mg Oral Daily   Continuous Infusions:  heparin 800 Units/hr (09/21/20 0029)   PRN Meds: acetaminophen **OR** acetaminophen  Allergies:   No Known Allergies  Social History:   Social History   Socioeconomic History   Marital status: Single    Spouse name: Not on file   Number of children: Not on file   Years of education: Not on file   Highest education level: Not on file  Occupational History   Not on file  Tobacco Use   Smoking status: Former    Packs/day: 0.25    Years: 35.00    Pack years: 8.75    Types: Cigarettes    Quit date: 10/12/1994    Years since quitting: 25.9   Smokeless tobacco: Never  Vaping Use   Vaping Use: Never used  Substance and Sexual Activity   Alcohol use: Yes    Comment: Occasional beer and gin   Drug use: No   Sexual activity: Never  Other Topics Concern   Not on file  Social History Narrative   Not on file   Social Determinants of Health   Financial Resource Strain: Not on file  Food Insecurity: Not on  file  Transportation Needs: Not on file  Physical Activity: Not on file  Stress: Not on file  Social Connections: Not on file  Intimate Partner Violence: Not on file    Family History:   Family History  Problem Relation Age of Onset   Diabetes Mother    Hypertension Mother    Hypertension Father    Diabetes Sister    Hypertension Sister    Diabetes Brother    Hypertension Brother    Diabetes Sister    Thyroid disease Sister    Sudden death Brother      ROS:  Please see the history of present illness.  All other ROS reviewed and negative.     Physical Exam/Data:   Vitals:   09/21/20 0600 09/21/20 0705 09/21/20 0815 09/21/20 0945  BP: 108/82  122/84 126/75  Pulse: 95  93 92  Resp: (!) 38  16 16  Temp:  97.6 F (36.4 C)     TempSrc:  Oral    SpO2: 96%  100% (!) 75%  Weight:      Height:       No intake or output data in the 24 hours ending 09/21/20 1409 Last 3 Weights 09/20/2020 08/01/2020 07/27/2020  Weight (lbs) 110 lb 110 lb 110 lb 3.7 oz  Weight (kg) 49.896 kg 49.896 kg 50 kg     Body mass index is 17.23 kg/m.  General: 80 y.o. thin frail African-American male. No acute distressed. HEENT: Normocephalic and atraumatic. Sclera clear. EOMs intact. Neck: Supple.  Heart: RRR. No murmurs, gallops, or rubs.  Lungs: No increased work of breathing. Decreased breath sounds on left with mild crackles.  Abdomen: Soft, non-distended, and non-tender to palpation. Bowel sounds present. MSK: Normal strength and tone for age. Extremities: No lower extremity edema. S/p right above knee amputation. Right hand contracture. Skin: Warm and dry. Neuro: Alert and oriented x3. No focal deficits. Psych: Normal affect. Responds appropriately.    EKG:  The EKG was personally reviewed and demonstrates: Sinus tachycardia, rate 106 bpm, with PVC, Q waves in anterolateral leads, and non-specific ST/T changes Telemetry:  Telemetry was personally reviewed and demonstrates:  Sinus rhythm with frequent PVC, ventricular couplets, and short runs of NSVT (longest run 3 beats). Rates in the 90s to low 100s.  Relevant CV Studies:  Echocardiogram 09/21/2020: Impressions:  1. Left ventricular ejection fraction, by estimation, is <20%. The left  ventricle has severely decreased function. The left ventricle demonstrates  global hypokinesis. Left ventricular diastolic parameters are  indeterminate.   2. Right ventricular systolic function is moderately reduced. The right  ventricular size is normal.   3. Left atrial size was mildly dilated.   4. Right atrial size was mildly dilated.   5. Moderate pleural effusion in both left and right lateral regions.   6. The mitral valve is normal in structure. Mild mitral valve  regurgitation. No  evidence of mitral stenosis.   7. The aortic valve is tricuspid. There is moderate calcification of the  aortic valve. Aortic valve regurgitation is not visualized. Mild to  moderate aortic valve sclerosis/calcification is present, without any  evidence of aortic stenosis.   8. The inferior vena cava is normal in size with <50% respiratory  variability, suggesting right atrial pressure of 8 mmHg.    Laboratory Data:  High Sensitivity Troponin:   Recent Labs  Lab 09/20/20 2303 09/21/20 0351  TROPONINIHS 22* 24*     Chemistry Recent Labs  Lab 09/20/20 1825 09/20/20 1958  09/20/20 2016 09/21/20 0750  NA 140 144 143 140  K 3.6 3.6 3.6 3.7  CL 110  --   --  112*  CO2 23  --   --  21*  GLUCOSE 122*  --   --  124*  BUN 9  --   --  12  CREATININE 0.51*  --   --  0.52*  CALCIUM 8.6*  --   --  8.8*  GFRNONAA >60  --   --  >60  ANIONGAP 7  --   --  7    Recent Labs  Lab 09/20/20 1825 09/21/20 0750  PROT 5.6* 5.5*  ALBUMIN 2.4* 2.3*  AST 15 16  ALT 13 11  ALKPHOS 60 52  BILITOT 0.8 0.8   Hematology Recent Labs  Lab 09/20/20 1825 09/20/20 1958 09/20/20 2016 09/21/20 0750  WBC 6.4  --   --  5.9  RBC 4.42  --   --  4.48  HGB 12.9* 13.6 13.6 12.9*  HCT 41.0 40.0 40.0 42.4  MCV 92.8  --   --  94.6  MCH 29.2  --   --  28.8  MCHC 31.5  --   --  30.4  RDW 14.6  --   --  14.7  PLT 253  --   --  216   BNP Recent Labs  Lab 09/20/20 2347  BNP 1,865.8*    DDimer  Recent Labs  Lab 09/20/20 1906  DDIMER 1.68*     Radiology/Studies:  DG Chest 1 View  Result Date: 09/21/2020 CLINICAL DATA:  80 year old male status post right thoracentesis. EXAM: CHEST  1 VIEW COMPARISON:  Chest x-ray 09/20/2020. FINDINGS: Right-sided pleural effusion has decreased substantially in is now small. Persistent moderate left pleural effusion. No definite pneumothorax. Bibasilar opacities favored to reflect areas of passive subsegmental atelectasis. No evidence of pulmonary edema. Heart  size is normal. Mediastinal contours are distorted by patient positioning. Atherosclerotic calcifications in the thoracic aorta. IMPRESSION: 1. Decreased small right-sided pleural effusion following thoracentesis. No postprocedural pneumothorax. 2. Persistent moderate left pleural effusion. 3. Bibasilar areas of subsegmental atelectasis. 4. Aortic atherosclerosis. Electronically Signed   By: Vinnie Langton M.D.   On: 09/21/2020 08:02   DG Chest 2 View  Result Date: 09/20/2020 CLINICAL DATA:  Sepsis EXAM: CHEST - 2 VIEW COMPARISON:  None. FINDINGS: Shallow lung inflation. Normal cardiomediastinal contours. Right mid lung opacities, likely atelectasis. Mild bilateral interstitial opacity suggesting pulmonary vascular congestion without overt edema. Small right pleural effusion. IMPRESSION: Shallow lung inflation with small right pleural effusion and right mid lung atelectasis. Electronically Signed   By: Ulyses Jarred M.D.   On: 09/20/2020 19:03   CT Angio Chest PE W and/or Wo Contrast  Result Date: 09/20/2020 CLINICAL DATA:  Elevated D-dimer, history of prostate cancer, evaluate for PE. Cerebral palsy. EXAM: CT ANGIOGRAPHY CHEST WITH CONTRAST TECHNIQUE: Multidetector CT imaging of the chest was performed using the standard protocol during bolus administration of intravenous contrast. Multiplanar CT image reconstructions and MIPs were obtained to evaluate the vascular anatomy. CONTRAST:  20mL OMNIPAQUE IOHEXOL 350 MG/ML SOLN COMPARISON:  Chest radiographs dated 09/20/2020. CT chest dated 10/01/2007. FINDINGS: Cardiovascular: Satisfactory opacification of the bilateral pulmonary arteries to the lobar level. Suspected segmental/subsegmental filling defect branches of the right lower lobe pulmonary artery (series 7/images 78 and 82). Overall clot burden is small. No findings of right heart strain. Study is not tailored for evaluation of the thoracic aorta. No evidence of thoracic aortic aneurysm. Atherosclerotic  calcifications of the aortic arch. The heart is normal in size.  No pericardial effusion. Three vessel coronary atherosclerosis. Mediastinum/Nodes: No suspicious mediastinal lymphadenopathy. Visualized thyroid is unremarkable. Lungs/Pleura: Moderate to large right pleural effusion. Small to moderate left pleural effusion. Associated compressive atelectasis in the right upper, middle, and right lower lobe. Compressive atelectasis in the left lower lobe. No suspicious pulmonary nodules, noting motion degradation. No pneumothorax. Upper Abdomen: Visualized upper abdomen is notable for low-density thickening of the left adrenal gland (series 7/image 142), suggesting a 2.0 cm left adrenal adenoma. Vascular calcifications. Musculoskeletal: Mild degenerative changes of the visualized thoracolumbar spine. No focal osseous lesions. Review of the MIP images confirms the above findings. IMPRESSION: Suspected segmental/subsegmental pulmonary embolus within branches of the right lower lobe pulmonary artery, equivocal/indeterminate (given the limitations of the current study) but favored to reflect a true finding. Overall clot burden is small. No evidence of right heart strain. Critical Value/emergent results were called by telephone at the time of interpretation on 09/20/2020 at 11:20 pm to provider Dr. Dina Rich, who verbally acknowledged these results. Moderate to large bilateral pleural effusions, right greater than left. Associated compressive atelectasis in the lungs bilaterally. Aortic Atherosclerosis (ICD10-I70.0). Electronically Signed   By: Julian Hy M.D.   On: 09/20/2020 23:22   DG Foot Complete Left  Result Date: 09/20/2020 CLINICAL DATA:  Left heel wound for an unknown period of time. EXAM: LEFT FOOT - COMPLETE 3+ VIEW COMPARISON:  None. FINDINGS: Degenerative changes in the interphalangeal joints and first tarsometatarsal joint with evidence of articular bone erosions at the first metatarsal phalangeal and  interphalangeal joints. This could represent erosive arthritis or septic arthritis. No evidence of acute fracture or dislocation. Calcaneus appears intact. No focal erosions to suggest osteomyelitis. Prominent vascular calcifications. IMPRESSION: 1. Degenerative changes. Erosive versus septic arthritic changes in the first metatarsal phalangeal and interphalangeal joints. 2. No evidence of osteomyelitis in the heel. Electronically Signed   By: Lucienne Capers M.D.   On: 09/20/2020 19:32   ECHOCARDIOGRAM COMPLETE  Result Date: 09/21/2020    ECHOCARDIOGRAM REPORT   Patient Name:   Rick Schwartz Date of Exam: 09/21/2020 Medical Rec #:  [id number]      Height:       67.0 in Accession #:    [id number]     Weight:       110.0 lb Date of Birth:  [birth year]      BSA:          1.569 m Patient Age:    84 years       BP:           126/110 mmHg Patient Gender: M              HR:           77 bpm. Exam Location:  Inpatient Procedure: 2D Echo, Color Doppler and Cardiac Doppler                         REPORT CONTAINS CRITICAL RESULT  Severely reduced LVEF, no known prior history. Findings communicated with family                          medicine service at 11:12 AM. Indications:    Pulmonary Embolus I26.09  History:        Patient has no prior history of Echocardiogram examinations.  Risk Factors:Hypertension and Dyslipidemia.  Sonographer:    Bernadene Person RDCS Referring Phys: [id number] [place]  1. Left ventricular ejection fraction, by estimation, is <20%. The left ventricle has severely decreased function. The left ventricle demonstrates global hypokinesis. Left ventricular diastolic parameters are indeterminate.  2. Right ventricular systolic function is moderately reduced. The right ventricular size is normal.  3. Left atrial size was mildly dilated.  4. Right atrial size was mildly dilated.  5. Moderate pleural effusion in both left and right lateral regions.  6. The mitral valve is  normal in structure. Mild mitral valve regurgitation. No evidence of mitral stenosis.  7. The aortic valve is tricuspid. There is moderate calcification of the aortic valve. Aortic valve regurgitation is not visualized. Mild to moderate aortic valve sclerosis/calcification is present, without any evidence of aortic stenosis.  8. The inferior vena cava is normal in size with <50% respiratory variability, suggesting right atrial pressure of 8 mmHg. Comparison(s): No prior Echocardiogram. FINDINGS  Left Ventricle: Left ventricular ejection fraction, by estimation, is <20%. The left ventricle has severely decreased function. The left ventricle demonstrates global hypokinesis. The left ventricular internal cavity size was normal in size. There is no  left ventricular hypertrophy. Left ventricular diastolic parameters are indeterminate. Right Ventricle: The right ventricular size is normal. No increase in right ventricular wall thickness. Right ventricular systolic function is moderately reduced. Left Atrium: Left atrial size was mildly dilated. Right Atrium: Right atrial size was mildly dilated. Pericardium: Trivial pericardial effusion is present. Mitral Valve: The mitral valve is normal in structure. Mild mitral valve regurgitation. No evidence of mitral valve stenosis. Tricuspid Valve: The tricuspid valve is grossly normal. Tricuspid valve regurgitation is trivial. Aortic Valve: The aortic valve is tricuspid. There is moderate calcification of the aortic valve. Aortic valve regurgitation is not visualized. Mild to moderate aortic valve sclerosis/calcification is present, without any evidence of aortic stenosis. Pulmonic Valve: The pulmonic valve was grossly normal. Pulmonic valve regurgitation is mild. No evidence of pulmonic stenosis. Aorta: The aortic root, ascending aorta and aortic arch are all structurally normal, with no evidence of dilitation or obstruction. Venous: The inferior vena cava is normal in size with  less than 50% respiratory variability, suggesting right atrial pressure of 8 mmHg. IAS/Shunts: The atrial septum is grossly normal. Additional Comments: There is a moderate pleural effusion in both left and right lateral regions.  LEFT VENTRICLE PLAX 2D LVIDd:         4.70 cm      Diastology LVIDs:         4.30 cm      LV e' medial:    6.05 cm/s LV PW:         0.90 cm      LV E/e' medial:  9.9 LV IVS:        0.80 cm      LV e' lateral:   10.50 cm/s LVOT diam:     1.90 cm      LV E/e' lateral: 5.7 LV SV:         23 LV SV Index:   15 LVOT Area:     2.84 cm  LV Volumes (MOD) LV vol d, MOD A2C: 85.8 ml LV vol d, MOD A4C: 117.0 ml LV vol s, MOD A2C: 61.5 ml LV vol s, MOD A4C: 75.8 ml LV SV MOD A2C:     24.3 ml LV SV MOD A4C:     117.0 ml LV SV MOD  BP:      37.1 ml RIGHT VENTRICLE RV S prime:     7.56 cm/s TAPSE (M-mode): 0.8 cm LEFT ATRIUM           Index       RIGHT ATRIUM           Index LA diam:      4.40 cm 2.81 cm/m  RA Area:     13.60 cm LA Vol (A2C): 37.9 ml 24.16 ml/m RA Volume:   33.30 ml  21.23 ml/m LA Vol (A4C): 37.1 ml 23.65 ml/m  AORTIC VALVE LVOT Vmax:   55.03 cm/s LVOT Vmean:  32.867 cm/s LVOT VTI:    0.080 m  AORTA Ao Root diam: 2.60 cm Ao Asc diam:  2.60 cm MITRAL VALVE MV Area (PHT): 2.91 cm    SHUNTS MV Decel Time: 261 msec    Systemic VTI:  0.08 m MV E velocity: 59.60 cm/s  Systemic Diam: 1.90 cm MV A velocity: 35.30 cm/s MV E/A ratio:  1.69 Buford Dresser MD Electronically signed by Buford Dresser MD Signature Date/Time: 09/21/2020/11:13:59 AM    Final      Assessment and Plan:   New Onset Acute Systolic CHF - Patient presented with sudden onset of shortness of breath. - BNP 1,800s.  - Chest CTA showed moderate to large bilateral pleural effusions. - Echo showed LVEF of <<20% with global hypokinesis. RV normal in size with moderately reduced systolic function.  - Patient received 1 dose of IV Lasix $Remove'40mg'qXgGZeZ$  in the ED earlier this morning with almost 1 L of urine output so  far. - Will start PO Lasix $Remove'20mg'STnozAU$  daily. - Continue Losartan $RemoveBeforeDEI'25mg'[id number]$  daily. Patient struggled with hypotension during last admission and he is very frail so will be slow in introducing GDMT. If BP tolerates Losartan, can add low dose Toprol-XL tomorrow. - Etiology unclear. Suspect ischemic in nature given multiple risk factors including PAD, hypertension, and hyperlipidemia. However, patient has cerebral palsy with very poor functional status and multiple comorbodities. He is followed by Palliative Care as outpatient and there have been discussions of possibly transitioning to hospice. Therefore not a cardiac catheterization candidate. Recommend medical therapy.  Bilateral Pleural Effusions - Chest CTA showed moderate to large bilateral pleural effusion (right > left).  - S/p right thoracentesis today with removal of 900cc of fluid. Cytology showed reactive mesothelial cells but no malignant cells. Culture negative so far. - Management per PCCM.  Acute PE History of Right Subclavian DVT in 07/2020 - Chest CTA showed suspected segmental/subsegmental PE within the branches of the right lower lobe pulmonary artery (equivocal/indeterminate given quality of study but favored to reflect a true finding). Overall clot burden small with no evidence of right heart strain. - Currently on IV Heparin. Patient supposed to be on Eliquis at home. Per PCCM note, "Would place on NoAC for life (I am not sure this represents NoAC failure, would need to review compliance PTA but true NoAC failure is pretty rare)." Can transition back to Eliquis when it is clear no other procedures needed.  PVC - Frequent PVCs and short runs of NSVT noted on telemetry. - Potassium 3.7.  - Will check Magnesium. - May add Toprol-XL tomorrow if BP tolerates Losartan today.  PAD with Wound on Left Heel - S/p right above knee amputation in 07/2020. Presented with left heel wound. - Blood cultures pending. - Primary team has ordered ABIs -  Wound Care consulted.  Hypertension - BP well controlled and stable without  any antihypertensives. Had problems with hypotension during last admission in 07/2020. - Continue to monitor.  Hyperlipidemia - Continue Crestor $RemoveBeforeDE'20mg'[id number]$  daily.  Otherwise, per primary team: - Cerebral palsy - Severe protein calorie malnutrition   Risk Assessment/Risk Scores:   New York Heart Association (NYHA) Functional Class NYHA Class IV   For questions or updates, please contact [facility] Please consult [email or web address] for contact info under    Signed, Darreld Mclean, PA-C  09/21/2020 2:09 PM    Attending Note:   The patient was seen and examined.  Agree with assessment and plan as noted above.  Changes made to the above note as needed.  Patient seen and independently examined with Sande Rives, PA .   We discussed all aspects of the encounter. I agree with the assessment and plan as stated above.     Chronic combined CHF:  Pt presents with worsening dyspnea.  Was found to have an acute pulmonary embolus.   Was also found to have systolic dysfunction with EF < 20%.    He is bed / wheelchair bound.  Has severe cerebral palsy.  He has difficulty swallowing and speaking and has hx of chronic aspiration .   He is not a candidate for any invasive cardiac procedures.   I do suspect that he has CAD given his hx of severe PAD .  He has a hx of severe hypotension during his previous hospitalization . Will start Losartan 25 mg a day and follow.   Would consider adding Toprol XL 12.5 mg a day tomorrow if BP allows   2.     Pulmonary embolus:    he was supposed to be on eliquis but its unclear if he is on it.    3.  PVCs:  has occasional PVCs.  Likely related to his severely reduced LV function.  Will add Toprol XL (12.5 mg to start) to see if this helps with his PVCs.   4.  PAD : s/p right AKA   5.  Cerebral palsy:   is severely contracted.  Wheelchair / bed bound.  Has chronic aspiration .  He is  followed by palliative care. Will see if he improves with medical therapy.  If he declines  further, I would support hospice care   I have spent a total of 40 minutes with patient reviewing hospital  notes , telemetry, EKGs, labs and examining patient as well as establishing an assessment and plan that was discussed with the patient.  > 50% of time was spent in direct patient care.    Thayer Headings, Brooke Bonito., MD, [facility] 09/21/2020, 4:03 PM [address]  [place] Pager [phone]

## 2020-09-21 NOTE — Progress Notes (Signed)
FPTS Interim Progress Note  S:  Patient indicates breathing greatly improved.  Has no other complaints at this time.  O: BP 113/82 (BP Location: Left Arm)   Pulse (!) 101   Temp 97.6 F (36.4 C) (Oral)   Resp 20   Ht $R'5\' 7"'MV$  (1.702 m)   Wt 49.9 kg   SpO2 99%   BMI 17.23 kg/m    Physical Exam HENT:     Head: Atraumatic.     Comments: Swelling on right side of head, chronic    Mouth/Throat:     Mouth: Mucous membranes are moist.  Cardiovascular:     Rate and Rhythm: Normal rate and regular rhythm.  Pulmonary:     Effort: Pulmonary effort is normal.     Breath sounds: No decreased breath sounds.  Musculoskeletal:     Right Lower Extremity: Right leg is amputated above knee.  Feet:     Comments: Heal callused and tender to palpation Neurological:     Mental Status: He is alert.     A/P: Pleural Infusion 900 mL removed with tube.  Improved on chest X-Ray.  Respiratory distress significantly improved.  Satting > 95 % on 1L [place].   - follow up Pulm recs - follow-up Pleural lab-work  New Onset HF Found on ECHO today.  Consulted Cards, recommended medical management and starting on oral lasix 20 mg.  Sees palliative outpatient.   - Palliative consult - Lasix 20 mg daily  Left Heel Pain No findings for infection or cellulitis - follow up ABI's - follow up ESR CRP   Delora Fuel, MD 09/21/2020, 7:17 PM PGY-1, [place] [facility] Service pager [phone]

## 2020-09-21 NOTE — Procedures (Signed)
Thoracentesis  Procedure Note  SAJAD GLANDER  [id number]  Apr 23, 1940  Date:09/21/20  Time:7:28 AM   Provider Performing:Cimone Fahey C Tamala Julian   Procedure: Thoracentesis with imaging guidance (38250)  Indication(s) Pleural Effusion  Consent Risks of the procedure as well as the alternatives and risks of each were explained to the patient and/or caregiver.  Consent for the procedure was obtained and is signed in the bedside chart  Anesthesia Topical only with 1% lidocaine    Time Out Verified patient identification, verified procedure, site/side was marked, verified correct patient position, special equipment/implants available, medications/allergies/relevant history reviewed, required imaging and test results available.   Sterile Technique Maximal sterile technique including full sterile barrier drape, hand hygiene, sterile gown, sterile gloves, mask, hair covering, sterile ultrasound probe cover (if used).  Procedure Description Ultrasound was used to identify appropriate pleural anatomy for placement and overlying skin marked.  Area of drainage cleaned and draped in sterile fashion. Lidocaine was used to anesthetize the skin and subcutaneous tissue.  900 cc's of straw appearing fluid was drained from the right pleural space. Catheter then removed and bandaid applied to site.   Complications/Tolerance None; patient tolerated the procedure well. Chest X-ray is ordered to confirm no post-procedural complication.   EBL Minimal   Specimen(s) Pleural fluid

## 2020-09-21 NOTE — Progress Notes (Signed)
Echocardiogram 2D Echocardiogram has been performed.  Fidel Levy 09/21/2020, 9:14 AM

## 2020-09-21 NOTE — Consult Note (Signed)
[place] Nurse Consult Note: Patient receiving care in [place] Reason for Consult: Left heal wound Wound type: Left heal stage 2 PI Pressure Injury POA: Yes Measurement: 2 cm x 2.5 cm  Wound bed: Pink moist Drainage (amount, consistency, odor)  None Periwound: dry crusted Dressing procedure/placement/frequency: Clean the foot with soap and water, rinse and pat dry. Apply a small piece of Xeroform gauze directly on the wound and secure with heal foam dressing. Change daily. Keep pressure off of the heal. Place foot in Prevalon heal lift boot. Kellie Simmering # [phone])  Monitor the wound area(s) for worsening of condition such as: Signs/symptoms of infection, increase in size, development of or worsening of odor, development of pain, or increased pain at the affected locations.   Notify the medical team if any of these develop.  Thank you for the consult. [facility] nurse will not follow at this time.   Please re-consult the [place] team if needed.  Cathlean Marseilles Tamala Julian, MSN, RN, [place], [facility], [facility] Wound Treatment Associate Pager [phone]

## 2020-09-21 NOTE — Progress Notes (Signed)
ANTICOAGULATION CONSULT NOTE - Follow Up Consult  Pharmacy Consult for heparin Indication: pulmonary embolus  No Known Allergies  Patient Measurements: Height: 5\' 7"  (170.2 cm) Weight: 49.9 kg (110 lb) IBW/kg (Calculated) : 66.1 Heparin Dosing Weight: 49.9kg  Vital Signs: Temp: 97.6 F (36.4 C) (07/06 0705) Temp Source: Oral (07/06 0705) BP: 122/84 (07/06 0815) Pulse Rate: 93 (07/06 0815)  Labs: Recent Labs    09/20/20 1825 09/20/20 1906 09/20/20 1958 09/20/20 2016 09/20/20 2303 09/21/20 0351 09/21/20 0750  HGB 12.9*  --  13.6 13.6  --   --  12.9*  HCT 41.0  --  40.0 40.0  --   --  42.4  PLT 253  --   --   --   --   --  216  APTT  --   --   --   --   --   --  33  LABPROT  --  14.9  --   --   --   --  15.1  INR  --  1.2  --   --   --   --  1.2  HEPARINUNFRC  --   --   --   --   --   --  0.32  CREATININE 0.51*  --   --   --   --   --  0.52*  TROPONINIHS  --   --   --   --  22* 24*  --     Estimated Creatinine Clearance: 52.8 mL/min (A) (by C-G formula based on SCr of 0.52 mg/dL (L)).   Medications:  Infusions:   heparin 800 Units/hr (09/21/20 0029)    Assessment: 80 yo M admitted with SOB and found to have PE and bilateral pleural effusions.  Pt was on Eliquis 2.5mg  BID PTA for hx DVT (last dose 7/5 at 0800).  Pt was initiated on heparin infusion 7/5 PM.  Of note, heparin was held briefly this AM for thoracentesis.  This was done ~ 0730 this AM and heparin labs were obtained at 0750 this AM.  Suspect labs are inaccurate given heparin was held for procedure.  Goal of Therapy:  Heparin level 0.3-0.7 units/ml aPTT 66-102 seconds Monitor platelets by anticoagulation protocol: Yes   Plan:  Continue heparin at 800 units/hr. Repeat labs at 2pm today.  [employer], Pharm.D., BCPS Clinical Pharmacist Clinical phone for 09/21/2020 from 7:30-3:00 is [phone].  **Pharmacist phone directory can be found on [email or web address] listed under [place].  09/21/2020 9:49  AM

## 2020-09-21 NOTE — Progress Notes (Signed)
ANTICOAGULATION CONSULT NOTE - Follow Up Consult  Pharmacy Consult for heparin Indication: pulmonary embolus  No Known Allergies  Patient Measurements: Height: 5\' 7"  (170.2 cm) Weight: 49.9 kg (110 lb) IBW/kg (Calculated) : 66.1 Heparin Dosing Weight: 49.9kg  Vital Signs: Temp: 97.6 F (36.4 C) (07/06 0705) Temp Source: Oral (07/06 0705) BP: 121/99 (07/06 1526) Pulse Rate: 100 (07/06 1526)  Labs: Recent Labs    09/20/20 1825 09/20/20 1906 09/20/20 1958 09/20/20 2016 09/20/20 2303 09/21/20 0351 09/21/20 0750 09/21/20 1445  HGB 12.9*  --  13.6 13.6  --   --  12.9*  --   HCT 41.0  --  40.0 40.0  --   --  42.4  --   PLT 253  --   --   --   --   --  216  --   APTT  --   --   --   --   --   --  33 36  LABPROT  --  14.9  --   --   --   --  15.1  --   INR  --  1.2  --   --   --   --  1.2  --   HEPARINUNFRC  --   --   --   --   --   --  0.32 0.28*  CREATININE 0.51*  --   --   --   --   --  0.52*  --   TROPONINIHS  --   --   --   --  22* 24*  --   --      Estimated Creatinine Clearance: 52.8 mL/min (A) (by C-G formula based on SCr of 0.52 mg/dL (L)).   Medications:  Infusions:   heparin 800 Units/hr (09/21/20 0029)    Assessment: 80 yo M admitted with SOB and found to have PE and bilateral pleural effusions.  Pt was on Eliquis 2.5mg  BID PTA for hx DVT (last dose 7/5 at 0800).  Pt was initiated on heparin infusion 7/5 PM.    Heparin level and aPTT subtherapeutic, likely correlating at this point on 800 units/hr  Goal of Therapy:  Heparin level 0.3-0.7 units/ml aPTT 66-102 seconds Monitor platelets by anticoagulation protocol: Yes   Plan:  Give heparin 2000 units IV x 1 and increase gtt to 950 units/hr F/u 8 hour heparin level F/u long term [facility] plan  Bertis Ruddy, PharmD Clinical Pharmacist ED Pharmacist Phone # [phone] 09/21/2020 4:32 PM

## 2020-09-21 NOTE — H&P (Addendum)
[facility] Admission History and Physical Service Pager: [phone]  Patient name: Rick Schwartz Medical record number: [id number] Date of birth: [birth year] Age: 80 y.o. Gender: male  Primary Care Provider: Lurline Del, DO Consultants: Pulmonary code Status: DNR, patient has paperwork on file from May 2022, confirmed with sister Rick Schwartz Preferred Emergency Contact: Rick Schwartz, [phone], sister   Chief Complaint: shortness of breath  Assessment and Plan: Rick Schwartz is a 80 y.o. male presenting with shortness of breath. Tachycardia, and tachypnea. Found to have PE and bilateral pleural effusions. PMHx is significant for cerebral palsy, HTN, foot ulcer, severe protein calorie malnutrition, DVT, prostate cancer, PAD, and HLD.  Pulmonary embolism  Hx of DVT (Eliquis)  Patient found to have right lower lobe pulmonary embolism without signs of right heart strain.D-dimer elevated at 1.68. Pt has tachycardia (110), tachypnic (40 breaths per mins). Heparin gtt started. Patient previously on Eliquis at the time of admission for hx of DVT with known hx of PAD and right BKA. Of note, patient reports hx of nonactive prostate cancer. -Admit to progressive, observation, attending Dr. Belcourt Grayer - CBC daily - CMP ordered  - APTT ordered - INR ordered - Heparin 800 units/hr - f/u Heparin level  - Acetaminophen 650 mg q6h PRN for pain  -PT/OT -Vitals per floor protocol  Bilateral pleural effusions CTA notable for bilateral pleural effusions with right greater than left. O2 requirement of 2 L.  Consider heart failure and malignancy as causes of pulmonary effusions. Infection less likely as he is afebrile and normal WBC count. BNP 1,865.8. Troponin I 24, up from 22 on 7/5. - Consulted pulmonary critical care with plan for thoracentesis - Follow-up pulmonary/critical care recommendations - Echocardiogram ordered - Blood cultures ordered - Will hold hep gtt  for thoracentesis procedure   Wound on L heel Open ulcerated wound, about 3 cm in diameter, on L heel. It is not draining, no erythema or swelling in surrounding areas. See photo in media. Patient presented afebrile, no leukocytosis. -ABI's ordered given patient's history of peripheral arterial disease -Consider MRI for evaluation of potential osteomyelitis -Wound care consult placed  Severe protein calorie malnutrition Pt drinks Prosource plus TID between meals and takes ascorbic acid 1000 mg daily.  Family reports that patient needs a regular diet at home. -Heart healthy diet -Nutrition consult  HLD Pt takes Crestor 20 mg daily -Continue home Crestor 20 mg   FEN/GI: Heart healthy diet Prophylaxis: Heparin drip  Disposition: Progressive  History of Present Illness:  Rick Schwartz is a 80 y.o. male presenting with sudden onset of shortness of breath.  Patient states that 1 day prior to admission, he had sudden onset of shortness of breath.  He denies any supplemental oxygen use at home.  Patient denies any recent cough or respiratory symptoms.  He denies any recent fevers or GI upset.  Review Of Systems: Per HPI with the following additions:   Review of Systems  Constitutional:  Negative for appetite change, chills and fever.  HENT:  Negative for congestion, ear pain, rhinorrhea and sore throat.   Eyes:  Negative for visual disturbance.  Respiratory:  Positive for shortness of breath. Negative for cough.   Cardiovascular:  Negative for chest pain and palpitations.  Gastrointestinal:  Negative for abdominal pain, diarrhea, nausea and vomiting.  Genitourinary:  Negative for dysuria.  Skin:  Positive for wound. Negative for rash.  Neurological:  Negative for dizziness and headaches.    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   PROSTATE CANCER 09/18/2007   GLUCOSE INTOLERANCE 05/16/2006   HYPERCHOLESTEROLEMIA 05/16/2006   Infantile cerebral palsy ([place]) 05/16/2006   HYPERTENSION, BENIGN SYSTEMIC 05/16/2006   BPH 05/16/2006    Past Medical History: Past Medical History:  Diagnosis Date   Cancer ([place])    Prostate   Cerebral palsy ([place])    Hyperlipidemia    Hypertension    Peripheral arterial disease ([place])    critical limb ischemia    Past Surgical History: Past Surgical History:  Procedure Laterality Date   AMPUTATION Right 07/22/2020   Procedure: RIGHT ABOVE KNEE AMPUTATION;  Surgeon: [name], MD;  Location: [facility];  Service: Orthopedics;  Laterality: Right;   PROSTATE SURGERY      Social History: Social History   Tobacco Use   Smoking status: Former    Packs/day: 0.25    Years: 35.00    Pack years: 8.75    Types: Cigarettes    Quit date: 10/12/1994    Years since quitting: 25.9   Smokeless tobacco: Never  Vaping Use   Vaping Use: Never used  Substance Use Topics   Alcohol use: Yes    Comment: Occasional beer and gin   Drug use: No    Family History: Family History  Problem Relation Age of Onset   Diabetes Mother    Hypertension Mother    Hypertension Father    Diabetes Sister    Hypertension Sister    Diabetes Brother    Hypertension Brother    Diabetes Sister    Thyroid disease Sister    Sudden death Brother     Allergies and Medications: No Known Allergies No current facility-administered medications on file prior to encounter.   Current Outpatient Medications on File Prior to  Encounter  Medication Sig Dispense Refill   apixaban (ELIQUIS) 2.5 MG TABS tablet Take 1 tablet (2.5 mg total) by mouth 2 (two) times daily. Starting 5/17 60 tablet    apixaban (ELIQUIS) 5 MG TABS tablet Take 1 tablet (5 mg total) by mouth 2 (two) times daily. Until 5/16 1 tablet    ascorbic acid (VITAMIN C) 1000 MG tablet Take 1 tablet (1,000 mg total) by mouth daily.     aspirin EC 81 MG tablet Take 81 mg by mouth at bedtime. Swallow whole.     Nutritional Supplements (,FEEDING SUPPLEMENT, PROSOURCE PLUS) liquid Take 30 mLs by mouth 3 (three) times daily between meals.     Nystatin (GERHARDT'S BUTT CREAM) CREA Apply 1 application topically 3 (three) times daily.     potassium chloride SA (KLOR-CON) 10 MEQ tablet Take 1 tablet (10 mEq total) by mouth daily. 30 tablet 0   rosuvastatin (CRESTOR) 20 MG tablet Take 1 tablet (20 mg total) by mouth daily. 30 tablet 0    Objective: BP 111/76   Pulse 98   Temp (!) 97.4 F (36.3 C) (Oral)   Resp (!) 34   Ht 5'  7" (1.702 m)   Wt 49.9 kg   SpO2 100%   BMI 17.23 kg/m   Exam: General: Elderly male, thin and frail appearing, right hand contracture Eyes: No scleral icterus, extraocular muscles intact bilaterally Cardiovascular: Regular rate and rhythm Respiratory: Increased work of breathing, dyspneic with conversation, increased respiratory rate, nasal cannula on 2 L, oxygen saturations appropriate ranging from 98-100 Gastrointestinal: Soft, nontender, nondistended, bowel sounds present MSK: Moves left upper extremity with normal range of motion, 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    Recent Labs  Lab 09/20/20 1825 09/20/20 1958 09/20/20 2016  NA 140   < > 143  K 3.6   < > 3.6  CL 110  --   --   CO2 23  --   --   BUN 9  --   --   CREATININE 0.51*  --   --   GLUCOSE 122*  --   --   CALCIUM 8.6*  --   --    < > = values in this interval not displayed.     EKG: Tachycardia, no signs of ischemia  CT angio chest: Pulmonary embolus, bilateral pleural effusions with right greater than left, compressive atelectasis bilaterally  Precious Gilding, DO 09/21/2020, 12:50 AM PGY-1, [place] Intern pager: [phone], text pages welcome    FPTS Upper-Level Resident Addendum   I have independently interviewed and examined the patient. I have discussed the above with Dr. Ronnald Ramp and agree with the documented plan. My edits for correction/addition/clarification are included above. Please see any attending notes.   Eulis Foster, MD PGY-3, [facility] 09/21/2020 8:04 AM  FPTS Service pager: [phone] (text pages welcome through [facility])

## 2020-09-21 NOTE — ED Notes (Addendum)
PT's gown and bed linen changed. Pt depends replaced. Warm blankets given. Heel elevated.

## 2020-09-21 NOTE — Progress Notes (Signed)
PT Cancellation Note  Patient Details Name: Rick Schwartz MRN: [id number] DOB: [birth year]   Cancelled Treatment:    Reason Eval/Treat Not Completed: Medical issues which prohibited therapy Pt found to have new PE and started on IV Heparin.  Per PT guidelines, needs to be on heparin for >24 hr.  Will hold eval for 24 hr Heparin. Abran Richard, PT Acute Rehab Services Pager [phone] [facility] [place] 09/21/2020, 9:36 AM

## 2020-09-21 NOTE — Consult Note (Addendum)
NAME:  Rick Schwartz, MRN:  [id number], DOB:  January 16, 1941, LOS: 0 ADMISSION DATE:  09/20/2020, CONSULTATION DATE:  09/21/20 REFERRING MD:  Jolin Grayer, CHIEF COMPLAINT:  SOB   History of Present Illness:  80 year old man w/ hx of cerebral palsy presenting with worsening SOB x 1-2 days.  Worse on back.  History mostly per sister as patient has poor insight.  No chest pain, cough, hemoptysis, fevers.  Imaging reveals PE, pleural effusions.  PCCM consulted for pleural effusion with dyspnea.  Pertinent  Medical History  Prostate ca PVD HLD HTN Cerebral palsy  Significant Hospital Events: Including procedures, antibiotic start and stop dates in addition to other pertinent events     Interim History / Subjective:  7/5 admitted  Objective   Blood pressure 108/82, pulse 95, temperature 97.6 F (36.4 C), temperature source Oral, resp. rate (!) 38, height 5\' 7"  (1.702 m), weight 49.9 kg, SpO2 96 %.       No intake or output data in the 24 hours ending 09/21/20 0730 Filed Weights   09/20/20 1809  Weight: 49.9 kg    Examination: General: frail elderly man in NAD HENT: temporal wasting, MMM Lungs: Diminished bases, R>L simple appearing effusions Cardiovascular: RRR, ext warm Abdomen: soft,+BS Extremities: muscle wasting, minimal dependent edema, has R AKA; L heel ulcer Neuro: moves but extremely weak GU: dark urine from condom cath  Resolved Hospital Problem list   N/a  Assessment & Plan:  Acute respiratory distress due to combination pulmonary embolus and pleural effusions.  Effusions related to poor nutrition and third spacing but with prostate ca hx can check for cyto.  Pulmonary venous htn aka heart failure also a consideration.  PE/DVT related to sedentary state although he was supposed to be on eliquis? - Tap R effusion for diagnostic/therapeutic purposes - Encourage protein nutrition however much possible - Reasonable to try diuretics as BP and kidneys tolerate: will order 40mg   lasix and 70mEq potassium, further per primary - Would place on NoAC for life (I am not sure this represents NoAC failure, would need to review compliance PTA but true NoAC failure is pretty rare) - Should he continue to have admissions, he would be better served in hospice  I will f/u cyto and reach out to family should anything concerning arise PCCM available PRN  Best Practice (right click and "Reselect all SmartList Selections" daily)  Per Primary  Labs   CBC: Recent Labs  Lab 09/20/20 1825 09/20/20 1958 09/20/20 2016  WBC 6.4  --   --   NEUTROABS 4.4  --   --   HGB 12.9* 13.6 13.6  HCT 41.0 40.0 40.0  MCV 92.8  --   --   PLT 253  --   --     Basic Metabolic Panel: Recent Labs  Lab 09/20/20 1825 09/20/20 1958 09/20/20 2016  NA 140 144 143  K 3.6 3.6 3.6  CL 110  --   --   CO2 23  --   --   GLUCOSE 122*  --   --   BUN 9  --   --   CREATININE 0.51*  --   --   CALCIUM 8.6*  --   --    GFR: Estimated Creatinine Clearance: 52.8 mL/min (A) (by C-G formula based on SCr of 0.51 mg/dL (L)). Recent Labs  Lab 09/20/20 1811 09/20/20 1825 09/20/20 2009  WBC  --  6.4  --   LATICACIDVEN 2.1*  --  1.8  Liver Function Tests: Recent Labs  Lab 09/20/20 1825  AST 15  ALT 13  ALKPHOS 60  BILITOT 0.8  PROT 5.6*  ALBUMIN 2.4*   No results for input(s): LIPASE, AMYLASE in the last 168 hours. No results for input(s): AMMONIA in the last 168 hours.  ABG    Component Value Date/Time   PHART 7.456 (H) 07/23/2020 0209   PCO2ART 34.6 07/23/2020 0209   PO2ART 112 (H) 07/23/2020 0209   HCO3 22.3 09/20/2020 2016   TCO2 23 09/20/2020 2016   ACIDBASEDEF 1.0 09/20/2020 2016   O2SAT 96.0 09/20/2020 2016     Coagulation Profile: Recent Labs  Lab 09/20/20 1906  INR 1.2    Cardiac Enzymes: No results for input(s): CKTOTAL, CKMB, CKMBINDEX, TROPONINI in the last 168 hours.  HbA1C: Hemoglobin A1C  Date/Time Value Ref Range Status  06/10/2017 04:20 PM 6.0  Final   07/20/2016 10:52 AM 6.4  Final   Hgb A1c MFr Bld  Date/Time Value Ref Range Status  09/29/2019 10:51 AM 5.6 4.8 - 5.6 % Final    Comment:             Prediabetes: 5.7 - 6.4          Diabetes: >6.4          Glycemic control for adults with diabetes: <7.0     CBG: No results for input(s): GLUCAP in the last 168 hours.  Review of Systems:   Limited due to cerebral palsy; per sister, neg 13 point ROS except weakness, fatigue (chronic), dyspnea, and orthopnea  Past Medical History:  He,  has a past medical history of Cancer ([place]), Cerebral palsy ([place]), Hyperlipidemia, Hypertension, and Peripheral arterial disease ([place]).   Surgical History:   Past Surgical History:  Procedure Laterality Date   AMPUTATION Right 07/22/2020   Procedure: RIGHT ABOVE KNEE AMPUTATION;  Surgeon: [name], MD;  Location: [facility];  Service: Orthopedics;  Laterality: Right;   PROSTATE SURGERY       Social History:   reports that he quit smoking about 25 years ago. He has a 8.75 pack-year smoking history. He has never used smokeless tobacco. He reports current alcohol use. He reports that he does not use drugs.   Family History:  His family history includes Diabetes in his brother, mother, sister, and sister; Hypertension in his brother, father, mother, and sister; Sudden death in his brother; Thyroid disease in his sister.   Allergies No Known Allergies   Home Medications  Prior to Admission medications   Medication Sig Start Date End Date Taking? Authorizing Provider  apixaban (ELIQUIS) 2.5 MG TABS tablet Take 1 tablet (2.5 mg total) by mouth 2 (two) times daily. Starting 5/17 08/02/20  Yes Gifford Shave, MD  ascorbic acid (VITAMIN C) 1000 MG tablet Take 1 tablet (1,000 mg total) by mouth daily. 07/28/20  Yes Gifford Shave, MD  aspirin EC 81 MG tablet Take 81 mg by mouth at bedtime. Swallow whole.   Yes [provider]  Nutritional Supplements (,FEEDING SUPPLEMENT, PROSOURCE PLUS)  liquid Take 30 mLs by mouth 3 (three) times daily between meals. 07/27/20  Yes Gifford Shave, MD  Nystatin (GERHARDT'S BUTT CREAM) CREA Apply 1 application topically 3 (three) times daily. 07/27/20  Yes Gifford Shave, MD  potassium chloride SA (KLOR-CON) 10 MEQ tablet Take 1 tablet (10 mEq total) by mouth daily. 07/27/20  Yes Gifford Shave, MD  rosuvastatin (CRESTOR) 20 MG tablet Take 1 tablet (20 mg total) by mouth daily. 07/27/20  Yes Gifford Shave, MD  apixaban (ELIQUIS) 5 MG TABS tablet Take 1 tablet (5 mg total) by mouth 2 (two) times daily. Until 5/16 Patient not taking: No sig reported 07/27/20   Gifford Shave, MD

## 2020-09-22 ENCOUNTER — Inpatient Hospital Stay (HOSPITAL_COMMUNITY): Payer: Medicare Other

## 2020-09-22 ENCOUNTER — Encounter (HOSPITAL_COMMUNITY): Payer: Self-pay | Admitting: Family Medicine

## 2020-09-22 DIAGNOSIS — L8990 Pressure ulcer of unspecified site, unspecified stage: Secondary | ICD-10-CM | POA: Insufficient documentation

## 2020-09-22 DIAGNOSIS — L0390 Cellulitis, unspecified: Secondary | ICD-10-CM

## 2020-09-22 DIAGNOSIS — I739 Peripheral vascular disease, unspecified: Secondary | ICD-10-CM

## 2020-09-22 DIAGNOSIS — E43 Unspecified severe protein-calorie malnutrition: Secondary | ICD-10-CM | POA: Insufficient documentation

## 2020-09-22 HISTORY — DX: Pressure ulcer of unspecified site, unspecified stage: L89.90

## 2020-09-22 LAB — BASIC METABOLIC PANEL
Anion gap: 9 (ref 5–15)
BUN: 11 mg/dL (ref 8–23)
CO2: 23 mmol/L (ref 22–32)
Calcium: 9 mg/dL (ref 8.9–10.3)
Chloride: 111 mmol/L (ref 98–111)
Creatinine, Ser: 0.57 mg/dL — ABNORMAL LOW (ref 0.61–1.24)
GFR, Estimated: >60 mL/min (ref >60)
Glucose, Bld: 100 mg/dL — ABNORMAL HIGH (ref 70–99)
Potassium: 3.6 mmol/L (ref 3.5–5.1)
Sodium: 143 mmol/L (ref 135–145)

## 2020-09-22 LAB — APTT: aPTT: 33 seconds (ref 24–36)

## 2020-09-22 LAB — CBC
HCT: 41 % (ref 39.0–52.0)
Hemoglobin: 13 g/dL (ref 13.0–17.0)
MCH: 28.6 pg (ref 26.0–34.0)
MCHC: 31.7 g/dL (ref 30.0–36.0)
MCV: 90.1 fL (ref 80.0–100.0)
Platelets: 164 10*3/uL (ref 150–400)
RBC: 4.55 MIL/uL (ref 4.22–5.81)
RDW: 14.6 % (ref 11.5–15.5)
WBC: 5.1 10*3/uL (ref 4.0–10.5)
nRBC: 0 % (ref 0.0–0.2)

## 2020-09-22 LAB — HEPARIN LEVEL (UNFRACTIONATED): Heparin Unfractionated: 0.27 IU/mL — ABNORMAL LOW (ref 0.30–0.70)

## 2020-09-22 MED ORDER — HEPARIN BOLUS VIA INFUSION
2000.0000 [IU] | Freq: Once | INTRAVENOUS | Status: AC
  Administered 2020-09-22: 2000 [IU] via INTRAVENOUS
  Filled 2020-09-22: qty 2000

## 2020-09-22 MED ORDER — ENSURE ENLIVE PO LIQD
237.0000 mL | Freq: Two times a day (BID) | ORAL | Status: DC
  Administered 2020-09-23 – 2020-09-27 (×6): 237 mL via ORAL

## 2020-09-22 MED ORDER — ADULT MULTIVITAMIN W/MINERALS CH
1.0000 | ORAL_TABLET | Freq: Every day | ORAL | Status: DC
  Administered 2020-09-23 – 2020-09-27 (×5): 1 via ORAL
  Filled 2020-09-22 (×5): qty 1

## 2020-09-22 MED ORDER — APIXABAN 5 MG PO TABS
10.0000 mg | ORAL_TABLET | Freq: Two times a day (BID) | ORAL | Status: DC
  Administered 2020-09-22 – 2020-09-27 (×11): 10 mg via ORAL
  Filled 2020-09-22 (×11): qty 2

## 2020-09-22 MED ORDER — ACETAMINOPHEN 325 MG PO TABS
650.0000 mg | ORAL_TABLET | Freq: Four times a day (QID) | ORAL | Status: DC
  Administered 2020-09-22 – 2020-09-27 (×16): 650 mg via ORAL
  Filled 2020-09-22 (×16): qty 2

## 2020-09-22 MED ORDER — POTASSIUM CHLORIDE CRYS ER 20 MEQ PO TBCR
40.0000 meq | EXTENDED_RELEASE_TABLET | Freq: Once | ORAL | Status: AC
  Administered 2020-09-22: 40 meq via ORAL
  Filled 2020-09-22: qty 2

## 2020-09-22 MED ORDER — APIXABAN 5 MG PO TABS: 5.0000 mg | ORAL_TABLET | Freq: Two times a day (BID) | ORAL | Status: DC

## 2020-09-22 MED ORDER — ACETAMINOPHEN 650 MG RE SUPP
650.0000 mg | Freq: Four times a day (QID) | RECTAL | Status: DC
  Filled 2020-09-22: qty 1

## 2020-09-22 NOTE — Progress Notes (Signed)
FPTS Brief Progress Note  S:Pt resting comfortably in bed, no concerns.   O: BP 104/63 (BP Location: Left Arm)   Pulse 91   Temp (!) 97.5 F (36.4 C) (Oral)   Resp 17   Ht 5\' 7"  (1.702 m)   Wt 47 kg   SpO2 100%   BMI 16.23 kg/m   General:thin and frail, NAD Cardio: RRR Resp: normal WOB on room air  A/P: 80 y.o. M presented with SOB and PMHx of cerebral palsy, prostate cancer, HTN, PAD, and DVT.  - PE s/p thoracentesis for pleural effusion on R: patient transitioned from hep gtt to Eliquis 10mg  BIDx7 days then reduce to 5mg  bid - Worsening HF w/ Reduced EF: patient followed by cardiology while inpatient, on lasix 20mg  PO, continue to monitor UOP  - Disposition: expected to return home with home health PT/OT, outpatient palliative follow up  - Orders reviewed. Labs for AM ordered, which was adjusted as needed.    Precious Gilding, DO 09/23/2020, 7:13 AM PGY-1, [facility] Night Resident  Please page [phone] with questions.

## 2020-09-22 NOTE — Plan of Care (Signed)
  Problem: Education: Goal: Knowledge of General Education information will improve Description Including pain rating scale, medication(s)/side effects and non-pharmacologic comfort measures Outcome: Progressing   

## 2020-09-22 NOTE — Progress Notes (Signed)
Initial Nutrition Assessment  DOCUMENTATION CODES:   Severe malnutrition in context of chronic illness, Underweight  INTERVENTION:   -Feeding assistance with meals -Continue Ensure Enlive po BID, each supplement provides 350 kcal and 20 grams of protein  -MVI with minerals daily -Magic cup TID with meals, each supplement provides 290 kcal and 9 grams of protein  -Liberalize diet to regular  NUTRITION DIAGNOSIS:   Severe Malnutrition related to chronic illness (PAD) as evidenced by severe fat depletion, severe muscle depletion.  GOAL:   Patient will meet greater than or equal to 90% of their needs  MONITOR:   PO intake, Supplement acceptance, Labs, Weight trends, Skin, I & O's  REASON FOR ASSESSMENT:   Consult Assessment of nutrition requirement/status  ASSESSMENT:   Rick Schwartz is a 80 year old male who presented with 1 day history of sudden onset of shortness of breath.  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  He cannot state any other related symptoms.  Pt admitted with acute dyspnea secondary to bilateral pleural effusions.  7/6- s/p thoracentesis  Reviewed I/O's: +230 ml x 24 hours  UOP: 300 ml x 24 hours  Spoke with pt and sister at bedside. Both confirm that pt has a very good appetite. PTA he was consuming 2 meals per day (Breakfast: grits, eggs, and coffee; Dinner: pureed chicken and vegetables). Pt shares that he is not a picky eater and "will eat anything". Per sister, pt is not a pureed diet normally, however, was pureeing food PTA secondary to respiratory status. Pt denies any difficulty tolerating regular textured foods at this time. He consumed all of his breakfast and 75%  of Ensure supplement, which he likes  Reviewed wt hx; pt has experienced a 10% wt loss over the past 2 months, which is significant for time frame. Pt and sister confirm wt loss, but unsure of UBW of how much weight he has lost. Per sister, he started losing weight approximately 2 months ago after AKA.   Discussed importance of good meal and supplement intake to promote healing. Pt amenable to supplements.   Medications reviewed and include lasix.   Labs reviewed.   NUTRITION - FOCUSED PHYSICAL EXAM:  Flowsheet Row Most Recent Value  Orbital Region Severe depletion  Upper Arm Region Severe depletion  Thoracic and Lumbar Region Severe depletion  Buccal Region Severe depletion  Temple Region Severe depletion  Clavicle Bone Region Severe depletion  Clavicle and Acromion Bone Region Severe depletion  Scapular Bone Region Severe depletion  Dorsal Hand Severe depletion  Patellar Region Severe depletion  Anterior Thigh Region Severe depletion  Posterior Calf Region Severe depletion  Edema (RD Assessment) None  Hair Reviewed  Eyes Reviewed  Mouth Reviewed  Skin Reviewed  Nails Reviewed       Diet Order:   Diet Order             Diet Heart Room service appropriate? Yes; Fluid consistency: Thin  Diet effective now                   EDUCATION NEEDS:   Education needs have been addressed  Skin:  Skin Assessment: Skin Integrity Issues: Skin Integrity Issues:: Stage II Stage II: lt heel  Last BM:  Unknown  Height:   Ht Readings from Last 1 Encounters:  09/20/20 5\' 7"  (1.702 m)    Weight:   Wt Readings from Last 1 Encounters:  09/22/20 45 kg    BMI:  Body mass index is 15.54 kg/m.  Estimated Nutritional Needs:   Kcal:  1800-2000  Protein:  100-115 grams  Fluid:  > 1.8 L    Rick Schwartz, RD, LDN, [place] Registered Dietitian II Certified Diabetes Care and Education Specialist Please refer to [facility] for RD and/or RD on-call/weekend/after hours pager

## 2020-09-22 NOTE — Progress Notes (Signed)
ABI completed. Refer to "CV Proc" under chart review to view preliminary results.  09/22/2020 11:40 AM Kelby Aline., MHA, RVT, RDCS, RDMS

## 2020-09-22 NOTE — Progress Notes (Signed)
FPTS Brief Progress Note  S: Patient sleeping soundly in bed.  Normal work of breathing on 2 L of oxygen.   O: BP 98/65 (BP Location: Right Arm)   Pulse 90   Temp 98.4 F (36.9 C) (Oral)   Resp 18   Ht 5\' 7"  (1.702 m)   Wt 49.9 kg   SpO2 100%   BMI 17.23 kg/m   General: Sleeping comfortably in bed, no acute distress Cardio: RRR, normal S1/S2 Respiratory: Normal work of breathing on 2 L of oxygen  A/P: Bilateral pleural effusions Thoracentesis of right pleural space performed on 7/6.  Currently no work of breathing, on 2 L of oxygen, normal respiratory rate.  -Continue to follow plan outlined in day team's progress note  - Orders reviewed. Labs for AM ordered, which was adjusted as needed.    Precious Gilding, DO 09/22/2020, 1:59 AM PGY-1, [facility] Night Resident  Please page [phone] with questions.

## 2020-09-22 NOTE — Evaluation (Addendum)
Physical Therapy Evaluation Patient Details Name: Rick Schwartz MRN: [id number] DOB: Nov 29, 1940 Today's Date: 09/22/2020   History of Present Illness  80 y.o. male presents to [facility] ED on 09/20/2020 SOB, tachycardia and tachpnea. Pt found to have PE and bilateral pleural effusions. PMHx is significant for cerebral palsy, HTN, foot ulcer, severe protein calorie malnutrition, DVT, prostate cancer, PAD, and HLD.  Clinical Impression  Pt presents to PT with significant deficits in ROM, strength, functional mobility, and balance. Pt reports requiring significant physical assistance for all functional mobility as well as all ADLs. Pt is dependent for wheelchair mobility and spends most time in the bed.   After completion of PT evaluation Occupational therapist Lucille Passy received information from patient's sister, and caregiver, that the patient was living alone and ambulatory prior to AKA in May. Pt has been receiving HHPT services prior to this admission. Pt will benefit from continued acute PT services to improve functional mobility quality and balance in an effort to reduce caregiver burden. PT recommends continued HHPT services at this time.      09/22/20 0928  PT Visit Information  Last PT Received On 09/22/20  Assistance Needed +2  History of Present Illness 80 y.o. male presents to [facility] ED on 09/20/2020 SOB, tachycardia and tachpnea. Pt found to have PE and bilateral pleural effusions. PMHx is significant for cerebral palsy, HTN, foot ulcer, severe protein calorie malnutrition, DVT, prostate cancer, PAD, and HLD.  Precautions  Precautions Fall  Restrictions  Weight Bearing Restrictions No  Home Living  Family/patient expects to be discharged to: Private residence  Living Arrangements Other relatives (sister and brother-in-law)  Available Help at Discharge Family;Available 24 hours/day  Type of Home House  Home Access Level entry  Home Layout One level  Bathroom Shower/Tub Tub/shower unit   Home Equipment Wheelchair - manual;Hospital bed;BSC;Tub bench  Prior Function  Level of Independence Needs assistance  Gait / Transfers Assistance Needed pt requires max-totalA for all bed mobility and transfers. Pt is pushed by family in manual wheelchair and reports he does often spend time in his wheelchair but not daily  ADL's / [place] Pt requires assistance for all ADLs  Communication  Communication Expressive difficulties  Pain Assessment  Pain Assessment Faces  Faces Pain Scale 2  Pain Location L heel  Pain Descriptors / Indicators Grimacing  Pain Intervention(s) Monitored during session  Cognition  Arousal/Alertness Awake/alert  Behavior During Therapy WFL for tasks assessed/performed  Overall Cognitive Status No family/caregiver present to determine baseline cognitive functioning  General Comments pt with slowed processing, follows one-step commands well.  Upper Extremity Assessment  Upper Extremity Assessment RUE deficits/detail;LUE deficits/detail  RUE Deficits / Details Pt with R shoulder internal rotation, R elbow flexion, R wrist and digit flexion contractures, grossly limiting funcitonal mobility and use of RUE  LUE Deficits / Details Pt with LUE flexion contractures but with increased mobility of elbow and shoulder compared to RUE  Lower Extremity Assessment  Lower Extremity Assessment LLE deficits/detail  LLE Deficits / Details knee flexion limited to ~90 degrees passively  Cervical / Trunk Assessment  Cervical / Trunk Assessment Kyphotic  Bed Mobility  Overal bed mobility Needs Assistance  Bed Mobility Supine to Sit  Supine to sit Total assist  Transfers  Overall transfer level Needs assistance  Equipment used 1 person hand held assist  Transfers Stand Pivot Transfers;Sit to/from Stand  Sit to Stand Total assist  Stand pivot transfers Total assist  Balance  Overall balance assessment  Needs assistance  Sitting-balance support Single  extremity supported;Feet supported  Sitting balance-Leahy Scale Zero  Sitting balance - Comments maxA, posterior lean  Standing balance support Bilateral upper extremity supported  Standing balance-Leahy Scale Zero  Standing balance comment totalA  General Comments  General comments (skin integrity, edema, etc.) VSS on RA, pt with L heel wound covered by dressing  PT - End of Session  Activity Tolerance Patient tolerated treatment well  Patient left in chair;with call bell/phone within reach;with chair alarm set  Nurse Communication Mobility status  PT Assessment  PT Recommendation/Assessment Patient needs continued PT services  PT Visit Diagnosis Other abnormalities of gait and mobility (R26.89);Muscle weakness (generalized) (M62.81);Other symptoms and signs involving the nervous system (R29.898)  PT Problem List Decreased balance;Decreased mobility;Decreased range of motion;Decreased strength;Decreased activity tolerance  PT Plan  PT Frequency (ACUTE ONLY) Min 2X/week  PT Treatment/Interventions (ACUTE ONLY) DME instruction;Functional mobility training;Therapeutic activities;Therapeutic exercise;Balance training;Patient/family education  AM-PAC PT "6 Clicks" Mobility Outcome Measure (Version 2)  Help needed turning from your back to your side while in a flat bed without using bedrails? 2  Help needed moving from lying on your back to sitting on the side of a flat bed without using bedrails? 1  Help needed moving to and from a bed to a chair (including a wheelchair)? 1  Help needed standing up from a chair using your arms (e.g., wheelchair or bedside chair)? 1  Help needed to walk in hospital room? 1  Help needed climbing 3-5 steps with a railing?  1  6 Click Score 7  Consider Recommendation of Discharge To: CIR/SNF/LTACH  PT Recommendation  Follow Up Recommendations Supervision/Assistance - 24 hour;Home health PT  PT equipment None recommended by PT  PT Time Calculation  PT Start  Time (ACUTE ONLY) 0903  PT Stop Time (ACUTE ONLY) 8453  PT Time Calculation (min) (ACUTE ONLY) 25 min  PT General Charges  $$ ACUTE PT VISIT 1 Visit  PT Evaluation  $PT Eval Low Complexity 1 Low  Written Expression  Dominant Hand Left        Zenaida Niece, PT, DPT Acute Rehabilitation Pager: [phone]   Zenaida Niece 09/22/2020, 10:24 AM

## 2020-09-22 NOTE — Progress Notes (Addendum)
Nurse received call form lab regarding results of pleural fluid positive for staphylococcus.bed side nurse aware.   Date results received: 09/22/20 1400 Test: pleural fluid Critical Value: Staphylococcus positive  Name of Provider Notified: Maness MD

## 2020-09-22 NOTE — Progress Notes (Signed)
Progress Note  Patient Name: Rick Schwartz Date of Encounter: 09/22/2020  [facility] Cardiologist: Gwenlyn Found, / Nathanael Krist   Subjective   80 year old gentleman with a history of severe cerebral palsy, peripheral arterial disease who was admitted with respiratory failure.  Was found to have a new pulmonary embolus.  Echocardiogram revealed a new severe reduction in his left ventricular systolic function with an EF of less than 20%.  Pt appears stable .  Unable to tell me if he feels any better this am  Examined sitting up in the chair We started Losartan 25 mg a day yesterday  Planned on adding Toprol XL 12.5 mg a day when his BP is better. BP is still on the low side this am   Inpatient Medications    Scheduled Meds:  furosemide  20 mg Oral Daily   Gerhardt's butt cream  1 application Topical TID   losartan  25 mg Oral Daily   rosuvastatin  20 mg Oral Daily   Continuous Infusions:  heparin 1,100 Units/hr (09/22/20 0518)   PRN Meds: acetaminophen **OR** acetaminophen   Vital Signs    Vitals:   09/21/20 2018 09/21/20 2351 09/22/20 0308 09/22/20 0737  BP: 114/79 98/65 101/60 100/67  Pulse: 90 90 91 89  Resp: 18 18 17 19   Temp: (!) 97.5 F (36.4 C) 98.4 F (36.9 C) (!) 97.5 F (36.4 C) 97.7 F (36.5 C)  TempSrc: Oral Oral Oral Oral  SpO2: 100% 100% 100% 100%  Weight:   45 kg   Height:        Intake/Output Summary (Last 24 hours) at 09/22/2020 0912 Last data filed at 09/22/2020 0814 Gross per 24 hour  Intake 585 ml  Output 300 ml  Net 285 ml   Last 3 Weights 09/22/2020 09/20/2020 08/01/2020  Weight (lbs) 99 lb 3.3 oz 110 lb 110 lb  Weight (kg) 45 kg 49.896 kg 49.896 kg      Telemetry    NSR 85-90- Personally Reviewed  ECG     - Personally Reviewed  Physical Exam    GEN:  elderly man,  right arm/ hand contracted.  Has cerebral palsy Neck: No JVD Cardiac: RRR,  Respiratory: Clear to auscultation bilaterally. GI: Soft, nontender, non-distended  MS:  s/p R  AKA,  Right arm/ hand is severely contracted.  Neuro:  c/w severe cerebral palsy  Psych: Normal affect   Labs    High Sensitivity Troponin:   Recent Labs  Lab 09/20/20 2303 09/21/20 0351  TROPONINIHS 22* 24*      Chemistry Recent Labs  Lab 09/20/20 1825 09/20/20 1958 09/20/20 2016 09/21/20 0750 09/22/20 0336  NA 140   < > 143 140 143  K 3.6   < > 3.6 3.7 3.6  CL 110  --   --  112* 111  CO2 23  --   --  21* 23  GLUCOSE 122*  --   --  124* 100*  BUN 9  --   --  12 11  CREATININE 0.51*  --   --  0.52* 0.57*  CALCIUM 8.6*  --   --  8.8* 9.0  PROT 5.6*  --   --  5.5*  --   ALBUMIN 2.4*  --   --  2.3*  --   AST 15  --   --  16  --   ALT 13  --   --  11  --   ALKPHOS 60  --   --  52  --   BILITOT 0.8  --   --  0.8  --   GFRNONAA >60  --   --  >60 >60  ANIONGAP 7  --   --  7 9   < > = values in this interval not displayed.     Hematology Recent Labs  Lab 09/20/20 1825 09/20/20 1958 09/20/20 2016 09/21/20 0750 09/22/20 0336  WBC 6.4  --   --  5.9 5.1  RBC 4.42  --   --  4.48 4.55  HGB 12.9*   < > 13.6 12.9* 13.0  HCT 41.0   < > 40.0 42.4 41.0  MCV 92.8  --   --  94.6 90.1  MCH 29.2  --   --  28.8 28.6  MCHC 31.5  --   --  30.4 31.7  RDW 14.6  --   --  14.7 14.6  PLT 253  --   --  216 164   < > = values in this interval not displayed.    BNP Recent Labs  Lab 09/20/20 2347  BNP 1,865.8*     DDimer  Recent Labs  Lab 09/20/20 1906  DDIMER 1.68*     Radiology    DG Chest 1 View  Result Date: 09/21/2020 CLINICAL DATA:  80 year old male status post right thoracentesis. EXAM: CHEST  1 VIEW COMPARISON:  Chest x-ray 09/20/2020. FINDINGS: Right-sided pleural effusion has decreased substantially in is now small. Persistent moderate left pleural effusion. No definite pneumothorax. Bibasilar opacities favored to reflect areas of passive subsegmental atelectasis. No evidence of pulmonary edema. Heart size is normal. Mediastinal contours are distorted by patient  positioning. Atherosclerotic calcifications in the thoracic aorta. IMPRESSION: 1. Decreased small right-sided pleural effusion following thoracentesis. No postprocedural pneumothorax. 2. Persistent moderate left pleural effusion. 3. Bibasilar areas of subsegmental atelectasis. 4. Aortic atherosclerosis. Electronically Signed   By: Vinnie Langton M.D.   On: 09/21/2020 08:02   DG Chest 2 View  Result Date: 09/20/2020 CLINICAL DATA:  Sepsis EXAM: CHEST - 2 VIEW COMPARISON:  None. FINDINGS: Shallow lung inflation. Normal cardiomediastinal contours. Right mid lung opacities, likely atelectasis. Mild bilateral interstitial opacity suggesting pulmonary vascular congestion without overt edema. Small right pleural effusion. IMPRESSION: Shallow lung inflation with small right pleural effusion and right mid lung atelectasis. Electronically Signed   By: Ulyses Jarred M.D.   On: 09/20/2020 19:03   CT Angio Chest PE W and/or Wo Contrast  Result Date: 09/20/2020 CLINICAL DATA:  Elevated D-dimer, history of prostate cancer, evaluate for PE. Cerebral palsy. EXAM: CT ANGIOGRAPHY CHEST WITH CONTRAST TECHNIQUE: Multidetector CT imaging of the chest was performed using the standard protocol during bolus administration of intravenous contrast. Multiplanar CT image reconstructions and MIPs were obtained to evaluate the vascular anatomy. CONTRAST:  9mL OMNIPAQUE IOHEXOL 350 MG/ML SOLN COMPARISON:  Chest radiographs dated 09/20/2020. CT chest dated 10/01/2007. FINDINGS: Cardiovascular: Satisfactory opacification of the bilateral pulmonary arteries to the lobar level. Suspected segmental/subsegmental filling defect branches of the right lower lobe pulmonary artery (series 7/images 78 and 82). Overall clot burden is small. No findings of right heart strain. Study is not tailored for evaluation of the thoracic aorta. No evidence of thoracic aortic aneurysm. Atherosclerotic calcifications of the aortic arch. The heart is normal in  size.  No pericardial effusion. Three vessel coronary atherosclerosis. Mediastinum/Nodes: No suspicious mediastinal lymphadenopathy. Visualized thyroid is unremarkable. Lungs/Pleura: Moderate to large right pleural effusion. Small to moderate left pleural effusion. Associated compressive atelectasis in the  right upper, middle, and right lower lobe. Compressive atelectasis in the left lower lobe. No suspicious pulmonary nodules, noting motion degradation. No pneumothorax. Upper Abdomen: Visualized upper abdomen is notable for low-density thickening of the left adrenal gland (series 7/image 142), suggesting a 2.0 cm left adrenal adenoma. Vascular calcifications. Musculoskeletal: Mild degenerative changes of the visualized thoracolumbar spine. No focal osseous lesions. Review of the MIP images confirms the above findings. IMPRESSION: Suspected segmental/subsegmental pulmonary embolus within branches of the right lower lobe pulmonary artery, equivocal/indeterminate (given the limitations of the current study) but favored to reflect a true finding. Overall clot burden is small. No evidence of right heart strain. Critical Value/emergent results were called by telephone at the time of interpretation on 09/20/2020 at 11:20 pm to provider Dr. Dina Rich, who verbally acknowledged these results. Moderate to large bilateral pleural effusions, right greater than left. Associated compressive atelectasis in the lungs bilaterally. Aortic Atherosclerosis (ICD10-I70.0). Electronically Signed   By: Julian Hy M.D.   On: 09/20/2020 23:22   DG Foot Complete Left  Result Date: 09/20/2020 CLINICAL DATA:  Left heel wound for an unknown period of time. EXAM: LEFT FOOT - COMPLETE 3+ VIEW COMPARISON:  None. FINDINGS: Degenerative changes in the interphalangeal joints and first tarsometatarsal joint with evidence of articular bone erosions at the first metatarsal phalangeal and interphalangeal joints. This could represent erosive  arthritis or septic arthritis. No evidence of acute fracture or dislocation. Calcaneus appears intact. No focal erosions to suggest osteomyelitis. Prominent vascular calcifications. IMPRESSION: 1. Degenerative changes. Erosive versus septic arthritic changes in the first metatarsal phalangeal and interphalangeal joints. 2. No evidence of osteomyelitis in the heel. Electronically Signed   By: Lucienne Capers M.D.   On: 09/20/2020 19:32   ECHOCARDIOGRAM COMPLETE  Result Date: 09/21/2020    ECHOCARDIOGRAM REPORT   Patient Name:   Rick Schwartz Date of Exam: 09/21/2020 Medical Rec #:  [id number]      Height:       67.0 in Accession #:    [id number]     Weight:       110.0 lb Date of Birth:  Sep 23, 1940      BSA:          1.569 m Patient Age:    89 years       BP:           126/110 mmHg Patient Gender: M              HR:           77 bpm. Exam Location:  Inpatient Procedure: 2D Echo, Color Doppler and Cardiac Doppler                         REPORT CONTAINS CRITICAL RESULT  Severely reduced LVEF, no known prior history. Findings communicated with family                          medicine service at 11:12 AM. Indications:    Pulmonary Embolus I26.09  History:        Patient has no prior history of Echocardiogram examinations.                 Risk Factors:Hypertension and Dyslipidemia.  Sonographer:    Bernadene Person RDCS Referring Phys: [id number] Towner  1. Left ventricular ejection fraction, by estimation, is <20%. The left ventricle has severely decreased function. The left ventricle demonstrates global  hypokinesis. Left ventricular diastolic parameters are indeterminate.  2. Right ventricular systolic function is moderately reduced. The right ventricular size is normal.  3. Left atrial size was mildly dilated.  4. Right atrial size was mildly dilated.  5. Moderate pleural effusion in both left and right lateral regions.  6. The mitral valve is normal in structure. Mild mitral valve regurgitation. No  evidence of mitral stenosis.  7. The aortic valve is tricuspid. There is moderate calcification of the aortic valve. Aortic valve regurgitation is not visualized. Mild to moderate aortic valve sclerosis/calcification is present, without any evidence of aortic stenosis.  8. The inferior vena cava is normal in size with <50% respiratory variability, suggesting right atrial pressure of 8 mmHg. Comparison(s): No prior Echocardiogram. FINDINGS  Left Ventricle: Left ventricular ejection fraction, by estimation, is <20%. The left ventricle has severely decreased function. The left ventricle demonstrates global hypokinesis. The left ventricular internal cavity size was normal in size. There is no  left ventricular hypertrophy. Left ventricular diastolic parameters are indeterminate. Right Ventricle: The right ventricular size is normal. No increase in right ventricular wall thickness. Right ventricular systolic function is moderately reduced. Left Atrium: Left atrial size was mildly dilated. Right Atrium: Right atrial size was mildly dilated. Pericardium: Trivial pericardial effusion is present. Mitral Valve: The mitral valve is normal in structure. Mild mitral valve regurgitation. No evidence of mitral valve stenosis. Tricuspid Valve: The tricuspid valve is grossly normal. Tricuspid valve regurgitation is trivial. Aortic Valve: The aortic valve is tricuspid. There is moderate calcification of the aortic valve. Aortic valve regurgitation is not visualized. Mild to moderate aortic valve sclerosis/calcification is present, without any evidence of aortic stenosis. Pulmonic Valve: The pulmonic valve was grossly normal. Pulmonic valve regurgitation is mild. No evidence of pulmonic stenosis. Aorta: The aortic root, ascending aorta and aortic arch are all structurally normal, with no evidence of dilitation or obstruction. Venous: The inferior vena cava is normal in size with less than 50% respiratory variability, suggesting right  atrial pressure of 8 mmHg. IAS/Shunts: The atrial septum is grossly normal. Additional Comments: There is a moderate pleural effusion in both left and right lateral regions.  LEFT VENTRICLE PLAX 2D LVIDd:         4.70 cm      Diastology LVIDs:         4.30 cm      LV e' medial:    6.05 cm/s LV PW:         0.90 cm      LV E/e' medial:  9.9 LV IVS:        0.80 cm      LV e' lateral:   10.50 cm/s LVOT diam:     1.90 cm      LV E/e' lateral: 5.7 LV SV:         23 LV SV Index:   15 LVOT Area:     2.84 cm  LV Volumes (MOD) LV vol d, MOD A2C: 85.8 ml LV vol d, MOD A4C: 117.0 ml LV vol s, MOD A2C: 61.5 ml LV vol s, MOD A4C: 75.8 ml LV SV MOD A2C:     24.3 ml LV SV MOD A4C:     117.0 ml LV SV MOD BP:      37.1 ml RIGHT VENTRICLE RV S prime:     7.56 cm/s TAPSE (M-mode): 0.8 cm LEFT ATRIUM           Index  RIGHT ATRIUM           Index LA diam:      4.40 cm 2.81 cm/m  RA Area:     13.60 cm LA Vol (A2C): 37.9 ml 24.16 ml/m RA Volume:   33.30 ml  21.23 ml/m LA Vol (A4C): 37.1 ml 23.65 ml/m  AORTIC VALVE LVOT Vmax:   55.03 cm/s LVOT Vmean:  32.867 cm/s LVOT VTI:    0.080 m  AORTA Ao Root diam: 2.60 cm Ao Asc diam:  2.60 cm MITRAL VALVE MV Area (PHT): 2.91 cm    SHUNTS MV Decel Time: 261 msec    Systemic VTI:  0.08 m MV E velocity: 59.60 cm/s  Systemic Diam: 1.90 cm MV A velocity: 35.30 cm/s MV E/A ratio:  1.69 Buford Dresser MD Electronically signed by Buford Dresser MD Signature Date/Time: 09/21/2020/11:13:59 AM    Final     Cardiac Studies     Patient Profile     80 y.o. male   Assessment & Plan     Chronic combined CHF: Difficult to assess any symptoms.   He has severe CP and does not complain much . Will cont Rx with Losartan. Would initiate Toprol XL 12.5 mg QD when / if his BP tolerates.  He is not a candidate for invasive cardiac procedures.   He will not need cardiology follow up but should follow up with his primary MD He is followed by palliative care.   I would support  hospice care approach as his condition worsens.         For questions or updates, please contact [place] Please consult [email or web address] for contact info under        Signed, Mertie Moores, MD  09/22/2020, 9:12 AM

## 2020-09-22 NOTE — Evaluation (Signed)
Occupational Therapy Evaluation Patient Details Name: Rick Schwartz MRN: [id number] DOB: 1940/11/10 Today's Date: 09/22/2020    History of Present Illness 80 y.o. male [facility] ED on 09/20/2020 SOB, tachycardia and tachpnea. Pt found to have PE and bilateral pleural effusions > + for  staphylococcus . PMHx is significant for cerebral palsy, HTN, foot ulcer, severe protein calorie malnutrition, DVT, prostate cancer, PAD, and HLD.   Clinical Impression   Pt admitted with above. He demonstrates the below listed deficits and will benefit from continued OT to maximize safety and independence with BADLs.  Pt currently requires total A for all aspects of ADLs and functional mobility at this time.  He lives with his sister, who provides assist with ADLs and functional mobility.  Sister reports that pt has been able to feed self using AE with assistance and has been working with [facility] on this, and working with HHPT to improve his ability to assist with transfers.  Will follow acutely.       Follow Up Recommendations  Home health OT;Supervision/Assistance - 24 hour    Equipment Recommendations  None recommended by OT (sister reports a hoyer will not fit into their space)    Recommendations for Other Services       Precautions / Restrictions Precautions Precautions: Fall      Mobility Bed Mobility               General bed mobility comments: pt deferred    Transfers                 General transfer comment: pt deferred    Balance                                           ADL either performed or assessed with clinical judgement   ADL                                         General ADL Comments: Pt requires total assist with all ADLs at this time as he is positionally dependent for self feeding and dependent on use of special AE to be able to feed self which he does not have available in hospital     Vision         Perception      Praxis      Pertinent Vitals/Pain Pain Assessment: Faces Faces Pain Scale: Hurts a little bit Pain Location: L heel Pain Descriptors / Indicators: Grimacing Pain Intervention(s): Monitored during session;Limited activity within patient's tolerance     Hand Dominance Left   Extremity/Trunk Assessment Upper Extremity Assessment RUE Deficits / Details: Pt with fixed contractures of elbow, wrist and digits.  Shoulder flexion actively to ~80-90* LUE Deficits / Details: shoulder strength grossly 2-/5; elbow distally grossly 3/5; hand with gross grasp/release   Lower Extremity Assessment Lower Extremity Assessment: Defer to PT evaluation   Cervical / Trunk Assessment Cervical / Trunk Assessment: Kyphotic   Communication Communication Communication: Expressive difficulties   Cognition Arousal/Alertness: Awake/alert Behavior During Therapy: WFL for tasks assessed/performed Overall Cognitive Status: No family/caregiver present to determine baseline cognitive functioning  General Comments: per sister, pt is at, or close to baseline.  He was able to follow commands consistently and able to answer questions appropriately   General Comments  sister present    Exercises     Shoulder Instructions      Home Living Family/patient expects to be discharged to:: Private residence Living Arrangements: Other relatives (sister and brother-in-law) Available Help at Discharge: Family;Available 24 hours/day Type of Home: House Home Access: Level entry     Home Layout: One level     Bathroom Shower/Tub: Tub/shower unit         Home Equipment: Wheelchair - manual;Hospital bed;Tub bench;Bedside commode   Additional Comments: Pt lives with sister and brother and law who have been providing 24 hour assist for the past several months.      Prior Functioning/Environment Level of Independence: Needs assistance  Gait / Transfers Assistance  Needed: Per sister, pt requires +2 assist to transfer to recliner or wheelchair, which he does almost daily.  He was receiving HHPT.  He was ambulatory prior to AKA ADL's / Homemaking Assistance Needed: Pt requires assist for bathing, dressing, toileting, but was working with OT on self feeding with adaptive utensils            OT Problem List: Decreased strength;Decreased range of motion;Decreased activity tolerance;Impaired balance (sitting and/or standing);Decreased coordination      OT Treatment/Interventions: Self-care/ADL training;Neuromuscular education;DME and/or AE instruction;Therapeutic activities;Patient/family education;Balance training    OT Goals(Current goals can be found in the care plan section) Acute Rehab OT Goals Patient Stated Goal: sister hopes pt will return home with her OT Goal Formulation: With patient/family Time For Goal Achievement: 10/06/20 Potential to Achieve Goals: Good ADL Goals Pt Will Transfer to Toilet: with +2 assist;squat pivot transfer;with transfer board;bedside commode;with mod assist Additional ADL Goal #1: Pt will maintain EOB sitting with mod A for 7 mins in prep for ADLs  OT Frequency: Min 2X/week   Barriers to D/C:            Co-evaluation              AM-PAC OT "6 Clicks" Daily Activity     Outcome Measure Help from another person eating meals?: Total Help from another person taking care of personal grooming?: Total Help from another person toileting, which includes using toliet, bedpan, or urinal?: Total Help from another person bathing (including washing, rinsing, drying)?: Total Help from another person to put on and taking off regular upper body clothing?: Total Help from another person to put on and taking off regular lower body clothing?: Total 6 Click Score: 6   End of Session Nurse Communication: Mobility status  Activity Tolerance: Patient limited by fatigue Patient left: in bed;with call bell/phone within  reach;with family/visitor present  OT Visit Diagnosis: Unsteadiness on feet (R26.81);Muscle weakness (generalized) (M62.81);Feeding difficulties (R63.3)                Time: 4818-5631 OT Time Calculation (min): 15 min Charges:  OT General Charges $OT Visit: 1 Visit OT Evaluation $OT Eval Moderate Complexity: 1 Mod  Nilsa Nutting., OTR/L Acute Rehabilitation Services Pager [phone] Office [phone]   Lucille Passy M 09/22/2020, 5:08 PM

## 2020-09-22 NOTE — Progress Notes (Signed)
ANTICOAGULATION CONSULT NOTE - Follow Up Consult  Pharmacy Consult for heparin Indication: pulmonary embolus  No Known Allergies  Patient Measurements: Height: 5\' 7"  (170.2 cm) Weight: 45 kg (99 lb 3.3 oz) IBW/kg (Calculated) : 66.1 Heparin Dosing Weight: 49.9kg  Vital Signs: Temp: 97.5 F (36.4 C) (07/07 0308) Temp Source: Oral (07/07 0308) BP: 101/60 (07/07 0308) Pulse Rate: 91 (07/07 0308)  Labs: Recent Labs    09/20/20 1825 09/20/20 1906 09/20/20 1958 09/20/20 2016 09/20/20 2303 09/21/20 0351 09/21/20 0750 09/21/20 1445 09/22/20 0336  HGB 12.9*  --    < > 13.6  --   --  12.9*  --  13.0  HCT 41.0  --    < > 40.0  --   --  42.4  --  41.0  PLT 253  --   --   --   --   --  216  --  164  APTT  --   --   --   --   --   --  33 36 33  LABPROT  --  14.9  --   --   --   --  15.1  --   --   INR  --  1.2  --   --   --   --  1.2  --   --   HEPARINUNFRC  --   --   --   --   --   --  0.32 0.28* 0.27*  CREATININE 0.51*  --   --   --   --   --  0.52*  --  0.57*  TROPONINIHS  --   --   --   --  22* 24*  --   --   --    < > = values in this interval not displayed.     Estimated Creatinine Clearance: 47.7 mL/min (A) (by C-G formula based on SCr of 0.57 mg/dL (L)).   Medications:  Infusions:   heparin 950 Units/hr (09/22/20 0428)    Assessment: 80 yo M admitted with SOB and found to have PE and bilateral pleural effusions.  Pt was on Eliquis 2.5mg  BID PTA for hx DVT (last dose 7/5 at 0800).  Pt was initiated on heparin infusion 7/5 PM.    Heparin level subtherapeutic (0.27) - question if still being affected by Eliquis. PTT remains subtherapeutic (33 sec) - basically unchanged since heparin start. PTT and heparin level do not seem to be correlating. No issues with line or bleeding reported per RN.  Goal of Therapy:  Heparin level 0.3-0.7 units/ml aPTT 66-102 seconds Monitor platelets by anticoagulation protocol: Yes   Plan:  Give heparin 2000 units IV x 1 and increase  gtt to 1100 units/hr F/u 8 hour heparin level and PTT  Sherlon Handing, PharmD, BCPS Please see amion for complete clinical pharmacist phone list 09/22/2020 5:10 AM

## 2020-09-22 NOTE — Progress Notes (Signed)
Family Medicine Teaching Service Daily Progress Note Intern Pager: [phone]  Patient name: Rick Schwartz Medical record number: [id number] Date of birth: [birth year] Age: 80 y.o. Gender: male  Primary Care Provider: Lurline Del, DO Consultants: Pulm, Cards, Wound Code Status: DNR  Pt Overview and Major Events to Date:  7/6- Admission 7/6- Thoracentesis  Assessment and Plan: Patient is a 80 yo male presenting with SOB.  Found  to have pleural effusion in the setting of newly diagnosed severe HFrEF  Pulmonary Embolism Heparin dosing per Pharmacy.  Plan to switch to DOAC today.  Discussed with Pharmacy and onboard. - Start on Eliquis  Pleural Effusion Breathing well.  Continue to wean O2 requirment.   - Cards following - Continue Lasix 20 mg PO - Wean O2 to room air - Continuous pulse Ox monitoring  New Onset Severe HFrEF On Lasix 20 mg.  Not candidate for Cath. - Cardiology following, appreciate recs - Continue Lasix - Strict I's/O's  Left Heel Pain X-Ray showing no signs of osteomyelitis.  ESR, CRP mildly elevated, likely due to other medical conditions.  Will consider MRI if family wanting to evaluate. - follow up ABI - Continue wound care - Schedule Tylenol for pain  Dysphagia Had issue with eating larger piece of sausage today.  Plan to start soft diet and have speech evaluate. - SLP eval and treat  [facility] consult placed.   - Palliative discussion with family  FEN/GI: Heart healthy diet PPx: Heparin drip Dispo:Home in 2-3 days. Barriers include Continued medical work-up.   Subjective:  Patient indicates feeling well this morning.  No complaints outside of heel pain.  Objective: Temp:  [97.5 F (36.4 C)-98.4 F (36.9 C)] 97.5 F (36.4 C) (07/07 0308) Pulse Rate:  [83-101] 91 (07/07 0308) Resp:  [14-36] 17 (07/07 0308) BP: (98-126)/(60-99) 101/60 (07/07 0308) SpO2:  [75 %-100 %] 100 % (07/07 0308) Weight:  [45 kg] 45 kg (07/07  0308) Physical Exam:  Physical Exam HENT:     Head: Normocephalic and atraumatic.  Cardiovascular:     Rate and Rhythm: Normal rate and regular rhythm.  Pulmonary:     Effort: Pulmonary effort is normal.     Breath sounds: Normal breath sounds.  Musculoskeletal:     Left foot: Tenderness present.     Comments: Tenderness of heel  Neurological:     Mental Status: He is alert.     Laboratory: Recent Labs  Lab 09/20/20 1825 09/20/20 1958 09/20/20 2016 09/21/20 0750 09/22/20 0336  WBC 6.4  --   --  5.9 5.1  HGB 12.9*   < > 13.6 12.9* 13.0  HCT 41.0   < > 40.0 42.4 41.0  PLT 253  --   --  216 164   < > = values in this interval not displayed.   Recent Labs  Lab 09/20/20 1825 09/20/20 1958 09/20/20 2016 09/21/20 0750 09/22/20 0336  NA 140   < > 143 140 143  K 3.6   < > 3.6 3.7 3.6  CL 110  --   --  112* 111  CO2 23  --   --  21* 23  BUN 9  --   --  12 11  CREATININE 0.51*  --   --  0.52* 0.57*  CALCIUM 8.6*  --   --  8.8* 9.0  PROT 5.6*  --   --  5.5*  --   BILITOT 0.8  --   --  0.8  --  ALKPHOS 60  --   --  52  --   ALT 13  --   --  11  --   AST 15  --   --  16  --   GLUCOSE 122*  --   --  124* 100*   < > = values in this interval not displayed.     Imaging/Diagnostic Tests: EXAM: CHEST  1 VIEW   COMPARISON:  Chest x-ray 09/20/2020.   FINDINGS: Right-sided pleural effusion has decreased substantially in is now small. Persistent moderate left pleural effusion. No definite pneumothorax. Bibasilar opacities favored to reflect areas of passive subsegmental atelectasis. No evidence of pulmonary edema. Heart size is normal. Mediastinal contours are distorted by patient positioning. Atherosclerotic calcifications in the thoracic aorta.   IMPRESSION: 1. Decreased small right-sided pleural effusion following thoracentesis. No postprocedural pneumothorax. 2. Persistent moderate left pleural effusion. 3. Bibasilar areas of subsegmental atelectasis. 4.  Aortic atherosclerosis.  Delora Fuel, MD 09/22/2020, 7:34 AM PGY-1, [place] Intern pager: [phone], text pages welcome

## 2020-09-23 ENCOUNTER — Encounter (HOSPITAL_COMMUNITY): Payer: Self-pay | Admitting: Family Medicine

## 2020-09-23 DIAGNOSIS — R4189 Other symptoms and signs involving cognitive functions and awareness: Secondary | ICD-10-CM

## 2020-09-23 DIAGNOSIS — Z7189 Other specified counseling: Secondary | ICD-10-CM

## 2020-09-23 DIAGNOSIS — Z515 Encounter for palliative care: Secondary | ICD-10-CM

## 2020-09-23 DIAGNOSIS — F0390 Unspecified dementia without behavioral disturbance: Secondary | ICD-10-CM

## 2020-09-23 LAB — BASIC METABOLIC PANEL
Anion gap: 6 (ref 5–15)
BUN: 11 mg/dL (ref 8–23)
CO2: 24 mmol/L (ref 22–32)
Calcium: 8.9 mg/dL (ref 8.9–10.3)
Chloride: 108 mmol/L (ref 98–111)
Creatinine, Ser: 0.54 mg/dL — ABNORMAL LOW (ref 0.61–1.24)
GFR, Estimated: >60 mL/min (ref >60)
Glucose, Bld: 86 mg/dL (ref 70–99)
Potassium: 3.6 mmol/L (ref 3.5–5.1)
Sodium: 138 mmol/L (ref 135–145)

## 2020-09-23 LAB — CBC
HCT: 40.1 % (ref 39.0–52.0)
Hemoglobin: 12.4 g/dL — ABNORMAL LOW (ref 13.0–17.0)
MCH: 28.4 pg (ref 26.0–34.0)
MCHC: 30.9 g/dL (ref 30.0–36.0)
MCV: 92 fL (ref 80.0–100.0)
Platelets: 207 10*3/uL (ref 150–400)
RBC: 4.36 MIL/uL (ref 4.22–5.81)
RDW: 14.5 % (ref 11.5–15.5)
WBC: 3.8 10*3/uL — ABNORMAL LOW (ref 4.0–10.5)
nRBC: 0 % (ref 0.0–0.2)

## 2020-09-23 LAB — MAGNESIUM: Magnesium: 1.7 mg/dL (ref 1.7–2.4)

## 2020-09-23 MED ORDER — MAGNESIUM SULFATE 2 GM/50ML IV SOLN
2.0000 g | Freq: Once | INTRAVENOUS | Status: AC
  Administered 2020-09-23: 2 g via INTRAVENOUS
  Filled 2020-09-23: qty 50

## 2020-09-23 MED ORDER — POTASSIUM CHLORIDE 20 MEQ PO PACK
40.0000 meq | PACK | Freq: Once | ORAL | Status: AC
  Administered 2020-09-23: 40 meq via ORAL
  Filled 2020-09-23: qty 2

## 2020-09-23 MED ORDER — POTASSIUM CHLORIDE CRYS ER 20 MEQ PO TBCR: 40.0000 meq | EXTENDED_RELEASE_TABLET | Freq: Once | ORAL | Status: AC

## 2020-09-23 NOTE — Evaluation (Signed)
Clinical/Bedside Swallow Evaluation Patient Details  Name: Rick Schwartz MRN: [id number] Date of Birth: [birth year]  Today's Date: 09/23/2020 Time: SLP Start Time (ACUTE ONLY): 9935 SLP Stop Time (ACUTE ONLY): 0824 SLP Time Calculation (min) (ACUTE ONLY): 12 min  Past Medical History:  Past Medical History:  Diagnosis Date   Cancer ([place])    Prostate   Cerebral palsy ([place])    Hyperlipidemia    Hypertension    Peripheral arterial disease ([place])    critical limb ischemia   Past Surgical History:  Past Surgical History:  Procedure Laterality Date   AMPUTATION Right 07/22/2020   Procedure: RIGHT ABOVE KNEE AMPUTATION;  Surgeon: [name], MD;  Location: [facility];  Service: Orthopedics;  Laterality: Right;   PROSTATE SURGERY     HPI:  80 y.o. male presents to [facility] ED on 09/20/2020 SOB, tachycardia and tachpnea. Pt found to have PE and bilateral pleural effusions > + for  staphylococcus. Per dietitian note 7/7, family was pureeing his food PTA due to respiratory status, but otherwise he is normally on regular solids. Previous BSE in May 2022 recommended Dys 2 solids and thin liquids. PMHx is significant for cerebral palsy, HTN, foot ulcer, severe protein calorie malnutrition, DVT, prostate cancer, PAD, and HLD.   Assessment / Plan / Recommendation Clinical Impression  Pt has limited lingual ROM, able to protrude his tongue well with no asymmetry, but he does not lateralize it despite cues. NT who helped him with meals yesterday and this morning says that she has noticed difficulty with chewing and coughing if the foods are not cut up and mashed so that they are softer to chew. Pt denies any trouble with this PTA, but even with soft bites of food given by SLP he has quite prolonged oral preparation with suspected delays in swallow initiation. No overt s/s of aspiration are noted though. Recommend Dys 2 (finely chopped) diet and thin liquids with emphasis also on adequate pt positioning for meals.  Will f/u acutely for potential to advance to more of his baseline diet textures. SLP Visit Diagnosis: Dysphagia, oral phase (R13.11)    Aspiration Risk  Mild aspiration risk    Diet Recommendation Dysphagia 2 (Fine chop);Thin liquid   Liquid Administration via: Straw Medication Administration: Crushed with puree Supervision: Staff to assist with self feeding Compensations: Slow rate;Small sips/bites Postural Changes: Seated upright at 90 degrees;Remain upright for at least 30 minutes after po intake    Other  Recommendations Oral Care Recommendations: Oral care BID   Follow up Recommendations Home health SLP      Frequency and Duration min 2x/week  2 weeks       Prognosis Prognosis for Safe Diet Advancement: Good      Swallow Study   General HPI: 80 y.o. male presents to [facility] ED on 09/20/2020 SOB, tachycardia and tachpnea. Pt found to have PE and bilateral pleural effusions > + for  staphylococcus. Per dietitian note 7/7, family was pureeing his food PTA due to respiratory status, but otherwise he is normally on regular solids. Previous BSE in May 2022 recommended Dys 2 solids and thin liquids. PMHx is significant for cerebral palsy, HTN, foot ulcer, severe protein calorie malnutrition, DVT, prostate cancer, PAD, and HLD. Type of Study: Bedside Swallow Evaluation Previous Swallow Assessment: see HPI Diet Prior to this Study: Regular;Thin liquids Temperature Spikes Noted: No Respiratory Status: Room air History of Recent Intubation: No Behavior/Cognition: Alert;Cooperative Oral Cavity Assessment: Within Functional Limits Oral Care Completed by SLP:  No Oral Cavity - Dentition: Edentulous Self-Feeding Abilities: Total assist Patient Positioning: Upright in bed Baseline Vocal Quality: Normal Volitional Swallow: Able to elicit    Oral/Motor/Sensory Function Overall Oral Motor/Sensory Function: Moderate impairment Lingual ROM: Reduced right;Reduced left Lingual Symmetry: Within  Functional Limits   Ice Chips Ice chips: Not tested   Thin Liquid Thin Liquid: Within functional limits Presentation: Straw    Nectar Thick Nectar Thick Liquid: Not tested   Honey Thick Honey Thick Liquid: Not tested   Puree Puree: Not tested   Solid     Solid: Impaired Presentation: Spoon Oral Phase Functional Implications: Impaired mastication;Prolonged oral transit Pharyngeal Phase Impairments: Suspected delayed Swallow      Osie Bond., M.A. [place] Pager [phone] Office [phone]  09/23/2020,9:18 AM

## 2020-09-23 NOTE — Plan of Care (Signed)

## 2020-09-23 NOTE — Progress Notes (Addendum)
Family Medicine Teaching Service Daily Progress Note Intern Pager: [phone]  Patient name: Rick Schwartz Medical record number: [id number] Date of birth: February 15, 1941 Age: 80 y.o. Gender: male  Primary Care Provider: Lurline Del, DO Consultants: Pulm, Cards, Palliative Code Status: DNR  Pt Overview and Major Events to Date:  7/6- Admission 7/6- Thoracentesis  Assessment and Plan: Patient is a 80 yo male with new onset severe HFrEF, PE, and PAD.  Want to have goals of care discussion with patient and family regarding GoC.  Pulmonary Embolism  Pleural Effusion Satting >95% on RA.  Pleural Effusion.  Switched to Eliquis 10 mg BID.  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   - Continue Eliquis per Pharmacy dosing - follow up CBC  New Onset Severe HFrEF BP soft at 101/68.  Will not start Toprol 12.5 yet.  Cards reccomending Hospice Care.  - Cards following, appreciate recs - Continue Losartan - Replete Potassium and Magnesium  Dysphagia Speech recommended dysphagia 2 diet. - Dysphagia 2 diet  Left Heel Pain Continues to have pain.  Tylenol helping some.  ABI indicative of poor perfusion. - Will consider MRI following GoC discussion  FEN/GI: Soft Diet PPx: Eliquis Dispo:Home in 2-3 days. Barriers include Medical Management, GoC discussion.   Subjective:  Patient denies any problems outside of current foot pain.    Objective: Temp:  [97.4 F (36.3 C)-98.7 F (37.1 C)] 97.4 F (36.3 C) (07/08 0740) Pulse Rate:  [88-98] 92 (07/08 0740) Resp:  [17-19] 18 (07/08 0740) BP: (92-104)/(57-70) 101/68 (07/08 0740) SpO2:  [98 %-100 %] 99 % (07/08 0740) Weight:  [47 kg] 47 kg (07/08 0411) Physical Exam:  Physical Exam Constitutional:      General: He is not in acute distress. Cardiovascular:     Rate and Rhythm: Normal rate and regular rhythm.  Pulmonary:     Effort: Pulmonary effort is normal.     Breath sounds: Normal breath sounds. No decreased breath  sounds or wheezing.     Comments: Breathing well on RA Musculoskeletal:     Left lower leg: No edema.     Comments: Right AKA, Left heel tenderness  Skin:    General: Skin is warm.     Capillary Refill: Capillary refill takes less than 2 seconds.  Neurological:     Mental Status: He is alert and oriented to person, place, and time.     Laboratory: Recent Labs  Lab 09/20/20 1825 09/20/20 1958 09/20/20 2016 09/21/20 0750 09/22/20 0336  WBC 6.4  --   --  5.9 5.1  HGB 12.9*   < > 13.6 12.9* 13.0  HCT 41.0   < > 40.0 42.4 41.0  PLT 253  --   --  216 164   < > = values in this interval not displayed.   Recent Labs  Lab 09/20/20 1825 09/20/20 1958 09/21/20 0750 09/22/20 0336 09/23/20 0525  NA 140   < > 140 143 138  K 3.6   < > 3.7 3.6 3.6  CL 110  --  112* 111 108  CO2 23  --  21* 23 24  BUN 9  --  12 11 11   CREATININE 0.51*  --  0.52* 0.57* 0.54*  CALCIUM 8.6*  --  8.8* 9.0 8.9  PROT 5.6*  --  5.5*  --   --   BILITOT 0.8  --  0.8  --   --   ALKPHOS 60  --  52  --   --  ALT 13  --  11  --   --   AST 15  --  16  --   --   GLUCOSE 122*  --  124* 100* 86   < > = values in this interval not displayed.    Imaging/Diagnostic Tests: No new imaging  Delora Fuel, MD 09/23/2020, 8:44 AM PGY-1, [place] Intern pager: [phone], text pages welcome

## 2020-09-23 NOTE — Consult Note (Signed)
Palliative Care Consult Note                                  Date: 09/23/2020   Patient Name: Rick Schwartz  DOB: 04-14-1940  MRN: [id number]  Age / Sex: 80 y.o., male  PCP: Lurline Del, DO Referring Physician: Zenia Resides, MD  Reason for Consultation: Establishing goals of care  HPI/Patient Profile: 80 y.o. male  with past medical history of cerebral palsy, HTN, foot ulcer, severe protein calorie malnutrition, DVT, prostate cancer, PAD, and HLD admitted on 09/20/2020 with shortness of breath, tachycardia, and tachypnea. Found to have a PE, bilateral pleural effusions, and newly diagnosed CHF (EF <20%)   Past Medical History:  Diagnosis Date   Cancer ([place])    Prostate   Cerebral palsy ([place])    Hyperlipidemia    Hypertension    Peripheral arterial disease ([place])    critical limb ischemia    Social History   Socioeconomic History   Marital status: Single    Spouse name: Not on file   Number of children: Not on file   Years of education: Not on file   Highest education level: Not on file  Occupational History   Not on file  Tobacco Use   Smoking status: Former    Packs/day: 0.25    Years: 35.00    Pack years: 8.75    Types: Cigarettes    Quit date: 10/12/1994    Years since quitting: 25.9   Smokeless tobacco: Never  Vaping Use   Vaping Use: Never used  Substance and Sexual Activity   Alcohol use: Yes    Comment: Occasional beer and gin   Drug use: No   Sexual activity: Never  Other Topics Concern   Not on file  Social History Narrative   Not on file   Social Determinants of Health   Financial Resource Strain: Not on file  Food Insecurity: Not on file  Transportation Needs: Not on file  Physical Activity: Not on file  Stress: Not on file  Social Connections: Not on file    Family History  Problem Relation Age of Onset   Diabetes Mother    Hypertension Mother    Hypertension Father    Diabetes  Sister    Hypertension Sister    Diabetes Brother    Hypertension Brother    Diabetes Sister    Thyroid disease Sister    Sudden death Brother     Subjective:   This NP Walden Field reviewed medical records, received report from team, assessed the patient and then meet at the patient's bedside  to discuss diagnosis, prognosis, GOC, EOL wishes disposition and options.   Concept of Palliative Care was introduced as specialized medical care for people and their families living with serious illness.  If focuses on providing relief from the symptoms and stress of a serious illness.  The goal is to improve quality of life for both the patient and the family. Values and goals of care important to patient and family were attempted to be elicited.  Created space and opportunity for patient  and family to explore thoughts and feelings regarding current medical situation   Natural trajectory and expectations at EOL were discussed. Questions and concerns addressed. Patient  encouraged to call with questions or concerns.    Patient Values: Family, enjoys sports including baseball and racing.  Also enjoys watching TV.  Patient/Family Understanding of Illness: The patient was visited with no family at bedside.  I called his Sister Lynnell Chad who is also his healthcare power of attorney.  They are understanding is that he has a small blood clot in his lung.  Denied any awareness of heart failure.  Discussed his current heart failure diagnosis including EF of less than 20% which is significantly weak.  They note his past medical history includes being born with cerebral palsy.  He was quite active until he began having problems with his legs about 4 months ago.  Since then he has been living with Mardene Celeste and is totally dependent on her and her husband.  After discussing his current symptoms his sister states that she has noticed that he has looked weak in his eyes and could tell that he is getting weaker.   She indicates that when it is this time, if it is God's will, it will be his time.  Review of Systems  Constitutional:  Positive for appetite change and fatigue.  Respiratory:  Negative for cough, chest tightness and shortness of breath.   Cardiovascular:  Negative for chest pain.  Gastrointestinal:  Negative for abdominal pain, nausea and vomiting.   Objective:   Primary Diagnoses: Present on Admission:  Right pulmonary embolus (HCC)  Pulmonary embolus (HCC)  Protein-calorie malnutrition (HCC)   Scheduled Meds:  acetaminophen  650 mg Oral Q6H   Or   acetaminophen  650 mg Rectal Q6H   apixaban  10 mg Oral BID   Followed by   Derrill Memo ON 09/29/2020] apixaban  5 mg Oral BID   feeding supplement  237 mL Oral BID BM   furosemide  20 mg Oral Daily   Gerhardt's butt cream  1 application Topical TID   losartan  25 mg Oral Daily   multivitamin with minerals  1 tablet Oral Daily   potassium chloride  40 mEq Oral Once   Or   potassium chloride  40 mEq Oral Once   rosuvastatin  20 mg Oral Daily    Continuous Infusions:   PRN Meds:   No Known Allergies  Physical Exam Vitals and nursing note reviewed.  Constitutional:      General: He is not in acute distress.    Appearance: He is ill-appearing. He is not toxic-appearing.  HENT:     Head: Normocephalic and atraumatic.  Cardiovascular:     Rate and Rhythm: Normal rate and regular rhythm.  Pulmonary:     Effort: Pulmonary effort is normal. No tachypnea, accessory muscle usage or respiratory distress.     Breath sounds: Normal breath sounds.  Musculoskeletal:     Right hand: Deformity present.     Left hand: Deformity present.  Skin:    General: Skin is warm and dry.  Neurological:     General: No focal deficit present.     Mental Status: He is alert.  Psychiatric:        Mood and Affect: Mood normal.        Behavior: Behavior normal.    Vital Signs:  BP 101/68 (BP Location: Left Arm)   Pulse 92   Temp (!) 97.4 F  (36.3 C) (Oral)   Resp 18   Ht 5\' 7"  (1.702 m)   Wt 47 kg   SpO2 99%   BMI 16.23 kg/m  Pain Scale: 0-10   Pain Score: 0-No pain  SpO2: SpO2: 99 % O2 Device:SpO2: 99 % O2 Flow Rate: .O2 Flow Rate (L/min): 2  L/min  IO: Intake/output summary:  Intake/Output Summary (Last 24 hours) at 09/23/2020 1023 Last data filed at 09/23/2020 0816 Gross per 24 hour  Intake 949 ml  Output 300 ml  Net 649 ml    LBM: Last BM Date: 09/22/20 Baseline Weight: Weight: 49.9 kg Most recent weight: Weight: 47 kg      Palliative Assessment/Data: 30%   Advanced Care Planning:   Primary Decision Maker: PATIENT  Code Status/Advance Care Planning: DNR  A discussion was had today regarding advanced directives. Concepts specific to code status, artifical feeding and hydration, continued IV antibiotics and rehospitalization was had.  The difference between a aggressive medical intervention path and a palliative comfort care path for this patient at this time was had. The elements of the MOST form were introduced and discussed.His sister advised that he would need to make decisions on tube feeds and other elements.  I encouraged him to have these discussions ongoing and we would continue to as well.  Decisions/Changes to ACP: DNR as noted Further discussions and decisions to be forthcoming  Assessment & Plan:   I have reviewed the medical record, interviewed the patient and family, and examined the patient. The following aspects are pertinent.  Impression: 80 year old male with previous multiple medical conditions now with pulmonary embolism requiring anticoagulation as well as new onset CHF with an EF less than 20%/severe.  Currently he seems to be doing okay, no dyspnea or chest pain.  We discussed the risks including cardiac dysrhythmia, pulmonary edema, fluid retention, etc.  Encouraged him to have discussions about wishes moving forward such as tube feeds, ICU admissions, etc.  SUMMARY OF  RECOMMENDATIONS   Continue discussions related to goals of care Continue to reinforce risks of EF less than 20% Treat the treatable DNR as noted Continue PMT support  Symptom Management:  No acute palliative for symptom management needs at this time We are available as needed  Palliative Prophylaxis:  Frequent Pain Assessment, assessment for dyspnea  Additional Recommendations (Limitations, Scope, Preferences): Full Scope Treatment  Psycho-social/Spiritual:  Additional Recommendations: Caregiving  Support/Resources  Prognosis:  Unable to determine  Discharge Planning:  To Be Determined   Discussed with: Patient, sister, Dr. Andria Frames    Thank you for allowing [place] to participate in the care of Sueanne Margarita PMT will continue to support holistically.  Time In: 2:00 Time Out: 3:30 Time Total: 90 min  Greater than 50%  of this time was spent counseling and coordinating care related to the above assessment and plan.  Signed by: Walden Field, NP Palliative Medicine Team  Team Phone # [phone] (Nights/Weekends)  09/23/2020, 10:23 AM

## 2020-09-23 NOTE — Discharge Instructions (Addendum)
Dear Rick Schwartz,  Thank you for letting us participate in your care. You were hospitalized for shortness of breath and chest pain. You were found to have blood clots in your lungs and also diagnosed with severe heart failure.   POST-HOSPITAL & CARE INSTRUCTIONS We have set you up for home physical therapy and occupational therapy. Please let your primary care doctor know if you haven't heard from therapy to set it up.  Go to your follow up appointments with your primary care clinic. Details below.    DOCTOR'S APPOINTMENT   Future Appointments  Date Time Provider [place]  10/03/2020  3:50 PM Autry-Lott, Naaman Plummer, DO [facility] [place]    Follow-up Information     Autry-Lott, Naaman Plummer, DO. Go on 10/03/2020.   Specialty: Family Medicine Why: @3 :50pm. Please arrive by 3:30pm. This is your hospital follow up appointment with your primary care clinic. Contact information: [address] [address] [place]. Call.   Specialty: Family Medicine Why: If you believe you need to be seen in the clinic sooner than Monday, 7/18, please call and schedule a same-day or next-day appointment. Contact information: [address] [id number] [place] [phone]                Take care and be well!  [facility]  [place], [place] [address] [phone]      Information on my medicine - ELIQUIS (apixaban)  This medication education was reviewed with me or my healthcare representative as part of my discharge preparation.    Why was Eliquis prescribed for you? Eliquis was prescribed to treat blood clots that may have been found in the veins of your legs (deep vein thrombosis) or in your lungs (pulmonary embolism) and to reduce the risk of them occurring again.  What do You need to know  about Eliquis ? The starting dose is 10 mg (two 5 mg tablets) taken TWICE daily for the FIRST SEVEN (7) DAYS, then on (enter date)  09/29/20  the dose is reduced to ONE 5 mg tablet taken TWICE daily.  Eliquis may be taken with or without food.   Try to take the dose about the same time in the morning and in the evening. If you have difficulty swallowing the tablet whole please discuss with your pharmacist how to take the medication safely.  Take Eliquis exactly as prescribed and DO NOT stop taking Eliquis without talking to the doctor who prescribed the medication.  Stopping may increase your risk of developing a new blood clot.  Refill your prescription before you run out.  After discharge, you should have regular check-up appointments with your healthcare provider that is prescribing your Eliquis.    What do you do if you miss a dose? If a dose of ELIQUIS is not taken at the scheduled time, take it as soon as possible on the same day and twice-daily administration should be resumed. The dose should not be doubled to make up for a missed dose.  Important Safety Information A possible side effect of Eliquis is bleeding. You should call your healthcare provider right away if you experience any of the following: Bleeding from an injury or your nose that does not stop. Unusual colored urine (red or dark brown) or unusual colored stools (red or black). Unusual bruising for  unknown reasons. A serious fall or if you hit your head (even if there is no bleeding).  Some medicines may interact with Eliquis and might increase your risk of bleeding or clotting while on Eliquis. To help avoid this, consult your healthcare provider or pharmacist prior to using any new prescription or non-prescription medications, including herbals, vitamins, non-steroidal anti-inflammatory drugs (NSAIDs) and supplements.  This website has more information on Eliquis (apixaban): http://www.eliquis.com/eliquis/home

## 2020-09-23 NOTE — Progress Notes (Addendum)
Progress Note  Patient Name: Rick Schwartz Date of Encounter: 09/23/2020  [facility] Cardiologist: previously seen by Dr. Gwenlyn Found in 2017, Dr. Acie Fredrickson  Subjective   Denies any CP or SOB. Leg sore  Inpatient Medications    Scheduled Meds:  acetaminophen  650 mg Oral Q6H   Or   acetaminophen  650 mg Rectal Q6H   apixaban  10 mg Oral BID   Followed by   Derrill Memo ON 09/29/2020] apixaban  5 mg Oral BID   feeding supplement  237 mL Oral BID BM   furosemide  20 mg Oral Daily   Gerhardt's butt cream  1 application Topical TID   losartan  25 mg Oral Daily   multivitamin with minerals  1 tablet Oral Daily   potassium chloride  40 mEq Oral Once   Or   potassium chloride  40 mEq Oral Once   rosuvastatin  20 mg Oral Daily   Continuous Infusions:  magnesium sulfate bolus IVPB 2 g (09/23/20 0915)   PRN Meds:    Vital Signs    Vitals:   09/22/20 2044 09/23/20 0006 09/23/20 0411 09/23/20 0740  BP: 94/70 99/62 104/63 101/68  Pulse: 97 88 91 92  Resp: 17 18 17 18   Temp: 97.8 F (36.6 C) 97.7 F (36.5 C) (!) 97.5 F (36.4 C) (!) 97.4 F (36.3 C)  TempSrc: Oral Oral Oral Oral  SpO2: 98% 100% 100% 99%  Weight:   47 kg   Height:        Intake/Output Summary (Last 24 hours) at 09/23/2020 0923 Last data filed at 09/23/2020 0816 Gross per 24 hour  Intake 1189 ml  Output 300 ml  Net 889 ml   Last 3 Weights 09/23/2020 09/22/2020 09/20/2020  Weight (lbs) 103 lb 9.9 oz 99 lb 3.3 oz 110 lb  Weight (kg) 47 kg 45 kg 49.896 kg      Telemetry    NSR with PVCs, no significant ventricular ectopy - Personally Reviewed  ECG    NSR with nonspecific T wave abnormality - Personally Reviewed  Physical Exam   GEN: No acute distress.   Neck: No JVD Cardiac: RRR, no murmurs, rubs, or gallops.  Respiratory: Clear to auscultation bilaterally. GI: Soft, nontender, non-distended  MS: No edema; hand contracture noted, s/p R AKA Neuro:  unable to assess Psych: unable to assess  Labs    High  Sensitivity Troponin:   Recent Labs  Lab 09/20/20 2303 09/21/20 0351  TROPONINIHS 22* 24*      Chemistry Recent Labs  Lab 09/20/20 1825 09/20/20 1958 09/21/20 0750 09/22/20 0336 09/23/20 0525  NA 140   < > 140 143 138  K 3.6   < > 3.7 3.6 3.6  CL 110  --  112* 111 108  CO2 23  --  21* 23 24  GLUCOSE 122*  --  124* 100* 86  BUN 9  --  12 11 11   CREATININE 0.51*  --  0.52* 0.57* 0.54*  CALCIUM 8.6*  --  8.8* 9.0 8.9  PROT 5.6*  --  5.5*  --   --   ALBUMIN 2.4*  --  2.3*  --   --   AST 15  --  16  --   --   ALT 13  --  11  --   --   ALKPHOS 60  --  52  --   --   BILITOT 0.8  --  0.8  --   --  GFRNONAA >60  --  >60 >60 >60  ANIONGAP 7  --  7 9 6    < > = values in this interval not displayed.     Hematology Recent Labs  Lab 09/20/20 1825 09/20/20 1958 09/20/20 2016 09/21/20 0750 09/22/20 0336  WBC 6.4  --   --  5.9 5.1  RBC 4.42  --   --  4.48 4.55  HGB 12.9*   < > 13.6 12.9* 13.0  HCT 41.0   < > 40.0 42.4 41.0  MCV 92.8  --   --  94.6 90.1  MCH 29.2  --   --  28.8 28.6  MCHC 31.5  --   --  30.4 31.7  RDW 14.6  --   --  14.7 14.6  PLT 253  --   --  216 164   < > = values in this interval not displayed.    BNP Recent Labs  Lab 09/20/20 2347  BNP 1,865.8*     DDimer  Recent Labs  Lab 09/20/20 1906  DDIMER 1.68*     Radiology    VAS [place] ABI WITH/WO TBI  Result Date: 09/22/2020  LOWER EXTREMITY DOPPLER STUDY Patient Name:  RITO LECOMTE  Date of Exam:   09/22/2020 Medical Rec #: [id number]       Accession #:    [id number] Date of Birth: January 30, 1941       Patient Gender: M Patient Age:   80Y Exam Location[facility]  [facility] Procedure:      VAS [place] ABI WITH/WO TBI Referring Phys: [id number] MARGARET E PRAY --------------------------------------------------------------------------------  Indications: Ulceration, and peripheral artery disease. High Risk Factors: Hypertension, hyperlipidemia.  Limitations: Today's exam was limited due to Right AKA, right upper  extremity              contracture. Performing Technologist: Maudry Mayhew MHA, RVT, RDCS, RDMS  Examination Guidelines: A complete evaluation includes at minimum, Doppler waveform signals and systolic blood pressure reading at the level of bilateral brachial, anterior tibial, and posterior tibial arteries, when vessel segments are accessible. Bilateral testing is considered an integral part of a complete examination. Photoelectric Plethysmograph (PPG) waveforms and toe systolic pressure readings are included as required and additional duplex testing as needed. Limited examinations for reoccurring indications may be performed as noted.  ABI Findings: +---------+------------------+-----+----------+-------+ Left     Lt Pressure (mmHg)IndexWaveform  Comment +---------+------------------+-----+----------+-------+ Brachial 84                     triphasic         +---------+------------------+-----+----------+-------+ PTA                             absent            +---------+------------------+-----+----------+-------+ DP       30                0.36 monophasic        +---------+------------------+-----+----------+-------+ Great Toe                       Absent            +---------+------------------+-----+----------+-------+ +-------+-----------+-----------+------------+------------+ ABI/TBIToday's ABIToday's TBIPrevious ABIPrevious TBI +-------+-----------+-----------+------------+------------+ Right  AKA                                            +-------+-----------+-----------+------------+------------+  Left   0.36       0.00                                +-------+-----------+-----------+------------+------------+  Summary: Right: AKA. Left: Resting left ankle-brachial index indicates severe left lower extremity arterial disease. The left great toe flow is absent.  *See table(s) above for measurements and observations.  Electronically signed by Monica Martinez MD on 09/22/2020 at 5:43:42 PM.    Final     Cardiac Studies   Echo 09/21/2020 1. Left ventricular ejection fraction, by estimation, is <20%. The left  ventricle has severely decreased function. The left ventricle demonstrates  global hypokinesis. Left ventricular diastolic parameters are  indeterminate.   2. Right ventricular systolic function is moderately reduced. The right  ventricular size is normal.   3. Left atrial size was mildly dilated.   4. Right atrial size was mildly dilated.   5. Moderate pleural effusion in both left and right lateral regions.   6. The mitral valve is normal in structure. Mild mitral valve  regurgitation. No evidence of mitral stenosis.   7. The aortic valve is tricuspid. There is moderate calcification of the  aortic valve. Aortic valve regurgitation is not visualized. Mild to  moderate aortic valve sclerosis/calcification is present, without any  evidence of aortic stenosis.   8. The inferior vena cava is normal in size with <50% respiratory  variability, suggesting right atrial pressure of 8 mmHg.   Comparison(s): No prior Echocardiogram.   Patient Profile     80 y.o. 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 Echo showed new severe LV dysfunction with EF <20%.   Assessment & Plan   Acute systolic CHF - unclear etiology, however with his cerebral palsy and very poor functional ability, he is not a interventional candidate. Plan medical management with heart failure therapy. He is being followed by palliative care service as outpatient.   - started on low dose losartan 25mg  daily. Unable to add toprol XL as SBP in the 90-100 range.   Acute PE: seen of CTA of RLL. Restarted on home Eliquis, DVT/PE dosing, compliance at home need to be closely wathced.    Severe cerebral palsy   PAD: s/p R AKA 07/2020, ABI  obtained on 09/22/2020 showed severe LLE arterial disease with ABI 0.36. Unable if he will be a candidate peripheral angiography. Unable to add Pletal due to Eliquis     For questions or updates, please contact [place] Please consult [email or web address] for contact info under        Signed, Almyra Deforest, [place]  09/23/2020, 9:23 AM    Attending Note:   The patient was seen and examined.  Agree with assessment and plan as noted above.  Changes made to the above note as needed.  Patient seen and independently examined with Almyra Deforest, PA .   We discussed all aspects of the encounter. I agree with the assessment and plan as stated above.     Chronic systolic CHF .  EF < 20% .  Cont with low dose losartan.  Unable to add toprol xl at this time  2.  Pulmonary embolus.  On eliquis   3.  Severe cerebral palsy:   4.  PAD   has severe  LLE arterial disease.   He is not a candidate for PV surgery or procedures.  He will need to follow up with a primary MD    I have spent a total of 40 minutes with patient reviewing hospital  notes , telemetry, EKGs, labs and examining patient as well as establishing an assessment and plan that was discussed with the patient.  > 50% of time was spent in direct patient care.    Thayer Headings, Brooke Bonito., MD, [facility] 09/23/2020, 11:43 AM [address],  [place] Pager [phone]

## 2020-09-24 DIAGNOSIS — I5021 Acute systolic (congestive) heart failure: Secondary | ICD-10-CM

## 2020-09-24 DIAGNOSIS — I5022 Chronic systolic (congestive) heart failure: Secondary | ICD-10-CM

## 2020-09-24 DIAGNOSIS — E440 Moderate protein-calorie malnutrition: Secondary | ICD-10-CM

## 2020-09-24 DIAGNOSIS — I739 Peripheral vascular disease, unspecified: Secondary | ICD-10-CM

## 2020-09-24 DIAGNOSIS — Z89611 Acquired absence of right leg above knee: Secondary | ICD-10-CM

## 2020-09-24 LAB — BASIC METABOLIC PANEL
Anion gap: 3 — ABNORMAL LOW (ref 5–15)
BUN: 11 mg/dL (ref 8–23)
CO2: 25 mmol/L (ref 22–32)
Calcium: 9 mg/dL (ref 8.9–10.3)
Chloride: 108 mmol/L (ref 98–111)
Creatinine, Ser: 0.55 mg/dL — ABNORMAL LOW (ref 0.61–1.24)
GFR, Estimated: >60 mL/min (ref >60)
Glucose, Bld: 98 mg/dL (ref 70–99)
Potassium: 4.4 mmol/L (ref 3.5–5.1)
Sodium: 136 mmol/L (ref 135–145)

## 2020-09-24 LAB — HEMOGLOBIN AND HEMATOCRIT, BLOOD
HCT: 39.1 % (ref 39.0–52.0)
Hemoglobin: 12.5 g/dL — ABNORMAL LOW (ref 13.0–17.0)

## 2020-09-24 LAB — MAGNESIUM: Magnesium: 2 mg/dL (ref 1.7–2.4)

## 2020-09-24 NOTE — Progress Notes (Signed)
Daily Progress Note   Patient Name: Rick Schwartz       Date: 09/24/2020 DOB: [birth year]  Age: 80 y.o. MRN#: [id number] Attending Physician: Zenia Resides, MD Primary Care Physician: Lurline Del, DO Admit Date: 09/20/2020 Length of Stay: 3 days  Reason for Consultation/Follow-up: Establishing goals of care  HPI/Patient Profile:  80 y.o. male  with past medical history of cerebral palsy, HTN, foot ulcer, severe protein calorie malnutrition, DVT, prostate cancer, PAD, and HLD admitted on 09/20/2020 with shortness of breath, tachycardia, and tachypnea. Found to have a PE, bilateral pleural effusions, and newly diagnosed CHF (EF <20%)   Current Medications: Scheduled Meds:   acetaminophen  650 mg Oral Q6H   Or   acetaminophen  650 mg Rectal Q6H   apixaban  10 mg Oral BID   Followed by   Derrill Memo ON 09/29/2020] apixaban  5 mg Oral BID   feeding supplement  237 mL Oral BID BM   furosemide  20 mg Oral Daily   Gerhardt's butt cream  1 application Topical TID   losartan  25 mg Oral Daily   multivitamin with minerals  1 tablet Oral Daily   rosuvastatin  20 mg Oral Daily    Continuous Infusions:   PRN Meds:   Subjective:   Subjective: Chart Reviewed. Updates received. Patient Assessed. Created space and opportunity for patient  and family to explore thoughts and feelings regarding current medical situation.  Today's Discussion: When I entered the patient's room he was sideways in the bed and appeared uncomfortable.  I asked the assistance of another staff member and we were able to get him repositioned which she felt was much more comfortable.  Otherwise he denies pain, dyspnea, nausea.  States he is doing okay overall.  No complaints today.  We attempted to discuss through some decision-making.  He confirmed that he agrees with DNR status.  He states that he is okay with coming back to the hospital and with ICU care if needed.  When asked about the potential for feeding tubes, either  temporary such as core track or permanent such as PEG tube, he states this is something he would need to talk about with his sister.  I informed him that his sister felt that it was a decision for him to make but I agree it might be helpful for him to discuss it with his family/loved ones before making a decision.  A copy of the MOST form was left for him to help guide discussions with family.  I reviewed the fact that he has a very weak heart, still has small clots in his lungs and will need to be on a blood thinner.  He verbalized understanding.  Review of Systems  Constitutional:        Denies any pain  Respiratory:  Negative for chest tightness and shortness of breath.   Gastrointestinal:  Negative for nausea and vomiting.   Objective:   Vital Signs: BP 105/68 (BP Location: Left Arm)   Pulse 91   Temp (!) 97.4 F (36.3 C) (Oral)   Resp 17   Ht 5\' 7"  (1.702 m)   Wt 47 kg   SpO2 99%   BMI 16.23 kg/m  SpO2: SpO2: 99 % O2 Device: O2 Device: Room Air O2 Flow Rate: O2 Flow Rate (L/min): 2 L/min  Physical Exam: Physical Exam Vitals and nursing note reviewed.  Constitutional:      General: He is not in acute distress.    Appearance:  He is not toxic-appearing.  HENT:     Head: Normocephalic and atraumatic.  Pulmonary:     Effort: Pulmonary effort is normal. No tachypnea, accessory muscle usage or respiratory distress.  Musculoskeletal:     Comments: Noted bilateral UE contractures  Skin:    General: Skin is warm and dry.  Neurological:     Mental Status: He is alert.    SpO2: SpO2: 99 % O2 Device:SpO2: 99 % O2 Flow Rate: .O2 Flow Rate (L/min): 2 L/min  IO: Intake/output summary:  Intake/Output Summary (Last 24 hours) at 09/24/2020 1411 Last data filed at 09/24/2020 0502 Gross per 24 hour  Intake 295 ml  Output 600 ml  Net -305 ml    LBM: Last BM Date: 09/22/20 Baseline Weight: Weight: 49.9 kg Most recent weight: Weight: 47 kg   Palliative Assessment/Data:  30%   Assessment & Plan:   Impression:  80 year old male with previous multiple medical conditions now with pulmonary embolism requiring anticoagulation as well as new onset CHF with an EF less than 20%/severe.  Currently he seems to be doing okay, no dyspnea or chest pain.  We discussed the risks including cardiac dysrhythmia, pulmonary edema, fluid retention, etc.  Encouraged him to have discussions about wishes moving forward, specifically regarding tube feeds  SUMMARY OF RECOMMENDATIONS   DNR [place] with IVF, antibiotics Wants full score of care, including ICU admission, short of resuscitation/intubation Discuss further goals with family, specifically tube feeds Continue to treat the treatable Continued PMT support  Code Status: DNR  Symptom Management: No acute palliative for symptom management needs at this time We are available as needed  Prognosis: < 12 months  Discharge Planning: Home with Home Health  Discussed with: Patient  Thank you for allowing [place] to participate in the care of Sueanne Margarita PMT will continue to support holistically.  Time Total: 45 min  Visit consisted of counseling and education dealing with the complex and emotionally intense issues of symptom management and palliative care in the setting of serious and potentially life-threatening illness. Greater than 50%  of this time was spent counseling and coordinating care related to the above assessment and plan.  Walden Field, NP Palliative Medicine Team  Team Phone # [phone] (Nights/Weekends)  09/24/2020, 2:11 PM

## 2020-09-24 NOTE — Progress Notes (Signed)
Manufacturing engineer [facility]) Community Based Palliative Care       This patient is currently followed by palliative care services in the community.  [facility] will continue to follow for any discharge planning needs and to coordinate continuation of palliative care.    Thank you for the opportunity to participate in this patient's care.     Domenic Moras, BSN, RN [facility] Liaison [phone] (24h on call)

## 2020-09-24 NOTE — Progress Notes (Addendum)
Family Medicine Teaching Service Daily Progress Note Intern Pager: [phone]  Patient name: Rick Schwartz Medical record number: [id number] Date of birth: June 23, 1940 Age: 80 y.o. Gender: male  Primary Care Provider: Lurline Del, DO Consultants: Pulmonary  Code Status: DNR  Pt Overview and Major Events to Date:  09/21/20 : Admitted with new oxygen requirement secondary to PE and bilateral pleural effusions 7/7: Thoracentesis  Assessment and Plan: Rick Schwartz is a 80 y.o. male who was admitted for shortness of breath in the setting of pulmonary embolism and bilateral pleural effusions.  Patient status post thoracentesis and improved respiratory status.   Acute hypoxic respiratory failure in the setting of bilateral pleural effusions and PE, improved s/p thoracentesis Patient with improved respiratory status.   Reports breathing comfortably this morning.  Lung exam improved bilaterally with appropriate air movement.  Oxygen saturations high 90s to 100% intermittently on room air -Continue Eliquis -Monitor hemoglobin, stable 12.5 -Monitor pulse oximetry  Acute on chronic systolic heart failure reduced EF exacerbation Patient with improved respiratory status, wean down to room air. -Cardiology following, appreciate recommendations -Continue losartan -Diuresis with 20 mg Lasix daily  Heel pain, left Patient denies any pain. -Tylenol as needed  PAD status post right AKA Amputation site well-appearing no drainage or redness or warmth to touch. -Continue  FEN/GI: Dysphagia diet  PPx: Eliquis  Dispo:Home with home health  tomorrow. Barriers include continued discussion for goals of care with family with disposition planning.   Patient does not need any further medical interventions at this time and look forward to discharging patient safely based on family preference.   Subjective:  Patient denies any current pain or shortness of breath.  Objective: Temp:  [97.4 F (36.3  C)-98.4 F (36.9 C)] 97.6 F (36.4 C) (07/09 0028) Pulse Rate:  [81-92] 81 (07/09 0501) Resp:  [17-20] 19 (07/09 0501) BP: (98-126)/(52-88) 101/62 (07/09 0501) SpO2:  [99 %-100 %] 100 % (07/09 0501) Weight:  [47 kg] 47 kg (07/09 0400)  Physical Exam: General: Frail, chronically ill-appearing male in no acute distress lying in bed Cardiovascular: Regular rate and rhythm Respiratory: Stable on room air no increased work of breathing, no retractions Abdomen: Nontender, nondistended, soft Extremities: No lower extremity edema on left, right AKA  Laboratory: Recent Labs  Lab 09/21/20 0750 09/22/20 0336 09/23/20 0947 09/24/20 0144  WBC 5.9 5.1 3.8*  --   HGB 12.9* 13.0 12.4* 12.5*  HCT 42.4 41.0 40.1 39.1  PLT 216 164 207  --    Recent Labs  Lab 09/20/20 1825 09/20/20 1958 09/21/20 0750 09/22/20 0336 09/23/20 0525 09/24/20 0144  NA 140   < > 140 143 138 136  K 3.6   < > 3.7 3.6 3.6 4.4  CL 110  --  112* 111 108 108  CO2 23  --  21* 23 24 25   BUN 9  --  12 11 11 11   CREATININE 0.51*  --  0.52* 0.57* 0.54* 0.55*  CALCIUM 8.6*  --  8.8* 9.0 8.9 9.0  PROT 5.6*  --  5.5*  --   --   --   BILITOT 0.8  --  0.8  --   --   --   ALKPHOS 60  --  52  --   --   --   ALT 13  --  11  --   --   --   AST 15  --  16  --   --   --  GLUCOSE 122*  --  124* 100* 86 98   < > = values in this interval not displayed.    Imaging/Diagnostic Tests: No new imaging  Eulis Foster, MD 09/24/2020, 5:36 AM PGY-3, [place] Intern pager: [phone], text pages welcome

## 2020-09-24 NOTE — Progress Notes (Signed)
Progress Note  Patient Name: Rick Schwartz Date of Encounter: 09/24/2020  [facility] Cardiologist: None Rick Schwartz 2017, [place])  Subjective   No pain or dyspnea.  Inpatient Medications    Scheduled Meds:  acetaminophen  650 mg Oral Q6H   Or   acetaminophen  650 mg Rectal Q6H   apixaban  10 mg Oral BID   Followed by   Derrill Memo ON 09/29/2020] apixaban  5 mg Oral BID   feeding supplement  237 mL Oral BID BM   furosemide  20 mg Oral Daily   Gerhardt's butt cream  1 application Topical TID   losartan  25 mg Oral Daily   multivitamin with minerals  1 tablet Oral Daily   rosuvastatin  20 mg Oral Daily   Continuous Infusions:  PRN Meds:    Vital Signs    Vitals:   09/24/20 0028 09/24/20 0400 09/24/20 0501 09/24/20 0828  BP: 126/88  101/62 (!) 88/74  Pulse: 85  81 88  Resp: 17  19 17   Temp: 97.6 F (36.4 C)   (!) 97.4 F (36.3 C)  TempSrc: Oral   Oral  SpO2: 100%  100%   Weight:  47 kg    Height:        Intake/Output Summary (Last 24 hours) at 09/24/2020 1007 Last data filed at 09/24/2020 0502 Gross per 24 hour  Intake 295 ml  Output 600 ml  Net -305 ml   Last 3 Weights 09/24/2020 09/23/2020 09/22/2020  Weight (lbs) 103 lb 9.9 oz 103 lb 9.9 oz 99 lb 3.3 oz  Weight (kg) 47 kg 47 kg 45 kg      Telemetry    SR with PVCs, a single 8-beat NSVT - Personally Reviewed  ECG    NSR, nonspecific T wave changes  - Personally Reviewed  Physical Exam   GEN: No acute distress.   Neck: No JVD Cardiac: RRR, no murmurs, rubs, or gallops.  Respiratory: Clear to auscultation bilaterally. GI: Soft, nontender, non-distended  MS: No edema; R AKA. Multiple hand/upper extremity contractures Neuro:  unable to assess Psych: N/A  Labs    High Sensitivity Troponin:   Recent Labs  Lab 09/20/20 2303 09/21/20 0351  TROPONINIHS 22* 24*      Chemistry Recent Labs  Lab 09/20/20 1825 09/20/20 1958 09/21/20 0750 09/22/20 0336 09/23/20 0525 09/24/20 0144  NA 140   < > 140  143 138 136  K 3.6   < > 3.7 3.6 3.6 4.4  CL 110  --  112* 111 108 108  CO2 23  --  21* 23 24 25   GLUCOSE 122*  --  124* 100* 86 98  BUN 9  --  12 11 11 11   CREATININE 0.51*  --  0.52* 0.57* 0.54* 0.55*  CALCIUM 8.6*  --  8.8* 9.0 8.9 9.0  PROT 5.6*  --  5.5*  --   --   --   ALBUMIN 2.4*  --  2.3*  --   --   --   AST 15  --  16  --   --   --   ALT 13  --  11  --   --   --   ALKPHOS 60  --  52  --   --   --   BILITOT 0.8  --  0.8  --   --   --   GFRNONAA >60  --  >60 >60 >60 >60  ANIONGAP 7  --  7 9 6  3*   < > = values in this interval not displayed.     Hematology Recent Labs  Lab 09/21/20 0750 09/22/20 0336 09/23/20 0947 09/24/20 0144  WBC 5.9 5.1 3.8*  --   RBC 4.48 4.55 4.36  --   HGB 12.9* 13.0 12.4* 12.5*  HCT 42.4 41.0 40.1 39.1  MCV 94.6 90.1 92.0  --   MCH 28.8 28.6 28.4  --   MCHC 30.4 31.7 30.9  --   RDW 14.7 14.6 14.5  --   PLT 216 164 207  --     BNP Recent Labs  Lab 09/20/20 2347  BNP 1,865.8*     DDimer  Recent Labs  Lab 09/20/20 1906  DDIMER 1.68*     Radiology    VAS [place] ABI WITH/WO TBI  Result Date: 09/22/2020  LOWER EXTREMITY DOPPLER STUDY Patient Name:  Rick Schwartz  Date of Exam:   09/22/2020 Medical Rec #: [id number]       Accession #:    [id number] Date of Birth: Dec 18, 1940       Patient Gender: M Patient Age:   80Y Exam Location:  [facility] Procedure:      VAS [place] ABI WITH/WO TBI Referring Phys: [id number] Rick Schwartz --------------------------------------------------------------------------------  Indications: Ulceration, and peripheral artery disease. High Risk Factors: Hypertension, hyperlipidemia.  Limitations: Today's exam was limited due to Right AKA, right upper extremity              contracture. Performing Technologist: Maudry Mayhew MHA, RVT, RDCS, RDMS  Examination Guidelines: A complete evaluation includes at minimum, Doppler waveform signals and systolic blood pressure reading at the level of bilateral brachial,  anterior tibial, and posterior tibial arteries, when vessel segments are accessible. Bilateral testing is considered an integral part of a complete examination. Photoelectric Plethysmograph (PPG) waveforms and toe systolic pressure readings are included as required and additional duplex testing as needed. Limited examinations for reoccurring indications may be performed as noted.  ABI Findings: +---------+------------------+-----+----------+-------+ Left     Lt Pressure (mmHg)IndexWaveform  Comment +---------+------------------+-----+----------+-------+ Brachial 84                     triphasic         +---------+------------------+-----+----------+-------+ PTA                             absent            +---------+------------------+-----+----------+-------+ DP       30                0.36 monophasic        +---------+------------------+-----+----------+-------+ Great Toe                       Absent            +---------+------------------+-----+----------+-------+ +-------+-----------+-----------+------------+------------+ ABI/TBIToday's ABIToday's TBIPrevious ABIPrevious TBI +-------+-----------+-----------+------------+------------+ Right  AKA                                            +-------+-----------+-----------+------------+------------+ Left   0.36       0.00                                +-------+-----------+-----------+------------+------------+  Summary: Right: AKA. Left: Resting left ankle-brachial index indicates severe left lower extremity arterial disease. The left great toe flow is absent.  *See table(s) above for measurements and observations.  Electronically signed by Rick Martinez MD on 09/22/2020 at 5:43:42 PM.    Final     Cardiac Studies    Echo 09/21/2020 1. Left ventricular ejection fraction, by estimation, is <20%. The left  ventricle has severely decreased function. The left ventricle demonstrates  global hypokinesis. Left  ventricular diastolic parameters are  indeterminate.   2. Right ventricular systolic function is moderately reduced. The right  ventricular size is normal.   3. Left atrial size was mildly dilated.   4. Right atrial size was mildly dilated.   5. Moderate pleural effusion in both left and right lateral regions.   6. The mitral valve is normal in structure. Mild mitral valve  regurgitation. No evidence of mitral stenosis.   7. The aortic valve is tricuspid. There is moderate calcification of the  aortic valve. Aortic valve regurgitation is not visualized. Mild to  moderate aortic valve sclerosis/calcification is present, without any  evidence of aortic stenosis.   8. The inferior vena cava is normal in size with <50% respiratory  variability, suggesting right atrial pressure of 8 mmHg.   Comparison(s): No prior Echocardiogram.  Patient Profile     80 y.o. 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 Echo showed new severe LV dysfunction with EF <20%.   Assessment & Plan    Acute systolic CHF - unclear etiology, however with his cerebral palsy and very poor functional ability, he is not a interventional candidate. Plan medical management with heart failure therapy. He is being followed by palliative care service as outpatient. - started on low dose losartan 25mg  daily. BP precludes beta blockers .   Acute PE: possible suboptimal compliance with home DOAC? Eliquis restarted and this will require careful monitoring.   Severe cerebral palsy   PAD: s/p R AKA 07/2020,  Left ABI obtained on 09/22/2020 showed severe LLE arterial disease with ABI 0.36. Asymptomatic, but nonambulatory. Poor candidate for invasive procedures. On highly effective statin. LDL has been around 60 for last several years, currently 41 (again raise concern regarding full compliance w  home meds).     For questions or updates, please contact [place] Please consult [email or web address] for contact info under        Signed, Sanda Klein, MD  09/24/2020, 10:07 AM

## 2020-09-24 NOTE — Progress Notes (Addendum)
Family Medicine Teaching Service Daily Progress Note Intern Pager: [phone]  Patient name: Rick Schwartz Medical record number: [id number] Date of birth: Apr 02, 1940 Age: 80 y.o. Gender: male  Primary Care Provider: Lurline Del, DO Consultants: pulmonology, cardiology, palliative care   Code Status: DNR  Pt Overview and Major Events to Date:  09/21/20 : Admitted with new oxygen requirement secondary to PE and bilateral pleural effusions 7/7: Thoracentesis  Assessment and Plan: Rick Schwartz is a 80 y.o. male  admitted for shortness of breath in the setting of pulmonary embolism and bilateral pleural effusions.  Patient status post thoracentesis and improved respiratory status.  PMHx significant for: CP, HTN, foot ulcer, severe protein calorie malnutrition, DVT, prostate cancer, PAD, and HLD.  PE with bilateral pleural effusions Improved s/p thoracentesis, maintaining adequate SpO2 on room air. Hgb 12.3, stable.  - continue apixaban  Asymptomatic non-sustained V tach 17 beats V tach on tele overnight, asymptomatic. No significant electrolyte abnormalities. - cardiology following, appreciate recommendations - d/c cardiac monitoring  Acute on chronic HFrEF exacerbation Improving, on room air. No BB given soft BP. - cardiology following, appreciate recommendations - continue losartan - furosemide 20 mg daily  Left heel pain No pain currently. - APAP prn  PAD s/p right AKA Right AKA 07/2020. Severe arterial disease on recent left ABI 7/7, poor candidate for invasive procedures. - continue statin  GOC discussion Palliative care involved. Patient to discuss with family regarding tube feeds. - DNR - full scope of care, including IV fluids, ICU admission, and antibiotics - further discussion with family  FEN/GI: dysphagia diet PPx: apixaban  Disposition: home with home health vs ALF. Barriers include continued discussion for goals of care with family with disposition planning.     Patient does not need any further medical interventions at this time and look forward to discharging patient safely based on family preference.     Subjective:  Overnight, patient had 17 beats of V tach on tele. Patient was asymptomatic and was unaware. Denies any pain currently. No other concerns.  Objective: Temp:  [97.4 F (36.3 C)-97.9 F (36.6 C)] 97.5 F (36.4 C) (07/10 0000) Pulse Rate:  [85-91] 85 (07/10 0410) Resp:  [17-20] 20 (07/10 0410) BP: (88-105)/(53-83) 105/83 (07/10 0410) SpO2:  [94 %-99 %] 96 % (07/10 0410) Weight:  [46 kg] 46 kg (07/10 0001) Physical Exam: General: Frail, chronically ill-appearing, NAD Cardiovascular: RRR, no murmurs Respiratory: CTAB, breathing comfortably on room air Abdomen: soft, non-tender Extremities: bilateral upper extremity contractures, no edema to left lower extremity, s/p right AKA  Laboratory: Recent Labs  Lab 09/21/20 0750 09/22/20 0336 09/23/20 0947 09/24/20 0144 09/25/20 0241  WBC 5.9 5.1 3.8*  --   --   HGB 12.9* 13.0 12.4* 12.5* 12.3*  HCT 42.4 41.0 40.1 39.1 38.1*  PLT 216 164 207  --   --    Recent Labs  Lab 09/20/20 1825 09/20/20 1958 09/21/20 0750 09/22/20 0336 09/23/20 0525 09/24/20 0144 09/25/20 0241  NA 140   < > 140   < > 138 136 138  K 3.6   < > 3.7   < > 3.6 4.4 3.8  CL 110  --  112*   < > 108 108 106  CO2 23  --  21*   < > 24 25 25   BUN 9  --  12   < > 11 11 13   CREATININE 0.51*  --  0.52*   < > 0.54* 0.55* 0.48*  CALCIUM 8.6*  --  8.8*   < > 8.9 9.0 9.1  PROT 5.6*  --  5.5*  --   --   --   --   BILITOT 0.8  --  0.8  --   --   --   --   ALKPHOS 60  --  52  --   --   --   --   ALT 13  --  11  --   --   --   --   AST 15  --  16  --   --   --   --   GLUCOSE 122*  --  124*   < > 86 98 91   < > = values in this interval not displayed.   EKG: occasional PVCs  Imaging/Diagnostic Tests: No results found.   Zola Button, MD 09/25/2020, 6:46 AM PGY-2, [place] Intern  pager: [phone], text pages welcome

## 2020-09-24 NOTE — Progress Notes (Signed)
FPTS Brief Progress Note  S: Patient sleeping soundly in bed.  Normal work of breathing on room air   O: BP 126/88 (BP Location: Left Arm)   Pulse 85   Temp 97.6 F (36.4 C) (Oral)   Resp 17   Ht 5\' 7"  (1.702 m)   Wt 47 kg   SpO2 100%   BMI 16.23 kg/m   General: Patient sleeping comfortably in bed.  No acute distress Cardio: RRR, normal S1/S2 Lungs:  CTA bilaterally, normal breath sounds  A/P: 80 year old male s/p thoracentesis for pleural effusion with new onset severe HFrEF and past medical history of cerebral palsy, prostate cancer, HTN, PAD, and DVT. -Switch to Eliquis 10 mg twice daily -Daily BMP, H&H, magnesium -Following with palliative care, pulmonology, and cardiology  -Continue to follow plan outlined by day team's progress note - Orders reviewed. Labs for AM ordered, which was adjusted as needed.    Precious Gilding, DO 09/24/2020, 12:47 AM PGY-1, [place] [facility] Night Resident  Please page [phone] with questions.

## 2020-09-25 LAB — CULTURE, BLOOD (ROUTINE X 2)
Culture: NO GROWTH
Culture: NO GROWTH

## 2020-09-25 LAB — BASIC METABOLIC PANEL
Anion gap: 7 (ref 5–15)
BUN: 13 mg/dL (ref 8–23)
CO2: 25 mmol/L (ref 22–32)
Calcium: 9.1 mg/dL (ref 8.9–10.3)
Chloride: 106 mmol/L (ref 98–111)
Creatinine, Ser: 0.48 mg/dL — ABNORMAL LOW (ref 0.61–1.24)
GFR, Estimated: >60 mL/min (ref >60)
Glucose, Bld: 91 mg/dL (ref 70–99)
Potassium: 3.8 mmol/L (ref 3.5–5.1)
Sodium: 138 mmol/L (ref 135–145)

## 2020-09-25 LAB — BODY FLUID CULTURE W GRAM STAIN

## 2020-09-25 LAB — HEMOGLOBIN AND HEMATOCRIT, BLOOD
HCT: 38.1 % — ABNORMAL LOW (ref 39.0–52.0)
Hemoglobin: 12.3 g/dL — ABNORMAL LOW (ref 13.0–17.0)

## 2020-09-25 LAB — MAGNESIUM: Magnesium: 1.9 mg/dL (ref 1.7–2.4)

## 2020-09-25 MED ORDER — METOPROLOL SUCCINATE ER 25 MG PO TB24
12.5000 mg | ORAL_TABLET | Freq: Every day | ORAL | Status: DC
  Administered 2020-09-25 – 2020-09-27 (×3): 12.5 mg via ORAL
  Filled 2020-09-25 (×3): qty 1

## 2020-09-25 NOTE — Progress Notes (Signed)
FPTS Interim Progress Note  S: Patient asleep during night rounds.  O: BP (!) 100/51 (BP Location: Left Arm)   Pulse 84   Temp 97.9 F (36.6 C) (Oral)   Resp 17   Ht 5\' 7"  (1.702 m)   Wt 46 kg   SpO2 92%   BMI 15.88 kg/m   General: Thin older gentleman sleeping comfortably in bed, no acute distress Pulm: On room air, normal work of breathing  A/P: Rick Schwartz is a 79 year old male with a history of severe cerebral palsy, prostate cancer, DVT, protein calorie malnutrition who presented with bilateral PE with associated pleural effusions.   Acute on chronic HFrEF exacerbation Cardiology started him on low-dose metoprolol succinate today.  Maintaining sats on room air.  Most recent BP 100/51. -Continue losartan and metoprolol succinate -Continue daily Lasix  Bilateral PE with associated pleural effusion Maintaining sats on room air.  Hemoglobin stable since restarting Eliquis.  12.3 this morning -Continue Eliquis -Closely monitor for signs of bleeding   Rick Gibson, MD 09/26/2020, 12:02 AM PGY-1, [place] Medicine Service pager [phone]

## 2020-09-25 NOTE — Progress Notes (Signed)
Progress Note  Patient Name: Rick Schwartz Date of Encounter: 09/25/2020  [facility] Cardiologist: None Gwenlyn Found 2017, [place])  Subjective   Appears comfortable.  Inpatient Medications    Scheduled Meds:  acetaminophen  650 mg Oral Q6H   Or   acetaminophen  650 mg Rectal Q6H   apixaban  10 mg Oral BID   Followed by   Derrill Memo ON 09/29/2020] apixaban  5 mg Oral BID   feeding supplement  237 mL Oral BID BM   furosemide  20 mg Oral Daily   Gerhardt's butt cream  1 application Topical TID   losartan  25 mg Oral Daily   multivitamin with minerals  1 tablet Oral Daily   rosuvastatin  20 mg Oral Daily   Continuous Infusions:  PRN Meds:    Vital Signs    Vitals:   09/25/20 0000 09/25/20 0001 09/25/20 0410 09/25/20 0729  BP: (!) 103/53  105/83 101/64  Pulse: 91  85 87  Resp: 19  20 20   Temp: (!) 97.5 F (36.4 C)     TempSrc: Oral     SpO2: 99%  96% 97%  Weight:  46 kg    Height:        Intake/Output Summary (Last 24 hours) at 09/25/2020 0900 Last data filed at 09/25/2020 0413 Gross per 24 hour  Intake 413 ml  Output 850 ml  Net -437 ml   Last 3 Weights 09/25/2020 09/24/2020 09/23/2020  Weight (lbs) 101 lb 6.6 oz 103 lb 9.9 oz 103 lb 9.9 oz  Weight (kg) 46 kg 47 kg 47 kg      Telemetry    SR with PVCs, a single episode of NSVT at 0330h (18 beats, but not particularly fast) - Personally Reviewed  ECG    No new tracing - Personally Reviewed  Physical Exam   GEN: No acute distress.   Neck: No JVD Cardiac: RRR, no murmurs, rubs, or gallops.  Respiratory: Clear to auscultation bilaterally. 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 Neuro:  unable to assess Psych: unable to assess  Labs    High Sensitivity Troponin:   Recent Labs  Lab 09/20/20 2303 09/21/20 0351  TROPONINIHS 22* 24*      Chemistry Recent Labs  Lab 09/20/20 1825 09/20/20 1958 09/21/20 0750 09/22/20 0336  09/23/20 0525 09/24/20 0144 09/25/20 0241  NA 140   < > 140   < > 138 136 138  K 3.6   < > 3.7   < > 3.6 4.4 3.8  CL 110  --  112*   < > 108 108 106  CO2 23  --  21*   < > 24 25 25   GLUCOSE 122*  --  124*   < > 86 98 91  BUN 9  --  12   < > 11 11 13   CREATININE 0.51*  --  0.52*   < > 0.54* 0.55* 0.48*  CALCIUM 8.6*  --  8.8*   < > 8.9 9.0 9.1  PROT 5.6*  --  5.5*  --   --   --   --   ALBUMIN 2.4*  --  2.3*  --   --   --   --   AST 15  --  16  --   --   --   --   ALT 13  --  11  --   --   --   --  ALKPHOS 60  --  52  --   --   --   --   BILITOT 0.8  --  0.8  --   --   --   --   GFRNONAA >60  --  >60   < > >60 >60 >60  ANIONGAP 7  --  7   < > 6 3* 7   < > = values in this interval not displayed.     Hematology Recent Labs  Lab 09/21/20 0750 09/22/20 0336 09/23/20 0947 09/24/20 0144 09/25/20 0241  WBC 5.9 5.1 3.8*  --   --   RBC 4.48 4.55 4.36  --   --   HGB 12.9* 13.0 12.4* 12.5* 12.3*  HCT 42.4 41.0 40.1 39.1 38.1*  MCV 94.6 90.1 92.0  --   --   MCH 28.8 28.6 28.4  --   --   MCHC 30.4 31.7 30.9  --   --   RDW 14.7 14.6 14.5  --   --   PLT 216 164 207  --   --     BNP Recent Labs  Lab 09/20/20 2347  BNP 1,865.8*     DDimer  Recent Labs  Lab 09/20/20 1906  DDIMER 1.68*     Radiology    No results found.  Cardiac Studies   Echo 09/21/2020 1. Left ventricular ejection fraction, by estimation, is <20%. The left  ventricle has severely decreased function. The left ventricle demonstrates  global hypokinesis. Left ventricular diastolic parameters are  indeterminate.   2. Right ventricular systolic function is moderately reduced. The right  ventricular size is normal.   3. Left atrial size was mildly dilated.   4. Right atrial size was mildly dilated.   5. Moderate pleural effusion in both left and right lateral regions.   6. The mitral valve is normal in structure. Mild mitral valve  regurgitation. No evidence of mitral stenosis.   7. The aortic valve  is tricuspid. There is moderate calcification of the  aortic valve. Aortic valve regurgitation is not visualized. Mild to  moderate aortic valve sclerosis/calcification is present, without any  evidence of aortic stenosis.   8. The inferior vena cava is normal in size with <50% respiratory  variability, suggesting right atrial pressure of 8 mmHg.   Comparison(s): No prior Echocardiogram.  Patient Profile     80 y.o. 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 Echo showed new severe LV dysfunction with EF <20%.     Assessment & Plan      Acute systolic CHF - unclear etiology, however with his cerebral palsy and very poor functional ability, he is not a interventional candidate. Plan medical management with heart failure therapy. He is being followed by palliative care service as outpatient. - started on low dose losartan 25mg  daily. SBP runs fairly low, but consistently >100, so will try low dose metoprolol succinate.   Acute PE: possible suboptimal compliance with home DOAC? Eliquis restarted and this will require careful monitoring.   Severe cerebral palsy   PAD: s/p R AKA 07/2020,  Left ABI obtained on 09/22/2020 showed severe LLE arterial disease with ABI 0.36. Asymptomatic, but nonambulatory. Poor candidate for invasive procedures. On highly effective statin. LDL has been around 60 for last several years, currently 2 (again raise concern regarding full compliance w home  meds).     For questions or updates, please contact [place] Please consult [email or web address] for contact info under        Signed, Sanda Klein, MD  09/25/2020, 9:00 AM

## 2020-09-26 ENCOUNTER — Other Ambulatory Visit (HOSPITAL_COMMUNITY): Payer: Self-pay

## 2020-09-26 DIAGNOSIS — F0150 Vascular dementia without behavioral disturbance: Secondary | ICD-10-CM

## 2020-09-26 LAB — BASIC METABOLIC PANEL
Anion gap: 5 (ref 5–15)
BUN: 11 mg/dL (ref 8–23)
CO2: 26 mmol/L (ref 22–32)
Calcium: 8.7 mg/dL — ABNORMAL LOW (ref 8.9–10.3)
Chloride: 104 mmol/L (ref 98–111)
Creatinine, Ser: 0.56 mg/dL — ABNORMAL LOW (ref 0.61–1.24)
GFR, Estimated: >60 mL/min (ref >60)
Glucose, Bld: 91 mg/dL (ref 70–99)
Potassium: 3.7 mmol/L (ref 3.5–5.1)
Sodium: 135 mmol/L (ref 135–145)

## 2020-09-26 LAB — HEMOGLOBIN AND HEMATOCRIT, BLOOD
HCT: 39.1 % (ref 39.0–52.0)
Hemoglobin: 12.3 g/dL — ABNORMAL LOW (ref 13.0–17.0)

## 2020-09-26 LAB — MAGNESIUM: Magnesium: 1.8 mg/dL (ref 1.7–2.4)

## 2020-09-26 MED ORDER — MAGNESIUM SULFATE IN D5W 1-5 GM/100ML-% IV SOLN
1.0000 g | Freq: Once | INTRAVENOUS | Status: AC
  Administered 2020-09-26: 1 g via INTRAVENOUS
  Filled 2020-09-26: qty 100

## 2020-09-26 MED ORDER — APIXABAN 5 MG PO TABS
10.0000 mg | ORAL_TABLET | Freq: Two times a day (BID) | ORAL | 0 refills | Status: DC
  Filled 2020-09-26: qty 10, 3d supply, fill #0

## 2020-09-26 MED ORDER — APIXABAN 5 MG PO TABS
5.0000 mg | ORAL_TABLET | Freq: Two times a day (BID) | ORAL | 1 refills | Status: DC
  Filled 2020-09-26: qty 70, 30d supply, fill #0

## 2020-09-26 MED ORDER — ACETAMINOPHEN 325 MG PO TABS: 650.0000 mg | ORAL_TABLET | Freq: Four times a day (QID) | ORAL | Status: DC

## 2020-09-26 MED ORDER — LOSARTAN POTASSIUM 25 MG PO TABS
25.0000 mg | ORAL_TABLET | Freq: Every day | ORAL | 1 refills | Status: DC
  Filled 2020-09-26: qty 30, 30d supply, fill #0

## 2020-09-26 MED ORDER — FUROSEMIDE 20 MG PO TABS
20.0000 mg | ORAL_TABLET | Freq: Every day | ORAL | 1 refills | Status: DC
  Filled 2020-09-26: qty 30, 30d supply, fill #0

## 2020-09-26 MED ORDER — POTASSIUM CHLORIDE 20 MEQ PO PACK
20.0000 meq | PACK | Freq: Once | ORAL | Status: AC
  Administered 2020-09-26: 20 meq via ORAL
  Filled 2020-09-26: qty 1

## 2020-09-26 MED ORDER — METOPROLOL SUCCINATE ER 25 MG PO TB24
12.5000 mg | ORAL_TABLET | Freq: Every day | ORAL | 1 refills | Status: DC
  Filled 2020-09-26: qty 30, 60d supply, fill #0

## 2020-09-26 NOTE — TOC Initial Note (Signed)
Transition of Care [facility]) - Initial/Assessment Note    Patient Details  Name: Rick Schwartz MRN: [id number] Date of Birth: 04-18-1940  Transition of [facility]) CM/SW Contact:    Tresa Endo Phone Number: 09/26/2020, 2:18 PM  Clinical Narrative:                 [id number]: CSW contacted pt sister/caregiver about a possible SNF at [place]. Pt sister stated that pt is going home and will not need a SNF. CSW signing off.        Patient Goals and CMS Choice        Expected Discharge Plan and Services                                                Prior Living Arrangements/Services                       Activities of Daily Living Home Assistive Devices/Equipment: None ADL Screening (condition at time of admission) Patient's cognitive ability adequate to safely complete daily activities?: No Is the patient deaf or have difficulty hearing?: No Does the patient have difficulty seeing, even when wearing glasses/contacts?: No Does the patient have difficulty concentrating, remembering, or making decisions?: No Patient able to express need for assistance with ADLs?: Yes Does the patient have difficulty dressing or bathing?: No Independently performs ADLs?: No Communication: Appropriate for developmental age Dressing (OT): Dependent Is this a change from baseline?: Pre-admission baseline Grooming: Dependent Is this a change from baseline?: Pre-admission baseline Feeding: Dependent Is this a change from baseline?: Pre-admission baseline Bathing: Dependent Is this a change from baseline?: Pre-admission baseline Toileting: Dependent Is this a change from baseline?: Pre-admission baseline In/Out Bed: Dependent Is this a change from baseline?: Pre-admission baseline Walks in Home: Dependent Is this a change from baseline?: Pre-admission baseline Does the patient have difficulty walking or climbing stairs?: No Weakness of Legs: Both Weakness of Arms/Hands:  Right  Permission Sought/Granted                  Emotional Assessment              Admission diagnosis:  Shortness of breath [R06.02] Pulmonary embolus ([place]) [I26.99] Pulmonary embolism (H[facility]) [I26.99] Pleural effusion, bilateral [J90] Right pulmonary embolus ([place]) [I26.99] History of thoracentesis [Z98.890] Patient Active Problem List   Diagnosis Date Noted   Chronic HFrEF (heart failure with reduced ejection fraction) ([place])    Dementia without behavioral disturbance ([place])    Pressure injury of skin 09/22/2020   Protein-calorie malnutrition, severe 09/22/2020   Right pulmonary embolus ([place]) 09/21/2020   Pulmonary embolus ([place]) 09/21/2020   Shortness of breath    Pleural effusion, bilateral    SBO (small bowel obstruction) ([place])    Inanition ([place])    Foot ulcer with necrosis of muscle, right ([place]) 07/18/2020   Severe sepsis ([place]) 07/18/2020   Lactic acidosis 07/18/2020   Hypernatremia 07/18/2020   Hypercalcemia 07/18/2020   Protein-calorie malnutrition ([place])    Gangrene of right foot ([place]) 07/17/2020   Focal motor deficit 07/07/2020   Physical deconditioning 05/06/2020   Prediabetes 07/20/2016   Cough 01/21/2012   HALLUX RIGIDUS, 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 [place] [address] Phone: [phone] Fax: [phone]     Social Determinants of Health (SDOH) Interventions    Readmission Risk Interventions No flowsheet data found.

## 2020-09-26 NOTE — Progress Notes (Signed)
Family Medicine Teaching Service Daily Progress Note Intern Pager: [phone]  Patient name: Rick Schwartz Medical record number: [id number] Date of birth: [birth year] Age: 80 y.o. Gender: male  Primary Care Provider: Lurline Del, DO Consultants: Pulm, Cards, Palliative Code Status: DNR  Pt Overview and Major Events to Date:  7/6- Admission 7/6- Thoracentesis  Assessment and Plan: Patient admitted for shortness of breath in the setting of pulmonary embolism and bilateral pleural effusions.  Breathing improved status post thoracentesis.  Found to have newly diagnosed heart failure with reduced ejection fraction less than 20%.  PMH significant for hypertension, severe protein calorie malnutrition, DVT, prostate cancer, peripheral arterial disease with right AKA, cerebral palsy, and hyperlipidemia.   Acute Hypoxia 2/2 to PE  Pleural Effusions  Newly diagnosed Systolic Congestive HFrEF Hypoxia resolved.  Currently on Lasix 20 mg and Losartan 25 mg.  BP dropped to 100's/50's yesterday after starting Metoprolol.  Now stable at110-130's. - Continue Lasix, Losaratan - Continue Eliquis dosing per Pharmacy - Replete Magnesium, Potassium for K>4, Mg >2 - AM BMP & Mg  Left Heel Pain Resolved.   - Continue to monitor  Kirby indicates fine with taking home.  Indicated would need to prepare home for patient and able to take tomorrow morning at earliest. - Continue with Palliative Outpatient  FEN/GI: Dysphagia 2 diet PPx: Eliquis Dispo: ALF vs Home with home health  tomorrow. Barriers include GoC.   Subjective:  Patient indicates   Objective: Temp:  [97.4 F (36.3 C)-97.9 F (36.6 C)] 97.5 F (36.4 C) (07/11 0443) Pulse Rate:  [82-92] 82 (07/11 0443) Resp:  [16-20] 17 (07/11 0443) BP: (100-118)/(51-78) 110/62 (07/11 0443) SpO2:  [92 %-100 %] 93 % (07/11 0443) Weight:  [47 kg] 47 kg (07/11 0006) Physical Exam:   Lungs:  CTAB Heart: Regular rate and rhthym, normal heart  sounds Lower extemities: Right AKA, Left heel non-tender to palaption Neuro: Alert and Oriented x3,   Laboratory: Recent Labs  Lab 09/21/20 0750 09/22/20 0336 09/23/20 0947 09/24/20 0144 09/25/20 0241 09/26/20 0412  WBC 5.9 5.1 3.8*  --   --   --   HGB 12.9* 13.0 12.4* 12.5* 12.3* 12.3*  HCT 42.4 41.0 40.1 39.1 38.1* 39.1  PLT 216 164 207  --   --   --    Recent Labs  Lab 09/20/20 1825 09/20/20 1958 09/21/20 0750 09/22/20 0336 09/24/20 0144 09/25/20 0241 09/26/20 0412  NA 140   < > 140   < > 136 138 135  K 3.6   < > 3.7   < > 4.4 3.8 3.7  CL 110  --  112*   < > 108 106 104  CO2 23  --  21*   < > 25 25 26   BUN 9  --  12   < > 11 13 11   CREATININE 0.51*  --  0.52*   < > 0.55* 0.48* 0.56*  CALCIUM 8.6*  --  8.8*   < > 9.0 9.1 8.7*  PROT 5.6*  --  5.5*  --   --   --   --   BILITOT 0.8  --  0.8  --   --   --   --   ALKPHOS 60  --  52  --   --   --   --   ALT 13  --  11  --   --   --   --   AST 15  --  16  --   --   --   --  GLUCOSE 122*  --  124*   < > 98 91 91   < > = values in this interval not displayed.     Imaging/Diagnostic Tests: No new imaging  Delora Fuel, MD 09/26/2020, 6:53 AM PGY-1, [place] Intern pager: [phone], text pages welcome

## 2020-09-26 NOTE — Care Management Important Message (Signed)
Important Message  Patient Details  Name: AKBAR SACRA MRN: [id number] Date of Birth: [birth year]   Medicare Important Message Given:  Yes     Shelda Altes 09/26/2020, 9:41 AM

## 2020-09-26 NOTE — Progress Notes (Signed)
Progress Note  Patient Name: Rick Schwartz Date of Encounter: 09/26/2020  [facility] Cardiologist: None Gwenlyn Found 2017, [place])  Subjective   Appears comfortable.  Inpatient Medications    Scheduled Meds:  acetaminophen  650 mg Oral Q6H   Or   acetaminophen  650 mg Rectal Q6H   apixaban  10 mg Oral BID   Followed by   Derrill Memo ON 09/29/2020] apixaban  5 mg Oral BID   feeding supplement  237 mL Oral BID BM   furosemide  20 mg Oral Daily   Gerhardt's butt cream  1 application Topical TID   losartan  25 mg Oral Daily   metoprolol succinate  12.5 mg Oral Daily   multivitamin with minerals  1 tablet Oral Daily   rosuvastatin  20 mg Oral Daily   Continuous Infusions:  magnesium sulfate bolus IVPB 1 g (09/26/20 1009)   PRN Meds:    Vital Signs    Vitals:   09/25/20 1916 09/26/20 0006 09/26/20 0443 09/26/20 0758  BP: (!) 100/51 118/78 110/62 112/64  Pulse: 84 89 82 85  Resp: 17 19 17 17   Temp: 97.9 F (36.6 C) (!) 97.5 F (36.4 C) (!) 97.5 F (36.4 C) (!) 97.5 F (36.4 C)  TempSrc: Oral Oral Oral Oral  SpO2: 92% 100% 93% 95%  Weight:  47 kg    Height:        Intake/Output Summary (Last 24 hours) at 09/26/2020 1027 Last data filed at 09/26/2020 0902 Gross per 24 hour  Intake 560 ml  Output 1050 ml  Net -490 ml   Last 3 Weights 09/26/2020 09/25/2020 09/24/2020  Weight (lbs) 103 lb 9.9 oz 101 lb 6.6 oz 103 lb 9.9 oz  Weight (kg) 47 kg 46 kg 47 kg      Telemetry    SR with PVCs, a single episode of NSVT at 0330h (18 beats, but not particularly fast) - Personally Reviewed  ECG    No new tracing - Personally Reviewed  Physical Exam   GEN: No acute distress.   Neck: No JVD Cardiac: RRR, no murmurs, rubs, or gallops.  Respiratory: Clear to auscultation bilaterally. 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 Neuro:  unable to assess Psych: unable to assess  Labs    High Sensitivity  Troponin:   Recent Labs  Lab 09/20/20 2303 09/21/20 0351  TROPONINIHS 22* 24*      Chemistry Recent Labs  Lab 09/20/20 1825 09/20/20 1958 09/21/20 0750 09/22/20 0336 09/24/20 0144 09/25/20 0241 09/26/20 0412  NA 140   < > 140   < > 136 138 135  K 3.6   < > 3.7   < > 4.4 3.8 3.7  CL 110  --  112*   < > 108 106 104  CO2 23  --  21*   < > 25 25 26   GLUCOSE 122*  --  124*   < > 98 91 91  BUN 9  --  12   < > 11 13 11   CREATININE 0.51*  --  0.52*   < > 0.55* 0.48* 0.56*  CALCIUM 8.6*  --  8.8*   < > 9.0 9.1 8.7*  PROT 5.6*  --  5.5*  --   --   --   --   ALBUMIN 2.4*  --  2.3*  --   --   --   --   AST 15  --  16  --   --   --   --   ALT 13  --  11  --   --   --   --   ALKPHOS 60  --  52  --   --   --   --   BILITOT 0.8  --  0.8  --   --   --   --   GFRNONAA >60  --  >60   < > >60 >60 >60  ANIONGAP 7  --  7   < > 3* 7 5   < > = values in this interval not displayed.     Hematology Recent Labs  Lab 09/21/20 0750 09/22/20 0336 09/23/20 0947 09/24/20 0144 09/25/20 0241 09/26/20 0412  WBC 5.9 5.1 3.8*  --   --   --   RBC 4.48 4.55 4.36  --   --   --   HGB 12.9* 13.0 12.4* 12.5* 12.3* 12.3*  HCT 42.4 41.0 40.1 39.1 38.1* 39.1  MCV 94.6 90.1 92.0  --   --   --   MCH 28.8 28.6 28.4  --   --   --   MCHC 30.4 31.7 30.9  --   --   --   RDW 14.7 14.6 14.5  --   --   --   PLT 216 164 207  --   --   --     BNP Recent Labs  Lab 09/20/20 2347  BNP 1,865.8*     DDimer  Recent Labs  Lab 09/20/20 1906  DDIMER 1.68*     Radiology    No results found.  Cardiac Studies   Echo 09/21/2020 1. Left ventricular ejection fraction, by estimation, is <20%. The left  ventricle has severely decreased function. The left ventricle demonstrates  global hypokinesis. Left ventricular diastolic parameters are  indeterminate.   2. Right ventricular systolic function is moderately reduced. The right  ventricular size is normal.   3. Left atrial size was mildly dilated.   4. Right  atrial size was mildly dilated.   5. Moderate pleural effusion in both left and right lateral regions.   6. The mitral valve is normal in structure. Mild mitral valve  regurgitation. No evidence of mitral stenosis.   7. The aortic valve is tricuspid. There is moderate calcification of the  aortic valve. Aortic valve regurgitation is not visualized. Mild to  moderate aortic valve sclerosis/calcification is present, without any  evidence of aortic stenosis.   8. The inferior vena cava is normal in size with <50% respiratory  variability, suggesting right atrial pressure of 8 mmHg.   Comparison(s): No prior Echocardiogram.  Patient Profile     80 y.o. 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 Echo showed new severe LV dysfunction with EF <20%.     Assessment & Plan      Acute systolic CHF - unclear etiology, however with his cerebral palsy and very poor functional ability, he is not a interventional candidate. Plan medical management with heart failure therapy. He is being followed by palliative care service as outpatient. - started on low dose losartan 25mg  daily. SBP runs fairly low, but consistently >100, - metoprolol succinate added - continue daily lasix, labs stable   Acute PE: possible suboptimal compliance with home DOAC? Eliquis restarted and this  will require careful monitoring. Hemoglobin stable   Severe cerebral palsy   PAD: s/p R AKA 07/2020,  Left ABI obtained on 09/22/2020 showed severe LLE arterial disease with ABI 0.36. Asymptomatic, but nonambulatory. Poor candidate for invasive procedures. On highly effective statin. LDL has been around 60 for last several years, currently 39 (again raise concern regarding full compliance w home meds).  ?Dispo - not sure there is anything else to offer from a cardiac standpoint.  [facility]  will sign off.   Medication Recommendations:  as above Other recommendations (labs, testing, etc):  none Follow up as an outpatient:  Dr. Acie Fredrickson or APP in 1-2 weeks     For questions or updates, please contact [facility] Please consult [email or web address] for contact info under   Pixie Casino, MD, FACC, [place] Director of the [facility] Diplomate of the American Board of Clinical Lipidology Attending Cardiologist  Direct Dial: [phone]  Fax: [phone]  Website:  [email or web address]  Pixie Casino, MD  09/26/2020, 10:27 AM

## 2020-09-26 NOTE — Plan of Care (Signed)

## 2020-09-27 NOTE — Progress Notes (Signed)
Physical Therapy Treatment Patient Details Name: Rick Schwartz MRN: [id number] DOB: 03-18-1941 Today's Date: 09/27/2020    History of Present Illness 80 y.o. male presents to [facility] ED on 09/20/2020 SOB, tachycardia and tachpnea. Pt found to have PE and bilateral pleural effusions > + for  staphylococcus . PMHx is significant for cerebral palsy, HTN, foot ulcer, severe protein calorie malnutrition, DVT, prostate cancer, PAD, and HLD.    PT Comments    Patient received in bed, pleasant, agreeable to PT. Patient required total assist of 2 for bed mobility and is unable to maintain sitting at edge of bed without total assist. He attempts to assist with L UE by using rail on bed, but is unsuccessful. Patient will continue to benefit from skilled PT while here to improve strength and mobility if able.     Follow Up Recommendations  Supervision/Assistance - 24 hour;Home health PT     Equipment Recommendations  None recommended by PT    Recommendations for Other Services       Precautions / Restrictions Precautions Precautions: Fall Restrictions Weight Bearing Restrictions: No    Mobility  Bed Mobility Overal bed mobility: Needs Assistance Bed Mobility: Supine to Sit;Sit to Supine     Supine to sit: Total assist Sit to supine: Total assist        Transfers                 General transfer comment: unable due to poor sitting balance.  Ambulation/Gait             General Gait Details: non ambulatory   Stairs             Wheelchair Mobility    Modified Rankin (Stroke Patients Only)       Balance Overall balance assessment: Needs assistance   Sitting balance-Leahy Scale: Zero Sitting balance - Comments: total A, posterior lean                                    Cognition Arousal/Alertness: Awake/alert Behavior During Therapy: WFL for tasks assessed/performed Overall Cognitive Status: No family/caregiver present to determine  baseline cognitive functioning                                        Exercises      General Comments        Pertinent Vitals/Pain Pain Assessment: Faces Faces Pain Scale: Hurts little more Pain Location: L heel Pain Descriptors / Indicators: Grimacing Pain Intervention(s): Monitored during session;Repositioned    Home Living                      Prior Function            PT Goals (current goals can now be found in the care plan section) Acute Rehab PT Goals Patient Stated Goal: sister hopes pt will return home with her Progress towards PT goals: Not progressing toward goals - comment    Frequency    Min 2X/week      PT Plan Current plan remains appropriate    Co-evaluation              AM-PAC PT "6 Clicks" Mobility   Outcome Measure  Help needed turning from your back to your side while in a flat bed without  using bedrails?: Total Help needed moving from lying on your back to sitting on the side of a flat bed without using bedrails?: Total Help needed moving to and from a bed to a chair (including a wheelchair)?: Total Help needed standing up from a chair using your arms (e.g., wheelchair or bedside chair)?: Total Help needed to walk in hospital room?: Total Help needed climbing 3-5 steps with a railing? : Total 6 Click Score: 6    End of Session   Activity Tolerance: Patient tolerated treatment well Patient left: in bed;with call bell/phone within reach;with bed alarm set Nurse Communication: Mobility status PT Visit Diagnosis: Other abnormalities of gait and mobility (R26.89);Muscle weakness (generalized) (M62.81)     Time: 1130-1145 PT Time Calculation (min) (ACUTE ONLY): 15 min  Charges:  $Therapeutic Activity: 8-22 mins                    [employer], PT, GCS 09/27/20,12:42 PM

## 2020-09-27 NOTE — Progress Notes (Signed)
Family Medicine Teaching Service Daily Progress Note Intern Pager: [phone]  Patient name: Rick Schwartz Medical record number: [id number] Date of birth: 23-Aug-1940 Age: 80 y.o. Gender: male  Primary Care Provider: Lurline Del, DO Consultants:  Code Status: Full  Pt Overview and Major Events to Date:  7/6- Admission 7/6- Thoracentesis  Assessment and Plan: Patient admitted for shortness of breath in the setting of pulmonary embolism and bilateral pleural effusions.  Breathing improved status postthoracentesis.  Found to have newly diagnosed heart failure with reduced ejection fraction less than 20%.  Past medical history significant for hypertension, severe protein calorie malnutrition, DVT, prostate cancer, peripheral arterial disease right AKA cerebral palsy, and hyperlipidemia.  Acute Hypoxia Resolved, breathing well on RA. - Plan to discharge home today with HHPT, OT, and Nursing  FEN/GI: Dysphagia 2 diet PPx: Eliquis Dispo:Home today. Barriers include none.   Subjective:  Patient indicates feeling well today and ready to go home.  Objective: Temp:  [97.4 F (36.3 C)-98 F (36.7 C)] 98 F (36.7 C) (07/12 1151) Pulse Rate:  [76-96] 96 (07/12 1151) Resp:  [16-20] 16 (07/12 1151) BP: (93-103)/(49-77) 93/77 (07/12 1151) SpO2:  [93 %-100 %] 95 % (07/12 1151) Weight:  [43 kg] 43 kg (07/12 0542) Physical Exam: General: NAD Cardiovascular: RRR, Normal heart sounds Respiratory: CTAB,  Extremities: Mild tenderness of left heel, no findings of infection  Laboratory: Recent Labs  Lab 09/21/20 0750 09/22/20 0336 09/23/20 0947 09/24/20 0144 09/25/20 0241 09/26/20 0412  WBC 5.9 5.1 3.8*  --   --   --   HGB 12.9* 13.0 12.4* 12.5* 12.3* 12.3*  HCT 42.4 41.0 40.1 39.1 38.1* 39.1  PLT 216 164 207  --   --   --    Recent Labs  Lab 09/20/20 1825 09/20/20 1958 09/21/20 0750 09/22/20 0336 09/24/20 0144 09/25/20 0241 09/26/20 0412  NA 140   < > 140   < > 136 138 135   K 3.6   < > 3.7   < > 4.4 3.8 3.7  CL 110  --  112*   < > 108 106 104  CO2 23  --  21*   < > 25 25 26   BUN 9  --  12   < > 11 13 11   CREATININE 0.51*  --  0.52*   < > 0.55* 0.48* 0.56*  CALCIUM 8.6*  --  8.8*   < > 9.0 9.1 8.7*  PROT 5.6*  --  5.5*  --   --   --   --   BILITOT 0.8  --  0.8  --   --   --   --   ALKPHOS 60  --  52  --   --   --   --   ALT 13  --  11  --   --   --   --   AST 15  --  16  --   --   --   --   GLUCOSE 122*  --  124*   < > 98 91 91   < > = values in this interval not displayed.    Imaging/Diagnostic Tests: No new imaging  Delora Fuel, MD 09/27/2020, 1:21 PM PGY-1, [place] Intern pager: [phone], text pages welcome

## 2020-09-27 NOTE — Plan of Care (Signed)
  Problem: Education: Goal: Knowledge of General Education information will improve Description: Including pain rating scale, medication(s)/side effects and non-pharmacologic comfort measures 09/27/2020 1405 by Salina April, RN Outcome: Adequate for Discharge 09/27/2020 1135 by Salina April, RN Outcome: Progressing   Problem: Health Behavior/Discharge Planning: Goal: Ability to manage health-related needs will improve 09/27/2020 1405 by Salina April, RN Outcome: Adequate for Discharge 09/27/2020 1135 by Salina April, RN Outcome: Progressing   Problem: Clinical Measurements: Goal: Ability to maintain clinical measurements within normal limits will improve 09/27/2020 1405 by Salina April, RN Outcome: Adequate for Discharge 09/27/2020 1135 by Salina April, RN Outcome: Progressing Goal: Will remain free from infection 09/27/2020 1405 by Salina April, RN Outcome: Adequate for Discharge 09/27/2020 1135 by Salina April, RN Outcome: Progressing Goal: Diagnostic test results will improve 09/27/2020 1405 by Salina April, RN Outcome: Adequate for Discharge 09/27/2020 1135 by Salina April, RN Outcome: Progressing Goal: Respiratory complications will improve 09/27/2020 1405 by Salina April, RN Outcome: Adequate for Discharge 09/27/2020 1135 by Salina April, RN Outcome: Progressing Goal: Cardiovascular complication will be avoided 09/27/2020 1405 by Salina April, RN Outcome: Adequate for Discharge 09/27/2020 1135 by Salina April, RN Outcome: Progressing   Problem: Activity: Goal: Risk for activity intolerance will decrease 09/27/2020 1405 by Salina April, RN Outcome: Adequate for Discharge 09/27/2020 1135 by Salina April, RN Outcome: Progressing   Problem: Nutrition: Goal: Adequate nutrition will be maintained 09/27/2020 1405 by Salina April, RN Outcome: Adequate for Discharge 09/27/2020 1135 by Salina April, RN Outcome: Progressing   Problem: Coping: Goal: Level of anxiety  will decrease 09/27/2020 1405 by Salina April, RN Outcome: Adequate for Discharge 09/27/2020 1135 by Salina April, RN Outcome: Progressing   Problem: Elimination: Goal: Will not experience complications related to bowel motility 09/27/2020 1405 by Salina April, RN Outcome: Adequate for Discharge 09/27/2020 1135 by Salina April, RN Outcome: Progressing Goal: Will not experience complications related to urinary retention 09/27/2020 1405 by Salina April, RN Outcome: Adequate for Discharge 09/27/2020 1135 by Salina April, RN Outcome: Progressing   Problem: Pain Managment: Goal: General experience of comfort will improve 09/27/2020 1405 by Salina April, RN Outcome: Adequate for Discharge 09/27/2020 1135 by Salina April, RN Outcome: Progressing   Problem: Safety: Goal: Ability to remain free from injury will improve 09/27/2020 1405 by Salina April, RN Outcome: Adequate for Discharge 09/27/2020 1135 by Salina April, RN Outcome: Progressing   Problem: Skin Integrity: Goal: Risk for impaired skin integrity will decrease 09/27/2020 1405 by Salina April, RN Outcome: Adequate for Discharge 09/27/2020 1135 by Salina April, RN Outcome: Progressing

## 2020-09-27 NOTE — Plan of Care (Signed)

## 2020-09-27 NOTE — Discharge Summary (Addendum)
[facility] Discharge Summary  Patient name: Rick Schwartz Medical record number: [id number] Date of birth: 12-14-1940 Age: 80 y.o. Gender: male Date of Admission: 09/20/2020  Date of Discharge: 09/27/20 Admitting Physician: Eulis Foster, MD  Primary Care Provider: Lurline Del, DO Consultants: Cardiology, Pulmonology  Indication for Hospitalization: Acute Hypoxia  Discharge Diagnoses/Problem List:  Cerebral palsy, HTN, foot ulcer, severe protein calorie malnutrition, DVT, prostate cancer, PAD, HLD, PE, and severe HFrEF.  Disposition: Able to be discharged home safely  Discharge Condition: Stable  Discharge Exam:  Temp:  [97.4 F (36.3 C)-98 F (36.7 C)] 98 F (36.7 C) (07/12 1151) Pulse Rate:  [76-96] 96 (07/12 1151) Resp:  [16-20] 16 (07/12 1151) BP: (93-103)/(49-77) 93/77 (07/12 1151) SpO2:  [93 %-100 %] 95 % (07/12 1151) Weight:  [43 kg] 43 kg (07/12 0542) Physical Exam: General: NAD Cardiovascular: RRR, Normal heart sounds Respiratory: CTAB,  Extremities: Mild tenderness of left heel, no findings of infection  Brief Hospital Course:  Rick Schwartz is a 80 y.o. male presenting with shortness of breath. Tachycardia, and tachypnea. Found to have PE and bilateral pleural effusions. PMHx is significant for cerebral palsy, HTN, foot ulcer, severe protein calorie malnutrition, DVT, prostate cancer, PAD, and HLD.  Acute Hypoxia  Bilateral pleural effusions, PE Presented with shortness of breath and hypoxia requiring supplemental O2 via Nasal Canula.  Found to have Pulmonary embolism and Chest CTA. Also found to have Bilateral Pleural Effusion on Chest X-Ray.  Thoracentesis performed with 900 mL of Transudative fluid.  Breathing improved following this and patient easily weaned down to room air.  Started on heprin drip, before being transitioned to Eliquis with 10 mg BID for 7 days followed by 5 mg BID to continue after.  Able to be  discharged home with recs for 24 hour supervision by family and Rick Schwartz PT/OT/Nursing.  Severe HFrEF ECHO performed and found severe systolic function.  Cardiology consulted and patient determined not to be surgical candidate given multiple comorbidities.  Started on Lasix 20 mg as well as Losartan and Metoprolol.    Left foot wound  Concern for osteomyelitis Open ulcerated wound on paX-Ray performed and showed minimal concern for osteomyelitis.  Wound care consulted.  Pain in heel improved by time of dischargge with supportive measures.  Severe protein calorie malnutrition Nutrition consult placed.  Diet optimized.  Patient had good oral intake throughout hospitalization.  Palliative care consulted Palliative Consulted given patient's multiple issues and newly diagnosed Heart Failure.  Patient and family elected to remain DNR but otherwise with full treatment for time being.  Plan for further [facility] discussion with Palliative outpatient.  Discharge recommendations Palliative care consulted udrig admission. Follow up with patient's goals, any advanced directives, etc. PT/OT - recommend outpatient referrals to PT and OT by PCP at follow up.  started on low dose losartan 25mg  daily. SBP runs fairly low, but consistently >100, so will try low dose metoprolol succinate. Recommend BMP at follow up  Eliquis increased to 10mg  BID for 7 days followed by 5mg  BID ongoing starting on 7/14. 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 Patient came in on 2.5mg  eliquis so this was not determined to be a failure.  Home potassium was continued on discharged. Recommend check BMP at follow up.  Can consider increasing Potassium supplementation.     Significant Procedures: ECHO ECHOCARDIOGRAM REPORT         Patient Name:   Rick Schwartz Date of Exam: 09/21/2020  Medical Rec #:  [id number]      Height:       67.0 in  Accession #:    [id number]     Weight:       110.0 lb  Date of Birth:   [birth year]      BSA:          1.569 m  Patient Age:    25 years       BP:           126/110 mmHg  Patient Gender: M              HR:           77 bpm.  Exam Location:  Inpatient   Procedure: 2D Echo, Color Doppler and Cardiac Doppler                           REPORT CONTAINS CRITICAL RESULT     Severely reduced LVEF, no known prior history. Findings communicated with  family                           medicine service at 11:12 AM.   Indications:    Pulmonary Embolus I26.09     History:        Patient has no prior history of Echocardiogram  examinations.                  Risk Factors:Hypertension and Dyslipidemia.     Sonographer:    Bernadene Person RDCS  Referring Phys: [id number] [place]     1. Left ventricular ejection fraction, by estimation, is <20%. The left  ventricle has severely decreased function. The left ventricle demonstrates  global hypokinesis. Left ventricular diastolic parameters are  indeterminate.   2. Right ventricular systolic function is moderately reduced. The right  ventricular size is normal.   3. Left atrial size was mildly dilated.   4. Right atrial size was mildly dilated.   5. Moderate pleural effusion in both left and right lateral regions.   6. The mitral valve is normal in structure. Mild mitral valve  regurgitation. No evidence of mitral stenosis.   7. The aortic valve is tricuspid. There is moderate calcification of the  aortic valve. Aortic valve regurgitation is not visualized. Mild to  moderate aortic valve sclerosis/calcification is present, without any  evidence of aortic stenosis.   8. The inferior vena cava is normal in size with <50% respiratory  variability, suggesting right atrial pressure of 8 mmHg.   Comparison(s): No prior Echocardiogram.   FINDINGS   Left Ventricle: Left ventricular ejection fraction, by estimation, is  <20%. The left ventricle has severely decreased function. The left  ventricle  demonstrates global hypokinesis. The left ventricular internal  cavity size was normal in size. There is no   left ventricular hypertrophy. Left ventricular diastolic parameters are  indeterminate.   Right Ventricle: The right ventricular size is normal. No increase in  right ventricular wall thickness. Right ventricular systolic function is  moderately reduced.   Left Atrium: Left atrial size was mildly dilated.   Right Atrium: Right atrial size was mildly dilated.   Pericardium: Trivial pericardial effusion is present.   Mitral Valve: The mitral valve is normal in structure. Mild mitral valve  regurgitation. No evidence of mitral valve stenosis.   Tricuspid Valve: The tricuspid valve is grossly normal. Tricuspid valve  regurgitation is trivial.   Aortic Valve: The aortic valve is tricuspid. There is moderate  calcification of the aortic valve. Aortic valve regurgitation is not  visualized. Mild to moderate aortic valve sclerosis/calcification is  present, without any evidence of aortic stenosis.   Pulmonic Valve: The pulmonic valve was grossly normal. Pulmonic valve  regurgitation is mild. No evidence of pulmonic stenosis.   Aorta: The aortic root, ascending aorta and aortic arch are all  structurally normal, with no evidence of dilitation or obstruction.   Venous: The inferior vena cava is normal in size with less than 50%  respiratory variability, suggesting right atrial pressure of 8 mmHg.   IAS/Shunts: The atrial septum is grossly normal.   Additional Comments: There is a moderate pleural effusion in both left and  right lateral regions.      LEFT VENTRICLE  PLAX 2D  LVIDd:         4.70 cm      Diastology  LVIDs:         4.30 cm      LV e' medial:    6.05 cm/s  LV PW:         0.90 cm      LV E/e' medial:  9.9  LV IVS:        0.80 cm      LV e' lateral:   10.50 cm/s  LVOT diam:     1.90 cm      LV E/e' lateral: 5.7  LV SV:         23  LV SV Index:   15  LVOT  Area:     2.84 cm     LV Volumes (MOD)  LV vol d, MOD A2C: 85.8 ml  LV vol d, MOD A4C: 117.0 ml  LV vol s, MOD A2C: 61.5 ml  LV vol s, MOD A4C: 75.8 ml  LV SV MOD A2C:     24.3 ml  LV SV MOD A4C:     117.0 ml  LV SV MOD BP:      37.1 ml   RIGHT VENTRICLE  RV S prime:     7.56 cm/s  TAPSE (M-mode): 0.8 cm   LEFT ATRIUM           Index       RIGHT ATRIUM           Index  LA diam:      4.40 cm 2.81 cm/m  RA Area:     13.60 cm  LA Vol (A2C): 37.9 ml 24.16 ml/m RA Volume:   33.30 ml  21.23 ml/m  LA Vol (A4C): 37.1 ml 23.65 ml/m   AORTIC VALVE  LVOT Vmax:   55.03 cm/s  LVOT Vmean:  32.867 cm/s  LVOT VTI:    0.080 m     AORTA  Ao Root diam: 2.60 cm  Ao Asc diam:  2.60 cm   MITRAL VALVE  MV Area (PHT): 2.91 cm    SHUNTS  MV Decel Time: 261 msec    Systemic VTI:  0.08 m  MV E velocity: 59.60 cm/s  Systemic Diam: 1.90 cm  MV A velocity: 35.30 cm/s  MV E/A ratio:  1.69   Buford Dresser MD  Electronically signed by Buford Dresser MD  Signature Date/Time: 09/21/2020/11:13:59 AM         Final      LOWER EXTREMITY DOPPLER STUDY   Patient Name:  Sueanne Margarita  Date of Exam:  09/22/2020  Medical Rec #: [id number]       Accession #:    [id number]  Date of Birth: 12-13-1940       Patient Gender: M  Patient Age:   80Y  Exam Location:  [facility]  Procedure:      VAS [place] ABI WITH/WO TBI  Referring Phys: [id number] MARGARET E PRAY    ---------------------------------------------------------------------------  -----     Indications: Ulceration, and peripheral artery disease.   High Risk Factors: Hypertension, hyperlipidemia.     Limitations: Today's exam was limited due to Right AKA, right upper  extremity               contracture.   Performing Technologist: Maudry Mayhew MHA, RVT, RDCS, RDMS      Examination Guidelines: A complete evaluation includes at minimum, Doppler  waveform signals and systolic blood pressure reading at the level of   bilateral  brachial, anterior tibial, and posterior tibial arteries, when vessel  segments  are accessible. Bilateral testing is considered an integral part of a  complete  examination. Photoelectric Plethysmograph (PPG) waveforms and toe systolic  pressure readings are included as required and additional duplex testing  as  needed. Limited examinations for reoccurring indications may be performed  as  noted.      ABI Findings:   +---------+------------------+-----+----------+-------+  Left     Lt Pressure (mmHg)IndexWaveform  Comment  +---------+------------------+-----+----------+-------+  Brachial 84                     triphasic          +---------+------------------+-----+----------+-------+  PTA                             absent             +---------+------------------+-----+----------+-------+  DP       30                0.36 monophasic         +---------+------------------+-----+----------+-------+  Great Toe                       Absent             +---------+------------------+-----+----------+-------+   +-------+-----------+-----------+------------+------------+  ABI/TBIToday's ABIToday's TBIPrevious ABIPrevious TBI  +-------+-----------+-----------+------------+------------+  Right  AKA                                             +-------+-----------+-----------+------------+------------+  Left   0.36       0.00                                 +-------+-----------+-----------+------------+------------+          Summary:  Right:   AKA.  Left: Resting left ankle-brachial index indicates severe left lower  extremity arterial disease. The left great toe flow is absent.       *See table(s) above for measurements and observations.       Electronically signed by Monica Martinez MD on 09/22/2020 at 5:43:42 PM.          Final      Significant Labs and Imaging:  Recent Labs  Lab 09/21/20 0750  09/22/20 0336 09/23/20 0814 09/24/20 0144 09/25/20  0241 09/26/20 0412  WBC 5.9 5.1 3.8*  --   --   --   HGB 12.9* 13.0 12.4* 12.5* 12.3* 12.3*  HCT 42.4 41.0 40.1 39.1 38.1* 39.1  PLT 216 164 207  --   --   --    Recent Labs  Lab 09/21/20 0750 09/21/20 1833 09/22/20 0336 09/23/20 0525 09/24/20 0144 09/25/20 0241 09/26/20 0412  NA 140  --  143 138 136 138 135  K 3.7  --  3.6 3.6 4.4 3.8 3.7  CL 112*  --  111 108 108 106 104  CO2 21*  --  23 24 25 25 26   GLUCOSE 124*  --  100* 86 98 91 91  BUN 12  --  11 11 11 13 11   CREATININE 0.52*  --  0.57* 0.54* 0.55* 0.48* 0.56*  CALCIUM 8.8*  --  9.0 8.9 9.0 9.1 8.7*  MG  --  1.9  --  1.7 2.0 1.9 1.8  ALKPHOS 52  --   --   --   --   --   --   AST 16  --   --   --   --   --   --   ALT 11  --   --   --   --   --   --   ALBUMIN 2.3*  --   --   --   --   --   --    CLINICAL DATA:  Sepsis   EXAM: CHEST - 2 VIEW   COMPARISON:  None.   FINDINGS: Shallow lung inflation. Normal cardiomediastinal contours. Right mid lung opacities, likely atelectasis. Mild bilateral interstitial opacity suggesting pulmonary vascular congestion without overt edema. Small right pleural effusion.   IMPRESSION: Shallow lung inflation with small right pleural effusion and right mid lung atelectasis.     Electronically Signed   By: Ulyses Jarred M.D.   On: 09/20/2020 19:03  EXAM: CT ANGIOGRAPHY CHEST WITH CONTRAST   TECHNIQUE: Multidetector CT imaging of the chest was performed using the standard protocol during bolus administration of intravenous contrast. Multiplanar CT image reconstructions and MIPs were obtained to evaluate the vascular anatomy.   CONTRAST:  59mL OMNIPAQUE IOHEXOL 350 MG/ML SOLN   COMPARISON:  Chest radiographs dated 09/20/2020. CT chest dated 10/01/2007.   FINDINGS: Cardiovascular: Satisfactory opacification of the bilateral pulmonary arteries to the lobar level. Suspected segmental/subsegmental filling defect  branches of the right lower lobe pulmonary artery (series 7/images 78 and 82). Overall clot burden is small. No findings of right heart strain.   Study is not tailored for evaluation of the thoracic aorta. No evidence of thoracic aortic aneurysm. Atherosclerotic calcifications of the aortic arch.   The heart is normal in size.  No pericardial effusion.   Three vessel coronary atherosclerosis.   Mediastinum/Nodes: No suspicious mediastinal lymphadenopathy.   Visualized thyroid is unremarkable.   Lungs/Pleura: Moderate to large right pleural effusion. Small to moderate left pleural effusion.   Associated compressive atelectasis in the right upper, middle, and right lower lobe. Compressive atelectasis in the left lower lobe.   No suspicious pulmonary nodules, noting motion degradation.   No pneumothorax.   Upper Abdomen: Visualized upper abdomen is notable for low-density thickening of the left adrenal gland (series 7/image 142), suggesting a 2.0 cm left adrenal adenoma. Vascular calcifications.   Musculoskeletal: Mild degenerative changes of the visualized thoracolumbar spine. No focal osseous lesions.   Review of the MIP images confirms the above findings.  IMPRESSION: Suspected segmental/subsegmental pulmonary embolus within branches of the right lower lobe pulmonary artery, equivocal/indeterminate (given the limitations of the current study) but favored to reflect a true finding. Overall clot burden is small. No evidence of right heart strain.   Critical Value/emergent results were called by telephone at the time of interpretation on 09/20/2020 at 11:20 pm to provider Dr. Dina Rich, who verbally acknowledged these results.   Moderate to large bilateral pleural effusions, right greater than left. Associated compressive atelectasis in the lungs bilaterally.   Aortic Atherosclerosis (ICD10-I70.0).     Electronically Signed   By: Julian Hy M.D.   On: 09/20/2020  23:22  EXAM: CHEST  1 VIEW   COMPARISON:  Chest x-ray 09/20/2020.   FINDINGS: Right-sided pleural effusion has decreased substantially in is now small. Persistent moderate left pleural effusion. No definite pneumothorax. Bibasilar opacities favored to reflect areas of passive subsegmental atelectasis. No evidence of pulmonary edema. Heart size is normal. Mediastinal contours are distorted by patient positioning. Atherosclerotic calcifications in the thoracic aorta.   IMPRESSION: 1. Decreased small right-sided pleural effusion following thoracentesis. No postprocedural pneumothorax. 2. Persistent moderate left pleural effusion. 3. Bibasilar areas of subsegmental atelectasis. 4. Aortic atherosclerosis.     Electronically Signed   By: Vinnie Langton M.D.   On: 09/21/2020 08:02    Results/Tests Pending at Time of Discharge: None  Discharge Medications:  Allergies as of 09/27/2020   No Known Allergies      Medication List     STOP taking these medications    aspirin EC 81 MG tablet       TAKE these medications    (feeding supplement) PROSource Plus liquid Take 30 mLs by mouth 3 (three) times daily between meals.   acetaminophen 325 MG tablet Commonly known as: TYLENOL Take 2 tablets (650 mg total) by mouth every 6 (six) hours.   apixaban 5 MG Tabs tablet Commonly known as: ELIQUIS Take 2 tablets (10 mg total) by mouth 2 (two) times daily for 2 days. Last day will be 7/13 What changed:  how much to take additional instructions   Eliquis 5 MG Tabs tablet Generic drug: apixaban Take 2 tablets (10mg  total) by mouth twice daily for 2 days, then beginning 7/14 take 1 tablet (5 mg total) by mouth 2 (two) times daily. Start taking on: September 29, 2020 What changed:  medication strength how much to take additional instructions   ascorbic acid 1000 MG tablet Commonly known as: VITAMIN C Take 1 tablet (1,000 mg total) by mouth daily.   furosemide 20 MG  tablet Commonly known as: LASIX Take 1 tablet (20 mg total) by mouth daily.   Gerhardt's butt cream Crea Apply 1 application topically 3 (three) times daily.   losartan 25 MG tablet Commonly known as: COZAAR Take 1 tablet (25 mg total) by mouth daily.   metoprolol succinate 25 MG 24 hr tablet Commonly known as: TOPROL-XL Take 0.5 tablets (12.5 mg total) by mouth daily.   potassium chloride 10 MEQ tablet Commonly known as: KLOR-CON Take 1 tablet (10 mEq total) by mouth daily.   rosuvastatin 20 MG tablet Commonly known as: Crestor Take 1 tablet (20 mg total) by mouth daily.               Discharge Care Instructions  (From admission, onward)           Start     Ordered   09/27/20 0000  If the dressing is still on your incision site  when you go home, remove it on the third day after your surgery date. Remove dressing if it begins to fall off, or if it is dirty or damaged before the third day.        09/27/20 1020            Discharge Instructions: Please refer to Patient Instructions section of EMR for full details.  Patient was counseled important signs and symptoms that should prompt return to medical care, changes in medications, dietary instructions, activity restrictions, and follow up appointments.   Follow-Up Appointments:  Follow-up Information     Autry-Lott, Naaman Plummer, DO. Go on 10/03/2020.   Specialty: Family Medicine Why: @3 :50pm. Please arrive by 3:30pm. This is your hospital follow up appointment with your primary care clinic. Contact information: [address] [address] [place]. Call.   Specialty: Family Medicine Why: If you believe you need to be seen in the clinic sooner than Monday, 7/18, please call and schedule a same-day or next-day appointment. Contact information: [address] [id number] [place] Follow up.   Why: HHRN, HHOT, HHPT, SW, HHAIDE                Delora Fuel, MD 09/28/2020, 12:02 AM PGY-1, [place]

## 2020-09-27 NOTE — TOC Transition Note (Signed)
Transition of Care [facility]) - CM/SW Discharge Note   Patient Details  Name: JORRYN CASAGRANDE MRN: [id number] Date of Birth: 1940-04-27  Transition of Care [facility]) CM/SW Contact:  Zenon Mayo, RN Phone Number: 09/27/2020, 11:46 AM   Clinical Narrative:    Patient is for dc today, he lives with his sister, Mardene Celeste and her spouse.  Mardene Celeste states they are there 24 hrs with patient.  She states he is active with [facility] for [facility] services and would like to continue with them.  NCM confirmed this with Amy with [facility].  Orders in for [facility], [place], [place], [place], and SW.  Mardene Celeste states that patient will transport home via car.  NCM asked if he needs an ambulance. She states no they will pick him up in the car.     Final next level of care: [place] Barriers to Discharge: No Barriers Identified   Patient Goals and CMS Choice Patient states their goals for this hospitalization and ongoing recovery are:: return home with [facility] CMS Medicare.gov Compare Post Acute Care list provided to:: Patient Represenative (must comment) Choice offered to / list presented to : Adult Children  Discharge Placement                       Discharge Plan and Services                  DME Agency: NA       HH Arranged: RN, PT, OT, Nurse's Aide, Social Work [employer] Agency: [place] Date [place]: 09/27/20 Time [place]: 2130 Representative spoke with at [place]: Amy  Social Determinants of Health ([place]) Interventions     Readmission Risk Interventions No flowsheet data found.

## 2020-09-29 ENCOUNTER — Telehealth: Payer: Self-pay

## 2020-09-29 NOTE — Telephone Encounter (Signed)
Spoke with patient's [name] and scheduled an in-person Palliative Consult for 10/27/20 @ 9AM.   COVID screening was negative. Small dog in the home will put away before NP arrives. Patient lives with sister.  Consent obtained; updated Outlook/Netsmart/Team List and Epic.   Family is aware they may be receiving a call from NP the day before or day of to confirm appointment.

## 2020-09-29 NOTE — Telephone Encounter (Signed)
Sent to PCP. Salvatore Marvel, CMA

## 2020-09-30 ENCOUNTER — Other Ambulatory Visit (HOSPITAL_COMMUNITY): Payer: Self-pay

## 2020-10-03 ENCOUNTER — Other Ambulatory Visit: Payer: Self-pay

## 2020-10-03 ENCOUNTER — Encounter: Payer: Self-pay | Admitting: Family Medicine

## 2020-10-03 ENCOUNTER — Ambulatory Visit (INDEPENDENT_AMBULATORY_CARE_PROVIDER_SITE_OTHER): Payer: Medicare Other | Admitting: Family Medicine

## 2020-10-03 VITALS — BP 117/70 | HR 92

## 2020-10-03 DIAGNOSIS — E876 Hypokalemia: Secondary | ICD-10-CM | POA: Diagnosis not present

## 2020-10-03 DIAGNOSIS — Z09 Encounter for follow-up examination after completed treatment for conditions other than malignant neoplasm: Secondary | ICD-10-CM | POA: Diagnosis not present

## 2020-10-03 MED ORDER — POTASSIUM CHLORIDE CRYS ER 10 MEQ PO TBCR: 10.0000 meq | EXTENDED_RELEASE_TABLET | Freq: Every day | ORAL | 0 refills | Status: DC

## 2020-10-03 MED ORDER — POTASSIUM CHLORIDE CRYS ER 10 MEQ PO TBCR
10.0000 meq | EXTENDED_RELEASE_TABLET | Freq: Every day | ORAL | 0 refills | Status: DC
Start: 2020-10-03 — End: 2020-11-07

## 2020-10-03 NOTE — Progress Notes (Deleted)
    SUBJECTIVE:   CHIEF COMPLAINT / HPI:   ***  PERTINENT  PMH / PSH: ***  OBJECTIVE:   BP 117/70   Pulse 92   SpO2 97%   ***  ASSESSMENT/PLAN:   No problem-specific Assessment & Plan notes found for this encounter.     Gerlene Fee, [place]

## 2020-10-03 NOTE — Progress Notes (Signed)
    SUBJECTIVE:   CHIEF COMPLAINT / HPI:   [name] is a 80 yo M who presents for the following:  Hospital follow up Admitted to the hospital for shortness of breath 7/5 found to have pleural effusions and pulmonary embolism.  Also heart function significantly declined.  Started on several different medications during this hospitalization.  Wife admits that he is doing much better.  Physical therapy to work with him 2 days a week and occupational therapy comes to work with him 2 days a week as well.  There is a nurse that comes to the home once a month to also take care of left foot ulcer.  She endorses he is currently taking 5 mg along with losartan and metoprolol.  No concerns today.  PERTINENT  PMH / PSH: Cerebral palsy, HTN, foot ulcer, HLD  OBJECTIVE:   BP 117/70   Pulse 92   SpO2 97%   General: Appears well, no acute distress. Age appropriate. Smiling during exam. Cardiac: RRR, normal heart sounds, no murmurs Respiratory: CTAB, normal effort Extremities: Right AKA, UE contractures. No edema or cyanosis. Skin: Warm and dry. Left foot wound wrapped with dressing.  Neuro: alert and oriented x4 Psych: normal affect  ASSESSMENT/PLAN:   No problem-specific Assessment & Plan notes found for this encounter. 1. Hospital discharge follow-up Patient admitted to hospital 7/5 for acute hypoxia found to have PE in bilateral pleural effusions.  Thoracentesis performed yielding 900 mL of transudative fluid.  Breathing subsequently improved and was started on Eliquis and continues on 5 mg twice daily.  Also repeated echo found to have severely reduced EF in which cardiology was consulted.  Not a surgical candidate at this time was started on Lasix, losartan and metoprolol in which he endorses he is continuing.  Also with left foot wound that is covered today but low suspicion for osteomyelitis during hospitalization.  Patient was discharged 7/12.  Discussed with wife to continue PT/OT as well  as wound care nurse.  And follow-up with primary care doctor as necessary.  2. Hypokalemia Potassium supplementation continued upon discharge we will obtain BMP today and consider if potassium supplementation needs to be continued or increased. - potassium chloride (KLOR-CON) 10 MEQ tablet; Take 1 tablet (10 mEq total) by mouth daily.  Dispense: 30 tablet; Refill: 0 - Basic Metabolic Panel    Rick Schwartz, [place]

## 2020-10-03 NOTE — Patient Instructions (Addendum)
Thank you for coming in. I am glad you are feeling better.   Today we will check your kidney function and your potassium levels. I will notify you of results when available.  Please continue the PT/OT to regain strength.   Follow up with your primary care doctor as needed and the heart failure team as instructed.   Dr. Janus Molder

## 2020-10-04 LAB — BASIC METABOLIC PANEL
BUN/Creatinine Ratio: 27 — ABNORMAL HIGH (ref 10–24)
BUN: 14 mg/dL (ref 8–27)
CO2: 19 mmol/L — ABNORMAL LOW (ref 20–29)
Calcium: 9.3 mg/dL (ref 8.6–10.2)
Chloride: 107 mmol/L — ABNORMAL HIGH (ref 96–106)
Creatinine, Ser: 0.52 mg/dL — ABNORMAL LOW (ref 0.76–1.27)
Glucose: 130 mg/dL — ABNORMAL HIGH (ref 65–99)
Potassium: 4.5 mmol/L (ref 3.5–5.2)
Sodium: 140 mmol/L (ref 134–144)
eGFR: 103 mL/min/{1.73_m2} (ref >59)

## 2020-10-06 ENCOUNTER — Encounter: Payer: Self-pay | Admitting: Family Medicine

## 2020-10-11 ENCOUNTER — Telehealth: Payer: Self-pay

## 2020-10-11 NOTE — Telephone Encounter (Signed)
[place] OT calls nurse line requesting verbal orders for [facility] OT as follows.   2x a week for 3 weeks   Verbal order given per [facility] protocol.

## 2020-10-20 ENCOUNTER — Other Ambulatory Visit: Payer: Self-pay

## 2020-10-20 ENCOUNTER — Other Ambulatory Visit: Payer: Self-pay | Admitting: Family Medicine

## 2020-10-20 MED ORDER — FUROSEMIDE 20 MG PO TABS: 20.0000 mg | ORAL_TABLET | Freq: Every day | ORAL | 1 refills | Status: DC

## 2020-10-20 MED ORDER — APIXABAN 5 MG PO TABS: 5.0000 mg | ORAL_TABLET | Freq: Two times a day (BID) | ORAL | 1 refills | Status: DC

## 2020-10-20 MED ORDER — LOSARTAN POTASSIUM 25 MG PO TABS: 25.0000 mg | ORAL_TABLET | Freq: Every day | ORAL | 1 refills | Status: DC

## 2020-10-20 MED ORDER — ROSUVASTATIN CALCIUM 20 MG PO TABS: 20.0000 mg | ORAL_TABLET | Freq: Every day | ORAL | 0 refills | Status: DC

## 2020-10-20 MED ORDER — METOPROLOL SUCCINATE ER 25 MG PO TB24: 12.5000 mg | ORAL_TABLET | Freq: Every day | ORAL | 1 refills | Status: DC

## 2020-10-27 ENCOUNTER — Other Ambulatory Visit: Payer: Medicare Other | Admitting: Hospice

## 2020-10-27 ENCOUNTER — Other Ambulatory Visit: Payer: Self-pay

## 2020-10-27 DIAGNOSIS — I5021 Acute systolic (congestive) heart failure: Secondary | ICD-10-CM

## 2020-10-27 DIAGNOSIS — E43 Unspecified severe protein-calorie malnutrition: Secondary | ICD-10-CM

## 2020-10-27 DIAGNOSIS — R531 Weakness: Secondary | ICD-10-CM

## 2020-10-27 DIAGNOSIS — Z515 Encounter for palliative care: Secondary | ICD-10-CM

## 2020-10-27 NOTE — Progress Notes (Signed)
Therapist, nutritional Palliative Care Consult Note Telephone: [phone]  Fax: [phone]  PATIENT NAME: Rick Schwartz [address] [place] [address] [phone] (home)  DOB: 1941-01-08 MRN: [id number]  PRIMARY CARE PROVIDER:    Jackelyn Poling, DO,  [address] [place] [address] [phone]  REFERRING PROVIDER:   Jackelyn Poling, DO [address] [place],  [place] [address] [phone]  RESPONSIBLE PARTY:   Leitha Schuller Information     Name Relation Home Work Mobile   Rick Schwartz Sister [phone]     Rick, Schwartz [phone]     Rick Schwartz   [phone]        I met face to face with patient and family at home. Palliative Care was asked to follow this patient by consultation request of  Rick Poling, DO to address advance care planning, complex medical decision making and goals of care clarification. Rick Schwartz was home with patient during visit. This is the initial visit.    ASSESSMENT AND / RECOMMENDATIONS:   Advance Care Planning: Our advance care planning conversation included a discussion about:    The value and importance of advance care planning  Difference between Hospice and Palliative care Exploration of goals of care in the event of a sudden injury or illness  Identification and preparation of a healthcare agent  Review and updating or creation of an  advance directive document . Decision not to resuscitate or to de-escalate disease focused treatments due to poor prognosis.  CODE STATUS:Patient is a Do Not Resuscitate. NP signed DNR form for patient to keep at home.   Goals of Care: Goals include to maximize quality of life and symptom management  I spent  46 minutes providing this initial consultation. More than 50% of the time in this consultation was spent on counseling patient and coordinating  communication. --------------------------------------------------------------------------------------------------------------------------------------  Symptom Management/Plan: Weakness: PT/OT is ongoing; Patient is R AKA, non ambulatory, 2 persons max assist for transfers. CHF: Newly diagnosed during last hospitalization. Continue Lasix as ordered. Education provided on reduced salt intake, elevation of extremities. Routine CBC BMP Severe protein caloric malnutrition: Sister reports patient has good oral intake. Offer 4-6 small meals a day, provide assistance as needed to ensure adequate oral intake. Take Ensure BID. ST consult as needed.  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 HTN Managed with Losartan, Metoprolol. Sister plans to go and pick up Metoprolol.   Follow up: Palliative care will continue to follow for complex medical decision making, advance care planning, and clarification of goals. Return 6 weeks or prn.Encouraged to call provider sooner with any concerns.   Family /Caregiver/Community Supports: Patient lives at home with his sister/family. Sister is involved in patient's care. Strong family support system identified.   HOSPICE ELIGIBILITY/DIAGNOSIS: TBD  Chief Complaint: Initial Palliative care visit  HISTORY OF PRESENT ILLNESS:  Rick Schwartz is a 80 y.o. year old male  with multiple medical conditions including weakness, worsened in the last month following hospitalization - Hospitalization 7/5 - 09/27/2020 for shortness of breath/acute hypoxia. Epic records indicates patient was  Found to have Pulmonary embolism; also found to have Bilateral Pleural Effusion on Chest X-Ray.  Thoracentesis performed with 900 mL of Transudative fluid. He was further treated with heparin drip and transitioned to Eliquis, stabilized and discharged home. Weakness impairs his independence, he is requiring max assist for ADLs, worse during ADLs; ongoing PT/OT is helpful. Cerebral palsy,  HTN, foot ulcer, severe protein calorie malnutrition, DVT, prostate  cancer, PAD, Right AKA HLD, PE, and severe HFrEF. History obtained from review of EMR, discussion with primary team, caregiver, family and/or Rick Schwartz.  Review and summarization of Epic records shows history from other than patient. Rest of 10 point ROS asked and negative.     Review of lab tests/diagnostics   Results for Rick, Schwartz (MRN [id number]) as of 10/27/2020 10:14  Ref. Range 10/03/2020 16:59  Sodium Latest Ref Range: 134 - 144 mmol/L 140  Potassium Latest Ref Range: 3.5 - 5.2 mmol/L 4.5  Chloride Latest Ref Range: 96 - 106 mmol/L 107 (H)  CO2 Latest Ref Range: 20 - 29 mmol/L 19 (L)  Glucose Latest Ref Range: 65 - 99 mg/dL 813 (H)  BUN Latest Ref Range: 8 - 27 mg/dL 14  Creatinine Latest Ref Range: 0.76 - 1.27 mg/dL 1.65 (L)  Calcium Latest Ref Range: 8.6 - 10.2 mg/dL 9.3  BUN/Creatinine Ratio Latest Ref Range: 10 - 24  27 (H)  eGFR Latest Ref Range: >59 mL/min/1.73 103  Results for Rick, Schwartz (MRN [id number]) as of 10/27/2020 10:14  Ref. Range 09/26/2020 04:12  Hemoglobin Latest Ref Range: 13.0 - 17.0 g/dL 80.8 (L)  HCT Latest Ref Range: 39.0 - 52.0 % 39.1    ROS General: NAD EYES: denies vision changes ENMT: denies dysphagia Cardiovascular: denies chest pain/discomfort Pulmonary: denies cough, denies SOB Abdomen: endorses good appetite, denies constipation/diarrhea GU: denies dysuria, urinary frequency MSK:  endorses weakness,  no falls reported Skin: denies rashes or wounds Neurological: denies pain, denies insomnia Psych: Endorses positive mood Heme/lymph/immuno: denies bruises, abnormal bleeding  Physical Exam: Height/Weight: 5 feet 5 inches/110Ibs down from 136 Ibs 9 months ago Constitutional: NAD General: Well groomed, cooperative EYES: anicteric sclera, lids intact, no discharge  ENMT: Moist mucous membrane CV: S1 S2, RRR, no LE edema Pulmonary: LCTA, no increased work of  breathing, no cough, Abdomen: active BS + 4 quadrants, soft and non tender GU: no suprapubic tenderness MSK: weakness, sarcopenia, R AKA, R hand contracture Skin: warm and dry, no rashes or wounds on visible skin Neuro:  weakness, otherwise non focal Psych: non-anxious affect Hem/lymph/immuno: no widespread bruising   PAST MEDICAL HISTORY:  Active Ambulatory Problems    Diagnosis Date Noted   PROSTATE CANCER 09/18/2007   GLUCOSE INTOLERANCE 05/16/2006   HYPERCHOLESTEROLEMIA 05/16/2006   Infantile cerebral palsy (HCC) 05/16/2006   HYPERTENSION, BENIGN SYSTEMIC 05/16/2006   BPH 05/16/2006   HALLUX RIGIDUS, ACQUIRED 04/11/2010   Cough 01/21/2012   Prediabetes 07/20/2016   Physical deconditioning 05/06/2020   Focal motor deficit 07/07/2020   Gangrene of right foot (HCC) 07/17/2020   Foot ulcer with necrosis of muscle, right (HCC) 07/18/2020   Severe sepsis (HCC) 07/18/2020   Lactic acidosis 07/18/2020   Hypernatremia 07/18/2020   Hypercalcemia 07/18/2020   Protein-calorie malnutrition (HCC)    SBO (small bowel obstruction) (HCC)    Inanition (HCC)    Right pulmonary embolus (HCC) 09/21/2020   Shortness of breath    Pleural effusion, bilateral    Pulmonary embolus (HCC) 09/21/2020   Pressure injury of skin 09/22/2020   Protein-calorie malnutrition, severe 09/22/2020   Dementia without behavioral disturbance (HCC)    Chronic HFrEF (heart failure with reduced ejection fraction) (HCC)    Resolved Ambulatory Problems    Diagnosis Date Noted   LIPOMA, FACE 05/02/2007   HYPOKALEMIA 05/16/2006   PEDAL EDEMA 01/02/2007   CERUMEN IMPACTION, BILATERAL 04/11/2010   Laceration of skin of face 09/12/2010   Healthcare maintenance 03/09/2011  Abnormal colonoscopy 12/27/2013   Gait instability 01/25/2014   Toe ulcer, right ([place]) 04/26/2015   Leg edema 10/10/2018   Past Medical History:  Diagnosis Date   Cancer ([place])    Cerebral palsy ([place])    Hyperlipidemia    Hypertension     Peripheral arterial disease ([place])     SOCIAL HX:  Social History   Tobacco Use   Smoking status: Former    Packs/day: 0.25    Years: 35.00    Pack years: 8.75    Types: Cigarettes    Quit date: 10/12/1994    Years since quitting: 26.0   Smokeless tobacco: Never  Substance Use Topics   Alcohol use: Yes    Comment: Occasional beer and gin     FAMILY HX:  Family History  Problem Relation Age of Onset   Diabetes Mother    Hypertension Mother    Hypertension Father    Diabetes Sister    Hypertension Sister    Diabetes Brother    Hypertension Brother    Diabetes Sister    Thyroid disease Sister    Sudden death Brother       ALLERGIES: No Known Allergies    PERTINENT MEDICATIONS:  Outpatient Encounter Medications as of 10/27/2020  Medication Sig   acetaminophen (TYLENOL) 325 MG tablet Take 2 tablets (650 mg total) by mouth every 6 (six) hours.   apixaban (ELIQUIS) 5 MG TABS tablet Take 2 tablets (10 mg total) by mouth 2 (two) times daily for 2 days. Last day will be 7/13   apixaban (ELIQUIS) 5 MG TABS tablet Take 2 tablets ($RemoveBe'10mg'DhLgXPhtE$  total) by mouth twice daily for 2 days, then beginning 7/14 take 1 tablet (5 mg total) by mouth 2 (two) times daily.   ascorbic acid (VITAMIN C) 1000 MG tablet Take 1 tablet (1,000 mg total) by mouth daily.   furosemide (LASIX) 20 MG tablet Take 1 tablet (20 mg total) by mouth daily.   losartan (COZAAR) 25 MG tablet Take 1 tablet (25 mg total) by mouth daily.   metoprolol succinate (TOPROL-XL) 25 MG 24 hr tablet Take 0.5 tablets (12.5 mg total) by mouth daily.   Nutritional Supplements (,FEEDING SUPPLEMENT, PROSOURCE PLUS) liquid Take 30 mLs by mouth 3 (three) times daily between meals.   Nystatin (GERHARDT'S BUTT CREAM) CREA Apply 1 application topically 3 (three) times daily.   potassium chloride (KLOR-CON) 10 MEQ tablet Take 1 tablet (10 mEq total) by mouth daily.   rosuvastatin (CRESTOR) 20 MG tablet Take 1 tablet (20 mg total) by mouth daily.    No facility-administered encounter medications on file as of 10/27/2020.     Thank you for the opportunity to participate in the care of Mr. Grandville Silos.  The palliative care team will continue to follow. Please call our office at [phone] if we can be of additional assistance.   Note: Portions of this note were generated with Lobbyist. Dictation errors may occur despite best attempts at proofreading.  Teodoro Spray, NP

## 2020-11-07 ENCOUNTER — Other Ambulatory Visit: Payer: Self-pay

## 2020-11-07 ENCOUNTER — Other Ambulatory Visit: Payer: Self-pay | Admitting: Family Medicine

## 2020-11-07 DIAGNOSIS — E876 Hypokalemia: Secondary | ICD-10-CM

## 2020-11-07 MED ORDER — POTASSIUM CHLORIDE CRYS ER 10 MEQ PO TBCR
10.0000 meq | EXTENDED_RELEASE_TABLET | Freq: Every day | ORAL | 0 refills | Status: DC
Start: 2020-11-07 — End: 2020-11-23

## 2020-11-22 ENCOUNTER — Encounter (HOSPITAL_COMMUNITY): Payer: Self-pay | Admitting: Emergency Medicine

## 2020-11-22 ENCOUNTER — Emergency Department (HOSPITAL_COMMUNITY): Payer: Medicare Other

## 2020-11-22 ENCOUNTER — Other Ambulatory Visit: Payer: Self-pay

## 2020-11-22 ENCOUNTER — Inpatient Hospital Stay (HOSPITAL_COMMUNITY)
Admission: EM | Admit: 2020-11-22 | Discharge: 2020-11-25 | DRG: 177 | Disposition: A | Discharge status: Home / Self Care | Payer: Medicare Other | Attending: Family Medicine | Admitting: Family Medicine

## 2020-11-22 DIAGNOSIS — U071 COVID-19: Secondary | ICD-10-CM

## 2020-11-22 DIAGNOSIS — J1282 Pneumonia due to coronavirus disease 2019: Secondary | ICD-10-CM | POA: Diagnosis present

## 2020-11-22 DIAGNOSIS — Z79899 Other long term (current) drug therapy: Secondary | ICD-10-CM

## 2020-11-22 DIAGNOSIS — E876 Hypokalemia: Secondary | ICD-10-CM | POA: Diagnosis present

## 2020-11-22 DIAGNOSIS — R0600 Dyspnea, unspecified: Secondary | ICD-10-CM

## 2020-11-22 DIAGNOSIS — Z8249 Family history of ischemic heart disease and other diseases of the circulatory system: Secondary | ICD-10-CM

## 2020-11-22 DIAGNOSIS — Z86718 Personal history of other venous thrombosis and embolism: Secondary | ICD-10-CM

## 2020-11-22 DIAGNOSIS — Z8546 Personal history of malignant neoplasm of prostate: Secondary | ICD-10-CM

## 2020-11-22 DIAGNOSIS — R34 Anuria and oliguria: Secondary | ICD-10-CM | POA: Diagnosis present

## 2020-11-22 DIAGNOSIS — E7800 Pure hypercholesterolemia, unspecified: Secondary | ICD-10-CM | POA: Diagnosis present

## 2020-11-22 DIAGNOSIS — R64 Cachexia: Secondary | ICD-10-CM | POA: Diagnosis present

## 2020-11-22 DIAGNOSIS — N400 Enlarged prostate without lower urinary tract symptoms: Secondary | ICD-10-CM | POA: Diagnosis present

## 2020-11-22 DIAGNOSIS — Z681 Body mass index (BMI) 19 or less, adult: Secondary | ICD-10-CM

## 2020-11-22 DIAGNOSIS — I739 Peripheral vascular disease, unspecified: Secondary | ICD-10-CM | POA: Diagnosis present

## 2020-11-22 DIAGNOSIS — Z923 Personal history of irradiation: Secondary | ICD-10-CM

## 2020-11-22 DIAGNOSIS — E43 Unspecified severe protein-calorie malnutrition: Secondary | ICD-10-CM | POA: Diagnosis present

## 2020-11-22 DIAGNOSIS — Z66 Do not resuscitate: Secondary | ICD-10-CM | POA: Diagnosis present

## 2020-11-22 DIAGNOSIS — I110 Hypertensive heart disease with heart failure: Secondary | ICD-10-CM | POA: Diagnosis present

## 2020-11-22 DIAGNOSIS — I5022 Chronic systolic (congestive) heart failure: Secondary | ICD-10-CM | POA: Diagnosis present

## 2020-11-22 DIAGNOSIS — L97529 Non-pressure chronic ulcer of other part of left foot with unspecified severity: Secondary | ICD-10-CM | POA: Diagnosis present

## 2020-11-22 DIAGNOSIS — Z833 Family history of diabetes mellitus: Secondary | ICD-10-CM

## 2020-11-22 DIAGNOSIS — Z7901 Long term (current) use of anticoagulants: Secondary | ICD-10-CM

## 2020-11-22 DIAGNOSIS — F0390 Unspecified dementia without behavioral disturbance: Secondary | ICD-10-CM | POA: Diagnosis present

## 2020-11-22 DIAGNOSIS — Z89611 Acquired absence of right leg above knee: Secondary | ICD-10-CM

## 2020-11-22 DIAGNOSIS — J189 Pneumonia, unspecified organism: Secondary | ICD-10-CM

## 2020-11-22 DIAGNOSIS — J9601 Acute respiratory failure with hypoxia: Secondary | ICD-10-CM | POA: Diagnosis present

## 2020-11-22 DIAGNOSIS — Z86711 Personal history of pulmonary embolism: Secondary | ICD-10-CM

## 2020-11-22 DIAGNOSIS — G809 Cerebral palsy, unspecified: Secondary | ICD-10-CM | POA: Diagnosis present

## 2020-11-22 DIAGNOSIS — Z87891 Personal history of nicotine dependence: Secondary | ICD-10-CM

## 2020-11-22 HISTORY — DX: COVID-19: U07.1

## 2020-11-22 LAB — CBC WITH DIFFERENTIAL/PLATELET
Abs Immature Granulocytes: 0.02 10*3/uL (ref 0.00–0.07)
Basophils Absolute: 0 10*3/uL (ref 0.0–0.1)
Basophils Relative: 1 %
Eosinophils Absolute: 0.2 10*3/uL (ref 0.0–0.5)
Eosinophils Relative: 3 %
HCT: 49.4 % (ref 39.0–52.0)
Hemoglobin: 15.7 g/dL (ref 13.0–17.0)
Immature Granulocytes: 0 %
Lymphocytes Relative: 12 %
Lymphs Abs: 0.8 10*3/uL (ref 0.7–4.0)
MCH: 28.8 pg (ref 26.0–34.0)
MCHC: 31.8 g/dL (ref 30.0–36.0)
MCV: 90.6 fL (ref 80.0–100.0)
Monocytes Absolute: 0.7 10*3/uL (ref 0.1–1.0)
Monocytes Relative: 11 %
Neutro Abs: 4.5 10*3/uL (ref 1.7–7.7)
Neutrophils Relative %: 73 %
Platelets: 225 10*3/uL (ref 150–400)
RBC: 5.45 MIL/uL (ref 4.22–5.81)
RDW: 17 % — ABNORMAL HIGH (ref 11.5–15.5)
WBC: 6.2 10*3/uL (ref 4.0–10.5)
nRBC: 0 % (ref 0.0–0.2)

## 2020-11-22 LAB — BASIC METABOLIC PANEL
Anion gap: 10 (ref 5–15)
BUN: 9 mg/dL (ref 8–23)
CO2: 20 mmol/L — ABNORMAL LOW (ref 22–32)
Calcium: 9.3 mg/dL (ref 8.9–10.3)
Chloride: 110 mmol/L (ref 98–111)
Creatinine, Ser: 0.7 mg/dL (ref 0.61–1.24)
GFR, Estimated: >60 mL/min (ref >60)
Glucose, Bld: 117 mg/dL — ABNORMAL HIGH (ref 70–99)
Potassium: 4 mmol/L (ref 3.5–5.1)
Sodium: 140 mmol/L (ref 135–145)

## 2020-11-22 LAB — RESP PANEL BY RT-PCR (FLU A&B, COVID) ARPGX2
Influenza A by PCR: NEGATIVE
Influenza B by PCR: NEGATIVE
SARS Coronavirus 2 by RT PCR: POSITIVE — AB

## 2020-11-22 LAB — I-STAT ARTERIAL BLOOD GAS, ED
Acid-Base Excess: 1 mmol/L (ref 0.0–2.0)
Bicarbonate: 25.1 mmol/L (ref 20.0–28.0)
Calcium, Ion: 1.25 mmol/L (ref 1.15–1.40)
HCT: 49 % (ref 39.0–52.0)
Hemoglobin: 16.7 g/dL (ref 13.0–17.0)
O2 Saturation: 100 %
Patient temperature: 97.3
Potassium: 3.4 mmol/L — ABNORMAL LOW (ref 3.5–5.1)
Sodium: 145 mmol/L (ref 135–145)
TCO2: 26 mmol/L (ref 22–32)
pCO2 arterial: 35.2 mmHg (ref 32.0–48.0)
pH, Arterial: 7.458 — ABNORMAL HIGH (ref 7.350–7.450)
pO2, Arterial: 171 mmHg — ABNORMAL HIGH (ref 83.0–108.0)

## 2020-11-22 LAB — BRAIN NATRIURETIC PEPTIDE: B Natriuretic Peptide: 1072.6 pg/mL — ABNORMAL HIGH (ref 0.0–100.0)

## 2020-11-22 LAB — TROPONIN I (HIGH SENSITIVITY): Troponin I (High Sensitivity): 21 ng/L — ABNORMAL HIGH (ref <18)

## 2020-11-22 MED ORDER — METOPROLOL SUCCINATE ER 25 MG PO TB24
12.5000 mg | ORAL_TABLET | Freq: Every day | ORAL | Status: DC
  Administered 2020-11-25: 12.5 mg via ORAL
  Filled 2020-11-22 (×2): qty 1

## 2020-11-22 MED ORDER — DEXAMETHASONE SODIUM PHOSPHATE 10 MG/ML IJ SOLN
6.0000 mg | INTRAMUSCULAR | Status: DC
  Administered 2020-11-22 – 2020-11-24 (×3): 6 mg via INTRAVENOUS
  Filled 2020-11-22 (×3): qty 1

## 2020-11-22 MED ORDER — SODIUM CHLORIDE 0.9 % IV SOLN
200.0000 mg | Freq: Once | INTRAVENOUS | Status: AC
  Administered 2020-11-23: 200 mg via INTRAVENOUS
  Filled 2020-11-22: qty 40

## 2020-11-22 MED ORDER — ACETAMINOPHEN 650 MG RE SUPP: 650.0000 mg | Freq: Four times a day (QID) | RECTAL | Status: DC | PRN

## 2020-11-22 MED ORDER — POTASSIUM CHLORIDE 10 MEQ/100ML IV SOLN
10.0000 meq | INTRAVENOUS | Status: DC
  Filled 2020-11-22: qty 100

## 2020-11-22 MED ORDER — APIXABAN 5 MG PO TABS
5.0000 mg | ORAL_TABLET | Freq: Two times a day (BID) | ORAL | Status: DC
  Filled 2020-11-22: qty 1

## 2020-11-22 MED ORDER — SODIUM CHLORIDE 0.9 % IV SOLN
3.0000 g | Freq: Once | INTRAVENOUS | Status: AC
  Administered 2020-11-22: 3 g via INTRAVENOUS
  Filled 2020-11-22: qty 8

## 2020-11-22 MED ORDER — LOSARTAN POTASSIUM 50 MG PO TABS: 25.0000 mg | ORAL_TABLET | Freq: Every day | ORAL | Status: DC

## 2020-11-22 MED ORDER — ACETAMINOPHEN 325 MG PO TABS: 650.0000 mg | ORAL_TABLET | Freq: Four times a day (QID) | ORAL | Status: DC | PRN

## 2020-11-22 MED ORDER — FUROSEMIDE 10 MG/ML IJ SOLN
60.0000 mg | Freq: Once | INTRAMUSCULAR | Status: AC
  Administered 2020-11-22: 60 mg via INTRAVENOUS
  Filled 2020-11-22: qty 6

## 2020-11-22 MED ORDER — PROSOURCE PLUS PO LIQD: 30.0000 mL | Freq: Three times a day (TID) | ORAL | Status: DC

## 2020-11-22 MED ORDER — FUROSEMIDE 20 MG PO TABS
20.0000 mg | ORAL_TABLET | Freq: Every day | ORAL | Status: DC
  Administered 2020-11-24 – 2020-11-25 (×2): 20 mg via ORAL
  Filled 2020-11-22 (×2): qty 1

## 2020-11-22 MED ORDER — SODIUM CHLORIDE 0.9 % IV SOLN
100.0000 mg | Freq: Every day | INTRAVENOUS | Status: DC
  Administered 2020-11-23 – 2020-11-25 (×3): 100 mg via INTRAVENOUS
  Filled 2020-11-22 (×4): qty 20

## 2020-11-22 MED ORDER — ROSUVASTATIN CALCIUM 20 MG PO TABS
20.0000 mg | ORAL_TABLET | Freq: Every day | ORAL | Status: DC
  Administered 2020-11-24 – 2020-11-25 (×2): 20 mg via ORAL
  Filled 2020-11-22 (×2): qty 1

## 2020-11-22 MED ORDER — POTASSIUM CHLORIDE CRYS ER 20 MEQ PO TBCR
40.0000 meq | EXTENDED_RELEASE_TABLET | Freq: Two times a day (BID) | ORAL | Status: DC
  Filled 2020-11-22: qty 2

## 2020-11-22 NOTE — Hospital Course (Addendum)
Acute hypoxic respiratory failure likely due to COVID-19: Symptoms (mainly SoB) started 5 days prior to the [facility] on 9/6.  Was started on BiPAP in the emergency department.  CXR showed a potential area of consolidation in the RLL, no evidence of volume overload. Patient's BNP was approximately 1,000, but this was also found on a previous hospitalization for a PE.  Was started on remdesivir and Decadron and continued on his home Eliquis, after one day of therapeutic Lovenox (Pt had to be NPO due to BiPAP). He was weaned off BiPAP on 9/7. On 9/8 he was moved to room air and continued to have appropriate oxygen saturations. ON 9/9, after more than 24 hours on room air, the patient was discharged. He was discharged after finishing a 4 day course of Remdesivir. He was discharged to finish a 10 day course of Decadron (9/6-9/15).   History of pulmonary embolism in July 2022 and DVT in May 2022: Patient reports being compliant with his Eliquis in the outpatient setting.  He was continued on this in the inpatient setting. Low concern for new PE on this admission.   Severe HFrEF Hx HFrEF (EF less than 20% on echo from July 2022), and a BNP of 1072.  Home meds include losartan 25 mg daily and metoprolol 12.5 mg daily and KCl 10 mEq/day. His losartan was held at one point for soft blood pressures. He was continued on his home medications. He did not appear volume overloaded at any point during this admission.   Left foot ulcer  Hx PAD Pt has chronic left foot ulcer. Pt remains afebrile and WBC is wnl. ABI performed 09/22/20: "Left: Resting left ankle-brachial index indicates severe left lower extremity arterial disease. The left great toe flow is absent." No changes this hospitalization.   Severe protein calorie malnutrition Continued with feeding with assist, prosource plus feeding supplement.   HLD  Last lipid panel performed on 09/29/19: Total cholesterol: 164, Triglycerides 97, HDL 49, LDL 97. Home med:  Rosuvastatin '20mg'$  daily, continued.    Prolonged QTc Per EKG prolonged QT to 568. QT prolonging agents were avoided.   Code Status  GOC Pt initially expressed desire to be full code. After discussion with palliative, patient expressed a desire to be DNR/DNI.   PCP follow-up task: F/U respiratory status with pulse ox measurement at next visit  2. Consider addition of spironolactone for HF if hemodynamically appropriate, will also help him retain K. 3. Continued BP management. 4. Continued management of chronic health conditions

## 2020-11-22 NOTE — ED Notes (Addendum)
RN contacted on-call for concerns about PO potassium scheduled for patient. Orders updated by on-call Allee Maxwell. See orders.

## 2020-11-22 NOTE — ED Provider Notes (Signed)
Patient initially seen and evaluated by Dr. Langston Masker.  Assumed care at change of shift. 80 year old male history of cerebral palsy, hypertension, DVT, hypertension, PE, severe congestive heart failure with reported EF less than 20% presents today with shortness of breath.  He was placed on patient's CPAP prior to arrival.  Patient recently in the hospital over the summer with hypoxia and bilateral pleural effusions and pulmonary embolism.  He underwent thoracentesis at that time and had transudative fluid drained.  He was discharged home on Eliquis.  Was reported that he is DNR R and DNI but is not comfort care only at this time. Chest x-[facility] was obtained and reviewed with probable bilateral lower lobe infiltrates.  Patient received IV Rocephin per Dr. Langston Masker CBC is returned and is within normal limits Bmet with mild hyperglycemia and co2 at 20 Covid test positive Oxygenating normally on bipap FP service paged to admit Discussed with Radonna Ricker on for [facility] and they will see for admission.    Pattricia Boss, MD 11/22/20 [phone]

## 2020-11-22 NOTE — ED Provider Notes (Signed)
[facility] EMERGENCY DEPARTMENT Provider Note   CSN: [id number] Arrival date & time:        History No chief complaint on file.   Rick Schwartz is a 80 y.o. male with a history of cerebral palsy, hypertension, severe protein calorie malnutrition, DVT, hypertension, PE, severe congestive heart failure, right lower extremity amputation, presenting to the emergency department with shortness of breath.  Patient arrives by EMS with concern for shortness of breath at home.  He reportedly does not wear oxygen at home per EMS report.  They placed the patient on CPAP.  They gave the patient 5 mg albuterol in route to the hospital.  Per medical record review, patient was recently discharged from the hospital in July 2022, 2 months ago, after admission for acute hypoxia with bilateral pleural effusions and pulmonary embolism.  He underwent thoracentesis at that time with 900 cc of transudative fluid drained.  He was discharged on Eliquis.  He was noted to have severe congestive heart failure, and per cardiology consultation was not felt to be a candidate for any surgical interventions.  He was started on 20 mg of Lasix and losartan and metoprolol.  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  Palliative care was consulted while the patient was in the hospital, and the patient did confirm that he would remain DNR, but otherwise opted for full treatment.  Last echocardiogram was performed in July 2022 showing EF less than 20%, severely decreased function of the left ventricle, global hypokinesis.  HPI     Past Medical History:  Diagnosis Date   Cancer [facility])    Prostate   Cerebral palsy ([place])    Hyperlipidemia    Hypertension    Peripheral arterial disease ([place])    critical limb ischemia    Patient Active Problem List   Diagnosis Date Noted   Chronic HFrEF (heart failure with reduced ejection fraction) ([place])    Dementia without behavioral disturbance  ([place])    Pressure injury of skin 09/22/2020   Protein-calorie malnutrition, severe 09/22/2020   Right pulmonary embolus ([place]) 09/21/2020   Pulmonary embolus ([place]) 09/21/2020   Shortness of breath    Pleural effusion, bilateral    SBO (small bowel obstruction) ([place])    Inanition ([place])    Foot ulcer with necrosis of muscle, right ([place]) 07/18/2020   Severe sepsis ([place]) 07/18/2020   Lactic acidosis 07/18/2020   Hypernatremia 07/18/2020   Hypercalcemia 07/18/2020   Protein-calorie malnutrition ([place])    Gangrene of right foot ([place]) 07/17/2020   Focal motor deficit 07/07/2020   Physical deconditioning 05/06/2020   Prediabetes 07/20/2016   Cough 01/21/2012   HALLUX RIGIDUS, ACQUIRED 04/11/2010   PROSTATE CANCER 09/18/2007   GLUCOSE INTOLERANCE 05/16/2006   HYPERCHOLESTEROLEMIA 05/16/2006   Infantile cerebral palsy ([place]) 05/16/2006   HYPERTENSION, BENIGN SYSTEMIC 05/16/2006   BPH 05/16/2006    Past Surgical History:  Procedure Laterality Date   AMPUTATION Right 07/22/2020   Procedure: RIGHT ABOVE KNEE AMPUTATION;  Surgeon: [name], MD;  Location: [facility];  Service: Orthopedics;  Laterality: Right;   PROSTATE SURGERY         Family History  Problem Relation Age of Onset   Diabetes Mother    Hypertension Mother    Hypertension Father    Diabetes Sister    Hypertension Sister    Diabetes Brother    Hypertension Brother    Diabetes Sister    Thyroid disease Sister  Sudden death Brother     Social History   Tobacco Use   Smoking status: Former    Packs/day: 0.25    Years: 35.00    Pack years: 8.75    Types: Cigarettes    Quit date: 10/12/1994    Years since quitting: 26.1   Smokeless tobacco: Never  Vaping Use   Vaping Use: Never used  Substance Use Topics   Alcohol use: Yes    Comment: Occasional beer and gin   Drug use: No    Home Medications Prior to Admission medications   Medication Sig Start Date End Date Taking? Authorizing Provider   acetaminophen (TYLENOL) 325 MG tablet Take 2 tablets (650 mg total) by mouth every 6 (six) hours. 09/26/20   Ezequiel Essex, MD  apixaban (ELIQUIS) 5 MG TABS tablet Take 2 tablets (10 mg total) by mouth 2 (two) times daily for 2 days. Last day will be 7/13 09/26/20 09/28/20  Ezequiel Essex, MD  apixaban (ELIQUIS) 5 MG TABS tablet Take 2 tablets ('10mg'$  total) by mouth twice daily for 2 days, then beginning 7/14 take 1 tablet (5 mg total) by mouth 2 (two) times daily. 10/20/20   Lurline Del, DO  ascorbic acid (VITAMIN C) 1000 MG tablet Take 1 tablet (1,000 mg total) by mouth daily. 07/28/20   Gifford Shave, MD  furosemide (LASIX) 20 MG tablet Take 1 tablet (20 mg total) by mouth daily. 10/20/20   Welborn, Ryan, DO  losartan (COZAAR) 25 MG tablet Take 1 tablet (25 mg total) by mouth daily. 10/20/20   Lurline Del, DO  metoprolol succinate (TOPROL-XL) 25 MG 24 hr tablet Take 0.5 tablets (12.5 mg total) by mouth daily. 10/20/20   Lurline Del, DO  Nutritional Supplements (,FEEDING SUPPLEMENT, PROSOURCE PLUS) liquid Take 30 mLs by mouth 3 (three) times daily between meals. 07/27/20   Gifford Shave, MD  Nystatin (GERHARDT'S BUTT CREAM) CREA Apply 1 application topically 3 (three) times daily. 07/27/20   Gifford Shave, MD  potassium chloride (KLOR-CON) 10 MEQ tablet Take 1 tablet (10 mEq total) by mouth daily. 11/07/20   Lurline Del, DO  rosuvastatin (CRESTOR) 20 MG tablet Take 1 tablet (20 mg total) by mouth daily. 10/20/20   Lurline Del, DO    Allergies    Patient has no known allergies.  Review of Systems   Review of Systems  Unable to perform ROS: Patient nonverbal (level 5 caveat)   Physical Exam Updated Vital Signs There were no vitals taken for this visit.  Physical Exam Constitutional:      General: He is not in acute distress.    Comments: Thin, chronically ill appearing  HENT:     Head: Normocephalic and atraumatic.  Eyes:     Conjunctiva/sclera: Conjunctivae normal.     Pupils:  Pupils are equal, round, and reactive to light.  Cardiovascular:     Rate and Rhythm: Regular rhythm. Tachycardia present.  Pulmonary:     Effort: Pulmonary effort is normal.     Comments: On Cpap, bilateral rhonchi Musculoskeletal:     Comments: Unilateral lower extremity amputation Chronic wound of remaining heel Contractured extremities  Skin:    General: Skin is warm and dry.  Neurological:     General: No focal deficit present.     Mental Status: He is alert. Mental status is at baseline.    ED Results / Procedures / Treatments   Labs (all labs ordered are listed, but only abnormal results are displayed) Labs Reviewed  RESP PANEL  BY RT-PCR (FLU A&B, COVID) [id number]  BASIC METABOLIC PANEL  CBC WITH DIFFERENTIAL/PLATELET  BRAIN NATRIURETIC PEPTIDE  TROPONIN I (HIGH SENSITIVITY)    EKG None  Radiology No results found.  Procedures .Critical Care  Date/Time: 11/22/2020 2:17 PM Performed by: Wyvonnia Dusky, MD Authorized by: Wyvonnia Dusky, MD   Critical care provider statement:    Critical care time (minutes):  45   Critical care was necessary to treat or prevent imminent or life-threatening deterioration of the following conditions:  Respiratory failure   Critical care was time spent personally by me on the following activities:  Discussions with consultants, evaluation of patient's response to treatment, examination of patient, ordering and performing treatments and interventions, ordering and review of laboratory studies, ordering and review of radiographic studies, pulse oximetry, re-evaluation of patient's condition, obtaining history from patient or surrogate and review of old charts   Medications Ordered in ED Medications - No data to display  ED Course  I have reviewed the triage vital signs and the nursing notes.  Pertinent labs & imaging results that were available during my care of the patient were reviewed by me and considered in my medical decision  making (see chart for details).  Prior medical records reviewed including recent hospitalization course.  Patient is DNR/DNI.  Patient was switched from CPAP to BiPAP on arrival and is breathing much more comfortably.  Vitals appear stable at this time.  Labs and blood tests are pending.  Clinical Course as of 11/22/20 1537  Tue Nov 22, 2020  1513 Patient reassessed and is breathing much more comfortably on the BiPAP.  Oxygen saturation 100%.  Heart rate 87. [MT]  1532 Patient signed out to Dr Jeanell Sparrow EDP pending f/u on labs, reassessment.  IV antibiotics ordered for possible aspiration on xray.  He remains stable and breathing comfortably on bipap. [MT]    Clinical Course User Index [MT] Priti Consoli, Carola Rhine, MD    Final Clinical Impression(s) / ED Diagnoses Final diagnoses:  None    Rx / DC Orders ED Discharge Orders     None        Langston Masker Carola Rhine, MD 11/22/20 1538

## 2020-11-22 NOTE — H&P (Addendum)
[facility] Admission History and Physical Service Pager: [phone]  Patient name: Rick Schwartz Medical record number: [id number] Date of birth: 28-Apr-1940 Age: 80 y.o. Gender: male  Primary Care Provider: Lurline Del, DO Consultants: Palliative  Code Status: Full Code (confirmed with pt, A&O x4), note: Was previously DNR but patient states he wishes to be full code at this time. Preferred Emergency Contact:  Contact Information     Name Relation Home Work Mobile   [place] Sister [phone]     [place], Donag[place] [phone]     Jamey Reas   [phone]       Chief Complaint: SOB  Assessment and Plan: [name] is a 80 y.o. male presenting with SOB . PMH is significant for cerebral palsy, HTN, left foot ulcer, severe protein calorie malnutrition, HFrEF, Hx of DVT, prostate cancer (treated with surgery and radiation several years ago, PAD, Hx of PE, Hx of DVT, s/p R AKA, and HLD.  Acute hypoxic respiratory failure likely 2/2 to COVID-19 Pt initially presented to the ED for SOB onset 5 days ago with accompanying cough. ED Labs include: Chest x-ray: Atelectasis versus PNA without any overt evidence of fluid overload. ABG after BiPAP was initiated showed no signs of respiratory acidosis. K 3.4, Trop 21, CBC unremarkable. EKG showed: No evident acute changes from previous EKG, low voltage, prolonged QT interval. Likely the cause of this SOB is new diagnosis of COVID-19. Pt has tested positive for COVID while in the ED and reports of the following additional respiratory symptoms: Cough. Pt has h/o HFrEF (EF <20% on echo from 09/21/20) as noted below, so this could be included on differential.  BNP of 1072, however has been similar to this at a previous hospitalization.  However, patient does not seem to be fluid overloaded on exam and has been taking his Lasix as prescribed. Pt denies orthopnea as well. Patient also has history of  pulmonary embolism in 09/2020 and has been on long-term Eliquis since.  Denies missing doses.  Confirmed with patient's caretaker that he is taking the Eliquis twice daily and this been taking it appropriately.  Wells score 3.0, with no signs of DVT on exam and no evident tachycardia while in the hospital. Less likely ACS cause as EKG shows no acute changes with initial trop 21, but no evident chest pain present on exam. We will continue to monitor symptoms and have low threshold to explore other causes of SOB given the pt's history.  In the ED, the pt was given a dose of unasyn prior to his COVID test returning positive as well as Lasix '60mg'$  IV.  -Admit to progressive, Attending Dr. Andria Frames  -VS per floor protocol  -Continuous cardiac monitoring  -Continuous pulse ox  -Wean BiPaP as able  -CMP/CBC tomorrow  -Remdesivir/Decadron for COVID -Trend [place]  - Low threshold for CTPA  -Tylenol '65mg'$  q6PRN for pain  -Airborne and contact precautions -Palliative consult  H/O pulmonary embolism in 09/2020  H/O DVT 07/2020 Patient was hospitalized for pulmonary embolism in July 2022 and was placed on Eliquis for long-term treatment given his history of DVT as well. There is no evidence of DVT on physical exam today. Per patient's sister/caretaker, he takes his Eliquis as prescribed.  Per both the patient and his sister she is the one who manages his medications.  We will have a low threshold to get a CTPA. Home meds: Eliquis '5mg'$  2xdaily.  -Continue with Eliquis '5mg'$  BID -Monitor vital signs  -  If pt cannot receive Eliquis orally given BiPaP, can consider heparin drip versus Lovenox for anticoagulation  Severe HFrEF Echo performed 09/21/20 showed LVEF <20% with severely decreased function and global hypokinesis, LVH present. No overt signs of fluid overload on exam. S/P Lasix '60mg'$  one dose IV. Home med: Lasix 20 daily, Losartan '25mg'$  daily, metoprolol 12.5 daily, and potassium chloride 36mq 1xdaily. K on admission  3.4  -Continue with home medication Lasix 20 daily, losartan '25mg'$  daily, and metoprolol 12.5 daily  -Replete potassium 40 meq tablet one dose  -Monitor CMP  -Possibly consult heart failure team -Strict I's and O's -Daily weights  Left foot ulcer  Hx PAD Pt has chronic left foot ulcer that was bandaged in dressing while we were present in the room. Pt remains afebrile and WBC is wnl. ABI performed 09/22/20: "Left: Resting left ankle-brachial index indicates severe left lower extremity arterial disease. The left great toe flow is absent." -Wound care consult  Severe protein calorie malnutrition Continue with home feeds, prosource plus feeding supplement.  -Nutrition consulted -Heart healthy diet -We will continue patient's home feeding supplement between meals  HLD  Last lipid panel performed on 09/29/19: Total cholesterol: 164, Triglycerides 97, HDL 49, LDL 97. Home med: Rosuvastatin '20mg'$  daily -Continue with home dose of Rosuvastatin   Prolonged QTc Per EKG prolonged QT to 568.  -Avoid QT prolonging agents -AM EKG  Code Status  GOC Pt has decided to continue with full code.  On last admission, he was DNR/DNI with palliative on board.  Patient is completely alert and oriented and has full capacity to decide this.  We will consult palliative to discuss goals of care with the patient given his multiple comorbidities.  -Appreciate palliative recs  Hypokalemia Initial potassium of 4.0, 3.4 on recheck.  Patient does take 10 mEq potassium at home daily.  Will replenish potassium with 452m oral -Replete as above -A.m. CMP  FEN/GI: Heart healthy  Prophylaxis: On eliquis   Disposition: Progressive  History of Present Illness:  Rick Schwartz a [age].o. male presenting with SOB onset 5 days ago. PMHx significant for cerebral palsy, HTN, foot ulcer, severe protein calorie malnutrition, HFrEF, Hx of DVT, prostate cancer (treated with surgery and radiation several years ago, PAD, Hx of  PE, and HLD. Patient notes that he has had a cough and increasing shortness of breath since Friday of last week.  He notes that he has had no other respiratory symptoms.  HPI was difficult to obtain given that patient was on BiPAP and hard to hear.  Pt denies chest pain, nausea, vomiting. Patient gave permission to speak to his sister who is his primary caretaker and lives with him, PaLynnell Chad  Per patient's sister, he tried to take Mucinex with minimal relief.  [name] reports that he takes all of his medications as prescribed and has taken every medication today except for his Eliquis at night. She confirmed full code status as well.  Patient is a former smoker, quit 3 years ago States he has about 1 drink of alcohol daily Denies illicit drugs  Review Of Systems: Per HPI with the following additions:   Review of Systems  HENT:  Negative for postnasal drip and sore throat.   Respiratory:  Positive for cough and shortness of breath.   Cardiovascular:  Negative for chest pain and leg swelling.  Gastrointestinal:  Negative for abdominal pain, diarrhea, nausea and vomiting.    Patient Active Problem List   Diagnosis  Date Noted   COVID-19 11/22/2020   Chronic HFrEF (heart failure with reduced ejection fraction) (HCC)    Dementia without behavioral disturbance (HCC)    Pressure injury of skin 09/22/2020   Protein-calorie malnutrition, severe 09/22/2020   Right pulmonary embolus ([place]) 09/21/2020   Pulmonary embolus ([place]) 09/21/2020   Shortness of breath    Pleural effusion, bilateral    SBO (small bowel obstruction) ([place])    Inanition ([place])    Foot ulcer with necrosis of muscle, right ([place]) 07/18/2020   Severe sepsis ([place]) 07/18/2020   Lactic acidosis 07/18/2020   Hypernatremia 07/18/2020   Hypercalcemia 07/18/2020   Protein-calorie malnutrition ([place])    Gangrene of right foot ([place]) 07/17/2020   Focal motor deficit 07/07/2020   Physical deconditioning 05/06/2020    Prediabetes 07/20/2016   Cough 01/21/2012   HALLUX RIGIDUS, ACQUIRED 04/11/2010   PROSTATE CANCER 09/18/2007   GLUCOSE INTOLERANCE 05/16/2006   HYPERCHOLESTEROLEMIA 05/16/2006   Infantile cerebral palsy ([place]) 05/16/2006   HYPERTENSION, BENIGN SYSTEMIC 05/16/2006   BPH 05/16/2006    Past Medical History: Past Medical History:  Diagnosis Date   Cancer ([place])    Prostate   Cerebral palsy ([place])    Hyperlipidemia    Hypertension    Peripheral arterial disease ([place])    critical limb ischemia    Past Surgical History: Past Surgical History:  Procedure Laterality Date   AMPUTATION Right 07/22/2020   Procedure: RIGHT ABOVE KNEE AMPUTATION;  Surgeon: [name], MD;  Location: [facility];  Service: Orthopedics;  Laterality: Right;   PROSTATE SURGERY      Social History: Social History   Tobacco Use   Smoking status: Former    Packs/day: 0.25    Years: 35.00    Pack years: 8.75    Types: Cigarettes    Quit date: 10/12/1994    Years since quitting: 26.1   Smokeless tobacco: Never  Vaping Use   Vaping Use: Never used  Substance Use Topics   Alcohol use: Yes    Comment: Occasional beer and gin   Drug use: No   Additional social history: Pt stopped smoking cigarettes 3 years ago, drinks one alcoholic beverage a night, and denies any illicit drug use.   Please also refer to relevant sections of EMR.  Family History: Family History  Problem Relation Age of Onset   Diabetes Mother    Hypertension Mother    Hypertension Father    Diabetes Sister    Hypertension Sister    Diabetes Brother    Hypertension Brother    Diabetes Sister    Thyroid disease Sister    Sudden death Brother     Allergies and Medications: No Known Allergies No current facility-administered medications on file prior to encounter.   Current Outpatient Medications on File Prior to Encounter  Medication Sig Dispense Refill   acetaminophen (TYLENOL) 325 MG tablet Take 2 tablets (650 mg total) by mouth  every 6 (six) hours.     apixaban (ELIQUIS) 5 MG TABS tablet Take 2 tablets (10 mg total) by mouth 2 (two) times daily for 2 days. Last day will be 7/13 10 tablet 0   apixaban (ELIQUIS) 5 MG TABS tablet Take 2 tablets ('10mg'$  total) by mouth twice daily for 2 days, then beginning 7/14 take 1 tablet (5 mg total) by mouth 2 (two) times daily. 70 tablet 1   ascorbic acid (VITAMIN C) 1000 MG tablet Take 1 tablet (1,000 mg total) by mouth daily.  furosemide (LASIX) 20 MG tablet Take 1 tablet (20 mg total) by mouth daily. 30 tablet 1   losartan (COZAAR) 25 MG tablet Take 1 tablet (25 mg total) by mouth daily. 30 tablet 1   metoprolol succinate (TOPROL-XL) 25 MG 24 hr tablet Take 0.5 tablets (12.5 mg total) by mouth daily. 30 tablet 1   Nutritional Supplements (,FEEDING SUPPLEMENT, PROSOURCE PLUS) liquid Take 30 mLs by mouth 3 (three) times daily between meals.     Nystatin (GERHARDT'S BUTT CREAM) CREA Apply 1 application topically 3 (three) times daily.     potassium chloride (KLOR-CON) 10 MEQ tablet Take 1 tablet (10 mEq total) by mouth daily. 30 tablet 0   rosuvastatin (CRESTOR) 20 MG tablet Take 1 tablet (20 mg total) by mouth daily. 30 tablet 0    Objective: BP (!) 131/105   Pulse 80   Temp (!) 97.3 F (36.3 C) (Oral)   Resp 19   Ht '5\' 7"'$  (1.702 m)   Wt 43 kg   SpO2 100%   BMI 14.85 kg/m  Physical Exam Vitals reviewed.  Constitutional:      Comments: Pt is frail appearing elderly man with BiPaP in place with multiple contractures on the right side and right AKA.   Cardiovascular:     Rate and Rhythm: Normal rate and regular rhythm.     Heart sounds: No murmur heard.   No gallop.  Pulmonary:     Comments: Fine crackles in bilateral bases with BiPaP in place  Abdominal:     General: Bowel sounds are normal.     Palpations: Abdomen is soft.     Tenderness: There is no abdominal tenderness.  Musculoskeletal:     Comments: Right AKA with incision site well healed and no evidence of  infection or discharge Left foot wound covered in dressing that is clean and dry  Skin:    General: Skin is warm and dry.  Neurological:     Mental Status: He is alert.  Psychiatric:        Mood and Affect: Mood normal.        Behavior: Behavior normal.     Labs and Imaging: CBC BMET  Recent Labs  Lab 11/22/20 1414 11/22/20 1819  WBC 6.2  --   HGB 15.7 16.7  HCT 49.4 49.0  PLT 225  --    Recent Labs  Lab 11/22/20 1656 11/22/20 1819  NA 140 145  K 4.0 3.4*  CL 110  --   CO2 20*  --   BUN 9  --   CREATININE 0.70  --   GLUCOSE 117*  --   CALCIUM 9.3  --      EKG: My own interpretation (not copied from electronic read): No evident acute changes from previous EKG, low voltage, prolonged QT interval    Erskine Emery, MD 11/22/2020, 8:56 PM PGY-1, [place] Intern pager: [phone], text pages welcome  Upper Level Addendum:  I have seen and evaluated this patient along with Dr. Zigmund Daniel and reviewed the above note, making necessary revisions as appropriate in green.  I agree with the medical decision making and physical exam as noted above.  Lurline Del, DO PGY-3 [facility] Family Medicine Residency

## 2020-11-23 DIAGNOSIS — U071 COVID-19: Secondary | ICD-10-CM | POA: Diagnosis present

## 2020-11-23 DIAGNOSIS — E7800 Pure hypercholesterolemia, unspecified: Secondary | ICD-10-CM | POA: Diagnosis present

## 2020-11-23 DIAGNOSIS — Z86711 Personal history of pulmonary embolism: Secondary | ICD-10-CM | POA: Diagnosis not present

## 2020-11-23 DIAGNOSIS — Z515 Encounter for palliative care: Secondary | ICD-10-CM

## 2020-11-23 DIAGNOSIS — Z833 Family history of diabetes mellitus: Secondary | ICD-10-CM | POA: Diagnosis not present

## 2020-11-23 DIAGNOSIS — J9601 Acute respiratory failure with hypoxia: Secondary | ICD-10-CM

## 2020-11-23 DIAGNOSIS — Z87891 Personal history of nicotine dependence: Secondary | ICD-10-CM | POA: Diagnosis not present

## 2020-11-23 DIAGNOSIS — I739 Peripheral vascular disease, unspecified: Secondary | ICD-10-CM | POA: Diagnosis present

## 2020-11-23 DIAGNOSIS — Z8546 Personal history of malignant neoplasm of prostate: Secondary | ICD-10-CM | POA: Diagnosis not present

## 2020-11-23 DIAGNOSIS — Z7189 Other specified counseling: Secondary | ICD-10-CM

## 2020-11-23 DIAGNOSIS — G809 Cerebral palsy, unspecified: Secondary | ICD-10-CM | POA: Diagnosis present

## 2020-11-23 DIAGNOSIS — Z923 Personal history of irradiation: Secondary | ICD-10-CM | POA: Diagnosis not present

## 2020-11-23 DIAGNOSIS — I5022 Chronic systolic (congestive) heart failure: Secondary | ICD-10-CM

## 2020-11-23 DIAGNOSIS — G800 Spastic quadriplegic cerebral palsy: Secondary | ICD-10-CM

## 2020-11-23 DIAGNOSIS — Z79899 Other long term (current) drug therapy: Secondary | ICD-10-CM | POA: Diagnosis not present

## 2020-11-23 DIAGNOSIS — E43 Unspecified severe protein-calorie malnutrition: Secondary | ICD-10-CM | POA: Diagnosis present

## 2020-11-23 DIAGNOSIS — Z89611 Acquired absence of right leg above knee: Secondary | ICD-10-CM | POA: Diagnosis not present

## 2020-11-23 DIAGNOSIS — N400 Enlarged prostate without lower urinary tract symptoms: Secondary | ICD-10-CM | POA: Diagnosis present

## 2020-11-23 DIAGNOSIS — I110 Hypertensive heart disease with heart failure: Secondary | ICD-10-CM | POA: Diagnosis present

## 2020-11-23 DIAGNOSIS — J1282 Pneumonia due to coronavirus disease 2019: Secondary | ICD-10-CM | POA: Diagnosis present

## 2020-11-23 DIAGNOSIS — J9600 Acute respiratory failure, unspecified whether with hypoxia or hypercapnia: Secondary | ICD-10-CM | POA: Insufficient documentation

## 2020-11-23 DIAGNOSIS — Z8249 Family history of ischemic heart disease and other diseases of the circulatory system: Secondary | ICD-10-CM | POA: Diagnosis not present

## 2020-11-23 DIAGNOSIS — R64 Cachexia: Secondary | ICD-10-CM | POA: Diagnosis present

## 2020-11-23 DIAGNOSIS — Z86718 Personal history of other venous thrombosis and embolism: Secondary | ICD-10-CM | POA: Diagnosis not present

## 2020-11-23 DIAGNOSIS — Z66 Do not resuscitate: Secondary | ICD-10-CM | POA: Diagnosis present

## 2020-11-23 DIAGNOSIS — Z7901 Long term (current) use of anticoagulants: Secondary | ICD-10-CM | POA: Diagnosis not present

## 2020-11-23 DIAGNOSIS — F0390 Unspecified dementia without behavioral disturbance: Secondary | ICD-10-CM | POA: Diagnosis present

## 2020-11-23 DIAGNOSIS — Z681 Body mass index (BMI) 19 or less, adult: Secondary | ICD-10-CM | POA: Diagnosis not present

## 2020-11-23 LAB — COMPREHENSIVE METABOLIC PANEL
ALT: 8 U/L (ref 0–44)
AST: 12 U/L — ABNORMAL LOW (ref 15–41)
Albumin: 2.5 g/dL — ABNORMAL LOW (ref 3.5–5.0)
Alkaline Phosphatase: 47 U/L (ref 38–126)
Anion gap: 10 (ref 5–15)
BUN: 10 mg/dL (ref 8–23)
CO2: 23 mmol/L (ref 22–32)
Calcium: 9 mg/dL (ref 8.9–10.3)
Chloride: 109 mmol/L (ref 98–111)
Creatinine, Ser: 0.6 mg/dL — ABNORMAL LOW (ref 0.61–1.24)
GFR, Estimated: >60 mL/min (ref >60)
Glucose, Bld: 151 mg/dL — ABNORMAL HIGH (ref 70–99)
Potassium: 3.7 mmol/L (ref 3.5–5.1)
Sodium: 142 mmol/L (ref 135–145)
Total Bilirubin: 0.7 mg/dL (ref 0.3–1.2)
Total Protein: 6.2 g/dL — ABNORMAL LOW (ref 6.5–8.1)

## 2020-11-23 LAB — CBC
HCT: 50.3 % (ref 39.0–52.0)
Hemoglobin: 15.5 g/dL (ref 13.0–17.0)
MCH: 28.6 pg (ref 26.0–34.0)
MCHC: 30.8 g/dL (ref 30.0–36.0)
MCV: 92.8 fL (ref 80.0–100.0)
Platelets: 234 10*3/uL (ref 150–400)
RBC: 5.42 MIL/uL (ref 4.22–5.81)
RDW: 16.6 % — ABNORMAL HIGH (ref 11.5–15.5)
WBC: 8.6 10*3/uL (ref 4.0–10.5)
nRBC: 0 % (ref 0.0–0.2)

## 2020-11-23 MED ORDER — POTASSIUM CHLORIDE CRYS ER 10 MEQ PO TBCR
10.0000 meq | EXTENDED_RELEASE_TABLET | Freq: Every day | ORAL | Status: DC
  Administered 2020-11-24 – 2020-11-25 (×2): 10 meq via ORAL
  Filled 2020-11-23 (×2): qty 1

## 2020-11-23 MED ORDER — POTASSIUM CHLORIDE 10 MEQ/100ML IV SOLN
10.0000 meq | INTRAVENOUS | Status: AC
  Administered 2020-11-23 (×4): 10 meq via INTRAVENOUS
  Filled 2020-11-23 (×3): qty 100

## 2020-11-23 MED ORDER — ADULT MULTIVITAMIN W/MINERALS CH
1.0000 | ORAL_TABLET | Freq: Every day | ORAL | Status: DC
  Administered 2020-11-23 – 2020-11-25 (×3): 1 via ORAL
  Filled 2020-11-23 (×3): qty 1

## 2020-11-23 MED ORDER — ENOXAPARIN SODIUM 40 MG/0.4ML IJ SOSY
40.0000 mg | PREFILLED_SYRINGE | Freq: Two times a day (BID) | INTRAMUSCULAR | Status: DC
  Administered 2020-11-23 (×2): 40 mg via SUBCUTANEOUS
  Filled 2020-11-23 (×2): qty 0.4

## 2020-11-23 MED ORDER — POTASSIUM CHLORIDE 10 MEQ/100ML IV SOLN: 10.0000 meq | INTRAVENOUS | Status: DC

## 2020-11-23 MED ORDER — ENSURE ENLIVE PO LIQD
237.0000 mL | Freq: Three times a day (TID) | ORAL | Status: DC
  Administered 2020-11-23 – 2020-11-25 (×6): 237 mL via ORAL

## 2020-11-23 NOTE — Progress Notes (Addendum)
Family Medicine Teaching Service Daily Progress Note Intern Pager: [phone]  Patient name: Rick Schwartz Medical record number: [id number] Date of birth: January 31, 1941 Age: 80 y.o. Gender: male  Primary Care Provider: Lurline Del, DO Consultants: Palliative Code Status: FULL, may change with palliative discussion  Pt Overview and Major Events to Date:  9/6: admitted  Assessment and Plan:  Rick Schwartz is a 80 year old male presenting with shortness of breath.  PMH is significant for cerebral palsy, hypertension, left foot ulcer, severe protein calorie malnutrition, HFrEF (EF less than 20% on echo from July 2022), prostate cancer (treated with surgery and radiation several years ago), PAD, DVT, PE (recent, on Eliquis), s/p right AKA, and HLD.  Acute hypoxic respiratory failure likely secondary to COVID-19 Patient began having shortness of breath 5 days ago with accompanying cough.  CXR notable for right basilar opacity concerning for aspiration or infection.  COVID test positive.  In addition to COVID-pneumonia, differential also includes heart failure exacerbation and PE.  Patient with Hx HFrEF (EF less than 20% on echo from July 2022), and a BNP of 1072.  History notable for an even more elevated BNP during previous admission for PE.  Physical exam notable for a lack of pedal/dependent edema, no orthopnea. Regarding PE, patient has a Well's score of 3, scoring for immobilization and history of PE.  Pertinent negatives include a lack of tachycardia.  In the ED patient received BiPAP and improved quickly. Currently patient is able to speak in short sentences and says that he is not having any SoB. Satting at 100%. Continue to consider PE, HF, and aspiration PNA as causes of dyspnea.  -Switch from BiPAP to salter ([place]) for potential wean from BiPAP. Contacting RT currently. -Start therapeutic Lovenox  40 mg q12hrs in place of home Eliquis  -Remdesivir for 5 days for COVID PNA -Decadron until  discharge for COVID PNA -consider PPI PPX  -Airborne and contact precautions -Tylenol 650 mg every 6 hours as needed for pain  Severe HFrEF Hx HFrEF (EF less than 20% on echo from July 2022), and a BNP of 1072.  Home meds include losartan 25 mg daily and metoprolol 12.5 mg daily and KCl 10 mEq/day.  K currently 3.7, s/p 40 mEq IV. given Lasix 60 mg IV 1 dose. Physical exam notable for a lack of pedal/dependent edema. Currently has soft pressures, approximately 105/70, may be related to lack of PO intake over the past day. Must use IVF with caution. Also must consider that him being a little bit dry may be beneficial for COVID PNA. Plan is to not add any IVF for now and see if he can take PO once off BiPAP.  -CTM vitals -Continue home Lasix 20 mg daily - Continue home KCl 10 mEq/day starting tomorrow -Daily BMP - Continue home losartan 25 mg daily - Continue home metoprolol 12.5 mg daily -Consider addition of Spironolactone once more hemodynamically stable -CTM exam for volume overload -F/u Daily weights -Strict I and O's -Consider HF consult  Hx of PE in July 2022 Patient on long-term Eliquis for previous hospitalization with PE. Caretaker reports full compliance with medication. No objective evidence of DVT on exam.  -Therapeutic Lovenox as above -Monitor for tachycardia -Consider CTPA if condition worsens -Monitor for signs of right heart strain  Left foot ulcer  Hx PAD Pt has chronic left foot ulcer. Pt remains afebrile and WBC is wnl. ABI performed 09/22/20: "Left: Resting left ankle-brachial index indicates severe left lower extremity arterial disease.  The left great toe flow is absent." -Wound care consult  Severe protein calorie malnutrition Continue with home feeds, prosource plus feeding supplement.  -Nutrition consulted -Heart healthy diet -We will continue patient's home feeding supplement between meals, when able to   HLD  Last lipid panel performed on 09/29/19: Total  cholesterol: 164, Triglycerides 97, HDL 49, LDL 97. Home med: Rosuvastatin '20mg'$  daily -Continue with home dose of Rosuvastatin    Prolonged QTc Per EKG prolonged QT to 568.  -Repeat EKG this AM -Avoid QT prolonging agents  Code Status  GOC Pt has decided to continue with full code.  On last admission, he was DNR/DNI with palliative on board.  Per night team, patient is completely alert and oriented and has full capacity to decide this. We will consult palliative to discuss goals of care with the patient given his multiple comorbidities.  -Appreciate palliative recs  FEN/GI: heart healthy diet when able, no IVF, will monitor PO intake PPx: on therapeutic Lovenox Dispo:return home with caregiver pending clinical improvement  Subjective:  This morning patient is found on BiPAP, FiO2 40%, satting at 100%. He does not appear to be in respiratory distress. He reports having no SoB currently. He is able to relate that he is located in a hospital and that the month is September. ROS negative for CP, SoB, N/V, diarrhea.   Objective: Temp:  [97.3 F (36.3 C)] 97.3 F (36.3 C) (09/06 1418) Pulse Rate:  [25-122] 35 (09/07 0430) Resp:  [0-34] 13 (09/07 0430) BP: (100-135)/(63-105) 102/69 (09/07 0430) SpO2:  [88 %-100 %] 92 % (09/07 0430) FiO2 (%):  [40 %] 40 % (09/06 1912) Weight:  [94 lb 12.8 oz (43 kg)] 94 lb 12.8 oz (43 kg) (09/06 1420) Physical Exam Vitals reviewed.  Constitutional:      Comments: Ill-appearing male, lying in bed with notable contractures of the R arm and hand  Cardiovascular:     Rate and Rhythm: Normal rate and regular rhythm.     Pulses: Normal pulses.     Heart sounds: Normal heart sounds. No murmur heard. Pulmonary:     Effort: Pulmonary effort is normal.     Breath sounds: Normal breath sounds.  Abdominal:     General: Bowel sounds are normal.     Palpations: Abdomen is soft.     Tenderness: There is no abdominal tenderness.  Skin:    General: Skin is warm  and dry.  Neurological:     Mental Status: He is alert.     Laboratory: Recent Labs  Lab 11/22/20 1414 11/22/20 1819 11/23/20 0516  WBC 6.2  --  8.6  HGB 15.7 16.7 15.5  HCT 49.4 49.0 50.3  PLT 225  --  234   Recent Labs  Lab 11/22/20 1656 11/22/20 1819 11/23/20 0516  NA 140 145 142  K 4.0 3.4* 3.7  CL 110  --  109  CO2 20*  --  23  BUN 9  --  10  CREATININE 0.70  --  0.60*  CALCIUM 9.3  --  9.0  PROT  --   --  6.2*  BILITOT  --   --  0.7  ALKPHOS  --   --  47  ALT  --   --  8  AST  --   --  12*  GLUCOSE 117*  --  151*    Imaging/Diagnostic Tests: CXR showing likely PNA, positive Covid test   Corky Sox PGY-1, Psychiatry [facility] Intern pager: [phone], text  pages welcome

## 2020-11-23 NOTE — Progress Notes (Signed)
Pt admitted to 5W16 from ED. A&O x4, VS stable, Tele monitor verified. Skin intact except chronic ulcer L heel. Drsg changed. IV L FA, SL Call bell within reach. All questions/concerns addressed. Will cont to monitor.    11/23/20 1440  Vitals  BP 93/68  MAP (mmHg) 77  BP Location Left Arm  BP Method Automatic  Patient Position (if appropriate) Lying  Pulse Rate 83  Pulse Rate Source Monitor  ECG Heart Rate 85  Resp 20  Level of Consciousness  Level of Consciousness Alert  MEWS COLOR  MEWS Score Color Green  Oxygen Therapy  SpO2 100 %  O2 Device HFNC  O2 Flow Rate (L/min) 6 L/min  Patient Activity (if Appropriate) In bed  Pulse Oximetry Type Continuous  Pain Assessment  Pain Score 0  MEWS Score  MEWS Temp 0  MEWS Systolic 1  MEWS Pulse 0  MEWS RR 0  MEWS LOC 0  MEWS Score 1

## 2020-11-23 NOTE — Progress Notes (Signed)
FPTS Interim Progress Note  S: Patient with no concerns or complaints at this time, he is breathing comfortably with high flow nasal cannula in place on 6 L.  He seems much more comfortable since we admitted him last night when he was on the BiPAP.  O: BP 106/62 (BP Location: Left Arm)   Pulse 83   Temp 98.2 F (36.8 C) (Oral)   Resp 18   Ht '5\' 7"'$  (1.702 m)   Wt 43 kg   SpO2 100%   BMI 14.85 kg/m   General: Frail appearing individual with nasal cannula in place resting comfortably in bed, notable contracture of the right arm Respiratory: Fine crackles present in the bilateral bases, no respiratory distress on nasal cannula in place Cardiac: RRR  A/P: Patient appears much more comfortable on nasal cannula than when he was on BiPAP when we admitted him last night.  Breathing status seems to be maintaining.  Continuing treatment with remdesivir and Decadron for COVID-19.  Lurline Del, DO 11/23/2020, 10:49 PM PGY-3, [place] Medicine Service pager [phone]

## 2020-11-23 NOTE — Consult Note (Signed)
Consultation Note Date: 11/23/2020   Patient Name: Rick Schwartz  DOB: Jan 16, 1941  MRN: [id number]  Age / Sex: 80 y.o., male  PCP: Lurline Del, DO Referring Physician: Zenia Resides, MD  Reason for Consultation: DNR discussion  HPI/Patient Profile: 80 y.o. male  with past medical history of cerebral palsy, CHF, HTN, foot ulcer, severe protein calorie malnutrition, DVT, prostate cancer, PAD, HLD  admitted on 11/22/2020 with covid pnuemonia. He required Bipap last night but is currently breathing better on [place]. Palliative medicine consulted for "goals of care - previously DNR, requests full code this admission, many comorbidities"   Primary Decision Maker NEXT OF KIN- sister- Rick Schwartz  Discussion: Chart reviewed.  Patient is familiar to Palliative team and is also followed outpatient by [facility]- most recently seen on 8/11 of this year. In previous discussions with him and his sister- advanced care planning has been DNR/DNI but otherwise full scope care.  Examined and interviewed patient.  He tells me his is in the hospital for "difficulty breathing". I asked him if the doctor's had told him why he was having difficulty breathing he said no and that noone has told him anything. He is also unable to tell me about any of his other medical conditions.  I reviewed with him that he has tested positive for Covid and he has pneumonia. He stated that he just wants some water and something to eat.  I attempted to discuss [place] and code status with him. I reviewed with him that in the past he and his family had said they wished for no CPR or life support- but all other measures to keep him alive. I showed him the DNR form that is in his chart.  He responded that he didn't know anything about those things, and that his sister would know what to do. I asked him if he preferred for me to speak to his  sister and do what she said and he said "yes".  I called his sister Rick Schwartz. She confirmed that [place] are to treat what is treatable and agrees with DNR.  We discussed Gian's capacity for decision making- he is alert and oriented however, he does not appear to fully understand his situation, he is unable to appreciate and synthesize information given to him regarding his medical care at this time and therefore, doesn't have capacity at this moment to make a decision regarding code status. Additionally, he was unwilling to state his decision in my interview with him and deferred to his sister.  Capacity can wax and wane based on a patient's illness trajectory. Rick Schwartz agrees that Zayir has some cognitive deficits and doesn't now and has not in the past been able to care for himself and make complicated medical decisions.   SUMMARY OF RECOMMENDATIONS -DNR -Treat what is treatable with goal being to discharge home with Palliative in place -PMT will shadow and continue [place] discussion if patient does poorly during hospitalization    Code Status/Advance Care Planning: DNR   Prognosis:  Unable to determine  Discharge Planning: Home with Palliative Services  Primary Diagnoses: Present on Admission:  COVID-19    Physical Exam Vitals and nursing note reviewed.  Constitutional:      Comments: Cachectic, frail  Musculoskeletal:     Comments: contractures  Skin:    General: Skin is warm and dry.    Vital Signs: BP (!) 121/57   Pulse 88   Temp (!) 97.3 F (36.3 C) (Oral)   Resp 16   Ht '5\' 7"'$  (1.702 m)   Wt 43 kg   SpO2 100%   BMI 14.85 kg/m  Pain Scale: 0-10   Pain Score: Asleep   SpO2: SpO2: 100 % O2 Device:SpO2: 100 % O2 Flow Rate: .O2 Flow Rate (L/min): 6 L/min  IO: Intake/output summary:  Intake/Output Summary (Last 24 hours) at 11/23/2020 1330 Last data filed at 11/23/2020 0817 Gross per 24 hour  Intake 750 ml  Output --  Net 750 ml    LBM:   Baseline Weight: Weight:  43 kg Most recent weight: Weight: 43 kg        Thank you for this consult. Palliative medicine will continue to follow and assist as needed.   Time In: 1302 Time Out: 1419 Time Total: 77 minutes Greater than 50%  of this time was spent counseling and coordinating care related to the above assessment and plan.  Signed by: Mariana Kaufman, AGNP-C Palliative Medicine    Please contact Palliative Medicine Team phone at [phone] for questions and concerns.  For individual provider: See Shea Evans

## 2020-11-23 NOTE — Progress Notes (Addendum)
Initial Nutrition Assessment  DOCUMENTATION CODES:   Underweight, Severe malnutrition in context of chronic illness  INTERVENTION:   -Ensure Enlive po TID, each supplement provides 350 kcal and 20 grams of protein  -MVI with minerals daily -Magic cup TID with meals, each supplement provides 290 kcal and 9 grams of protein  -Downgrade diet to dysphagia 3 (advanced mechanical soft) for ease of intake -Feeding assistance with meals   NUTRITION DIAGNOSIS:   Severe Malnutrition related to chronic illness (heart failure) as evidenced by severe fat depletion, severe muscle depletion, percent weight loss.  GOAL:   Patient will meet greater than or equal to 90% of their needs  MONITOR:   PO intake, Supplement acceptance, Diet advancement, Labs, Weight trends, Skin, I & O's  REASON FOR ASSESSMENT:   Consult Assessment of nutrition requirement/status  ASSESSMENT:   Rick Schwartz is a 80 year old male presenting with shortness of breath.  PMH is significant for cerebral palsy, hypertension, left foot ulcer, severe protein calorie malnutrition, HFrEF (EF less than 20% on echo from July 2022), prostate cancer (treated with surgery and radiation several years ago), PAD, DVT, PE (recent, on Eliquis), s/p right AKA, and HLD.  Pt admitted with acute respiratory failure secondary to COVID-19.   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   Spoke with pt at bedside, who was pleasant and in good spirits today. He denies eating anything today. He reports his last meal was oatmeal for breakfast yesterday. He shares that he had a good appetite PTA, but unable to provide diet recall. Pt with no teeth and reports consuming a mechanically altered diet at home. He reports also consuming Ensure PTA  Per pt, he reports he has been gaining weight. He is unsure of UBW. Reviewed wt hx; pt has experienced a 14% wt loss over the past 2 months, which is  significant for time frame. Suspect some wt loss may be related to rt AKA which occurred on 07/22/20.  Pt with contractures and will require feeding assistance with meals.   Discussed importance of good meal and supplement intake to promote healing. Pt amenable to Ensure supplements.   Medications reviewed and include decadron, remdesivir, and lasix.   Palliative care following; plan to treat the treatable and discharge home with palliative care services.   NUTRITION - FOCUSED PHYSICAL EXAM:    Diet Order:   Diet Order             Diet Heart Room service appropriate? Yes; Fluid consistency: Thin  Diet effective now                   EDUCATION NEEDS:   Education needs have been addressed  Skin:  Skin Assessment: Skin Integrity Issues: Skin Integrity Issues:: Stage III Stage III: lt heel  Last BM:  Unknown  Height:   Ht Readings from Last 1 Encounters:  11/22/20 '5\' 7"'$  (1.702 m)    Weight:   Wt Readings from Last 1 Encounters:  11/22/20 43 kg    Ideal Body Weight:  61.9 kg (adjusted for rt AKA)  BMI:  Body mass index is 14.85 kg/m.  Estimated Nutritional Needs:   Kcal:  1700-1900  Protein:  90-105 grams  Fluid:  > 1.7 L    Loistine Chance, RD, LDN, [place] Registered Dietitian II Certified Diabetes Care and Education Specialist Please refer to [facility] for RD and/or RD on-call/weekend/after hours pager

## 2020-11-23 NOTE — Consult Note (Signed)
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 Wound type:pressure Pressure Injury POA: Yes Measurement:To be obtained by Bedside RN today and documented on nursing flow sheet Drainage (amount, consistency, odor) scant Periwound: intact  Dressing procedure/placement/frequency: I will continue the previous POC using a NS cleanse followed by placement of a xeroform antimicrobial nonadherent dressing. This is to be secured with a few turns of Kerlix roll gauze/paper tape.  The foot is to be placed into a pressure redistribution heel boot (Prevalon). A sacral foam is to be placed to the sacrum for PI prevention.  [place] nursing team will not follow, but will remain available to this patient, the nursing and medical teams.  Please re-consult if needed. Thanks, Maudie Flakes, MSN, RN, [place], Arther Abbott  Pager# [phone]

## 2020-11-24 LAB — COMPREHENSIVE METABOLIC PANEL
ALT: 9 U/L (ref 0–44)
AST: 16 U/L (ref 15–41)
Albumin: 2.4 g/dL — ABNORMAL LOW (ref 3.5–5.0)
Alkaline Phosphatase: 42 U/L (ref 38–126)
Anion gap: 8 (ref 5–15)
BUN: 21 mg/dL (ref 8–23)
CO2: 23 mmol/L (ref 22–32)
Calcium: 9.2 mg/dL (ref 8.9–10.3)
Chloride: 108 mmol/L (ref 98–111)
Creatinine, Ser: 0.62 mg/dL (ref 0.61–1.24)
GFR, Estimated: >60 mL/min (ref >60)
Glucose, Bld: 168 mg/dL — ABNORMAL HIGH (ref 70–99)
Potassium: 4.3 mmol/L (ref 3.5–5.1)
Sodium: 139 mmol/L (ref 135–145)
Total Bilirubin: 0.5 mg/dL (ref 0.3–1.2)
Total Protein: 5.9 g/dL — ABNORMAL LOW (ref 6.5–8.1)

## 2020-11-24 LAB — CBC
HCT: 45.3 % (ref 39.0–52.0)
Hemoglobin: 14.3 g/dL (ref 13.0–17.0)
MCH: 28.7 pg (ref 26.0–34.0)
MCHC: 31.6 g/dL (ref 30.0–36.0)
MCV: 90.8 fL (ref 80.0–100.0)
Platelets: 263 10*3/uL (ref 150–400)
RBC: 4.99 MIL/uL (ref 4.22–5.81)
RDW: 16.1 % — ABNORMAL HIGH (ref 11.5–15.5)
WBC: 7.9 10*3/uL (ref 4.0–10.5)
nRBC: 0 % (ref 0.0–0.2)

## 2020-11-24 MED ORDER — APIXABAN 5 MG PO TABS
5.0000 mg | ORAL_TABLET | Freq: Two times a day (BID) | ORAL | Status: DC
  Administered 2020-11-24 – 2020-11-25 (×3): 5 mg via ORAL
  Filled 2020-11-24 (×3): qty 1

## 2020-11-24 MED ORDER — APIXABAN 5 MG PO TABS: 10.0000 mg | ORAL_TABLET | Freq: Two times a day (BID) | ORAL | Status: DC

## 2020-11-24 NOTE — Progress Notes (Signed)
FPTS Interim Progress Note  S: No concerns or complaints.  States he feels he is breathing well on room air.  O: BP 124/82 (BP Location: Left Arm)   Pulse 93   Temp 98 F (36.7 C) (Oral)   Resp 19   Ht '5\' 7"'$  (1.702 m)   Wt 49.9 kg   SpO2 98%   BMI 17.23 kg/m   General: Frail elderly male with right arm contracture breathing comfortably on room air Cardiac: RRR, no murmurs. Respiratory: CTAB, normal effort, breathing well on room air  A/P: Patient has now improved to the point that he is on room air.  He has no complaints or concerns at this time.  Had some soft blood pressures today but blood pressure was cycled while I was in the room and was in the 120s over 80s.  He seems to be getting close to the point of discharge.  Remainder per day team progress note.  Lurline Del, DO 11/24/2020, 7:55 PM PGY-3, [place] Medicine Service pager [phone]

## 2020-11-24 NOTE — Progress Notes (Signed)
   11/24/20 0821  Assess: MEWS Score  Temp 97.8 F (36.6 C)  BP 93/73  Pulse Rate 79  ECG Heart Rate 82  Resp 13  SpO2 100 %  Assess: MEWS Score  MEWS Temp 0  MEWS Systolic 1  MEWS Pulse 0  MEWS RR 1  MEWS LOC 0  MEWS Score 2  MEWS Score Color Yellow

## 2020-11-24 NOTE — Plan of Care (Signed)

## 2020-11-24 NOTE — Progress Notes (Signed)
Family Medicine Teaching Service Daily Progress Note Intern Pager: [phone]  Patient name: Rick Schwartz Medical record number: [id number] Date of birth: 01-24-1941 Age: 80 y.o. Gender: male  Primary Care Provider: Lurline Del, DO Consultants: Palliative care Code Status: DNR/DNI  Pt Overview and Major Events to Date:  9/6: admitted 9/7: weaned from BiPAP 9/8: on [place] 3L   Assessment and Plan:   Rick Schwartz is a 80 year old male presenting with shortness of breath.  PMH is significant for cerebral palsy, hypertension, left foot ulcer, severe protein calorie malnutrition, HFrEF (EF less than 20% on echo from July 2022), prostate cancer (treated with surgery and radiation several years ago), PAD, DVT, PE (recent, on Eliquis), s/p right AKA, and HLD.   Acute hypoxic respiratory failure likely secondary to COVID-19 DDx still includes HF and PE. However, given improvement with treatment thus far, COVID PNA is most likely. This AM, patient able to speak in full sentences and is satting 100% on 3L [place]. Physical exam notable for no signs of volume overload.  -Switch from therapeutic Lovenox to home Eliquis, 5 mg BID.  Continue Covid PNA treatments: -Remdesivir for 5 days for COVID PNA -Decadron until discharge for COVID PNA -Airborne and contact precautions -Tylenol 650 mg every 6 hours as needed for pain   Severe HFrEF Hx of HFrEF with EF less than 20%. No signs of volume overload on exam currently. Soft pressures this AM.  -- Hold Losartan 25 mg daily -- Continue home Lasix 20 mg daily - Continue home KCl 10 mEq/day starting tomorrow - Daily BMP - Continue home metoprolol 12.5 mg daily -Consider addition of Spironolactone once more hemodynamically stable -CTM exam for volume overload -F/u Daily weights -Strict I and O's -Consider HF consult  Oliguria Noted UOP over the past 24 hrs is 200 mL. Patient recently switched to condom cath. Urine may have been lost. 200 mL in bag  currently.  -CTM -BMP tomorrow AM   Hx of PE in July 2022 Patient on long-term Eliquis for previous hospitalization with PE. Caretaker reports full compliance with medication. No objective evidence of DVT on exam.  -Therapeutic Eliquis as above -Monitor for tachycardia -Consider CTPA if condition worsens -Monitor for signs of right heart strain   Left foot ulcer  Hx PAD Pt has chronic left foot ulcer. Pt remains afebrile and WBC is wnl. ABI performed 09/22/20: "Left: Resting left ankle-brachial index indicates severe left lower extremity arterial disease. The left great toe flow is absent." -Wound care consult   Severe protein calorie malnutrition Recs per RD -Ensure 3x daily in between meals -MVI   HLD  Last lipid panel performed on 09/29/19: Total cholesterol: 164, Triglycerides 97, HDL 49, LDL 97. Home med: Rosuvastatin '20mg'$  daily -Continue with home dose of Rosuvastatin    Prolonged QTc Per EKG prolonged QT to 568.  -Repeat EKG ordered -Avoid QT prolonging agents  Code Status  GOC Palliative consulted and discussed with patient -FULL code   FEN/GI: heart healthy diet when able, no IVF, will monitor PO intake PPx: on therapeutic Eliquis Dispo: return home with caregiver pending clinical improvement   Subjective:  On assessment this morning, patient is fully alert and oriented. He denies any SoB and is able to repeat back sentences.  ROS negative for CP, N/V, diarrhea.   Objective: Temp:  [97.7 F (36.5 C)-98.2 F (36.8 C)] 97.7 F (36.5 C) (09/08 0400) Pulse Rate:  [83-97] 87 (09/08 0400) Resp:  [14-26] 20 (09/08 0400) BP: (90-121)/(57-101)  90/67 (09/08 0400) SpO2:  [100 %] 100 % (09/08 0400) FiO2 (%):  [40 %] 40 % (09/07 0832) Weight:  [49.9 kg] 49.9 kg (09/08 0500) Physical Exam Vitals reviewed.  Constitutional:      Comments: Contracture of right arm   Cardiovascular:     Rate and Rhythm: Normal rate and regular rhythm.     Heart sounds: No murmur  heard. Pulmonary:     Effort: Pulmonary effort is normal.     Breath sounds: Normal breath sounds.  Neurological:     Mental Status: He is alert.    Laboratory: Recent Labs  Lab 11/22/20 1414 11/22/20 1819 11/23/20 0516 11/24/20 0203  WBC 6.2  --  8.6 7.9  HGB 15.7 16.7 15.5 14.3  HCT 49.4 49.0 50.3 45.3  PLT 225  --  234 263   Recent Labs  Lab 11/22/20 1656 11/22/20 1819 11/23/20 0516 11/24/20 0203  NA 140 145 142 139  K 4.0 3.4* 3.7 4.3  CL 110  --  109 108  CO2 20*  --  23 23  BUN 9  --  10 21  CREATININE 0.70  --  0.60* 0.62  CALCIUM 9.3  --  9.0 9.2  PROT  --   --  6.2* 5.9*  BILITOT  --   --  0.7 0.5  ALKPHOS  --   --  47 42  ALT  --   --  8 9  AST  --   --  12* 16  GLUCOSE 117*  --  151* 168*    Imaging/Diagnostic Tests: None new  Corky Sox PGY-1, Psychiatry FPTS Intern pager: [phone], text pages welcome

## 2020-11-24 NOTE — Progress Notes (Signed)
Pt resting with stable VS. No bipap needed at this time.

## 2020-11-25 LAB — BASIC METABOLIC PANEL
Anion gap: 9 (ref 5–15)
BUN: 20 mg/dL (ref 8–23)
CO2: 25 mmol/L (ref 22–32)
Calcium: 9.5 mg/dL (ref 8.9–10.3)
Chloride: 105 mmol/L (ref 98–111)
Creatinine, Ser: 0.61 mg/dL (ref 0.61–1.24)
GFR, Estimated: >60 mL/min (ref >60)
Glucose, Bld: 150 mg/dL — ABNORMAL HIGH (ref 70–99)
Potassium: 4.3 mmol/L (ref 3.5–5.1)
Sodium: 139 mmol/L (ref 135–145)

## 2020-11-25 MED ORDER — DEXAMETHASONE 6 MG PO TABS: 6.0000 mg | ORAL_TABLET | Freq: Every day | ORAL | 0 refills | Status: AC

## 2020-11-25 MED ORDER — APIXABAN 5 MG PO TABS
5.0000 mg | ORAL_TABLET | Freq: Two times a day (BID) | ORAL | 1 refills | Status: DC
Start: 2020-11-25 — End: 2020-12-13

## 2020-11-25 MED ORDER — PREDNISONE 10 MG PO TABS: 40.0000 mg | ORAL_TABLET | Freq: Every day | ORAL | 0 refills | Status: DC

## 2020-11-25 NOTE — Discharge Instructions (Addendum)
It was a pleasure taking care of you while you were in the hospital! While here, you were treated for COVID-19 pneumonia.  If you have shortness of breath or trouble breathing at home, please go to the emergency department or call 911 to be evaluated immediately If you cannot eat or drink for 8-10 hours, please call our office or go to emergency department as you may need IV fluids You are out of the quarantine window per CDC guidelines, as your first day of illness was 9/1. We recommend you wear a mask while feeling symptomatic, and since you are high risk for severe illness, wear a mask to protect yourself in public. You should also consider a bivalent booster as this now has coverage for Omicron variant. Follow up with the Family Medicine office on 12/05/20 at 9:30 AM Please take decadron daily until 9/15.

## 2020-11-25 NOTE — TOC Transition Note (Addendum)
Transition of Care [facility]) - CM/SW Discharge Note   Patient Details  Name: Rick Schwartz MRN: [id number] Date of Birth: 02-02-1941  [facility] CM/SW Contact:  Verdell Carmine, RN Phone Number: 11/25/2020, 4:19 PM   Clinical Narrative:     Patient is to be DC home today, Followed by [facility] [place]. Chrislyn Edison Pace of [facility] aware he is discharging. No HH ordered.  Patient is good to DC Called sister whom he lives with. She states she has no needs and can pick him up in a hour. Rn messaged.  Final next level of care: Home/Self Care Barriers to Discharge: No Barriers Identified   Patient Goals and CMS Choice    Home with OP Palliative care    Discharge Placement                       Discharge Plan and Services                          HH Arranged:  (op palliative care, by [facility])          Social Determinants of Health (SDOH) Interventions     Readmission Risk Interventions No flowsheet data found.

## 2020-11-25 NOTE — Progress Notes (Addendum)
Patient's nurse messaged me with concerns regarding drooping on the right side of the face. I went with Dr. Vanessa  to bedside and evaluated the patient. He notes that he has a chronic droop in his face likely secondary to how he sleeps and rests in bed. When evaluating the patient he was fully alert and conversing with [place] normally. On physical exam, PERRL and no neurologic deficits. He has multiple contractures on the right side of his body that limited the strength exam, strength of LUE was 5/5. Droop mostly resolves when smiling and sitting upright. Pt reports that he always has a droop on that side of the face. The drooping in his face is chronic and pt is neurologically intact. No need for further work up at this time with no concern for acute cause.

## 2020-11-25 NOTE — Discharge Summary (Addendum)
[facility] Discharge Summary  Patient name: Rick Schwartz Medical record number: [id number] Date of birth: 03/25/1940 Age: 80 y.o. Gender: male Date of Admission: 11/22/2020  Date of Discharge: 11/25/2020 Admitting Physician: Zenia Resides, MD  Primary Care Provider: Lurline Del, DO Consultants: Palliative care  Indication for Hospitalization: Covid PNA  Discharge Diagnoses/Problem List:  Acute hypoxic respiratory failure 2/2 Covid-19 Hx of PE HFrEF L foot ulcer Malnutrition HLD Prolonged Qtc Goals of care  Disposition: home  Discharge Condition: stable  Discharge Exam:  Physical Exam Vitals reviewed.  Cardiovascular:     Rate and Rhythm: Normal rate and regular rhythm.     Heart sounds: No murmur heard. Pulmonary:     Effort: Pulmonary effort is normal.     Breath sounds: Normal breath sounds.  Abdominal:     General: Bowel sounds are normal.     Palpations: Abdomen is soft.     Tenderness: There is no abdominal tenderness.  Neurological:     Mental Status: He is alert.     Brief Hospital Course:  Acute hypoxic respiratory failure likely due to COVID-19: Symptoms (mainly SoB) started 5 days prior to the [place] on 9/6.  Was started on BiPAP in the emergency department.  CXR showed a potential area of consolidation in the RLL, no evidence of volume overload. Patient's BNP was approximately 1,000, but this was also found on a previous hospitalization for a PE.  Was started on remdesivir and Decadron and continued on his home Eliquis, after one day of therapeutic Lovenox (Pt had to be NPO due to BiPAP). He was weaned off BiPAP on 9/7. On 9/8 he was moved to room air and continued to have appropriate oxygen saturations. ON 9/9, after more than 24 hours on room air, the patient was discharged. He was discharged after finishing a 4 day course of Remdesivir. He was on day 4/10 of decadron, he was discharged home on decadron which he was  instructed to take for 6 more days after discharge.  History of pulmonary embolism in July 2022 and DVT in May 2022: Patient reports being compliant with his Eliquis in the outpatient setting.  He was continued on this in the inpatient setting. Low concern for new PE on this admission. Eliquis briefly held and lovenox started due to being on BiPAP for a day, soon after patient transitioned to high flow nasal canula, eliquis restarted. Discharged on home eliquis.   Severe HFrEF Hx HFrEF (EF less than 20% on echo from July 2022), and a BNP of 1072.  Home meds include losartan 25 mg daily and metoprolol 12.5 mg daily and KCl 10 mEq/day. His losartan was held at one point for soft blood pressures. He was continued on his home medications. He did not appear volume overloaded at any point during this admission.   Left foot ulcer  Hx PAD Pt has chronic left foot ulcer. Pt remains afebrile and WBC is wnl. ABI performed 09/22/20: "Left: Resting left ankle-brachial index indicates severe left lower extremity arterial disease. The left great toe flow is absent." No changes this hospitalization.   Severe protein calorie malnutrition Continued with feeding with assist, prosource plus feeding supplement.   HLD  Last lipid panel performed on 09/29/19: Total cholesterol: 164, Triglycerides 97, HDL 49, LDL 97. Home med: Rosuvastatin '20mg'$  daily, continued.    Prolonged QTc Per EKG prolonged QT to 568. QT prolonging agents were avoided.   Code Status  GOC Pt initially expressed desire  to be full code. Palliative team consulted. After discussion with palliative, patient expressed a desire to be DNR/DNI.     Issues for Follow Up:  Follow up respiratory status with pulse ox measurement at next PCP visit. 2. Consider addition of spironolactone for HF if hemodynamically appropriate, will also help him retain K. 3. Continued BP management, reassess and make changes as appropriate.  4. Continued management of  chronic health conditions 5. Avoid QTc prolonging medications.  Significant Procedures: none  Significant Labs and Imaging:  Recent Labs  Lab 11/22/20 1414 11/22/20 1819 11/23/20 0516 11/24/20 0203  WBC 6.2  --  8.6 7.9  HGB 15.7 16.7 15.5 14.3  HCT 49.4 49.0 50.3 45.3  PLT 225  --  234 263   Recent Labs  Lab 11/22/20 1656 11/22/20 1819 11/23/20 0516 11/24/20 0203 11/25/20 0359  NA 140 145 142 139 139  K 4.0 3.4* 3.7 4.3 4.3  CL 110  --  109 108 105  CO2 20*  --  '23 23 25  '$ GLUCOSE 117*  --  151* 168* 150*  BUN 9  --  '10 21 20  '$ CREATININE 0.70  --  0.60* 0.62 0.61  CALCIUM 9.3  --  9.0 9.2 9.5  ALKPHOS  --   --  47 42  --   AST  --   --  12* 16  --   ALT  --   --  8 9  --   ALBUMIN  --   --  2.5* 2.4*  --     Results/Tests Pending at Time of Discharge: none  Discharge Medications:  Allergies as of 11/25/2020   No Known Allergies      Medication List     TAKE these medications    acetaminophen 325 MG tablet Commonly known as: TYLENOL Take 2 tablets (650 mg total) by mouth every 6 (six) hours. What changed:  when to take this reasons to take this   apixaban 5 MG Tabs tablet Commonly known as: ELIQUIS Take 1 tablet (5 mg total) by mouth 2 (two) times daily. What changed: Another medication with the same name was removed. Continue taking this medication, and follow the directions you see here.   dexamethasone 6 MG tablet Commonly known as: DECADRON Take 1 tablet (6 mg total) by mouth daily for 6 days. Start taking on: November 26, 2020   ENSURE HIGH PROTEIN PO Take 237 mLs by mouth in the morning and at bedtime. vanilla   furosemide 20 MG tablet Commonly known as: LASIX Take 1 tablet (20 mg total) by mouth daily.   losartan 25 MG tablet Commonly known as: COZAAR Take 1 tablet (25 mg total) by mouth daily.   metoprolol succinate 25 MG 24 hr tablet Commonly known as: TOPROL-XL Take 0.5 tablets (12.5 mg total) by mouth daily.   MUCINEX DM  PO Take 1 tablet by mouth 2 (two) times daily as needed (cough).   OVER THE COUNTER MEDICATION Apply 1 application topically See admin instructions. Medi-honey right heel every 3 days   potassium chloride 10 MEQ CR capsule Commonly known as: MICRO-K Take 10 mEq by mouth daily.   rosuvastatin 20 MG tablet Commonly known as: Crestor Take 1 tablet (20 mg total) by mouth daily.        Discharge Instructions: Please refer to Patient Instructions section of EMR for full details.  Patient was counseled important signs and symptoms that should prompt return to medical care, changes in medications, dietary instructions,  activity restrictions, and follow up appointments.   Follow-Up Appointments:  Follow-up Information     [facility]. Go on 12/05/2020.   Why: Please arrive 15 minutes early for a 9:30 AM appointment Contact information: [facility] [place] PGY-1, Family Medicine  I was personally present and performed or re-performed the history, physical exam and medical decision making activities of this service and have verified that the service and findings are accurately documented in the resident's note.  Donney Dice, DO                  11/25/2020, 3:21 PM  PGY-2, [place]

## 2020-11-25 NOTE — Progress Notes (Signed)
Rick Schwartz to be D/C'd Home per MD order.  Discussed with the patient and all questions fully answered.  VSS, Skin clean, dry and intact without evidence of skin break down, no evidence of skin tears noted. IV catheter discontinued intact. Site without signs and symptoms of complications. Dressing and pressure applied.  An After Visit Summary was printed and given to the patient. Patient received prescription.  D/c education completed with patient/family including follow up instructions, medication list, d/c activities limitations if indicated, with other d/c instructions as indicated by MD - patient able to verbalize understanding, all questions fully answered.   Patient instructed to return to ED, call 911, or call MD for any changes in condition.   Patient escorted via [place], and D/C home via private auto.  [place] 11/25/2020 7:33 PM 3

## 2020-12-03 ENCOUNTER — Other Ambulatory Visit: Payer: Self-pay | Admitting: Family Medicine

## 2020-12-03 DIAGNOSIS — E876 Hypokalemia: Secondary | ICD-10-CM

## 2020-12-05 ENCOUNTER — Inpatient Hospital Stay: Payer: Medicare Other

## 2020-12-07 ENCOUNTER — Telehealth: Payer: Self-pay

## 2020-12-07 NOTE — Telephone Encounter (Signed)
Phone call placed to patient's sister, Andru Genter, to check in on patient. Katy Apo shared that patient is staying with another sister, Mardene Celeste. VM previously left for Mardene Celeste.

## 2020-12-07 NOTE — Telephone Encounter (Signed)
Phone call placed to patient/sister to check in and offer to schedule visit with Palliative care. VM left with call back information

## 2020-12-12 NOTE — Progress Notes (Signed)
    SUBJECTIVE:   CHIEF COMPLAINT / HPI:   Hospital follow-up: Patient recently admitted to the hospital with acute hypoxic respiratory failure likely secondary to COVID-19 infection.  Had no oxygen use at baseline but needed nasal cannula and BiPAP during the hospitalization.  At time of discharge he was back on room air.  Discharge summary recommendations: -Measure pulse ox at visit today -Consider spironolactone for heart failure if hemodynamically appropriate -Monitor blood pressure  PERTINENT  PMH / PSH: History of HFrEF and cerebral palsy  OBJECTIVE:   BP 112/80   Pulse 94   SpO2 99%    General: NAD, pleasant, seated in wheelchair with some contractures present and previous right lower extremity amputation Cardiac: S1, S2, RRR, no murmurs. Respiratory: Clear to auscultation bilaterally, normal effort, no wheezing, rales, rhonchi Abdomen: Bowel sounds present, nontender, nondistended Extremities: Previous right amputation, no swelling of the left lower extremity Skin: warm and dry, no rashes noted Neuro: alert, no obvious focal deficits Psych: Normal affect and mood  ASSESSMENT/PLAN:   Hospital follow-up: Acute hypoxic respiratory failure in setting of COVID-19: O2 sat 99%, lungs clear to auscultation today.  Patient with no acute concerns at this time.  Request refills on all of his medications for 90-day supply.  They did ask for options to help reduce the risk of acute toxic respiratory failure in the future for him and I discussed that an incentive spirometer would not be a bad idea to ensure that he is taking good adequate breaths to help keep his lungs clear.  They do plan to get 1 of these as they have a nurse friend that can assist them with this.  We will check a BMP today to ensure that he is taking an appropriate amount of potassium with the medications that he is on.  His blood pressure remains soft at 112/80.  Can consider adding spironolactone per the discharge  summary if his pressure gets a little bit higher but I would be hesitant to do that at this time.   Lurline Del, [place]

## 2020-12-13 ENCOUNTER — Other Ambulatory Visit: Payer: Self-pay

## 2020-12-13 ENCOUNTER — Ambulatory Visit (INDEPENDENT_AMBULATORY_CARE_PROVIDER_SITE_OTHER): Payer: Medicare Other | Admitting: Family Medicine

## 2020-12-13 VITALS — BP 112/80 | HR 94

## 2020-12-13 DIAGNOSIS — I10 Essential (primary) hypertension: Secondary | ICD-10-CM | POA: Diagnosis not present

## 2020-12-13 MED ORDER — FUROSEMIDE 20 MG PO TABS: 20.0000 mg | ORAL_TABLET | Freq: Every day | ORAL | 2 refills | Status: DC

## 2020-12-13 MED ORDER — ROSUVASTATIN CALCIUM 20 MG PO TABS: 20.0000 mg | ORAL_TABLET | Freq: Every day | ORAL | 1 refills | Status: DC

## 2020-12-13 MED ORDER — POTASSIUM CHLORIDE ER 10 MEQ PO CPCR: 10.0000 meq | ORAL_CAPSULE | Freq: Every day | ORAL | 1 refills | Status: DC

## 2020-12-13 MED ORDER — LOSARTAN POTASSIUM 25 MG PO TABS: 25.0000 mg | ORAL_TABLET | Freq: Every day | ORAL | 2 refills | Status: DC

## 2020-12-13 MED ORDER — METOPROLOL SUCCINATE ER 25 MG PO TB24: 12.5000 mg | ORAL_TABLET | Freq: Every day | ORAL | 2 refills | Status: DC

## 2020-12-13 MED ORDER — APIXABAN 5 MG PO TABS: 5.0000 mg | ORAL_TABLET | Freq: Two times a day (BID) | ORAL | 2 refills | Status: DC

## 2020-12-13 NOTE — Patient Instructions (Signed)
We are going check some blood work today to see your potassium level.  I will call you with the result probably tomorrow.  I have sent in refills of all of your medications in a 90-day supply.  I would like for you to get an incentive spirometer to use for your breathing.  I think this will help you keep your lungs clear.  If you develop any shortness of breath, worsening cough, or other concerns please not hesitate to return.

## 2020-12-14 ENCOUNTER — Other Ambulatory Visit: Payer: Self-pay | Admitting: Family Medicine

## 2020-12-14 DIAGNOSIS — E876 Hypokalemia: Secondary | ICD-10-CM

## 2020-12-14 DIAGNOSIS — I10 Essential (primary) hypertension: Secondary | ICD-10-CM

## 2020-12-14 LAB — BASIC METABOLIC PANEL
BUN/Creatinine Ratio: 25 — ABNORMAL HIGH (ref 10–24)
BUN: 13 mg/dL (ref 8–27)
CO2: 20 mmol/L (ref 20–29)
Calcium: 9 mg/dL (ref 8.6–10.2)
Chloride: 108 mmol/L — ABNORMAL HIGH (ref 96–106)
Creatinine, Ser: 0.53 mg/dL — ABNORMAL LOW (ref 0.76–1.27)
Glucose: 111 mg/dL — ABNORMAL HIGH (ref 70–99)
Potassium: 3.5 mmol/L (ref 3.5–5.2)
Sodium: 144 mmol/L (ref 134–144)
eGFR: 102 mL/min/{1.73_m2} (ref >59)

## 2021-01-04 ENCOUNTER — Telehealth: Payer: Self-pay

## 2021-01-04 NOTE — Telephone Encounter (Signed)
Attempted to contact patient's [name] to schedule a Palliative Care follow up appointment. No answer left a message to return call.

## 2021-01-20 ENCOUNTER — Emergency Department (HOSPITAL_COMMUNITY): Payer: Medicare Other

## 2021-01-20 ENCOUNTER — Encounter (HOSPITAL_COMMUNITY): Payer: Self-pay | Admitting: Emergency Medicine

## 2021-01-20 ENCOUNTER — Inpatient Hospital Stay (HOSPITAL_COMMUNITY)
Admission: EM | Admit: 2021-01-20 | Discharge: 2021-01-25 | DRG: 871 | Disposition: A | Discharge status: Home / Self Care | Payer: Medicare Other | Attending: Family Medicine | Admitting: Family Medicine

## 2021-01-20 ENCOUNTER — Telehealth: Payer: Self-pay

## 2021-01-20 ENCOUNTER — Other Ambulatory Visit: Payer: Self-pay

## 2021-01-20 DIAGNOSIS — I2699 Other pulmonary embolism without acute cor pulmonale: Secondary | ICD-10-CM | POA: Diagnosis present

## 2021-01-20 DIAGNOSIS — Z993 Dependence on wheelchair: Secondary | ICD-10-CM

## 2021-01-20 DIAGNOSIS — R791 Abnormal coagulation profile: Secondary | ICD-10-CM | POA: Diagnosis present

## 2021-01-20 DIAGNOSIS — Z8546 Personal history of malignant neoplasm of prostate: Secondary | ICD-10-CM

## 2021-01-20 DIAGNOSIS — R34 Anuria and oliguria: Secondary | ICD-10-CM | POA: Diagnosis present

## 2021-01-20 DIAGNOSIS — E7800 Pure hypercholesterolemia, unspecified: Secondary | ICD-10-CM | POA: Diagnosis present

## 2021-01-20 DIAGNOSIS — G800 Spastic quadriplegic cerebral palsy: Secondary | ICD-10-CM | POA: Diagnosis present

## 2021-01-20 DIAGNOSIS — Z89611 Acquired absence of right leg above knee: Secondary | ICD-10-CM

## 2021-01-20 DIAGNOSIS — Z79899 Other long term (current) drug therapy: Secondary | ICD-10-CM

## 2021-01-20 DIAGNOSIS — A419 Sepsis, unspecified organism: Principal | ICD-10-CM

## 2021-01-20 DIAGNOSIS — Z681 Body mass index (BMI) 19 or less, adult: Secondary | ICD-10-CM | POA: Diagnosis not present

## 2021-01-20 DIAGNOSIS — R1312 Dysphagia, oropharyngeal phase: Secondary | ICD-10-CM | POA: Diagnosis present

## 2021-01-20 DIAGNOSIS — J690 Pneumonitis due to inhalation of food and vomit: Secondary | ICD-10-CM | POA: Diagnosis present

## 2021-01-20 DIAGNOSIS — E46 Unspecified protein-calorie malnutrition: Secondary | ICD-10-CM | POA: Diagnosis present

## 2021-01-20 DIAGNOSIS — I472 Ventricular tachycardia, unspecified: Secondary | ICD-10-CM | POA: Diagnosis present

## 2021-01-20 DIAGNOSIS — R7303 Prediabetes: Secondary | ICD-10-CM | POA: Diagnosis present

## 2021-01-20 DIAGNOSIS — F0390 Unspecified dementia without behavioral disturbance: Secondary | ICD-10-CM | POA: Diagnosis present

## 2021-01-20 DIAGNOSIS — I5022 Chronic systolic (congestive) heart failure: Secondary | ICD-10-CM | POA: Diagnosis present

## 2021-01-20 DIAGNOSIS — J189 Pneumonia, unspecified organism: Secondary | ICD-10-CM | POA: Diagnosis present

## 2021-01-20 DIAGNOSIS — E43 Unspecified severe protein-calorie malnutrition: Secondary | ICD-10-CM | POA: Diagnosis present

## 2021-01-20 DIAGNOSIS — I110 Hypertensive heart disease with heart failure: Secondary | ICD-10-CM | POA: Diagnosis present

## 2021-01-20 DIAGNOSIS — Z8349 Family history of other endocrine, nutritional and metabolic diseases: Secondary | ICD-10-CM

## 2021-01-20 DIAGNOSIS — Z833 Family history of diabetes mellitus: Secondary | ICD-10-CM

## 2021-01-20 DIAGNOSIS — Z8616 Personal history of COVID-19: Secondary | ICD-10-CM | POA: Diagnosis not present

## 2021-01-20 DIAGNOSIS — L89621 Pressure ulcer of left heel, stage 1: Secondary | ICD-10-CM | POA: Diagnosis present

## 2021-01-20 DIAGNOSIS — N400 Enlarged prostate without lower urinary tract symptoms: Secondary | ICD-10-CM | POA: Diagnosis present

## 2021-01-20 DIAGNOSIS — Z20822 Contact with and (suspected) exposure to covid-19: Secondary | ICD-10-CM | POA: Diagnosis present

## 2021-01-20 DIAGNOSIS — I70201 Unspecified atherosclerosis of native arteries of extremities, right leg: Secondary | ICD-10-CM | POA: Diagnosis present

## 2021-01-20 DIAGNOSIS — E876 Hypokalemia: Secondary | ICD-10-CM | POA: Diagnosis not present

## 2021-01-20 DIAGNOSIS — R6520 Severe sepsis without septic shock: Secondary | ICD-10-CM | POA: Diagnosis present

## 2021-01-20 DIAGNOSIS — Z8249 Family history of ischemic heart disease and other diseases of the circulatory system: Secondary | ICD-10-CM

## 2021-01-20 DIAGNOSIS — Z86711 Personal history of pulmonary embolism: Secondary | ICD-10-CM

## 2021-01-20 DIAGNOSIS — R739 Hyperglycemia, unspecified: Secondary | ICD-10-CM | POA: Diagnosis present

## 2021-01-20 DIAGNOSIS — Z87891 Personal history of nicotine dependence: Secondary | ICD-10-CM

## 2021-01-20 DIAGNOSIS — Z86718 Personal history of other venous thrombosis and embolism: Secondary | ICD-10-CM

## 2021-01-20 DIAGNOSIS — Z7901 Long term (current) use of anticoagulants: Secondary | ICD-10-CM

## 2021-01-20 HISTORY — DX: Pneumonitis due to inhalation of food and vomit: J69.0

## 2021-01-20 LAB — MRSA NEXT GEN BY PCR, NASAL: MRSA by PCR Next Gen: DETECTED — AB

## 2021-01-20 LAB — COMPREHENSIVE METABOLIC PANEL
ALT: 10 U/L (ref 0–44)
AST: 18 U/L (ref 15–41)
Albumin: 3.1 g/dL — ABNORMAL LOW (ref 3.5–5.0)
Alkaline Phosphatase: 63 U/L (ref 38–126)
Anion gap: 9 (ref 5–15)
BUN: 8 mg/dL (ref 8–23)
CO2: 21 mmol/L — ABNORMAL LOW (ref 22–32)
Calcium: 8.9 mg/dL (ref 8.9–10.3)
Chloride: 110 mmol/L (ref 98–111)
Creatinine, Ser: 0.68 mg/dL (ref 0.61–1.24)
GFR, Estimated: >60 mL/min (ref >60)
Glucose, Bld: 148 mg/dL — ABNORMAL HIGH (ref 70–99)
Potassium: 3.6 mmol/L (ref 3.5–5.1)
Sodium: 140 mmol/L (ref 135–145)
Total Bilirubin: 1.6 mg/dL — ABNORMAL HIGH (ref 0.3–1.2)
Total Protein: 6.8 g/dL (ref 6.5–8.1)

## 2021-01-20 LAB — CBC WITH DIFFERENTIAL/PLATELET
Abs Immature Granulocytes: 0.02 10*3/uL (ref 0.00–0.07)
Basophils Absolute: 0.1 10*3/uL (ref 0.0–0.1)
Basophils Relative: 1 %
Eosinophils Absolute: 0.4 10*3/uL (ref 0.0–0.5)
Eosinophils Relative: 5 %
HCT: 53 % — ABNORMAL HIGH (ref 39.0–52.0)
Hemoglobin: 17 g/dL (ref 13.0–17.0)
Immature Granulocytes: 0 %
Lymphocytes Relative: 14 %
Lymphs Abs: 1 10*3/uL (ref 0.7–4.0)
MCH: 29.1 pg (ref 26.0–34.0)
MCHC: 32.1 g/dL (ref 30.0–36.0)
MCV: 90.8 fL (ref 80.0–100.0)
Monocytes Absolute: 0.7 10*3/uL (ref 0.1–1.0)
Monocytes Relative: 10 %
Neutro Abs: 4.9 10*3/uL (ref 1.7–7.7)
Neutrophils Relative %: 70 %
Platelets: 305 10*3/uL (ref 150–400)
RBC: 5.84 MIL/uL — ABNORMAL HIGH (ref 4.22–5.81)
RDW: 14.5 % (ref 11.5–15.5)
WBC: 7.1 10*3/uL (ref 4.0–10.5)
nRBC: 0 % (ref 0.0–0.2)

## 2021-01-20 LAB — RESP PANEL BY RT-PCR (FLU A&B, COVID) ARPGX2
Influenza A by PCR: NEGATIVE
Influenza B by PCR: NEGATIVE
SARS Coronavirus 2 by RT PCR: NEGATIVE

## 2021-01-20 LAB — LIPID PANEL
Cholesterol: 169 mg/dL (ref 0–200)
HDL: 43 mg/dL (ref >40)
LDL Cholesterol: 109 mg/dL — ABNORMAL HIGH (ref 0–99)
Total CHOL/HDL Ratio: 3.9 RATIO
Triglycerides: 86 mg/dL (ref <150)
VLDL: 17 mg/dL (ref 0–40)

## 2021-01-20 LAB — HEMOGLOBIN A1C
Hgb A1c MFr Bld: 5.5 % (ref 4.8–5.6)
Mean Plasma Glucose: 111.15 mg/dL

## 2021-01-20 LAB — APTT: aPTT: 32 seconds (ref 24–36)

## 2021-01-20 LAB — LACTIC ACID, PLASMA
Lactic Acid, Venous: 2.6 mmol/L (ref 0.5–1.9)
Lactic Acid, Venous: 2 mmol/L (ref 0.5–1.9)

## 2021-01-20 LAB — PROTIME-INR
INR: 1.5 — ABNORMAL HIGH (ref 0.8–1.2)
Prothrombin Time: 18.2 seconds — ABNORMAL HIGH (ref 11.4–15.2)

## 2021-01-20 LAB — BRAIN NATRIURETIC PEPTIDE: B Natriuretic Peptide: 1146.9 pg/mL — ABNORMAL HIGH (ref 0.0–100.0)

## 2021-01-20 MED ORDER — MUPIROCIN 2 % EX OINT
1.0000 "application " | TOPICAL_OINTMENT | Freq: Two times a day (BID) | CUTANEOUS | Status: AC
  Administered 2021-01-21 – 2021-01-25 (×10): 1 via NASAL
  Filled 2021-01-20: qty 22

## 2021-01-20 MED ORDER — ROSUVASTATIN CALCIUM 20 MG PO TABS: 20.0000 mg | ORAL_TABLET | Freq: Every day | ORAL | Status: DC

## 2021-01-20 MED ORDER — SODIUM CHLORIDE 0.9 % IV BOLUS
1000.0000 mL | Freq: Once | INTRAVENOUS | Status: AC
  Administered 2021-01-20: 1000 mL via INTRAVENOUS

## 2021-01-20 MED ORDER — ACETAMINOPHEN 650 MG RE SUPP
650.0000 mg | Freq: Once | RECTAL | Status: AC
  Administered 2021-01-20: 650 mg via RECTAL
  Filled 2021-01-20: qty 1

## 2021-01-20 MED ORDER — GUAIFENESIN 100 MG/5ML PO LIQD
100.0000 mg | ORAL | Status: DC | PRN
  Administered 2021-01-23 – 2021-01-25 (×2): 100 mg via ORAL
  Filled 2021-01-20 (×2): qty 5

## 2021-01-20 MED ORDER — SODIUM CHLORIDE 0.9 % IV SOLN
500.0000 mg | INTRAVENOUS | Status: DC
  Administered 2021-01-20: 500 mg via INTRAVENOUS
  Filled 2021-01-20: qty 500

## 2021-01-20 MED ORDER — ROSUVASTATIN CALCIUM 20 MG PO TABS
20.0000 mg | ORAL_TABLET | Freq: Every day | ORAL | Status: DC
  Administered 2021-01-21 – 2021-01-25 (×4): 20 mg via ORAL
  Filled 2021-01-20 (×4): qty 1

## 2021-01-20 MED ORDER — LACTATED RINGERS IV SOLN: INTRAVENOUS | Status: DC

## 2021-01-20 MED ORDER — SODIUM CHLORIDE 0.9 % IV SOLN
2.0000 g | INTRAVENOUS | Status: DC
  Administered 2021-01-20: 2 g via INTRAVENOUS
  Filled 2021-01-20: qty 20

## 2021-01-20 MED ORDER — ACETAMINOPHEN 500 MG PO TABS: 1000.0000 mg | ORAL_TABLET | Freq: Once | ORAL | Status: DC

## 2021-01-20 MED ORDER — APIXABAN 5 MG PO TABS
5.0000 mg | ORAL_TABLET | Freq: Two times a day (BID) | ORAL | Status: DC
  Administered 2021-01-20 – 2021-01-25 (×10): 5 mg via ORAL
  Filled 2021-01-20 (×10): qty 1

## 2021-01-20 MED ORDER — IPRATROPIUM-ALBUTEROL 0.5-2.5 (3) MG/3ML IN SOLN
3.0000 mL | RESPIRATORY_TRACT | Status: DC | PRN
  Administered 2021-01-23: 3 mL via RESPIRATORY_TRACT
  Filled 2021-01-20 (×2): qty 3

## 2021-01-20 MED ORDER — CHLORHEXIDINE GLUCONATE CLOTH 2 % EX PADS
6.0000 | MEDICATED_PAD | Freq: Every day | CUTANEOUS | Status: DC
  Administered 2021-01-21 – 2021-01-24 (×4): 6 via TOPICAL

## 2021-01-20 MED ORDER — IPRATROPIUM-ALBUTEROL 0.5-2.5 (3) MG/3ML IN SOLN
RESPIRATORY_TRACT | Status: AC
  Administered 2021-01-20: 3 mL
  Filled 2021-01-20: qty 3

## 2021-01-20 NOTE — ED Provider Notes (Signed)
Emergency Medicine Provider Triage Evaluation Note  Rick Schwartz , a 80 y.o. male  was evaluated in triage.  Pt complains of shortness of breath and cough gradual worsening for the past week..  Granddaughter is in the room with him and reports that the patient has had trouble swallowing and is getting choked more often with food.  Recently admitted in September for pneumonia.  Review of Systems  Positive: Shortness of breath, cough Negative: Chest pain, nasal congestion, rhinorrhea, sore throat  Physical Exam  BP (!) 152/130 (BP Location: Left Arm)   Pulse (!) 53   Temp 98.3 F (36.8 C) (Oral)   Resp 18   SpO2 99%  Gen:   Awake, no distress   Resp:  Tachypneic, coarse rhonchi heard in bilateral lower lobes. MSK:   Contracture of the right upper extremity.  AKA of the right lower. Other:  Bradycardic  Medical Decision Making  Medically screening exam initiated at 11:22 AM.  Appropriate orders placed.  Sueanne Margarita was informed that the remainder of the evaluation will be completed by another provider, this initial triage assessment does not replace that evaluation, and the importance of remaining in the ED until their evaluation is complete.  Labs and imaging ordered.   Sherrell Puller, PA-C 01/20/21 1124    Daleen Bo, MD 01/21/21 1734

## 2021-01-20 NOTE — Progress Notes (Signed)
Patient passes his bedside swallow evaluation. One Po med with a small sip of water given. Will keep NPO except for meds overnight per verbal orders.

## 2021-01-20 NOTE — H&P (Addendum)
[facility] Admission History and Physical Service Pager: [phone]  Patient name: Rick Schwartz Medical record number: [id number] Date of birth: [birth year] Age: 80 y.o. Gender: male  Primary Care Provider: Lurline Del, DO Consultants: None Code Status: FULL Preferred Emergency Contact: Rick Schwartz, sister, [phone]   Chief Complaint: Shortness of breath.    Assessment and Plan: Rick Schwartz is a 80 y.o. male presenting with shortness of breath . PMH is significant for Spastic Quadriplegia, HFrEF (EF < 20% 7/22), PE on Eliquis, dementia, Prostate Cancer, HLD, HTN, PAD.   Aspiration PNA  Sepsis Patient presents with shortness of breath for the past 3-4 days, with cough and history of choking on food. In the ED his RR was 43, with a temperature of 100.7 and a BP on 103/58. Labs were significant for a lactic acid of 2.6, with slightly elevated PT 18.2, and slightly elevated INR of 1.5, with a K at 3.4, a T. Billi of 1.6. On Imaging, CXR showed minimal diffuse interstitial opacity that may reflect mild edema or infection. On physical exam he was tachypnic, with coarse breath sounds and rhonci. He looked euvolemic with no edema on extremities. Blood cultures were collected, patient was started on ceftriaxone, azithromycin, and given a 1L fluid bolus. Patient may be suffering from aspiration PNA, given CXR and history cerebral palsy and choking on food. Patient could also be suffering from CHF given history of HFrEF and his new respiratory status, but less likely given his exam findings. Patient could also be suffering from a PE given his history of cancer, PE, and tachypnea, however it is less likely as patient says he's compliant on anticoagulation.  -Admit to FPTS, Dr. Gwendlyn Deutscher -Continue CFTX 2 g -Continue Azithromycin 500 mg -Follow up Blood Cx x 2 -Follow up UA / Urine Cx -Monitor respiratory status -legionella Ur Ag   -SLP - swallow  evaluation -BNP -TSH -CBC -CMP -Daily weights -Strict I/O  HFrEF EF < 20% Patient with history of CHF, Last echo in July of 22 showed < 20% EF. Patient taking lasix 20 mg, losartan 25 mg, metoprolol succinate 25 mg at home. Patient Euvolemic on exam today. -Holding home antihypertensives  -Restart home Lasix when able  -Monitor I/Os  -Daily weights -Follow up risk stratification labs   Pulmonary Embolus on Eliquis History of PE 7/22 and DVT 4/22 on Eliquis. Reports compliance with Eliquis.  -Continue apixaban  Prediabetes Last A1c 7/21 was 5.6. Glucose on admission 148.  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 States he does not follow with neurology.  -PT/OT    HLD Continue Crestor 20 mg.  - Continue home medication   HTN On metoprolol succinate, and losartan. BP on admission 136/66-103/58, getting fluids 1 L bolus of fluids. 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  -reach out to pt's sister for update on CODE status  -default to FULL CODE for now   FEN/GI: NPO Prophylaxis: Apixaban  Disposition: Admit to progressive   History of Present Illness:  Rick Schwartz is a 80 y.o. male presenting with worsening cough and shortness of breath over the last couple of days. Has not choked on  anything in the last couple of days, but family member reports that he has, per chart review. Has used cough drops with some short term relief. About a couple months ago had similar sx. Denies fever, chest pain, flank pain, abdominal pain and rhinorrhea.   Quit smoking about 20 years ago. Lives with his sister who gives him his medications.   Review  Of Systems: Per HPI with the following additions:   Review of Systems  Constitutional:  Negative for fatigue and fever.  HENT:  Negative for rhinorrhea.   Respiratory:  Positive for cough and shortness of breath.   Cardiovascular:  Negative for chest pain.  Gastrointestinal:  Negative for abdominal pain, diarrhea and vomiting.  Genitourinary:  Negative for flank pain.  Musculoskeletal:  Negative for back pain.  Neurological:  Negative for headaches.    Patient Active Problem List   Diagnosis Date Noted   Acute respiratory failure with hypoxemia (HCC)    Spastic quadriplegic cerebral palsy ([place])    COVID-19 11/22/2020   Chronic HFrEF (heart failure with reduced ejection fraction) (HCC)    Dementia without behavioral disturbance ([place])    Pressure injury of skin 09/22/2020   Protein-calorie malnutrition, severe 09/22/2020   Right pulmonary embolus ([place]) 09/21/2020   Pulmonary embolus ([place]) 09/21/2020   Shortness of breath    Pleural effusion, bilateral    SBO (small bowel obstruction) ([place])    Inanition ([place])    Foot ulcer with necrosis of muscle, right ([place]) 07/18/2020   Severe sepsis ([place]) 07/18/2020   Lactic acidosis 07/18/2020   Hypernatremia 07/18/2020   Hypercalcemia 07/18/2020   Protein-calorie malnutrition ([place])    Gangrene of right foot ([place]) 07/17/2020   Focal motor deficit 07/07/2020   Physical deconditioning 05/06/2020   Prediabetes 07/20/2016   Cough 01/21/2012   HALLUX RIGIDUS, ACQUIRED 04/11/2010   PROSTATE CANCER 09/18/2007   GLUCOSE INTOLERANCE 05/16/2006   HYPERCHOLESTEROLEMIA 05/16/2006   Infantile cerebral palsy ([place]) 05/16/2006   HYPERTENSION, BENIGN SYSTEMIC 05/16/2006   BPH 05/16/2006    Past Medical History: Past Medical History:  Diagnosis Date   Cancer ([place])    Prostate   Cerebral palsy ([place])    Hyperlipidemia    Hypertension    Peripheral arterial disease ([place])    critical limb ischemia    Past Surgical History: Past Surgical  History:  Procedure Laterality Date   AMPUTATION Right 07/22/2020   Procedure: RIGHT ABOVE KNEE AMPUTATION;  Surgeon: [name], MD;  Location: [facility];  Service: Orthopedics;  Laterality: Right;   PROSTATE SURGERY      Social History: Social History   Tobacco Use   Smoking status: Former    Packs/day: 0.25    Years: 35.00    Pack years: 8.75    Types: Cigarettes    Quit date: 10/12/1994    Years since quitting: 26.2   Smokeless tobacco: Never  Vaping Use   Vaping Use: Never used  Substance Use Topics   Alcohol use: Yes    Comment: Occasional beer and gin   Drug use: No   Additional social history:   Please also refer to relevant sections of EMR.  Family History: Family History  Problem Relation Age of Onset   Diabetes Mother    Hypertension Mother    Hypertension Father    Diabetes Sister    Hypertension Sister    Diabetes Brother    Hypertension Brother    Diabetes Sister    Thyroid disease Sister    Sudden  death Brother     Allergies and Medications: No Known Allergies No current facility-administered medications on file prior to encounter.   Current Outpatient Medications on File Prior to Encounter  Medication Sig Dispense Refill   acetaminophen (TYLENOL) 325 MG tablet Take 2 tablets (650 mg total) by mouth every 6 (six) hours. (Patient taking differently: Take 650 mg by mouth every 6 (six) hours as needed for mild pain, headache or fever.)     apixaban (ELIQUIS) 5 MG TABS tablet Take 1 tablet (5 mg total) by mouth 2 (two) times daily. 180 tablet 2   Dextromethorphan-guaiFENesin (MUCINEX DM PO) Take 1 tablet by mouth 2 (two) times daily as needed (cough).     furosemide (LASIX) 20 MG tablet Take 1 tablet (20 mg total) by mouth daily. 90 tablet 2   guaiFENesin (TUSSIN) 100 MG/5ML liquid Take 100 mg by mouth every 4 (four) hours as needed for cough or to loosen phlegm.     losartan (COZAAR) 25 MG tablet Take 1 tablet (25 mg total) by mouth daily. 90 tablet 2    metoprolol succinate (TOPROL-XL) 25 MG 24 hr tablet Take 0.5 tablets (12.5 mg total) by mouth daily. 60 tablet 2   potassium chloride (MICRO-K) 10 MEQ CR capsule Take 1 capsule (10 mEq total) by mouth daily. 90 capsule 1   rosuvastatin (CRESTOR) 20 MG tablet Take 1 tablet (20 mg total) by mouth daily. 90 tablet 1    Objective: BP 109/71 (BP Location: Right Arm)   Pulse (!) 103   Temp (!) 100.7 F (38.2 C) (Rectal)   Resp 18   SpO2 100%  Exam: General: Ill appearing, tachypnic, frail, African American male Eyes: PERRL, clear conjunctiva ENTM: Moist mucous membranes Neck: ROM at baseline Cardiovascular: Regularly Irregular rhythm, NRMG Respiratory: Coarse breath sounds, Rhonchi Gastrointestinal: Soft, nttp, non-distended MSK: Spastic contraction of UE, right AKA Derm: No lesions or rashes, left heel ?pressure injury vs dry skin Neuro: Alert and oriented to self and place, speech somewhat unclear Psych: Good affect  Labs and Imaging: CBC BMET  Recent Labs  Lab 01/20/21 1144  WBC 7.1  HGB 17.0  HCT 53.0*  PLT 305   Recent Labs  Lab 01/20/21 1144  NA 140  K 3.6  CL 110  CO2 21*  BUN 8  CREATININE 0.68  GLUCOSE 148*  CALCIUM 8.9      CXR: Minimal diffuse interstitial opacity, similar to prior which may reflect mild edema or infection. No focal airspace opacity.    EKG: My own interpretation, sinus tachycardia with PVC's     Holley Bouche, MD 01/20/2021, 3:57 PM PGY-1, [place] Intern pager: [phone], text pages welcome  FPTS Upper-Level Resident Addendum   I have independently interviewed and examined the patient. I have discussed the above with the original author and agree with their documentation. My edits for correction/addition/clarification are in within the document. Please see also any attending notes.   Lyndee Hensen, DO PGY-3, [facility] 01/20/2021 7:29 PM  [place] Service pager: [phone] (text pages  welcome through [facility])

## 2021-01-20 NOTE — Progress Notes (Signed)
Called Rick Schwartz's sister Rick Schwartz to discuss code status as it was noted in prior hospitalizations he was DNR.   Ms. Rick Schwartz said that patient has paperwork with wishes for DNR code status. I changed his code status in the chart from full code to DNR.  Orvis Brill, DO

## 2021-01-20 NOTE — ED Triage Notes (Signed)
Pt arrived POV from home by grand daughter with shob increased in last two days with emesis after coughing today, s/s beginning earlier in the week, Covid + in Sept. Per gdaughter decreased intake with some choking with feedings, audible rhonchi noted.  Pt A & O. Denies pain, denies fever.

## 2021-01-20 NOTE — ED Provider Notes (Signed)
[facility] EMERGENCY DEPARTMENT Provider Note   CSN: [id number] Arrival date & time: 01/20/21  1056     History Chief Complaint  Patient presents with   Shortness of Breath    Rick Schwartz is a 80 y.o. male.  The history is provided by a relative and medical records. No language interpreter was used.  Shortness of Breath  80 year old male significant history of cerebral palsy, prostate cancer, hypertension, CHF presents ED for evaluation of shortness of breath.  History obtained through niece who is at bedside.  For the past 3 to 4 days patient has had progressive difficulty breathing.  Niece notice pt would choke on his food, and also endorse cough.  She noticed the patient is breathing much faster than usual which concerns her.  No report of any fever or chills no congestion no nausea vomiting diarrhea or trouble urinating.  Patient had COVID 2 months ago.  Remainder of history is limited due to patient's baseline cerebral palsy.  Patient also has history of blood clots in the past currently on Eliquis and have been compliant.   Past Medical History:  Diagnosis Date   Cancer [facility])    Prostate   Cerebral palsy ([place])    Hyperlipidemia    Hypertension    Peripheral arterial disease ([place])    critical limb ischemia    Patient Active Problem List   Diagnosis Date Noted   Acute respiratory failure with hypoxemia ([place])    Spastic quadriplegic cerebral palsy ([place])    COVID-19 11/22/2020   Chronic HFrEF (heart failure with reduced ejection fraction) ([place])    Dementia without behavioral disturbance ([place])    Pressure injury of skin 09/22/2020   Protein-calorie malnutrition, severe 09/22/2020   Right pulmonary embolus ([place]) 09/21/2020   Pulmonary embolus ([place]) 09/21/2020   Shortness of breath    Pleural effusion, bilateral    SBO (small bowel obstruction) ([place])    Inanition ([place])    Foot ulcer with necrosis of muscle, right ([place]) 07/18/2020   Severe sepsis  ([place]) 07/18/2020   Lactic acidosis 07/18/2020   Hypernatremia 07/18/2020   Hypercalcemia 07/18/2020   Protein-calorie malnutrition ([place])    Gangrene of right foot ([place]) 07/17/2020   Focal motor deficit 07/07/2020   Physical deconditioning 05/06/2020   Prediabetes 07/20/2016   Cough 01/21/2012   HALLUX RIGIDUS, ACQUIRED 04/11/2010   PROSTATE CANCER 09/18/2007   GLUCOSE INTOLERANCE 05/16/2006   HYPERCHOLESTEROLEMIA 05/16/2006   Infantile cerebral palsy ([place]) 05/16/2006   HYPERTENSION, BENIGN SYSTEMIC 05/16/2006   BPH 05/16/2006    Past Surgical History:  Procedure Laterality Date   AMPUTATION Right 07/22/2020   Procedure: RIGHT ABOVE KNEE AMPUTATION;  Surgeon: [name], MD;  Location: [place];  Service: Orthopedics;  Laterality: Right;   PROSTATE SURGERY         Family History  Problem Relation Age of Onset   Diabetes Mother    Hypertension Mother    Hypertension Father    Diabetes Sister    Hypertension Sister    Diabetes Brother    Hypertension Brother    Diabetes Sister    Thyroid disease Sister    Sudden death Brother     Social History   Tobacco Use   Smoking status: Former    Packs/day: 0.25    Years: 35.00    Pack years: 8.75    Types: Cigarettes    Quit date: 10/12/1994    Years since quitting: 26.2   Smokeless tobacco: Never  Vaping Use   Vaping Use: Never used  Substance Use Topics   Alcohol use: Yes    Comment: Occasional beer and gin   Drug use: No    Home Medications Prior to Admission medications   Medication Sig Start Date End Date Taking? Authorizing Provider  acetaminophen (TYLENOL) 325 MG tablet Take 2 tablets (650 mg total) by mouth every 6 (six) hours. Patient taking differently: Take 650 mg by mouth every 6 (six) hours as needed for mild pain, headache or fever. 09/26/20   Ezequiel Essex, MD  apixaban (ELIQUIS) 5 MG TABS tablet Take 1 tablet (5 mg total) by mouth 2 (two) times daily. 12/13/20   Lurline Del, DO   Dextromethorphan-guaiFENesin [facility] DM PO) Take 1 tablet by mouth 2 (two) times daily as needed (cough).    [provider]  furosemide (LASIX) 20 MG tablet Take 1 tablet (20 mg total) by mouth daily. 12/13/20   Welborn, Ryan, DO  losartan (COZAAR) 25 MG tablet Take 1 tablet (25 mg total) by mouth daily. 12/13/20   Lurline Del, DO  metoprolol succinate (TOPROL-XL) 25 MG 24 hr tablet Take 0.5 tablets (12.5 mg total) by mouth daily. 12/13/20   Lurline Del, DO  Nutritional Supplements (ENSURE HIGH PROTEIN PO) Take 237 mLs by mouth in the morning and at bedtime. vanilla    [provider]  OVER THE COUNTER MEDICATION Apply 1 application topically See admin instructions. Medi-honey right heel every 3 days    [provider]  potassium chloride (MICRO-K) 10 MEQ CR capsule Take 1 capsule (10 mEq total) by mouth daily. 12/13/20   Lurline Del, DO  rosuvastatin (CRESTOR) 20 MG tablet Take 1 tablet (20 mg total) by mouth daily. 12/13/20   Lurline Del, DO    Allergies    Patient has no known allergies.  Review of Systems   Review of Systems  Respiratory:  Positive for shortness of breath.   All other systems reviewed and are negative.  Physical Exam Updated Vital Signs BP 136/66 (BP Location: Left Arm)   Pulse (!) 103   Temp 98.3 F (36.8 C) (Oral)   Resp (!) 26   SpO2 100%   Physical Exam Vitals and nursing note reviewed.  Constitutional:      Comments: Elderly male with decorticate contracture of right upper extremity, appears tachypneic  HENT:     Head: Atraumatic.     Comments: Lipoma noted to right forehead Eyes:     Conjunctiva/sclera: Conjunctivae normal.  Cardiovascular:     Rate and Rhythm: Tachycardia present.  Pulmonary:     Effort: Tachypnea present.     Breath sounds: No stridor. Decreased breath sounds, rhonchi and rales present. No wheezing.  Chest:     Chest wall: No tenderness.  Musculoskeletal:     Cervical back: Neck supple.      Comments: Right AKA.  Skin:    Findings: No rash.  Neurological:     Mental Status: He is alert.    ED Results / Procedures / Treatments   Labs (all labs ordered are listed, but only abnormal results are displayed) Labs Reviewed  CBC WITH DIFFERENTIAL/PLATELET - Abnormal; Notable for the following components:      Result Value   RBC 5.84 (*)    HCT 53.0 (*)    All other components within normal limits  COMPREHENSIVE METABOLIC PANEL - Abnormal; Notable for the following components:   CO2 21 (*)    Glucose, Bld 148 (*)  Albumin 3.1 (*)    Total Bilirubin 1.6 (*)    All other components within normal limits  LACTIC ACID, PLASMA - Abnormal; Notable for the following components:   Lactic Acid, Venous 2.6 (*)    All other components within normal limits  PROTIME-INR - Abnormal; Notable for the following components:   Prothrombin Time 18.2 (*)    INR 1.5 (*)    All other components within normal limits  RESP PANEL BY RT-PCR (FLU A&B, COVID) ARPGX2  CULTURE, BLOOD (ROUTINE X 2)  CULTURE, BLOOD (ROUTINE X 2)  APTT  URINALYSIS, ROUTINE W REFLEX MICROSCOPIC  LACTIC ACID, PLASMA    EKG EKG Interpretation  Date/Time:  Friday January 20 2021 11:18:54 EDT Ventricular Rate:  106 PR Interval:  160 QRS Duration: 78 QT Interval:  366 QTC Calculation: 486 R Axis:   26 Text Interpretation: Sinus tachycardia with frequent Premature ventricular complexes Low voltage QRS Septal infarct , age undetermined Abnormal ECG When compared to prior, more PVC and more artifact. No STEMI Confirmed by Antony Blackbird [phone]) on 01/20/2021 1:04:55 PM  Radiology DG Chest 2 View  Result Date: 01/20/2021 CLINICAL DATA:  Rhonchi, shortness of breath EXAM: CHEST - 2 VIEW COMPARISON:  09/21/2020 FINDINGS: The heart size and mediastinal contours are within normal limits. Minimal diffuse interstitial opacity. The visualized skeletal structures are unremarkable. IMPRESSION: Minimal diffuse interstitial  opacity, similar to prior which may reflect mild edema or infection. No focal airspace opacity. Electronically Signed   By: Delanna Ahmadi M.D.   On: 01/20/2021 11:49    Procedures .Critical Care E&M Performed by: Domenic Moras, PA-C  Critical care provider statement:    Critical care time (minutes):  33   Critical care was necessary to treat or prevent imminent or life-threatening deterioration of the following conditions:  Cardiac failure, respiratory failure and sepsis   Critical care was time spent personally by me on the following activities:  Blood draw for specimens, development of treatment plan with patient or surrogate, discussions with consultants, evaluation of patient's response to treatment, examination of patient, obtaining history from patient or surrogate, ordering and performing treatments and interventions, ordering and review of laboratory studies, ordering and review of radiographic studies, pulse oximetry, re-evaluation of patient's condition and review of old charts   I assumed direction of critical care for this patient from another provider in my specialty: no     Care discussed with: admitting provider   After initial E/M assessment, critical care services were subsequently performed that were exclusive of separately billable procedures or treatment.     Medications Ordered in ED Medications  lactated ringers infusion (has no administration in time range)  cefTRIAXone (ROCEPHIN) 2 g in sodium chloride 0.9 % 100 mL IVPB (0 g Intravenous Stopped 01/20/21 1517)  azithromycin (ZITHROMAX) 500 mg in sodium chloride 0.9 % 250 mL IVPB (has no administration in time range)  ipratropium-albuterol (DUONEB) 0.5-2.5 (3) MG/3ML nebulizer solution 3 mL (has no administration in time range)  sodium chloride 0.9 % bolus 1,000 mL (has no administration in time range)  acetaminophen (TYLENOL) suppository 650 mg (has no administration in time range)  ipratropium-albuterol (DUONEB) 0.5-2.5  (3) MG/3ML nebulizer solution (3 mLs  Given 01/20/21 1527)    ED Course  I have reviewed the triage vital signs and the nursing notes.  Pertinent labs & imaging results that were available during my care of the patient were reviewed by me and considered in my medical decision making (see chart for details).  MDM Rules/Calculators/A&P                           BP 109/71 (BP Location: Right Arm)   Pulse (!) 103   Temp (!) 100.7 F (38.2 C) (Rectal)   Resp 18   SpO2 100%   Final Clinical Impression(s) / ED Diagnoses Final diagnoses:  Community acquired pneumonia, unspecified laterality  Sepsis, due to unspecified organism, unspecified whether acute organ dysfunction present ([place])    Rx / DC Orders ED Discharge Orders     None      1:51 PM Patient brought here due to shortness of breath, having increased respiratory effort, cough and occasional bouts of choking for the past several days.  Does have history of cerebral palsy and therefore difficult to obtain so most history was obtained through her niece who is at bedside.  Patient appears to be tachypneic with respiratory rates in the 40s.  He is tachycardic, chest x-ray shows minimal diffuse interstitial opacities suggestive of mild edema or infection.  No focal airspace opacity.  Labs are otherwise reassuring.  Due to choking and having increase shortness of breath I suspect this could be signs of potential aspiration pneumonia.  Work-up initiated, antibiotics started  2:55 PM Rectal temp reviewed temperature of 100.7.  Tylenol given.  Patient initially was placed on 2 L of supplemental oxygen due to her tachypnea.  That has since discontinued.  We will monitor patient closely.  3:29 PM Nurse notified of patient has some run of multiple beats of PVCs consistent with V. tach.  Review patient's previous notes, patient is DNR/DNI.  At this time his blood pressure remained stable.  Does have an elevated lactic acid coupled with  fever, code sepsis have been initiated.  Will not place patient on Zoll pads.  Dr. Gilford Raid made aware of pt.   3:54 PM Appreciate consultation to [facility] Medicine resident who agrees to see and will admit pt.     Domenic Moras, PA-C 01/20/21 1555    Tegeler, Gwenyth Allegra, MD 01/21/21 1039

## 2021-01-20 NOTE — Hospital Course (Addendum)
Rick Schwartz is a 80 y.o. male presenting with shortness of breath . PMH is significant for Spastic Quadriplegia, HFrEF (EF < 20% 7/22), PE on Eliquis, dementia, Prostate Cancer, HLD, HTN, PAD.   Aspiration PNA  Sepsis, resolved Patient presented with shortness of breath for 3-4 days, with cough and history of choking on food. In the ED his RR was 43, with a temperature of 100.7 and a BP on 103/58. Labs were significant for a lactic acid of 2.6, with slightly elevated PT 18.2, and slightly elevated INR of 1.5, with a K at 3.4, a T. Billi of 1.6. On Imaging, CXR showed minimal diffuse interstitial opacity that may reflect mild edema or infection. Patient was started on ceftriaxone, azithromycin, and given a 1L fluid bolus. DuoNebs every 4 hours prn were ordered but low suspicion for COPD during hospitalization given lung examinations. MRSA nasal swab was positive, he completed a 5 day course of  nasal bactroban. Legionella Ag was negative. Received ceftriaxone and azithromycin (11/4) Azithromycin (11/4, restarted 11/6 -11/7) Vancomycin (11/5-11/6) Unasyn (11/5-11/7). He was then transitioned to PO augmentin and doxycycline course for 7 day total and discharged with 2 additional days. At end of hospitalization patient was afebrile and not meeting sepsis criteria, with blood and urine cultures showing no growth. Prior to discharge he was stable on room air throughout the day without desaturations.    HFrEF EF < 20% Patient with history of CHF, Last echo in July of 22 showed < 20% EF. Patient taking lasix 20 mg, losartan 25 mg, metoprolol succinate 25 mg at home. Patient Euvolemic. Losartan was held due to soft blood pressures. Metoprolol and lasix were continued.   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 States he does not follow with neurology. Family not interested in SNF and patient to continue  living under care of sister at home. Sent DME order for hoyer lift.  Oliguria, resolved During hospitalization patient with documented decreased urine output. Bladder scan performed which did not show urinary retention. By time of discharge patient was urinating without difficulty.

## 2021-01-20 NOTE — Telephone Encounter (Signed)
[name] calls nurse line reporting that patient is experiencing wheezing and difficulty breathing. States that patient is having "a hard time catching his breath." [name] would like to schedule patient appointment as soon as possible.   Advised with these symptoms patient should be evaluated today, however, we do not have any appointments until next week. Advised ED evaluation. Sister verbalizes understanding.   Talbot Grumbling, RN

## 2021-01-20 NOTE — ED Notes (Signed)
Pt had runs of vtach at 1523. This writer was able to obtain a copy from the telemetry monitor of the rhythm. Bowie,PA made aware.

## 2021-01-20 NOTE — Progress Notes (Addendum)
FPTS Interim Progress Note  S: Patient in bed resting comfortably watching TV.  RN at bedside, discussed orders.  No concerns at this time.  Patient says his breathing "is not that good", wanted to sit up a little bit in bed.  Nasal cannula replaced on nares, oxygen at 2 L.    O: BP (!) 121/47 (BP Location: Left Arm)   Pulse 97   Temp 97.9 F (36.6 C) (Oral)   Resp (!) 21   Ht 5\' 7"  (1.702 m)   Wt 54 kg   SpO2 98%   BMI 18.65 kg/m   HEENT: large elevated knot on right superior forehead Cardiac: Heart sounds difficult to auscultate over lung sounds but appear to be regular rate and rhythm with no murmurs Respiratory: Faint rales, diffuse rhonchi, tachypneic to mid 30s  A/P:  Aspiration PNA  Sepsis SPO2 >96% 2 L O2.  Patient reports his breathing at this time is "not that good".  Elevated head of bed for comfort.  Faint rales and diffuse rhonchi on exam.  Currently afebrile.  Nurse performed bedside swallow, if passes will do n.p.o. sips with meds.  Anticipate formal swallow evaluation with SLP tomorrow.  No changes to daytime plan.   HFrEF EF < 20% BNP on admission 1146.9.  This may be at his baseline though, given previous measurements of 1865 in July and 1072 in September.  Home meds include Lasix, losartan, metoprolol all of which are being held at this time.   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 Reports compliance with Eliquis.  -Continue apixaban   Prediabetes Awaiting A1c from admission.  Continue CBG checks.  HTN Bps still soft s/p 1L NS bolus. Last 121/47, pulse 97. Currently holding home antihypertensives. Given faint rales on evening exam, decline to give additional fluids at this time.   CODE Status Spoke with patient's sister who confirmed he has a DNR document at home. Placed DNR order.    Rick Essex, MD 01/20/2021, 11:18 PM PGY-2, [place] Medicine Service pager [phone]

## 2021-01-20 NOTE — Progress Notes (Signed)
Attempted to speak with [name] regarding patient's code status. He specifically asked [place] to call his sister about this, stating that he himself does not know. She reports they have a signed DNR at home. She reports he should be marked as a DNR status.   Lynnell Chad (Sister)  [phone] [facility] Phone)  Ezequiel Essex, MD

## 2021-01-20 NOTE — ED Notes (Signed)
Pt placed on 2L [place] for respirations of 50. PA made aware.

## 2021-01-21 DIAGNOSIS — J189 Pneumonia, unspecified organism: Secondary | ICD-10-CM | POA: Diagnosis not present

## 2021-01-21 LAB — CBC
HCT: 49.7 % (ref 39.0–52.0)
Hemoglobin: 16.2 g/dL (ref 13.0–17.0)
MCH: 29.3 pg (ref 26.0–34.0)
MCHC: 32.6 g/dL (ref 30.0–36.0)
MCV: 90 fL (ref 80.0–100.0)
Platelets: 268 10*3/uL (ref 150–400)
RBC: 5.52 MIL/uL (ref 4.22–5.81)
RDW: 14.4 % (ref 11.5–15.5)
WBC: 7.9 10*3/uL (ref 4.0–10.5)
nRBC: 0 % (ref 0.0–0.2)

## 2021-01-21 LAB — COMPREHENSIVE METABOLIC PANEL
ALT: 10 U/L (ref 0–44)
AST: 19 U/L (ref 15–41)
Albumin: 2.7 g/dL — ABNORMAL LOW (ref 3.5–5.0)
Alkaline Phosphatase: 55 U/L (ref 38–126)
Anion gap: 8 (ref 5–15)
BUN: 6 mg/dL — ABNORMAL LOW (ref 8–23)
CO2: 21 mmol/L — ABNORMAL LOW (ref 22–32)
Calcium: 8.8 mg/dL — ABNORMAL LOW (ref 8.9–10.3)
Chloride: 114 mmol/L — ABNORMAL HIGH (ref 98–111)
Creatinine, Ser: 0.59 mg/dL — ABNORMAL LOW (ref 0.61–1.24)
GFR, Estimated: >60 mL/min (ref >60)
Glucose, Bld: 118 mg/dL — ABNORMAL HIGH (ref 70–99)
Potassium: 3.6 mmol/L (ref 3.5–5.1)
Sodium: 143 mmol/L (ref 135–145)
Total Bilirubin: 1.6 mg/dL — ABNORMAL HIGH (ref 0.3–1.2)
Total Protein: 6 g/dL — ABNORMAL LOW (ref 6.5–8.1)

## 2021-01-21 LAB — TSH: TSH: 1.564 u[IU]/mL (ref 0.350–4.500)

## 2021-01-21 LAB — URINALYSIS, ROUTINE W REFLEX MICROSCOPIC
Bilirubin Urine: NEGATIVE
Glucose, UA: NEGATIVE mg/dL
Hgb urine dipstick: NEGATIVE
Ketones, ur: NEGATIVE mg/dL
Leukocytes,Ua: NEGATIVE
Nitrite: NEGATIVE
Protein, ur: NEGATIVE mg/dL
Specific Gravity, Urine: 1.02 (ref 1.005–1.030)
pH: 5 (ref 5.0–8.0)

## 2021-01-21 MED ORDER — ORAL CARE MOUTH RINSE
15.0000 mL | Freq: Two times a day (BID) | OROMUCOSAL | Status: DC
  Administered 2021-01-21 – 2021-01-25 (×8): 15 mL via OROMUCOSAL

## 2021-01-21 MED ORDER — VANCOMYCIN HCL 1250 MG/250ML IV SOLN
1250.0000 mg | INTRAVENOUS | Status: DC
  Administered 2021-01-21 (×2): 1250 mg via INTRAVENOUS
  Filled 2021-01-21 (×2): qty 250

## 2021-01-21 MED ORDER — SODIUM CHLORIDE 0.9 % IV SOLN
3.0000 g | Freq: Three times a day (TID) | INTRAVENOUS | Status: DC
  Administered 2021-01-21 – 2021-01-23 (×6): 3 g via INTRAVENOUS
  Filled 2021-01-21 (×8): qty 8

## 2021-01-21 NOTE — Progress Notes (Signed)
PTs breathing remains rhonchi/wet. Strong congested cough. Rn believes that pt would really benefit from IV steroids, neb treatment, and some sort of mucolytic (if available in IV form?) and contacted Rock Nephew, MD with [facility] via secure chat at [phone].   MD said she would discuss with her team and get back to RN.   RN sent second message requesting update at 1718.   RN still awaiting response/new orders.

## 2021-01-21 NOTE — Plan of Care (Signed)
  Problem: Coping: Goal: Level of anxiety will decrease Outcome: Progressing   Problem: Elimination: Goal: Will not experience complications related to bowel motility Outcome: Progressing Goal: Will not experience complications related to urinary retention Outcome: Progressing   Problem: Pain Managment: Goal: General experience of comfort will improve Outcome: Progressing   Problem: Safety: Goal: Ability to remain free from injury will improve Outcome: Progressing   Problem: Skin Integrity: Goal: Risk for impaired skin integrity will decrease Outcome: Progressing   Problem: Education: Goal: Knowledge of General Education information will improve Description: Including pain rating scale, medication(s)/side effects and non-pharmacologic comfort measures Outcome: Not Progressing   Problem: Health Behavior/Discharge Planning: Goal: Ability to manage health-related needs will improve Outcome: Not Progressing   Problem: Clinical Measurements: Goal: Ability to maintain clinical measurements within normal limits will improve Outcome: Not Progressing Goal: Will remain free from infection Outcome: Not Progressing Goal: Diagnostic test results will improve Outcome: Not Progressing Goal: Respiratory complications will improve Outcome: Not Progressing Goal: Cardiovascular complication will be avoided Outcome: Not Progressing   Problem: Activity: Goal: Risk for activity intolerance will decrease Outcome: Not Progressing   Problem: Nutrition: Goal: Adequate nutrition will be maintained Outcome: Not Progressing

## 2021-01-21 NOTE — Progress Notes (Signed)
Initial Nutrition Assessment  DOCUMENTATION CODES:   Underweight, suspect malnutrition but unable to confirm at this time  INTERVENTION:   - RD will monitor for diet advancement and add oral nutrition supplements as appropriate  - If unable to advance diet and within pt's [place], recommend considering Cortrak placement and initiation of enteral nutrition  NUTRITION DIAGNOSIS:   Inadequate oral intake related to dysphagia as evidenced by NPO status.  GOAL:   Patient will meet greater than or equal to 90% of their needs  MONITOR:   Diet advancement, Labs, Weight trends, I & O's  REASON FOR ASSESSMENT:   Consult Assessment of nutrition requirement/status  ASSESSMENT:   80 year old male who presented to the ED on 11/04 with SOB, cough, difficulty swallowing. 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 Pt admitted with sepsis, aspiration PNA.  11/05 - SLP evaluation with recommendations for NPO  RD consulted for assessment. Pt is currently NPO. Per SLP note, plan is to follow up tomorrow to determine readiness for diet vs need for instrumental swallow evaluation.  Attempted to reach pt/family via phone call to room; however, no answer. Unable to obtain diet and weight history at this time. Reviewed available weight history in chart. Pt pt with recent weight gain. Weight of 43 kg documented on 09/27/20. Current weight is 52.8 kg. However, current weight is down compared to weight from 2019 of 71.5 kg. Strongly suspect pt with malnutrition but unable to confirm at this time.  RD will monitor for diet advancement and add oral nutrition supplements as appropriate. If unable to advance diet and within pt's [place], recommend considering Cortrak placement and initiation of enteral nutrition.  Medications reviewed and include: IV abx  Labs reviewed: LDL 109, lactic acid 2.0  UOP: 225 ml x 12 hours I/O's: +475 ml since  admit  NUTRITION - FOCUSED PHYSICAL EXAM:  Unable to complete at this time. RD working remotely.  Diet Order:   Diet Order             Diet NPO time specified Except for: Other (See Comments)  Diet effective now                   EDUCATION NEEDS:   Not appropriate for education at this time  Skin:  Skin Assessment: Reviewed RN Assessment  Last BM:  01/20/21  Height:   Ht Readings from Last 1 Encounters:  01/20/21 5\' 7"  (1.702 m)    Weight:   Wt Readings from Last 1 Encounters:  01/21/21 52.8 kg    BMI:  Body mass index is 18.23 kg/m.  Estimated Nutritional Needs:   Kcal:  1650-1850  Protein:  75-90 grams  Fluid:  1.6-1.8 L    Gustavus Bryant, MS, RD, LDN Inpatient Clinical Dietitian Please see AMiON for contact information.

## 2021-01-21 NOTE — Progress Notes (Signed)
OT Cancellation Note  Patient Details Name: WEST BOOMERSHINE MRN: [id number] DOB: Mar 30, 1940   Cancelled Treatment:    Reason Eval/Treat Not Completed: OT screened, no needs identified, will sign off.  Spoke with patient's sister and CG.  Sister and family "do everything" for him, no OT skilled needs in the acute setting.  Sister is mainly concerned about his swallowing.    Victoriah Wilds D Ranferi Clingan 01/21/2021, 11:33 AM

## 2021-01-21 NOTE — Progress Notes (Addendum)
Family Medicine Teaching Service Daily Progress Note Intern Pager: [phone]  Patient name: Rick Schwartz Medical record number: [id number] Date of birth: 09/21/1940 Age: 80 y.o. Gender: male  Primary Care Provider: Lurline Del, DO Consultants: None Code Status: DNR/DNI  Pt Overview and Major Events to Date:  01/20/21 - admitted to progressive  Assessment and Plan: Mr. Schmuck is a 80 y.o. male presenting with shortness of breath. 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   Aspiration PNA  Pt met sepsis criteria on admission, has since resolved. Currently afebrile, NSR, BP 113/74 tachypnic at 23 RR. Overnight, pt nasal swab was positive for MRSA, antibitoics were switched from ceftriaxone and azithro to vancomycin 1281m and unasyn 3g per pharmacy rec. Lactic acid down from 2.6 on admission to 2.0. INR of 1.5, K at 3.6, total billi of 1.6. WBC of 7.9 this morning, slight increase from 7.1 on admission. Urine analysis is WNL, pending urine and blood cultures. TSH wnl at 1.564. BUN of 6 and Cr of 0.59 this morning. BNP elevated at 1146.9. This morning, pt states he feels his work of breathing is the same as yesterday. On exam, pt had labored breathing with rhonchi present bilaterally. O2 sat 92% on room air.  -d/c'd CFTX and Azithro -started Vancomycin 1250 mg and Unasyn 3 g  -follow up blood cultures -follow up UA/Urine Cx -continue to monitor respiratory status -follow up legionella Ur Ag -switch diet to regular if pt passes SLP swallow eval -daily weights -strict I/O -hold home anti-hypertensives  HFrEF <20% Lipid panel shows LDL of 109. At home, patient takes lasix 20 mg, losartan 25 mg, metoprolol succinate 25 mg at home. Pt's BP's have been soft while admitted, so home antihypertensives are being held. 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 Pt reports compliance with eliquis.  -continue apixaban  Prediabetes Glucose 118 this morning. A1c 5.5%, decrease from 5.6% in 7/21.  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 Does not follow with neurology. -PT/OT  HLD Continue crestor 20 mg.   HTN On metoprolol succinate and losartan at home. BP this morning 116/71.  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  Subjective:  Pt seen at bedside this morning AAOx3. No acute events overnight. Pt states his breathing effort is the same as prior to admission. He also states he is hungry.   Objective: Temp:  [97.8 F (36.6 C)-100.7 F (38.2 C)] 97.8 F (36.6 C) (11/05 0322) Pulse Rate:  [53-103] 87 (11/05 0322) Resp:  [18-48] 19 (11/05 0322) BP: (102-152)/(47-130) 113/74 (11/05 0322) SpO2:  [96 %-100 %] 98 % (11/05 0322) Weight:  [52.8 kg-54 kg] 52.8 kg (11/05 0620) Physical Exam: General: NAD, AAOx3 Cardiovascular: RRR, no murmur, normal S1/S2 Respiratory: Rhonci bilaterally, although not as severe as on admission Abdomen: non-tender to palpation Extremities: no warmth or edema  Laboratory: Recent Labs  Lab 01/20/21 1144 01/21/21 0027  WBC 7.1 7.9  HGB 17.0 16.2  HCT 53.0* 49.7  PLT 305 268   Recent Labs  Lab 01/20/21 1144 01/21/21 0027  NA 140 143  K 3.6 3.6  CL 110 114*  CO2 21* 21*  BUN 8 6*  CREATININE 0.68 0.59*  CALCIUM 8.9 8.8*  PROT 6.8 6.0*  BILITOT 1.6* 1.6*  ALKPHOS 63 55  ALT 10 10  AST 18 19  GLUCOSE 148* 118*      Imaging/Diagnostic Tests: DG Chest 2 View  Result Date: 01/20/2021 CLINICAL DATA:  Rhonchi,  shortness of breath EXAM: CHEST - 2 VIEW COMPARISON:  09/21/2020 FINDINGS: The heart size and mediastinal contours are within normal limits. Minimal diffuse interstitial opacity. The visualized skeletal structures are unremarkable. IMPRESSION: Minimal diffuse interstitial opacity, similar to prior which may reflect mild edema or infection. No focal airspace opacity. Electronically Signed   By: Delanna Ahmadi M.D.   On: 01/20/2021 11:49     Angus Seller, Medical Student 01/21/2021, 7:38 AM MS-4, [place] Intern pager: [phone], text pages welcome   I was personally present and re-performed the exam and medical decision making and verified the service and findings are accurately documented in the student's note.  Alcus Dad, MD 01/21/2021 12:49 PM

## 2021-01-21 NOTE — Progress Notes (Signed)
Was paged regarding nasal swab positive for MRSA.  Currently prescribed ceftriaxone and azithromycin.  Spoke with pharmacy, who recommended switching to vancomycin and Unasyn for better MRSA coverage.  Pharmacy to place new antibiotics orders.  DC'd ceftriaxone and azithromycin.  Ezequiel Essex, MD

## 2021-01-21 NOTE — Plan of Care (Signed)
  Problem: Clinical Measurements: Goal: Ability to maintain clinical measurements within normal limits will improve Outcome: Progressing Goal: Will remain free from infection Outcome: Progressing Goal: Cardiovascular complication will be avoided Outcome: Progressing   Problem: Coping: Goal: Level of anxiety will decrease Outcome: Progressing   Problem: Elimination: Goal: Will not experience complications related to bowel motility Outcome: Progressing   Problem: Pain Managment: Goal: General experience of comfort will improve Outcome: Progressing   Problem: Skin Integrity: Goal: Risk for impaired skin integrity will decrease Outcome: Progressing

## 2021-01-21 NOTE — Progress Notes (Signed)
FPTS Brief Progress Note  S: Sitting in bed watching baseball. Denies increased work of breathing. Nursing at bedside.  O: BP 130/84 (BP Location: Left Arm)   Pulse 98   Temp 98.4 F (36.9 C) (Oral)   Resp (!) 22   Ht 5\' 7"  (1.702 m)   Wt 52.8 kg   SpO2 98%   BMI 18.23 kg/m   General: Appears well, no acute distress. Age appropriate. Respiratory: CTA LLL, rhonchi on RLL, normal effort  A/P: Rick Schwartz is a 80 y.o. male presenting with shortness of breath. 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   Aspiration PNA  - Orders reviewed. Labs for AM ordered, which was adjusted as needed.  - f/u urine culture and legionella -Monitor respiratory status  Gerlene Fee, DO 01/21/2021, 9:58 PM PGY-3, [place] [facility] Night Resident  Please page [phone] with questions.

## 2021-01-21 NOTE — Progress Notes (Signed)
PT Cancellation Note  Patient Details Name: Rick Schwartz MRN: [id number] DOB: [birth year]   Cancelled Treatment:    Reason Eval/Treat Not Completed: PT screened, no needs identified, will sign off (per pt and sister pt total assist at home with family physically lifting him to a WC. Total assist during interview for rolling and repositioning with pt stating baseline without need for further therapy. PT would benefit from hoyer lift at home to decrease caregiver burden)   [name] [name] 01/21/2021, 11:37 AM [name], PT Acute Rehabilitation Services Pager: [phone] Office: [phone]

## 2021-01-21 NOTE — Progress Notes (Signed)
Pharmacy Antibiotic Note  Rick Schwartz is a 80 y.o. male admitted on 01/20/2021 with  possible aspiration pneumonia .  Pharmacy has been consulted for Vancomycin/Unasyn dosing. Pt recently in hospital 2 months ago and MRSA PCR+. WBC WNL. Renal function age appropriate.   Plan: Vancomycin 1250 mg IV q24h >>Estimated AUC: 514 Unasyn 3g IV q8h Trend WBC, temp, renal function  F/U infectious work-up Drug levels as indicated   Height: 5\' 7"  (170.2 cm) Weight: 54 kg (119 lb 0.8 oz) IBW/kg (Calculated) : 66.1  Temp (24hrs), Avg:98.5 F (36.9 C), Min:97.8 F (36.6 C), Max:100.7 F (38.2 C)  Recent Labs  Lab 01/20/21 1144 01/20/21 1420 01/20/21 1547  WBC 7.1  --   --   CREATININE 0.68  --   --   LATICACIDVEN  --  2.6* 2.0*    Estimated Creatinine Clearance: 57.2 mL/min (by C-G formula based on SCr of 0.68 mg/dL).    No Known Allergies  Narda Bonds, PharmD, BCPS Clinical Pharmacist Phone: [phone]

## 2021-01-21 NOTE — Evaluation (Addendum)
Clinical/Bedside Swallow Evaluation Patient Details  Name: Rick Schwartz MRN: [id number] Date of Birth: [birth year]  Today's Date: 01/21/2021 Time: SLP Start Time (ACUTE ONLY): 0904 SLP Stop Time (ACUTE ONLY): 0920 SLP Time Calculation (min) (ACUTE ONLY): 16 min  Past Medical History:  Past Medical History:  Diagnosis Date   Cancer ([place])    Prostate   Cerebral palsy ([place])    Hyperlipidemia    Hypertension    Peripheral arterial disease ([place])    critical limb ischemia   Past Surgical History:  Past Surgical History:  Procedure Laterality Date   AMPUTATION Right 07/22/2020   Procedure: RIGHT ABOVE KNEE AMPUTATION;  Surgeon: [name], MD;  Location: [facility];  Service: Orthopedics;  Laterality: Right;   PROSTATE SURGERY     HPI:  Mr. Gilmer is a 80 y.o. male presenting with shortness of breath. 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 Previous BSE in July 2022 with recommendations for Dys 2 solids and thin liquid. CXR concerning for aspiration PNA vs infection.    Assessment / Plan / Recommendation  Clinical Impression  Pt was seen for a bedside swallow evaluation and he presents with suspected moderate oropharyngeal dysphagia.  Pt was noted to have congested respirations upon SLP arrival and RR increased to the low 40s-low 50s when HOB was elevated in the absence of PO.  Oral mechanism examination was remarkable for R facial asymmetry, decreased facial strength/ROM on the R, and bilateral decreased lingual strength and ROM.  Pt consumed trials of ice chips, thin liquid, nectar-thick liquid, and puree.  RR ranged from the 30s-50s throughout this evaluation.  Suspected prolonged AP transit of all PO trials, and R anterior labial spillage was noted with spoon sips of thin liquid.  An immediate cough was noted following 2/3 spoon sips of thin liquid, 1/3 straw sips of thin liquid, and 1/2 trials of puree.   Watery eyes were additionally noted with 1/2 puree trials.  No overt s/sx of aspiration were observed with ice chips or nectar-thick liquid.  Given current tachypnea and intermittent clinical s/sx of aspiration with a variety of PO trials, recommend continuation of NPO with essential oral medications administered crushed in puree.  Pt may have a few ice chips PRN following thorough oral care and given RN supervision when RR is below 30.  SLP will plan to f/u on 11/6 to determine readiness for clinical diet initiation vs need for instrumental swallow evaluation.  SLP Visit Diagnosis: Dysphagia, oropharyngeal phase (R13.12)    Aspiration Risk  Moderate aspiration risk    Diet Recommendation NPO;Ice chips PRN after oral care   Liquid Administration via: Spoon Medication Administration: Crushed with puree Supervision: Full supervision/cueing for compensatory strategies Compensations: Minimize environmental distractions;Slow rate Postural Changes: Seated upright at 90 degrees    Other  Recommendations Oral Care Recommendations: Oral care prior to ice chip/H20;Oral care QID;Staff/trained caregiver to provide oral care Other Recommendations: Have oral suction available    Recommendations for follow up therapy are one component of a multi-disciplinary discharge planning process, led by the attending physician.  Recommendations may be updated based on patient status, additional functional criteria and insurance authorization.  Follow up Recommendations Skilled Nursing facility      Frequency and Duration min 2x/week  2 weeks       Prognosis Prognosis for Safe Diet Advancement: Good      Swallow Study   General HPI: Mr. Velez  is a 80 y.o. male presenting with shortness of breath. 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 Previous BSE in July 2022 with recommendations for Dys 2 solids and thin liquid. CXR  concerning for aspiration PNA vs infection. Type of Study: Bedside Swallow Evaluation Diet Prior to this Study: NPO Temperature Spikes Noted: Yes Respiratory Status: Room air History of Recent Intubation: No Behavior/Cognition: Alert;Cooperative;Pleasant mood Oral Cavity Assessment: Within Functional Limits Oral Care Completed by SLP: No Oral Cavity - Dentition: Edentulous Self-Feeding Abilities: Needs assist Patient Positioning: Upright in bed Baseline Vocal Quality: Normal Volitional Cough: Weak;Congested Volitional Swallow: Able to elicit    Oral/Motor/Sensory Function Overall Oral Motor/Sensory Function: Moderate impairment Facial ROM: Reduced right;Suspected CN VII (facial) dysfunction Facial Symmetry: Abnormal symmetry right Facial Strength: Reduced right Lingual ROM: Reduced right;Reduced left Lingual Symmetry: Within Functional Limits Lingual Strength: Reduced   Ice Chips Ice chips: Within functional limits Presentation: Spoon   Thin Liquid Thin Liquid: Impaired Presentation: Spoon;Straw Oral Phase Impairments: Reduced labial seal Oral Phase Functional Implications: Right anterior spillage Pharyngeal  Phase Impairments: Cough - Immediate    Nectar Thick Nectar Thick Liquid: Within functional limits Presentation: Straw   Honey Thick Honey Thick Liquid: Not tested   Puree Puree: Impaired Presentation: Spoon Oral Phase Functional Implications: Prolonged oral transit Pharyngeal Phase Impairments: Multiple swallows;Cough - Immediate   Solid     Solid: Not tested     Colin Mulders M.S., CCC-SLP Acute Rehabilitation Services Office: [phone]  Elvia Collum Kaylianna Detert 01/21/2021,9:32 AM

## 2021-01-22 ENCOUNTER — Encounter (HOSPITAL_COMMUNITY): Payer: Self-pay | Admitting: Student

## 2021-01-22 ENCOUNTER — Other Ambulatory Visit: Payer: Self-pay

## 2021-01-22 DIAGNOSIS — J189 Pneumonia, unspecified organism: Secondary | ICD-10-CM | POA: Diagnosis not present

## 2021-01-22 DIAGNOSIS — J690 Pneumonitis due to inhalation of food and vomit: Secondary | ICD-10-CM | POA: Diagnosis not present

## 2021-01-22 LAB — CBC
HCT: 49.7 % (ref 39.0–52.0)
Hemoglobin: 15.5 g/dL (ref 13.0–17.0)
MCH: 29.1 pg (ref 26.0–34.0)
MCHC: 31.2 g/dL (ref 30.0–36.0)
MCV: 93.2 fL (ref 80.0–100.0)
Platelets: 239 10*3/uL (ref 150–400)
RBC: 5.33 MIL/uL (ref 4.22–5.81)
RDW: 14.4 % (ref 11.5–15.5)
WBC: 7.9 10*3/uL (ref 4.0–10.5)
nRBC: 0 % (ref 0.0–0.2)

## 2021-01-22 LAB — BASIC METABOLIC PANEL
Anion gap: 12 (ref 5–15)
BUN: 9 mg/dL (ref 8–23)
CO2: 16 mmol/L — ABNORMAL LOW (ref 22–32)
Calcium: 8.7 mg/dL — ABNORMAL LOW (ref 8.9–10.3)
Chloride: 117 mmol/L — ABNORMAL HIGH (ref 98–111)
Creatinine, Ser: 0.72 mg/dL (ref 0.61–1.24)
GFR, Estimated: >60 mL/min (ref >60)
Glucose, Bld: 109 mg/dL — ABNORMAL HIGH (ref 70–99)
Potassium: 3.8 mmol/L (ref 3.5–5.1)
Sodium: 145 mmol/L (ref 135–145)

## 2021-01-22 LAB — URINE CULTURE: Culture: NO GROWTH

## 2021-01-22 LAB — PROCALCITONIN: Procalcitonin: <0.1 ng/mL

## 2021-01-22 LAB — MAGNESIUM: Magnesium: 1.6 mg/dL — ABNORMAL LOW (ref 1.7–2.4)

## 2021-01-22 LAB — GLUCOSE, CAPILLARY
Glucose-Capillary: 119 mg/dL — ABNORMAL HIGH (ref 70–99)
Glucose-Capillary: 135 mg/dL — ABNORMAL HIGH (ref 70–99)

## 2021-01-22 MED ORDER — MAGNESIUM SULFATE 4 GM/100ML IV SOLN
4.0000 g | Freq: Once | INTRAVENOUS | Status: AC
  Administered 2021-01-22: 4 g via INTRAVENOUS
  Filled 2021-01-22: qty 100

## 2021-01-22 MED ORDER — POTASSIUM CHLORIDE CRYS ER 20 MEQ PO TBCR
20.0000 meq | EXTENDED_RELEASE_TABLET | Freq: Once | ORAL | Status: AC
  Administered 2021-01-22: 20 meq via ORAL
  Filled 2021-01-22: qty 1

## 2021-01-22 MED ORDER — SODIUM CHLORIDE 0.9 % IV SOLN
500.0000 mg | INTRAVENOUS | Status: AC
  Administered 2021-01-22 – 2021-01-23 (×2): 500 mg via INTRAVENOUS
  Filled 2021-01-22 (×2): qty 500

## 2021-01-22 MED ORDER — VANCOMYCIN HCL 1250 MG/250ML IV SOLN
1250.0000 mg | INTRAVENOUS | Status: DC
  Administered 2021-01-22: 1250 mg via INTRAVENOUS
  Filled 2021-01-22 (×2): qty 250

## 2021-01-22 NOTE — Progress Notes (Signed)
Speech Language Pathology Treatment: Dysphagia  Patient Details Name: RAMIZ TURPIN MRN: [id number] DOB: 10/09/1940 Today's Date: 01/22/2021 Time: 6314-9702 SLP Time Calculation (min) (ACUTE ONLY): 20 min  Assessment / Plan / Recommendation Clinical Impression  Patient seen for dysphagia treatment with focus on trials of PO's to determine patient readiness to initiate full PO diet. Patient was alert and stated that he is hungry and was eager to have PO's. RN and SLP repositioned patient in bed and HOB raised for safe positioning for PO intake. Of note, RR fluctuated greatly before, during and after PO intake, going as low as 15 and as high as 38 but mainly staying in 22-25 range. Patient did not exhibit any physical s/s respiratory difficulty. During BSE that was completed previous date, RR was reportedly in range of 30-50. SLP performed oral care prior to PO's but with oral mucosa moist and only trace amount of secretions removed. SLP observed patient as he drank successive straw sips of thin liquids (water) and ate bites of puree solids. He exhibited a mildly prolonged oral phase with puree but with full clearance post swallows and no significant amount of residuals in oral cavity post swallows. Patient did not exhibit any overt s/s aspiration or penetration with intake of approximately 10-12 ounces of water and cup of pudding. SLP is recommending to initiate a Dys 1(puree) solids and thin liquids diet. Patient will require full assistance as he is unable to feed himself.    HPI HPI: Mr. Jacinto is a 80 y.o. male presenting with shortness of breath. 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 Previous BSE in July 2022 with recommendations for Dys 2 solids and thin liquid. CXR concerning for aspiration PNA vs infection.      SLP Plan  Continue with current plan of care      Recommendations for follow up therapy are  one component of a multi-disciplinary discharge planning process, led by the attending physician.  Recommendations may be updated based on patient status, additional functional criteria and insurance authorization.    Recommendations  Diet recommendations: Dysphagia 1 (puree);Thin liquid Liquids provided via: Straw;Cup Medication Administration: Crushed with puree Supervision: Trained caregiver to feed patient;Full supervision/cueing for compensatory strategies Compensations: Slow rate;Small sips/bites;Minimize environmental distractions Postural Changes and/or Swallow Maneuvers: Seated upright 90 degrees                Oral Care Recommendations: Oral care before and after PO;Oral care BID;Staff/trained caregiver to provide oral care Follow up Recommendations: Skilled Nursing facility SLP Visit Diagnosis: Dysphagia, oropharyngeal phase (R13.12) Plan: Continue with current plan of care       Sonia Baller, MA, CCC-SLP Speech Therapy

## 2021-01-22 NOTE — Plan of Care (Signed)

## 2021-01-22 NOTE — Plan of Care (Addendum)
Discussed with patient plan of care for the evening, pain management, bowel movements with some teach back displayed  Problem: Education: Goal: Knowledge of General Education information will improve Description: Including pain rating scale, medication(s)/side effects and non-pharmacologic comfort measures Outcome: Progressing   Problem: Health Behavior/Discharge Planning: Goal: Ability to manage health-related needs will improve Outcome: Progressing

## 2021-01-22 NOTE — Progress Notes (Signed)
Pharmacy Antibiotic Note  Rick Schwartz is a 80 y.o. male admitted on 01/20/2021 with  possible aspiration pneumonia .  Pharmacy has been consulted for Vancomycin/Unasyn dosing. Pt recently in hospital 2 months ago and MRSA PCR+. WBC WNL. Scr stable. Cutlures NGTD.  Patient was started on vancomycin 11/5, but then orders and consult were discontinued. New consult ordered 11/6 given MRSA PCR positive. Given stable renal function and doses given on 11/5, no loading dose ordered. Continue 1250 mg IV q 24h.   Plan: Vancomycin 1250 mg IV q24h >>Estimated AUC: 514 Unasyn 3g IV q8h Trend WBC, temp, renal function  F/U infectious work-up Drug levels as indicated   Height: 5\' 7"  (170.2 cm) Weight: 53.4 kg (117 lb 11.6 oz) IBW/kg (Calculated) : 66.1  Temp (24hrs), Avg:98.1 F (36.7 C), Min:97.6 F (36.4 C), Max:98.4 F (36.9 C)  Recent Labs  Lab 01/20/21 1144 01/20/21 1420 01/20/21 1547 01/21/21 0027 01/22/21 0013  WBC 7.1  --   --  7.9 7.9  CREATININE 0.68  --   --  0.59* 0.72  LATICACIDVEN  --  2.6* 2.0*  --   --      Estimated Creatinine Clearance: 56.6 mL/min (by C-G formula based on SCr of 0.72 mg/dL).    No Known Allergies  Cathrine Muster, PharmD PGY2 Cardiology Pharmacy Resident Phone: [phone] 01/22/2021  10:27 AM  Please check [email or web address] for unit-specific pharmacy phone numbers.

## 2021-01-22 NOTE — Progress Notes (Addendum)
Family Medicine Teaching Service Daily Progress Note Intern Pager: [phone]  Patient name: Rick Schwartz Medical record number: [id number] Date of birth: 1940/12/08 Age: 80 y.o. Gender: male  Primary Care Provider: Lurline Del, DO Consultants: SLP, Code Status: DNR  Pt Overview and Major Events to Date:  01/20/21: Admitted to [facility] progressive unit 01/21/21: SLP evaluation n.p.o.   Assessment and Plan: Mr. Walling is a 80 y.o. male presenting with shortness of breath admitted for aspiration pneumonia. 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   CAP: Aspiration pneumonia  Sepsis (resolved) Likely aspiration pneumonia given SLP evaluation with moderate aspiration risk yesterday.  Remains afebrile and without leukocytosis.  He is intermittently tachypneic requiring 2 L via nasal cannula.  Continue CAP coverage and vancomycin given positive MRSA swab. -Continue Unasyn (11/5 - ) -Continue vancomycin (11/5 - ) -Restart Azithromycin (11/4, 11/6 -11/7) -Guaifenesin syrup as needed -DuoNebs Q4H as needed -Received ceftriaxone and azithromycin (11/4) -Nasal Bactroban: positive MRSA swab -Follow-up Legionella urinary antigen -Follow-up urine culture: UA unremarkable -wean to room air   Dysphagia  Protein calorie malnutrition Evaluated by SLP yesterday and was considered moderate aspiration risk.  Diet recommendation was n.p.o. with ice chips and crushed medications with pure.  RD saw patient yesterday and recommended core track placement or enteral nutrition. -SLP following, appreciate recommendations :NPO  -RD following, appreciate recommendations   Hypomagnesemia Magnesium 1.6. -Replete with 4 g IV mag -A.m. mag level  Chronic systolic heart failure Stable.  EF less than 20% in July 2022. -Holding home antihypertensive medications, restart if needed -Monitor intake and output and daily  weight -Restart Lasix when able   Cerebral palsy  Spastic Quadriplegia Patient requires total assistance with lifting into wheelchair. -PT/OT: no follow up needed   PAD  HLD  -Continue home Crestor.  FEN/GI: NPO per SLP evaluation, replete electrolytes as needed  PPx: Eliquis  Disposition:   Subjective:  Patient denies pains and states he "feels alright".  Objective: Temp:  [97.6 F (36.4 C)-98.4 F (36.9 C)] 98 F (36.7 C) (11/06 0800) Pulse Rate:  [85-109] 85 (11/06 0800) Resp:  [18-38] 32 (11/06 0800) BP: (106-139)/(66-96) 119/66 (11/06 0800) SpO2:  [90 %-98 %] 97 % (11/06 0800) Weight:  [53.4 kg] 53.4 kg (11/06 0601)  Physical Exam: General: Chronically ill-appearing male in no acute distress Cardiovascular: Regular rate and rhythm Respiratory: Clear to auscultation bilaterally Abdomen: soft, non-tender  Extremities: right AKA, no LE edema   Laboratory: Recent Labs  Lab 01/20/21 1144 01/21/21 0027 01/22/21 0013  WBC 7.1 7.9 7.9  HGB 17.0 16.2 15.5  HCT 53.0* 49.7 49.7  PLT 305 268 239   Recent Labs  Lab 01/20/21 1144 01/21/21 0027 01/22/21 0013  NA 140 143 145  K 3.6 3.6 3.8  CL 110 114* 117*  CO2 21* 21* 16*  BUN 8 6* 9  CREATININE 0.68 0.59* 0.72  CALCIUM 8.9 8.8* 8.7*  PROT 6.8 6.0*  --   BILITOT 1.6* 1.6*  --   ALKPHOS 63 55  --   ALT 10 10  --   AST 18 19  --   GLUCOSE 148* 118* 109*      Imaging/Diagnostic Tests: No results found.    Lyndee Hensen, DO 01/22/2021, 9:02 AM PGY-3, [place] Intern pager: [phone], text pages welcome

## 2021-01-23 ENCOUNTER — Other Ambulatory Visit (HOSPITAL_COMMUNITY): Payer: Self-pay

## 2021-01-23 DIAGNOSIS — J690 Pneumonitis due to inhalation of food and vomit: Secondary | ICD-10-CM | POA: Diagnosis not present

## 2021-01-23 DIAGNOSIS — J189 Pneumonia, unspecified organism: Secondary | ICD-10-CM | POA: Diagnosis not present

## 2021-01-23 LAB — BASIC METABOLIC PANEL
Anion gap: 9 (ref 5–15)
BUN: 16 mg/dL (ref 8–23)
CO2: 20 mmol/L — ABNORMAL LOW (ref 22–32)
Calcium: 9.1 mg/dL (ref 8.9–10.3)
Chloride: 115 mmol/L — ABNORMAL HIGH (ref 98–111)
Creatinine, Ser: 0.73 mg/dL (ref 0.61–1.24)
GFR, Estimated: >60 mL/min (ref >60)
Glucose, Bld: 116 mg/dL — ABNORMAL HIGH (ref 70–99)
Potassium: 3.3 mmol/L — ABNORMAL LOW (ref 3.5–5.1)
Sodium: 144 mmol/L (ref 135–145)

## 2021-01-23 LAB — GLUCOSE, CAPILLARY
Glucose-Capillary: 112 mg/dL — ABNORMAL HIGH (ref 70–99)
Glucose-Capillary: 105 mg/dL — ABNORMAL HIGH (ref 70–99)

## 2021-01-23 LAB — MAGNESIUM: Magnesium: 2.2 mg/dL (ref 1.7–2.4)

## 2021-01-23 LAB — LEGIONELLA PNEUMOPHILA SEROGP 1 UR AG: L. pneumophila Serogp 1 Ur Ag: NEGATIVE

## 2021-01-23 MED ORDER — AMOXICILLIN-POT CLAVULANATE 875-125 MG PO TABS
1.0000 | ORAL_TABLET | Freq: Two times a day (BID) | ORAL | Status: DC
  Administered 2021-01-23 – 2021-01-25 (×5): 1 via ORAL
  Filled 2021-01-23 (×5): qty 1

## 2021-01-23 MED ORDER — FUROSEMIDE 20 MG PO TABS
20.0000 mg | ORAL_TABLET | Freq: Every day | ORAL | Status: DC
  Administered 2021-01-23 – 2021-01-25 (×3): 20 mg via ORAL
  Filled 2021-01-23 (×3): qty 1

## 2021-01-23 MED ORDER — DOXYCYCLINE HYCLATE 100 MG PO TABS
100.0000 mg | ORAL_TABLET | Freq: Two times a day (BID) | ORAL | Status: DC
  Administered 2021-01-23 – 2021-01-25 (×5): 100 mg via ORAL
  Filled 2021-01-23 (×5): qty 1

## 2021-01-23 MED ORDER — POTASSIUM CHLORIDE 20 MEQ PO PACK: 40.0000 meq | PACK | ORAL | Status: DC

## 2021-01-23 MED ORDER — POTASSIUM CHLORIDE 10 MEQ/100ML IV SOLN: 10.0000 meq | INTRAVENOUS | Status: DC

## 2021-01-23 MED ORDER — POTASSIUM CHLORIDE 20 MEQ PO PACK
40.0000 meq | PACK | Freq: Once | ORAL | Status: AC
  Administered 2021-01-23: 40 meq via ORAL
  Filled 2021-01-23: qty 2

## 2021-01-23 NOTE — Progress Notes (Signed)
FPTS Brief Progress Note  S: Patient awake watching tv. RN at bedside. RN notes thick upper airway secretions, notes she needs to assist him with clearing these. She has been successful with encouraging him to cough and suctioning briefly. Rick Schwartz notes he is feeling just fine and has no complaints.   O: BP 115/87   Pulse 83   Temp 98.1 F (36.7 C) (Oral)   Resp 18   Ht 5\' 7"  (1.702 m)   Wt 55.4 kg   SpO2 97%   BMI 19.13 kg/m    A/P: Aspiration pneumonia;improving Briefly weaned to room air, but now back on 2L. SpO2 >92% this evening. Given improvement, day team transitioned to PO antibiotics this morning. Patient afebrile last 24 hours. RN notes thick upper airway secretions, notes she needs to assist him with clearing these.  - flutter valve - mucomist neb PRN    HFrEF less than 20% Patient normotensive this evening. Tolerating restart of lasix well. AM 11/7 weight was up, wonder if error. Overall appears improved.    - Orders reviewed. Labs for AM ordered, which was adjusted as needed.  - If condition changes, plan includes CXR, broadening abx.   Rick Essex, MD 01/24/2021, 2:47 AM PGY-2, [place] [facility] Night Resident  Please page [phone] with questions.

## 2021-01-23 NOTE — Plan of Care (Signed)
  Problem: Education: Goal: Knowledge of General Education information will improve Description: Including pain rating scale, medication(s)/side effects and non-pharmacologic comfort measures Outcome: Not Progressing   Problem: Health Behavior/Discharge Planning: Goal: Ability to manage health-related needs will improve Outcome: Not Progressing   

## 2021-01-23 NOTE — Progress Notes (Signed)
FPTS Brief Progress Note  S:Patient sleeping soundly in bed. I did not awake the patient. Discussed with RN at bedside. No concerns at this time.   O: BP 120/70   Pulse 85   Temp 97.8 F (36.6 C) (Oral)   Resp (!) 24   Ht 5\' 7"  (1.702 m)   Wt 53.4 kg   SpO2 100%   BMI 18.44 kg/m     A/P:  Aspiration pneumonia; improving Normal SPO2 on 2 L nasal cannula.  Afebrile overnight.  Continue with antibiotic plan from day team, no changes at this time. Continue to wean oxygen.   Hypomagnesemia S/p repletion yesterday.  Next check in AM.  Will replete as necessary.   Chronic systolic heart failure; stable Antihypertensive medications held on admission due to soft blood pressures and concern for sepsis.  Patient continues to be normotensive.  Continue holding antihypertensives at this time.  - Orders reviewed. Labs for AM ordered, which was adjusted as needed.    Ezequiel Essex, MD 01/23/2021, 2:31 AM PGY-2, [facility] Night Resident  Please page [phone] with questions.

## 2021-01-23 NOTE — Progress Notes (Signed)
Speech Language Pathology Treatment: Dysphagia  Patient Details Name: Rick Schwartz MRN: [id number] DOB: 1941-03-14 Today's Date: 01/23/2021 Time: 1330-1340 SLP Time Calculation (min) (ACUTE ONLY): 10 min  Assessment / Plan / Recommendation Clinical Impression  Pt demonstrates ability to self feed thin liquids, but he needed repositioning. Pt will continue to be at risk of aspiration given dementia and poor body mechanics. However, today he is tolerating thin liquids well without coughing. He is enjoying sips, but refused any solids, says hes not hungry. Recommend pt continue current diet. No further acute SLP interventions needed at this time.   HPI HPI: Rick Schwartz is a 80 y.o. male presenting with shortness of breath. 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 Previous BSE in July 2022 with recommendations for Dys 2 solids and thin liquid. CXR concerning for aspiration PNA vs infection.      SLP Plan  Continue with current plan of care      Recommendations for follow up therapy are one component of a multi-disciplinary discharge planning process, led by the attending physician.  Recommendations may be updated based on patient status, additional functional criteria and insurance authorization.    Recommendations  Diet recommendations: Thin liquid;Dysphagia 1 (puree) Liquids provided via: Straw;Cup Medication Administration: Crushed with puree Supervision: Trained caregiver to feed patient;Full supervision/cueing for compensatory strategies Compensations: Slow rate;Small sips/bites;Minimize environmental distractions Postural Changes and/or Swallow Maneuvers: Seated upright 90 degrees                Oral Care Recommendations: Oral care before and after PO;Oral care BID;Staff/trained caregiver to provide oral care Follow up Recommendations: Skilled Nursing facility SLP Visit Diagnosis: Dysphagia,  oropharyngeal phase (R13.12) Plan: Continue with current plan of care       GO                Brianah Hopson, Katherene Ponto  01/23/2021, 2:34 PM

## 2021-01-23 NOTE — TOC Benefit Eligibility Note (Signed)
Patient Advocate Encounter ° °Insurance verification completed.   ° °The patient is currently admitted and upon discharge could be taking Farxiga 10 mg. ° °The current 30 day co-pay is, $0.00.  ° °The patient is currently admitted and upon discharge could be taking Jardiance 10 mg. ° °The current 30 day co-pay is, $0.00.  ° °The patient is insured through [employer] Medicare Part D  ° ° °Elza Varricchio, CPhT °Pharmacy Patient Advocate Specialist °[facility] Patient Advocate Team °Direct Number: [phone]  Fax: [phone] ° ° ° ° ° °  °

## 2021-01-23 NOTE — Progress Notes (Signed)
Family Medicine Teaching Service Daily Progress Note Intern Pager: [phone]  Patient name: Rick Schwartz Medical record number: [id number] Date of birth: October 25, 1940 Age: 80 y.o. Gender: male  Primary Care Provider: Lurline Del, DO Consultants: SLP Code Status: DNR  Pt Overview and Major Events to Date:  01/20/21 Admitted to [place] 11/5 SLP eval  Assessment and Plan:  Mr. Halteman is a 80 year old male presenting with shortness of breath admitted for aspiration pneumonia.  Past medical history significant for spastic quadriplegia, HFrEF, PE on Eliquis, dementia, prostate cancer, hyperlipidemia, hypertension and PAD.  Aspiration pneumonia  sepsis (resolved) Afebrile overnight saturating in high 90s on 2 L of oxygen, weaned him to RA, satting in high 90s.  Expiratory wheezes and course crackles on exam today. MRSA positive swab. Legionella in process.  Procalcitonin is <0.10. Urine culture no growth to date, blood culture NGTD. Vancomycin (11/5-11/6) azithromycin (11/4, 11/6-11/7). -oxygenation prn -Unasyn (11/5-11/7) transition to PO augmentin and doxycycline course for 5 additional days for total 7 day course. Per SLP ok with crushed meds -nasal bactroban for positive MRSA swab -guaifenesin syrup prn -DuoNebs every 4 hours prn -f/u legionella; urine culture -consider repeat chest x ray -consider steroid course if wheezing continues with PO abx  Dysphagia  protein calorie malnutrition SLP diet recommendations dysphagia 1 thin liquids. Crus meds.  -RD following appreciate recommendations Cortrak and intiation of enteral nutrition, will continue with thin liquids per SLP  HFrEF less than 20% Weight up today 122.14 and yesterday 117.   -holding home antihypertensives -I/O's  -restart lasix today Cr stable -stop unasyn  Cerebral Palsy Patient requires total assistance  -PT/OT   PAD  hyperlipidemia -Home crestor  Hypomagnesemia, resolved  2.2 magnesium this AM  FEN/GI:  Dysphagia 1  PPx: Eliquis 5 mg BID Dispo: pending clinical improvement   Subjective:  No complaints this AM  Objective: Temp:  [97.7 F (36.5 C)-98 F (36.7 C)] 97.9 F (36.6 C) (11/07 0300) Pulse Rate:  [81-97] 86 (11/07 0600) Resp:  [13-32] 15 (11/07 0600) BP: (98-131)/(65-99) 108/71 (11/07 0554) SpO2:  [91 %-100 %] 98 % (11/07 0600) Weight:  [55.4 kg] 55.4 kg (11/07 0333) Physical Exam: General: NAD, laying in bed responsive to  Cardiovascular: RRR no m/r/g Respiratory: Coarse crackles throughout with expiratory wheezes, no retractions, saturating well on RA Abdomen: Nondistended nontender to palpation Extremities: Moves freely  Laboratory: Recent Labs  Lab 01/20/21 1144 01/21/21 0027 01/22/21 0013  WBC 7.1 7.9 7.9  HGB 17.0 16.2 15.5  HCT 53.0* 49.7 49.7  PLT 305 268 239   Recent Labs  Lab 01/20/21 1144 01/21/21 0027 01/22/21 0013 01/23/21 0205  NA 140 143 145 144  K 3.6 3.6 3.8 3.3*  CL 110 114* 117* 115*  CO2 21* 21* 16* 20*  BUN 8 6* 9 16  CREATININE 0.68 0.59* 0.72 0.73  CALCIUM 8.9 8.8* 8.7* 9.1  PROT 6.8 6.0*  --   --   BILITOT 1.6* 1.6*  --   --   ALKPHOS 63 55  --   --   ALT 10 10  --   --   AST 18 19  --   --   GLUCOSE 148* 118* 109* 116*    Imaging/Diagnostic Tests:   Gerrit Heck, MD 01/23/2021, 6:22 AM PGY-1, [place] Intern pager: [phone], text pages welcome

## 2021-01-24 DIAGNOSIS — J690 Pneumonitis due to inhalation of food and vomit: Secondary | ICD-10-CM | POA: Diagnosis not present

## 2021-01-24 DIAGNOSIS — J189 Pneumonia, unspecified organism: Secondary | ICD-10-CM | POA: Diagnosis not present

## 2021-01-24 LAB — BASIC METABOLIC PANEL
Anion gap: 10 (ref 5–15)
BUN: 12 mg/dL (ref 8–23)
CO2: 21 mmol/L — ABNORMAL LOW (ref 22–32)
Calcium: 9.1 mg/dL (ref 8.9–10.3)
Chloride: 111 mmol/L (ref 98–111)
Creatinine, Ser: 0.71 mg/dL (ref 0.61–1.24)
GFR, Estimated: >60 mL/min (ref >60)
Glucose, Bld: 98 mg/dL (ref 70–99)
Potassium: 3.1 mmol/L — ABNORMAL LOW (ref 3.5–5.1)
Sodium: 142 mmol/L (ref 135–145)

## 2021-01-24 LAB — GLUCOSE, CAPILLARY
Glucose-Capillary: 108 mg/dL — ABNORMAL HIGH (ref 70–99)
Glucose-Capillary: 123 mg/dL — ABNORMAL HIGH (ref 70–99)
Glucose-Capillary: 89 mg/dL (ref 70–99)
Glucose-Capillary: 104 mg/dL — ABNORMAL HIGH (ref 70–99)

## 2021-01-24 LAB — MAGNESIUM: Magnesium: 1.9 mg/dL (ref 1.7–2.4)

## 2021-01-24 MED ORDER — METOPROLOL SUCCINATE ER 25 MG PO TB24
12.5000 mg | ORAL_TABLET | Freq: Every day | ORAL | Status: DC
  Administered 2021-01-24 – 2021-01-25 (×2): 12.5 mg via ORAL
  Filled 2021-01-24 (×3): qty 1

## 2021-01-24 MED ORDER — POTASSIUM CHLORIDE 20 MEQ PO PACK
40.0000 meq | PACK | Freq: Once | ORAL | Status: AC
  Administered 2021-01-24: 40 meq via ORAL
  Filled 2021-01-24: qty 2

## 2021-01-24 MED ORDER — ACETYLCYSTEINE 20 % IN SOLN
2.0000 mL | RESPIRATORY_TRACT | Status: DC | PRN
  Filled 2021-01-24: qty 4

## 2021-01-24 NOTE — Progress Notes (Signed)
Family Medicine MD stopped by the patient's room performing night rounds.  Informed them that when suctioning the back of his throat earlier able to get a lot of phelm out.  But patient would benefit drom a flutter valve and mucomyst as needed treatments.  The patient has been spitting and trying to cough and deep breathe with minor results at this time

## 2021-01-24 NOTE — Plan of Care (Signed)
Discussed with patient plan of care for the evening, pain management and oral suctioning with some teach back displayed  Problem: Education: Goal: Knowledge of General Education information will improve Description: Including pain rating scale, medication(s)/side effects and non-pharmacologic comfort measures Outcome: Progressing   Problem: Health Behavior/Discharge Planning: Goal: Ability to manage health-related needs will improve Outcome: Progressing

## 2021-01-24 NOTE — Progress Notes (Addendum)
Family Medicine Teaching Service Daily Progress Note Intern Pager: [phone]  Patient name: Rick Schwartz Medical record number: [id number] Date of birth: Jun 04, 1940 Age: 80 y.o. Gender: male  Primary Care Provider: Lurline Del, DO Consultants: SLP Code Status: DNR  Pt Overview and Major Events to Date:  11/4 Admitted to [facility] 11/5 SLP eval  Assessment and Plan:  Rick Schwartz is a 80 year old male presenting with shortness of breath admitted for aspiration pneumonia.  Past medical history significant for spastic quadriplegia, HFrEF, PE on Eliquis, dementia, prostate cancer, hyperlipidemia, hypertension and PAD  Aspiration pneumonia  sepsis, resolved Last fever 11/4 of 100.7, afebrile since then. Was on 2L satting high 90s. Has been on room air this morning saturating in high 90s. Lungs sounded coarse crackles and rales throughout, no wheezes.  Legionella was negative.  Urine culture no growth to date, blood culture no growth to date.  -Oxygenation as needed -Augmentin twice daily day 2/5 -Doxycycline 100 mg twice daily day 2/5 -Bactroban nasal twice daily day 4/5 -guanifesin as needed -DuoNebs every 4 hours as needed -Consider repeat chest x-ray -Consider steroid course if wheezing  Dysphagia  protein calorie malnutrition SLP diet recommendations dysphagia 1 diet.  Crushed meds. -Monitor  HFrEF less than 20% Weight same as yesterday.  -restart home metoprolol today -consider SGLT2 outpatient -I's/O's -Lasix restarted yesterday, creatinine stable  Hypomagnesemia 1.9 this morning  -Monitor  Hypokalemia K 3.1  -repleted this AM 40 meq packet x2 -monitor on BMP  Cerebral palsy Patient requires total assistance -PT/OT  PAD  hyperlipidemia -Continue home Crestor  FEN/GI: Dysphagia 1 PPx:  Dispo:Total assist at home hoyer lift recommended, likely close to baseline  Subjective:  No complaints this AM  Objective: Temp:  [97.2 F (36.2 C)-98.1 F (36.7 C)]  98 F (36.7 C) (11/08 0300) Pulse Rate:  [63-91] 70 (11/08 0600) Resp:  [14-32] 14 (11/08 0600) BP: (108-136)/(53-96) 120/65 (11/08 0600) SpO2:  [90 %-100 %] 91 % (11/08 0600) Weight:  [55.4 kg] 55.4 kg (11/08 0339) Physical Exam: General: NAD, lying in bed, responsive to questions Cardiovascular: RRR no m/r/g Respiratory: Coarse crackles and rales throughout, no retractions, no consolidation on exam, on RA speaking full sentences Abdomen: Nondistended, nontender to palpation Extremities: R AKA, no LE edema, pulses intact  Laboratory: Recent Labs  Lab 01/20/21 1144 01/21/21 0027 01/22/21 0013  WBC 7.1 7.9 7.9  HGB 17.0 16.2 15.5  HCT 53.0* 49.7 49.7  PLT 305 268 239   Recent Labs  Lab 01/20/21 1144 01/21/21 0027 01/22/21 0013 01/23/21 0205 01/24/21 0030  NA 140 143 145 144 142  K 3.6 3.6 3.8 3.3* 3.1*  CL 110 114* 117* 115* 111  CO2 21* 21* 16* 20* 21*  BUN 8 6* 9 16 12   CREATININE 0.68 0.59* 0.72 0.73 0.71  CALCIUM 8.9 8.8* 8.7* 9.1 9.1  PROT 6.8 6.0*  --   --   --   BILITOT 1.6* 1.6*  --   --   --   ALKPHOS 63 55  --   --   --   ALT 10 10  --   --   --   AST 18 19  --   --   --   GLUCOSE 148* 118* 109* 116* 98    Imaging/Diagnostic Tests:   Gerrit Heck, MD 01/24/2021, 6:37 AM PGY-1, [place] Intern pager: [phone], text pages welcome

## 2021-01-25 DIAGNOSIS — J690 Pneumonitis due to inhalation of food and vomit: Secondary | ICD-10-CM | POA: Diagnosis not present

## 2021-01-25 LAB — GLUCOSE, CAPILLARY
Glucose-Capillary: 105 mg/dL — ABNORMAL HIGH (ref 70–99)
Glucose-Capillary: 110 mg/dL — ABNORMAL HIGH (ref 70–99)

## 2021-01-25 LAB — CULTURE, BLOOD (ROUTINE X 2)
Culture: NO GROWTH
Culture: NO GROWTH
Special Requests: ADEQUATE
Special Requests: ADEQUATE

## 2021-01-25 LAB — BASIC METABOLIC PANEL
Anion gap: 9 (ref 5–15)
BUN: 11 mg/dL (ref 8–23)
CO2: 22 mmol/L (ref 22–32)
Calcium: 9.2 mg/dL (ref 8.9–10.3)
Chloride: 111 mmol/L (ref 98–111)
Creatinine, Ser: 0.7 mg/dL (ref 0.61–1.24)
GFR, Estimated: >60 mL/min (ref >60)
Glucose, Bld: 95 mg/dL (ref 70–99)
Potassium: 4.4 mmol/L (ref 3.5–5.1)
Sodium: 142 mmol/L (ref 135–145)

## 2021-01-25 LAB — MAGNESIUM: Magnesium: 1.7 mg/dL (ref 1.7–2.4)

## 2021-01-25 MED ORDER — ADULT MULTIVITAMIN W/MINERALS CH
1.0000 | ORAL_TABLET | Freq: Every day | ORAL | Status: DC
  Administered 2021-01-25: 1 via ORAL
  Filled 2021-01-25: qty 1

## 2021-01-25 MED ORDER — GERHARDT'S BUTT CREAM
TOPICAL_CREAM | Freq: Two times a day (BID) | CUTANEOUS | Status: DC
  Filled 2021-01-25: qty 1

## 2021-01-25 MED ORDER — POTASSIUM CHLORIDE 20 MEQ PO PACK
40.0000 meq | PACK | Freq: Once | ORAL | Status: AC
  Administered 2021-01-25: 40 meq via ORAL
  Filled 2021-01-25: qty 2

## 2021-01-25 MED ORDER — AMOXICILLIN-POT CLAVULANATE 875-125 MG PO TABS: 1.0000 | ORAL_TABLET | Freq: Two times a day (BID) | ORAL | 0 refills | Status: AC

## 2021-01-25 MED ORDER — ROSUVASTATIN CALCIUM 20 MG PO TABS: 20.0000 mg | ORAL_TABLET | Freq: Every day | ORAL | 1 refills | Status: DC

## 2021-01-25 MED ORDER — DOXYCYCLINE HYCLATE 100 MG PO TABS: 100.0000 mg | ORAL_TABLET | Freq: Two times a day (BID) | ORAL | 0 refills | Status: DC

## 2021-01-25 MED ORDER — ENSURE ENLIVE PO LIQD
237.0000 mL | Freq: Three times a day (TID) | ORAL | Status: DC
  Administered 2021-01-25: 237 mL via ORAL

## 2021-01-25 MED ORDER — MAGNESIUM SULFATE 2 GM/50ML IV SOLN
2.0000 g | Freq: Once | INTRAVENOUS | Status: AC
  Administered 2021-01-25: 2 g via INTRAVENOUS
  Filled 2021-01-25: qty 50

## 2021-01-25 NOTE — Discharge Instructions (Addendum)
Dear Rick Schwartz,   Thank you so much for allowing [place] to be part of your care!  You were admitted to [facility] for aspiration pneumonia.    POST-HOSPITAL & CARE INSTRUCTIONS Sit upright while eating and make sure you are alert when eating and a soft diet. Please take amoxicillin and doxycycline 1 dose tonight. Take twice a day after this until 11/11 Please got to PCP appointment on 11/11 - at PCP appointment ask your doctor about restarting losartan and starting Jardiance for your heart! See attached information on Potassium-content in foods. If you are able, eat more foods with potassium to limit how much supplementation you need.  Please let PCP/Specialists know of any changes that were made.  Please see medications section of this packet for any medication changes.   DOCTOR'S APPOINTMENT & FOLLOW UP CARE INSTRUCTIONS  Future Appointments  Date Time Provider Christine  01/27/2021  9:10 AM ACCESS TO CARE POOL [facility] [place]  02/02/2021 10:30 AM Leavy Cella, RPH-CPP [facility] [place]    RETURN PRECAUTIONS: -If worsening breathing or fevers  Take care and be well!  [facility]  [place], [place] [address] [phone]

## 2021-01-25 NOTE — Progress Notes (Signed)
FPTS Brief Progress Note  S: Patient found laying comfortably in bed. RN at bedside preparing to clean patient after bowel movement. Patient feels well and has no complaints. Feels like his chest congestion is doing better after the nebulizer and flutter valve.   O: BP 125/85 (BP Location: Left Arm)   Pulse 74   Temp 97.6 F (36.4 C) (Axillary)   Resp 19   Ht 5\' 7"  (1.702 m)   Wt 55.4 kg   SpO2 95%   BMI 19.13 kg/m    A/P: Aspiration pneumonia; resolved Afebrile last 24 hours. Doing well with normal spO2 on 2L O2.  - wean as tolerated - continue mucomist PRN congestion - continue flutter valve PRN for congestion - likely home 11/9   HFrEF less than 20% Lasix restarted 11/7. Next Cr check on AM BMP.   Hypomagnesemia Mag lab in AM.    Hypokalemia Next check on AM BMP.   - Orders reviewed. Labs for AM ordered, which was adjusted as needed.  - If condition changes, plan includes adjustment of respiratory medication and treatment plan.   Ezequiel Essex, MD 01/25/2021, 1:30 AM PGY-2, [place] Night Resident  Please page [phone] with questions.

## 2021-01-25 NOTE — Plan of Care (Signed)
  Problem: Clinical Measurements: Goal: Respiratory complications will improve Outcome: Progressing   Problem: Activity: Goal: Risk for activity intolerance will decrease Outcome: Progressing   

## 2021-01-25 NOTE — Progress Notes (Signed)
Nutrition Follow-up  DOCUMENTATION CODES:   Underweight, Severe malnutrition in context of chronic illness  INTERVENTION:   - Ensure Enlive po TID, each supplement provides 350 kcal and 20 grams of protein  - MVI with minerals daily  - Feeding assistance with all meals  NUTRITION DIAGNOSIS:   Severe Malnutrition related to chronic illness (CHF) as evidenced by severe muscle depletion, severe fat depletion.  New diagnosis after completion of NFPE  GOAL:   Patient will meet greater than or equal to 90% of their needs  Progressing  MONITOR:   PO intake, Supplement acceptance, Labs, Weight trends, Skin, I & O's  REASON FOR ASSESSMENT:   Consult Assessment of nutrition requirement/status  ASSESSMENT:   80 year old male who presented to the ED on 11/04 with SOB, cough, difficulty swallowing. 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 Pt admitted with sepsis, aspiration PNA.  11/05 - SLP evaluation with recommendations for NPO 11/06 - diet advanced to dysphagia 1 with thin liquids  Per notes, possible discharge home with sister today or early morning tomorrow. Discussed pt with RN. Pt eating well when he is fed his meals. RD to add Ensure supplements to aid pt in meeting kcal and protein needs. Will also add daily MVI with minerals.  Spoke with pt at bedside. Unable to obtain diet or weight history from pt at this time. Pt reports that he does like Ensure shakes and does not have a flavor preference.  Admit weight: 54 kg Current weight: 54 kg  Meal Completion: 10-80%  Medications reviewed and include: lasix, IV magnesium sulfate 2 grams once  Labs reviewed. CBG's: 89-123 x 24 hours  UOP: 550 ml x 12 hours I/O's: +1.5 L since admit  NUTRITION - FOCUSED PHYSICAL EXAM:  Flowsheet Row Most Recent Value  Orbital Region Severe depletion  Upper Arm Region Severe depletion  Thoracic and Lumbar Region  Severe depletion  Buccal Region Severe depletion  Temple Region Severe depletion  Clavicle Bone Region Severe depletion  Clavicle and Acromion Bone Region Severe depletion  Scapular Bone Region Severe depletion  Dorsal Hand Moderate depletion  Patellar Region Moderate depletion  Anterior Thigh Region Severe depletion  Posterior Calf Region Moderate depletion  Edema (RD Assessment) None  Hair Reviewed  Eyes Reviewed  Mouth Reviewed  Skin Reviewed  Nails Reviewed       Diet Order:   Diet Order             DIET - DYS 1 Room service appropriate? Yes; Fluid consistency: Thin  Diet effective now                   EDUCATION NEEDS:   Not appropriate for education at this time  Skin:  Skin Assessment: Skin Integrity Issues: Stage II: scrotum Stage III: L heel  Last BM:  01/24/21 large type 6  Height:   Ht Readings from Last 1 Encounters:  01/20/21 5\' 7"  (1.702 m)    Weight:   Wt Readings from Last 1 Encounters:  01/25/21 54 kg    BMI:  Body mass index is 18.65 kg/m.  Estimated Nutritional Needs:   Kcal:  1650-1850  Protein:  75-90 grams  Fluid:  1.6-1.8 L    Gustavus Bryant, MS, RD, LDN Inpatient Clinical Dietitian Please see AMiON for contact information.

## 2021-01-25 NOTE — Discharge Summary (Addendum)
[facility] Discharge Summary  Patient name: Rick Schwartz Medical record number: [id number] Date of birth: 03/19/1941 Age: 80 y.o. Gender: male Date of Admission: 01/20/2021  Date of Discharge: 01/25/2021 Admitting Physician: Holley Bouche, MD  Primary Care Provider: Lurline Del, DO Consultants: SLP  Indication for Hospitalization: Sepsis from Aspiration PNA  Discharge Diagnoses/Problem List:  Sepsis Aspiration pneumonia  Disposition: Home  Discharge Condition: Stable  Discharge Exam:  General: NAD, awake, alert, responsive to questions, A&Ox4 CV: Regular rate and rhythm no murmurs rubs or gallops Respiratory: Coarse breath sounds throughout bilaterally, no wheezes, chest rises symmetrically,  no increased work of breathing on room air Abdomen: Soft, non-tender, non-distended, normoactive bowel sounds  Extremities: no edema in LLE, R AKA  Neuro: No focal deficits Skin: No rashes or lesions visualized   Brief Hospital Course:  Rick Schwartz is a 80 y.o. male presenting with shortness of breath . PMH is significant for Spastic Quadriplegia, HFrEF (EF < 20% 7/22), PE on Eliquis, dementia, Prostate Cancer, HLD, HTN, PAD.   Aspiration PNA  Sepsis, resolved Patient presented with shortness of breath for 3-4 days, with cough and history of choking on food. In the ED his RR was 43, with a temperature of 100.7 and a BP on 103/58. Labs were significant for a lactic acid of 2.6, with slightly elevated PT 18.2, and slightly elevated INR of 1.5, with a K at 3.4, a T. Billi of 1.6. On Imaging, CXR showed minimal diffuse interstitial opacity that may reflect mild edema or infection. Patient was started on ceftriaxone, azithromycin, and given a 1L fluid bolus. DuoNebs every 4 hours prn were ordered but low suspicion for COPD during hospitalization given lung examinations. MRSA nasal swab was positive, he completed a 5 day course of  nasal bactroban. Legionella Ag  was negative. Received ceftriaxone and azithromycin (11/4) Azithromycin (11/4, restarted 11/6 -11/7) Vancomycin (11/5-11/6) Unasyn (11/5-11/7). He was then transitioned to PO augmentin and doxycycline course for 7 day total and discharged with 2 additional days. At end of hospitalization patient was afebrile and not meeting sepsis criteria, with blood and urine cultures showing no growth. Prior to discharge he was stable on room air throughout the day without desaturations.    HFrEF EF < 20% Patient with history of CHF, Last echo in July of 22 showed < 20% EF. Patient taking lasix 20 mg, losartan 25 mg, metoprolol succinate 25 mg at home. Patient Euvolemic. Losartan was held due to soft blood pressures. Metoprolol and lasix were continued.   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 States he does not follow with neurology. Family not interested in SNF and patient to continue living under care of sister at home. Sent DME order for hoyer lift.  Oliguria, resolved During hospitalization patient with documented decreased urine output. Bladder scan performed which did not show urinary retention. By time of discharge patient was urinating without difficulty.   Issues for Follow Up:  Consider starting SGLT outpatient for heart failure Restart Losartan outpatient for HFrEF when appropriated Consider potassium 20 meq daily Assess crestor 20 mg daily adherance Given hx of smoking, schedule PFTs with Dr. Valentina Lucks (20 year smoking hx )   Significant Procedures: None  Significant Labs and Imaging:  No results for input(s): WBC, HGB, HCT, PLT in the last 168 hours.  Recent Labs  Lab 01/24/21 0030 01/25/21 0701  NA 142 142  K  3.1* 4.4  CL 111 111  CO2 21* 22  GLUCOSE 98 95  BUN 12 11  CREATININE 0.71 0.70  CALCIUM 9.1 9.2  MG 1.9 1.7    Results/Tests Pending at Time of Discharge:    Discharge Medications:  Allergies as of 01/25/2021   No Known Allergies      Medication List     STOP taking these medications    losartan 25 MG tablet Commonly known as: COZAAR       TAKE these medications    acetaminophen 325 MG tablet Commonly known as: TYLENOL Take 2 tablets (650 mg total) by mouth every 6 (six) hours. What changed:  when to take this reasons to take this   apixaban 5 MG Tabs tablet Commonly known as: ELIQUIS Take 1 tablet (5 mg total) by mouth 2 (two) times daily.   doxycycline 100 MG tablet Commonly known as: VIBRA-TABS Take 1 tablet (100 mg total) by mouth every 12 (twelve) hours.   furosemide 20 MG tablet Commonly known as: LASIX Take 1 tablet (20 mg total) by mouth daily.   metoprolol succinate 25 MG 24 hr tablet Commonly known as: TOPROL-XL Take 0.5 tablets (12.5 mg total) by mouth daily.   MUCINEX DM PO Take 1 tablet by mouth 2 (two) times daily as needed (cough).   potassium chloride 10 MEQ CR capsule Commonly known as: MICRO-K Take 1 capsule (10 mEq total) by mouth daily.   rosuvastatin 20 MG tablet Commonly known as: Crestor Take 1 tablet (20 mg total) by mouth daily.   Tussin 100 MG/5ML liquid Generic drug: guaiFENesin Take 100 mg by mouth every 4 (four) hours as needed for cough or to loosen phlegm.       ASK your doctor about these medications    amoxicillin-clavulanate 875-125 MG tablet Commonly known as: AUGMENTIN Take 1 tablet by mouth every 12 (twelve) hours for 2 days. Ask about: Should I take this medication?        Discharge Instructions: Please refer to Patient Instructions section of EMR for full details.  Patient was counseled important signs and symptoms that should prompt return to medical care, changes in medications, dietary instructions, activity restrictions, and follow up appointments.   Follow-Up Appointments:  Follow-up [place]. Go on  01/27/2021.   Specialty: Family Medicine Why: At 9:10 am. Please arrive by 8:55 am. This is your hospital follow up with your family medicine clinic. If this day and time does not work well for you, please call the clinic directly to reschedule. Contact information: [address] [id number] [place]. Go on 02/02/2021.   Specialty: Family Medicine Why: At 10:30 am. Please arrive by 10:15 am. This is your lung function testing at the family medicine clinic with our clinical pharmacist, Dr. Valentina Lucks. If this day and time does not work well for you, please call the office directly to reschedule. Contact information: [address] [id number] [place]                Gerrit Heck, MD 01/25/2021, 6:35 PM PGY-1, [place] Upper-Level Resident Addendum   I have independently interviewed and examined the patient. I have discussed the above with the original author and agree with their documentation. My edits for correction/addition/clarification are in within the document. Please see also  any attending notes.   Shary Key, DO PGY-2, [place] [facility] 01/30/2021 2:30 PM  [place] Service pager: [phone] (text pages welcome through [place])

## 2021-01-25 NOTE — Progress Notes (Signed)
Family Medicine Teaching Service Daily Progress Note Intern Pager: [phone]  Patient name: DENZELL COLASANTI Medical record number: [id number] Date of birth: 07-Jan-1941 Age: 80 y.o. Gender: male  Primary Care Provider: Lurline Del, DO Consultants: SLP Code Status: DNR  Pt Overview and Major Events to Date:  11/4 admitted to F PTS 11/5 SLP evaluation  Assessment and Plan:  Mr. Latulippe is a 80 year old male presenting with shortness of breath admitted for aspiration pneumonia.  Past medical history significant for spastic quadriplegia HFrEF, PE on Eliquis, dementia, prostate cancer, hyperlipidemia, hypertension and PAD  Aspiration pneumonia  sepsis, resolved Afebrile since 11/4.  Was on 1L saturating in high 90s, switch him to RA saturating mid 90s. Lungs sound better than previous but has productive sounding cough, no consolidation, coarse breath sounds.  -oxygen prn, monitor how he does on RA today -flutter valve -mucomist -augmentin BID day 3/5 -doxycycline 100 mg BID day 3/5 -bactroban nasal BID day 5/5 -gaunifesin prn -duonebs q4h  Dysphagia SLP diet recs dysphagia 1, crushed meds  Oliguria No abdominal pain expressed. UOP 0.4 ml/kg/hr but may not have been completely documented, -reach out to nursing on output documentation -strict I's and O's -bladder scans  HFrEF <20% Weigh 119 from 122. HR normal in 70s to 80s. BP soft 110/62. -metoprolol -consider sglt2 outpatient -I's/O's -Lasix, Cr stable -losartan outpatient restart when appropriate given   Hypomagnesemia Today Magnesium 1.7 -replete 2 gram IV  Hypokalemia K normal 4.4 this AM  CP Requires total assistance -talked to sister today about living situation  PAD  hyperlipidemia -Continue home Crestor  FEN/GI: Dysphagia 1 PPx:  Dispo: Possible d/c home with sister today or early morning tomorrow, spoke with her today. Monitor respiratory status on RA, UOP.  Subjective:  No complaints this AM.  Spoke with sister, will touch base with her before discharge. All questions answered.  Objective: Temp:  [97.6 F (36.4 C)-98 F (36.7 C)] 97.7 F (36.5 C) (11/09 0300) Pulse Rate:  [74-90] 85 (11/09 0300) Resp:  [16-22] 20 (11/09 0300) BP: (104-131)/(64-85) 114/67 (11/09 0300) SpO2:  [94 %-97 %] 95 % (11/09 0300) Weight:  [54 kg] 54 kg (11/09 0610) Physical Exam: General: NAD A&Ox4 Cardiovascular: RRR no m/r/g Respiratory: Coarse breath sounds throughout, no iWOB on RA, productive sounding cough,  no wheezes Abdomen: Nontender, nondistended Extremities: R AKA, LLE no edema  Laboratory: Recent Labs  Lab 01/20/21 1144 01/21/21 0027 01/22/21 0013  WBC 7.1 7.9 7.9  HGB 17.0 16.2 15.5  HCT 53.0* 49.7 49.7  PLT 305 268 239   Recent Labs  Lab 01/20/21 1144 01/21/21 0027 01/22/21 0013 01/23/21 0205 01/24/21 0030  NA 140 143 145 144 142  K 3.6 3.6 3.8 3.3* 3.1*  CL 110 114* 117* 115* 111  CO2 21* 21* 16* 20* 21*  BUN 8 6* 9 16 12   CREATININE 0.68 0.59* 0.72 0.73 0.71  CALCIUM 8.9 8.8* 8.7* 9.1 9.1  PROT 6.8 6.0*  --   --   --   BILITOT 1.6* 1.6*  --   --   --   ALKPHOS 63 55  --   --   --   ALT 10 10  --   --   --   AST 18 19  --   --   --   GLUCOSE 148* 118* 109* 116* 98    Imaging/Diagnostic Tests:   Gerrit Heck, MD 01/25/2021, 7:18 AM PGY-1, [place] Intern pager: [phone], text pages welcome

## 2021-01-25 NOTE — TOC Initial Note (Signed)
Transition of Care [facility]) - Initial/Assessment Note    Patient Details  Name: Rick Schwartz MRN: [id number] Date of Birth: December 27, 1940  Transition of Care [facility]) CM/SW Contact:    Carles Collet, RN Phone Number: 01/25/2021, 1:19 PM  Clinical Narrative:        Damaris Schooner w [name] over the phone. She verifies that the patient lives with her and her spouse and granddaughter.  She confirms that they have a hospital bed, 3/1, WC for him at home. She states that they do a lot of lifting for him and we discussed a hoyer. Initially she declined but then agreed to have one ordered as she wasn't certain that it would or wouldn't fit in room.  We discussed [facility] services, and she states that they have had it in the past and it really isn't that helpful. We discussed transportation, and she declined needing assistance at DC, she feels they can get him in and out of the car as they usually do. TOC will continue to follow.             Expected Discharge Plan: Home/Self Care Barriers to Discharge: Continued Medical Work up   Patient Goals and CMS Choice Patient states their goals for this hospitalization and ongoing recovery are:: spoke w sister, goal is to return home   Choice offered to / list presented to : [facility] POA / Guardian  Expected Discharge Plan and Services Expected Discharge Plan: Home/Self Care   Discharge Planning Services: CM Consult Post Acute Care Choice: Durable Medical Equipment Living arrangements for the past 2 months: Single Family Home                 DME Arranged:  (hoyer lift) DME Agency: [employer] Date DME Agency Contacted: 01/25/21 Time DME Agency Contacted: 83 Representative spoke with at DME Agency: Freda Munro HH Arranged: Refused HH          Prior Living Arrangements/Services Living arrangements for the past 2 months: [place] Lives with:: Relatives                   Activities of Daily Living Home Assistive Devices/Equipment:  Wheelchair ADL Screening (condition at time of admission) Patient's cognitive ability adequate to safely complete daily activities?: Yes Is the patient deaf or have difficulty hearing?: No Does the patient have difficulty seeing, even when wearing glasses/contacts?: Yes Does the patient have difficulty concentrating, remembering, or making decisions?: No Patient able to express need for assistance with ADLs?: Yes Does the patient have difficulty dressing or bathing?: Yes Independently performs ADLs?: No Communication: Independent Dressing (OT): Needs assistance Is this a change from baseline?: Pre-admission baseline Grooming: Needs assistance Is this a change from baseline?: Pre-admission baseline Feeding: Needs assistance Is this a change from baseline?: Pre-admission baseline Bathing: Needs assistance Is this a change from baseline?: Pre-admission baseline Toileting: Needs assistance Is this a change from baseline?: Pre-admission baseline In/Out Bed: Needs assistance Is this a change from baseline?: Pre-admission baseline Walks in Home: Needs assistance Is this a change from baseline?: Pre-admission baseline Does the patient have difficulty walking or climbing stairs?: Yes Weakness of Legs: Both Weakness of Arms/Hands: Both  Permission Sought/Granted                  Emotional Assessment              Admission diagnosis:  Aspiration pneumonia ([place]) [J69.0] Pneumonia [J18.9] Community acquired pneumonia, unspecified laterality [J18.9] Sepsis, due to unspecified organism,  unspecified whether acute organ dysfunction present [facility]) [A41.9] Patient Active Problem List   Diagnosis Date Noted   Aspiration pneumonia ([place]) 01/20/2021   Pneumonia 01/20/2021   Community acquired pneumonia    Acute respiratory failure with hypoxemia ([place])    Spastic quadriplegic cerebral palsy ([place])    COVID-19 11/22/2020   Chronic HFrEF (heart failure with reduced ejection fraction)  ([place])    Dementia without behavioral disturbance ([place])    Pressure injury of skin 09/22/2020   Protein-calorie malnutrition, severe 09/22/2020   Right pulmonary embolus ([place]) 09/21/2020   Pulmonary embolus ([place]) 09/21/2020   Shortness of breath    Pleural effusion, bilateral    SBO (small bowel obstruction) ([place])    Inanition ([place])    Foot ulcer with necrosis of muscle, right ([place]) 07/18/2020   Sepsis ([place]) 07/18/2020   Lactic acidosis 07/18/2020   Hypernatremia 07/18/2020   Hypercalcemia 07/18/2020   Protein-calorie malnutrition ([place])    Gangrene of right foot ([place]) 07/17/2020   Focal motor deficit 07/07/2020   Physical deconditioning 05/06/2020   Prediabetes 07/20/2016   Cough 01/21/2012   HALLUX RIGIDUS, ACQUIRED 04/11/2010   PROSTATE CANCER 09/18/2007   GLUCOSE INTOLERANCE 05/16/2006   HYPERCHOLESTEROLEMIA 05/16/2006   Infantile cerebral palsy ([place]) 05/16/2006   HYPERTENSION, BENIGN SYSTEMIC 05/16/2006   BPH 05/16/2006   PCP:  Lurline Del, DO Pharmacy:   [facility] [place], [place] - [phone] [address] Phone: [phone] Fax: [phone]     Social Determinants of Health (SDOH) Interventions    Readmission Risk Interventions No flowsheet data found.

## 2021-01-25 NOTE — Social Work (Signed)
CSW spoke with pt and pt sister about consideration for SNF, pt and pt sister would like for pt to go home. Pt sister states they will go home and she will care for him as she usually does. CSW signing off.

## 2021-01-27 ENCOUNTER — Other Ambulatory Visit: Payer: Self-pay

## 2021-01-27 ENCOUNTER — Ambulatory Visit (INDEPENDENT_AMBULATORY_CARE_PROVIDER_SITE_OTHER): Payer: Medicare Other | Admitting: Family Medicine

## 2021-01-27 VITALS — BP 121/82 | HR 70

## 2021-01-27 DIAGNOSIS — Z7189 Other specified counseling: Secondary | ICD-10-CM | POA: Diagnosis not present

## 2021-01-27 DIAGNOSIS — I10 Essential (primary) hypertension: Secondary | ICD-10-CM

## 2021-01-27 NOTE — Progress Notes (Signed)
    SUBJECTIVE:   CHIEF COMPLAINT / HPI:   Hospital follow-up  Aspiration concerns [name] reports that the patient is continue to have issues with aspiration.  She has been trying to feed him based off of the nutritionist recommendations from the hospital reports that even this morning she was feeding him he started coughing.  He reported that he could not breathe and continued to cough.  Reports that his shortness of breath has improved but that he will intermittently have episodes of rapid breathing and then episodes of normal breathing.  Restart losartan? [name] reports that she has restarted the patient on all of his home medications.  She reports that he has been tolerating the losartan well.  He has also been taking potassium supplement 10 mEq daily.  Most recent blood work was 2 days ago and showed potassium of 4.4.   Crestor initiated Patient has started on Crestor but given goals of care discussion consider discontinuation.   Goals of care  Discussed goals of care with the patient today.  The sister reports that they now have a DNR.  She reports that palliative care has been consulted in the past but that she never received a call from palliative care.  Per chart review it appears that palliative care attempted to call the patient but was unable to get in touch with.  Patient reports he wants to ensure his comfort.  We will continue antibiotics and other treatments at this time but is open to comfort/palliative care and would like to speak with the palliative care team.  OBJECTIVE:   BP 121/82   Pulse 70   SpO2 100%   General: Chronically ill-appearing 81 year old male Cardiac: Regular rate and rhythm Respiratory: Intermittent episodes of tachypnea with considerable amount of congestion, rhonchi, rales in all lung fields, no crackles appreciated at lung bases clinically decreased air movement in lower lung fields bilaterally Abdomen: Soft, nontender, positive  bowel sounds MSK: Right AKA  ASSESSMENT/PLAN:   Goals of care, counseling/discussion Patient has been doing "okay" since discharge.  Continues to have issues with aspiration during meals.  Discussed slow feeds with small bites, thickened foods.  Also had a long goals of care discussion with the patient and his sister.  They are interested in meeting with palliative care.  They want to continue with the DNR status but will continue antibiotics and other treatments for his illnesses.  Referral placed for palliative care and the sister's phone number was placed in the referral.  Discussed return precautions and ED precautions if they wish.  No further questions or concerns.  Patient has been taking potassium and losartan.  Repeat BMP ordered as future order and patient sister will bring the patient for the lab draw on Monday.   Gifford Shave, MD [place]

## 2021-01-27 NOTE — Assessment & Plan Note (Signed)
Patient has been doing "okay" since discharge.  Continues to have issues with aspiration during meals.  Discussed slow feeds with small bites, thickened foods.  Also had a long goals of care discussion with the patient and his sister.  They are interested in meeting with palliative care.  They want to continue with the DNR status but will continue antibiotics and other treatments for his illnesses.  Referral placed for palliative care and the sister's phone number was placed in the referral.  Discussed return precautions and ED precautions if they wish.  No further questions or concerns.

## 2021-01-27 NOTE — Patient Instructions (Signed)
It was great seeing you today.  Concerned that Sion is still aspirating.  I want you to keep an eye on his breathing as well as his temperatures.  If you decide that you do want to continue hospital care if he gets sick again please go be evaluated in the emergency department.  I have placed a referral for palliative care and someone should be calling you to discuss this further.  If you need anything from [place] please call the clinic.  I would like for you to bring him back on Monday for some lab work.  I hope you have a wonderful day!

## 2021-02-02 ENCOUNTER — Other Ambulatory Visit: Payer: Self-pay

## 2021-02-02 ENCOUNTER — Ambulatory Visit (INDEPENDENT_AMBULATORY_CARE_PROVIDER_SITE_OTHER): Payer: Medicare Other | Admitting: Pharmacist

## 2021-02-02 ENCOUNTER — Ambulatory Visit (HOSPITAL_COMMUNITY)
Admission: RE | Admit: 2021-02-02 | Discharge: 2021-02-02 | Disposition: A | Discharge status: Home / Self Care | Payer: Medicare Other | Source: Ambulatory Visit | Attending: Family Medicine | Admitting: Family Medicine

## 2021-02-02 ENCOUNTER — Encounter: Payer: Self-pay | Admitting: Pharmacist

## 2021-02-02 VITALS — BP 110/60 | HR 45

## 2021-02-02 DIAGNOSIS — I5022 Chronic systolic (congestive) heart failure: Secondary | ICD-10-CM | POA: Diagnosis not present

## 2021-02-02 DIAGNOSIS — R001 Bradycardia, unspecified: Secondary | ICD-10-CM | POA: Insufficient documentation

## 2021-02-02 NOTE — Assessment & Plan Note (Signed)
Patient bradycardic with HR 45 during visit. EKG was ordered by Dr. Owens Shark, with noticed changes.  -Discontinued metoprolol succinate due to bradycardia  -BMET and magnesium lab ordered

## 2021-02-02 NOTE — Patient Instructions (Signed)
It was nice seeing you today!  Your heart rate was low today so please STOP metoprolol.   We are checking some lab work today.   Next appointment scheduled Wednesday 11/23 at 9:30am.

## 2021-02-02 NOTE — Progress Notes (Signed)
  S:    Patient arrives in a pleasant mood in a wheelchair with his sister, Mardene Celeste. Presents to the clinic for medication review.  Patient was referred for a PFT due to smoking history but this was not performed due to patient's recent pneumonia and aspiration.  Patient was last seen by Primary Care Provider Dr. Edwina Barth on 12/13/2020.   Medication adherence reported good. [name] manages his medications.   Current BP Medications include:  metoprolol succinate 25 mg 0.5 tablet (12.5 mg) daily   O:  Physical Exam Musculoskeletal:     Right lower leg: No edema.     Left lower leg: No edema.  Neurological:     Mental Status: He is alert.    Review of Systems  Respiratory:  Positive for cough.   All other systems reviewed and are negative.   Last 3 Office BP readings: BP Readings from Last 3 Encounters:  02/02/21 110/60  01/27/21 121/82  01/25/21 130/65    BMET    Component Value Date/Time   NA 142 01/25/2021 0701   NA 144 12/13/2020 1033   K 4.4 01/25/2021 0701   CL 111 01/25/2021 0701   CO2 22 01/25/2021 0701   GLUCOSE 95 01/25/2021 0701   BUN 11 01/25/2021 0701   BUN 13 12/13/2020 1033   CREATININE 0.70 01/25/2021 0701   CREATININE 1.21 (H) 10/12/2014 1221   CALCIUM 9.2 01/25/2021 0701   GFRNONAA >60 01/25/2021 0701   GFRAA 96 09/29/2019 1051    Renal function: Estimated Creatinine Clearance: 57.2 mL/min (by C-G formula based on SCr of 0.7 mg/dL).  Clinical ASCVD: Yes  The 10-year ASCVD risk score (Arnett DK, et al., 2019) is: 19.1%   Values used to calculate the score:     Age: 73 years     Sex: Male     Is Non-Hispanic African American: Yes     Diabetic: No     Tobacco smoker: No     Systolic Blood Pressure: 756 mmHg     Is BP treated: Yes     HDL Cholesterol: 43 mg/dL     Total Cholesterol: 169 mg/dL   A/P: Patient bradycardic with HR 45 during visit. EKG was ordered by Dr. Owens Shark, with noticed changes.  -Discontinued metoprolol succinate  due to bradycardia  -BMET and magnesium lab ordered    Results reviewed and written information provided.   Total time in face-to-face counseling 40 minutes.   F/U Clinic Visit on 02/08/2021.  Patient seen with Elyse Jarvis, PharmD Candidate, and Rebbeca Paul, PharmD - PGY2 Pharmacy Resident.

## 2021-02-02 NOTE — Progress Notes (Signed)
Reviewed: I agree with Dr. Koval's documentation and management. 

## 2021-02-02 NOTE — Progress Notes (Signed)
Patient seen and examined. Reports he feels well. Frequent PVC on exam. Coarse breath sounds, no tachypnea. EKG shows frequent PVCs, will obtain electrolytes. Close follow up scheduled to ensure clear goals of care--would benefit from CCM and close palliative care follow up (referred by PCP at last visit).  Dorris Singh, MD  Family Medicine Teaching Service

## 2021-02-03 LAB — BASIC METABOLIC PANEL
BUN/Creatinine Ratio: 9 — ABNORMAL LOW (ref 10–24)
BUN: 6 mg/dL — ABNORMAL LOW (ref 8–27)
CO2: 21 mmol/L (ref 20–29)
Calcium: 9.9 mg/dL (ref 8.6–10.2)
Chloride: 104 mmol/L (ref 96–106)
Creatinine, Ser: 0.65 mg/dL — ABNORMAL LOW (ref 0.76–1.27)
Glucose: 91 mg/dL (ref 70–99)
Potassium: 4.5 mmol/L (ref 3.5–5.2)
Sodium: 139 mmol/L (ref 134–144)
eGFR: 96 mL/min/{1.73_m2} (ref >59)

## 2021-02-03 LAB — MAGNESIUM: Magnesium: 1.9 mg/dL (ref 1.6–2.3)

## 2021-02-05 ENCOUNTER — Telehealth: Payer: Self-pay | Admitting: Family Medicine

## 2021-02-05 NOTE — Telephone Encounter (Signed)
Called patient's sister (Ms. Jerline Pain) with results. Continue KCL as taking lasix. He is no longer taking metoprolol. Follow up this week to assess breathing, continue to address goals of care/nutritional status.  Dorris Singh, MD  Family Medicine Teaching Service

## 2021-02-07 NOTE — Progress Notes (Signed)
    SUBJECTIVE:   CHIEF COMPLAINT / HPI:   Follow-up-bradycardia: 80 year old male presenting for follow-up with bradycardia noted at last visit on 11/7 today with heart rate at 45.  Metoprolol succinate was discontinued due to bradycardia and labs were ordered as well as an EKG with frequent PVCs.  Recommended ongoing discussion with goals of care.  Labs do not show an obvious electrolyte abnormality.  Today he states denies chest pain, dizziness, light headedness or shortness of breath.  History of CP: He has no specific complaints or concerns today.  He states that at the home he does not have any nurse or other assistance coming to the home to help since his last hospitalization. They are interested in that. He lives with his sister and her husband who provide his care.   PERTINENT  PMH / PSH: Cerebral palsy  OBJECTIVE:   BP (!) 131/91   Pulse (!) 45   SpO2 96%    General: Chronically ill-appearing male seated in wheelchair with right arm contracture noted Cardiac: Heart rate approximately 74, frequent abnormal beats noted. No murmurs.  EKG last week with PVCs approximately every 1-3 beats. Respiratory: CTAB, normal effort Abdomen: Bowel sounds present, nontender, nondistended, no hepatosplenomegaly. Psych: Normal affect and mood  ASSESSMENT/PLAN:   Spastic quadriplegic cerebral palsy (HCC) His sister who is his primary caregiver states that they previously did have someone coming into the house but I have not had this since his last hospitalization.  She is interested in discussing personal care services versus other means for someone to come and provide some extra care assistance while in the household.  They are also waiting to hear from palliative care.  I reached out to our referral specialist who is following up on the palliative care consult.  I also placed a consult for CCM for our social worker to reach out to the patient.  They are aware of this  consult.  Bradycardia Heart rate documented at 45, on physical exam he has frequent abnormal beats with a EKG performed last week showing a heart rate of 80 with a PVC approximately every 2-3 beats throughout the read.  Physical exam is consistent with this today, heart rate approximately 74.  My expectoration is are pulse ox is not picking up the PVCs.  He is not have any symptoms due to this with no shortness of breath, chest pain, dizziness or lightheadedness.  He has no complaints today.  We will continue to monitor.  He is not on any medications that can lower his heart rate.  Discussed return precautions if he starts experiencing any of the above symptoms.    Lurline Del, [place]

## 2021-02-08 ENCOUNTER — Ambulatory Visit (INDEPENDENT_AMBULATORY_CARE_PROVIDER_SITE_OTHER): Payer: Medicare Other | Admitting: Family Medicine

## 2021-02-08 ENCOUNTER — Other Ambulatory Visit: Payer: Self-pay

## 2021-02-08 VITALS — BP 131/91 | HR 45

## 2021-02-08 DIAGNOSIS — R001 Bradycardia, unspecified: Secondary | ICD-10-CM | POA: Diagnosis not present

## 2021-02-08 DIAGNOSIS — G809 Cerebral palsy, unspecified: Secondary | ICD-10-CM | POA: Diagnosis not present

## 2021-02-08 DIAGNOSIS — G800 Spastic quadriplegic cerebral palsy: Secondary | ICD-10-CM

## 2021-02-08 HISTORY — DX: Bradycardia, unspecified: R00.1

## 2021-02-08 NOTE — Assessment & Plan Note (Addendum)
Heart rate documented at 45, on physical exam he has frequent abnormal beats with a EKG performed last week showing a heart rate of 80 with a PVC approximately every 2-3 beats throughout the read.  Physical exam is consistent with this today, heart rate approximately 74.  My expectoration is are pulse ox is not picking up the PVCs.  He is not have any symptoms due to this with no shortness of breath, chest pain, dizziness or lightheadedness.  He has no complaints today.  We will continue to monitor.  He is not on any medications that can lower his heart rate.  Discussed return precautions if he starts experiencing any of the above symptoms.

## 2021-02-08 NOTE — Assessment & Plan Note (Signed)
His sister who is his primary caregiver states that they previously did have someone coming into the house but I have not had this since his last hospitalization.  She is interested in discussing personal care services versus other means for someone to come and provide some extra care assistance while in the household.  They are also waiting to hear from palliative care.  I reached out to our referral specialist who is following up on the palliative care consult.  I also placed a consult for CCM for our social worker to reach out to the patient.  They are aware of this consult.

## 2021-02-08 NOTE — Patient Instructions (Signed)
I have placed an order for our comprehensive care services and our social worker Casimer Lanius may be reaching out to you this week versus early next week.  We have also reached out to the referral specialist to make sure the palliative care consult is set up.  If you do not hear from either of these individuals in the next week please let me know.  If you start have any dizziness, chest pain, lightheadedness, shortness of breath please let us know.  Your heart rate is documented as being slow but I think this is due to the frequent abnormal beats that you have as we discussed today.  We do not need to do anything additionally for this unless you start having symptoms.

## 2021-02-13 ENCOUNTER — Telehealth: Payer: Self-pay | Admitting: *Deleted

## 2021-02-13 NOTE — Chronic Care Management (AMB) (Signed)
  Care Management   Note  02/13/2021 Name: Rick Schwartz MRN: [id number] DOB: 1940-09-26  ALTON BOUKNIGHT is a 80 y.o. year old male who is a primary care patient of Lurline Del, DO. I reached out to Sueanne Margarita by phone today in response to a referral sent by Mr. Carman Ching Matteo's primary care provider.   Mr. Swift was given information about care management services today including:  Care management services include personalized support from designated clinical staff supervised by his physician, including individualized plan of care and coordination with other care providers 24/7 contact phone numbers for assistance for urgent and routine care needs. The patient may stop care management services at any time by phone call to the office staff.  Patient agreed to services and verbal consent obtained.   Follow up plan: Telephone appointment with care management team member scheduled for:02/15/21   [place]: [phone]

## 2021-02-15 ENCOUNTER — Ambulatory Visit: Payer: Medicare Other | Admitting: Licensed Clinical Social Worker

## 2021-02-15 DIAGNOSIS — G809 Cerebral palsy, unspecified: Secondary | ICD-10-CM

## 2021-02-15 DIAGNOSIS — F0390 Unspecified dementia without behavioral disturbance: Secondary | ICD-10-CM

## 2021-02-15 DIAGNOSIS — Z7189 Other specified counseling: Secondary | ICD-10-CM

## 2021-02-15 NOTE — Chronic Care Management (AMB) (Signed)
    Clinical Social Work  Care Management   Phone Outreach    02/15/2021 Name: Rick Schwartz MRN: [id number] DOB: Jul 04, 1940  Rick Schwartz is a 80 y.o. year old male who is a primary care patient of Lurline Del, DO .   Reason for referral: Level of Care Concerns.    CCM LCSW reached out to patient's sister today by phone to introduce self, assess needs and offer Care Management services and interventions.    She was unable to take call at the scheduled time and requested a call back in 20 min.  Plan: Will call back  Review of patient status, including review of consultants reports, relevant laboratory and other test results, and collaboration with appropriate care team members and the patient's provider was performed as part of comprehensive patient evaluation and provision of care management services.     Casimer Lanius, [place] / [place]   [phone]

## 2021-02-15 NOTE — Patient Instructions (Signed)
Visit Information  Thank you for taking time to visit with me today. Please don't hesitate to contact me if I can be of assistance to you before our next scheduled telephone appointment.  Following are the goals we discussed today: providing additional home support  Our next appointment is by telephone on 03/06/21 at 10:45  Please call the care guide team at [phone] if you need to cancel or reschedule your appointment.   If you are experiencing a Mental Health or [place] or need someone to talk to, please call the Suicide and Crisis Lifeline: 988 call the [place] National Suicide Prevention Lifeline: 937-401-1197 or TTY: 8485160784 TTY 714-576-0690) to talk to a trained counselor call 1-800-273-TALK (toll free, 24 hour hotline) go to [facility] [address], [place] [phone]) call 911   Following is a copy of your full plan of care:  Care Plan : General Social Work (Adult)  Updates made by Maurine Cane, LCSW since 02/15/2021 12:00 AM     Problem: Needs assistance with activies of daily living      Long-Range Goal: Caregiver Coping Optimized with additional support in the home   Start Date: 02/15/2021  Expected End Date: 05/16/2021  This Visit's Progress: On track  Priority: High  Note:   Current Barriers:  Care Coordination needs related to Dementia: Dementia with other behavioral disturbances Level of Care Concerns:Inability to perform IADL's independently and Inability to perform ADL's independently  CSW Clinical Goal(s):  Caregiver  will  work with agencies discussed  through collaboration with [place] representative, provider, and care team.   Interventions: Inter-disciplinary care team collaboration (see longitudinal plan of care) Evaluation of current treatment plan related to  self management and patient's adherence to plan as established by provider  Level of Care Concerns in a patient with  Dementia, Pulmonary Disease, and Cerebral palsy :  (Status: New goal.) Current level of care: home with other family or significant other(s): family member: sister Evaluation of patient safety in current living environment Caregiver stress acknowledged  [place] of Attorney  Made referral to [facility] for PCS and [place] for [place] with [facility] for CAP Collaborated with [facility] staff and PCP for [facility] referral Faxed completed PCS form on 02/15/21 E-mail PCS educational information and list Discussed PACE program ( declined)  Patient Goals/Self-Care Activities: Review the [facility] list e-mail and select a provider The referral was faxed today 02/15/21 [facility] will contact you to schedule your assessment Contact [place] if you have questions [phone] or [phone]     Rick Schwartz was given information about Care Management services by the embedded care coordination team including:  Care Management services include personalized support from designated clinical staff supervised by his physician, including individualized plan of care and coordination with other care providers 24/7 contact phone numbers for assistance for urgent and routine care needs. The patient may stop CCM services at any time (effective at the end of the month) by phone call to the office staff.  Patient agreed to services and verbal consent obtained.   Patient verbalizes understanding of instructions provided today and agrees to view in [place].   Please call the office if needed  Casimer Lanius, [place] Management & Coordination  [phone]

## 2021-02-15 NOTE — Chronic Care Management (AMB) (Signed)
Care Management  Clinical Social Work Note  02/15/2021 Name: Rick Schwartz MRN: [id number] DOB: October 31, 1940  Rick Schwartz is a 80 y.o. year old male who is a primary care patient of Lurline Del, DO. The CCM team was consulted for assistance with care coordination needs: Level of Care Concerns for CAP and PCS.  [name] was given information about Care Management services today including:  Care Management services include personalized support from designated clinical staff supervised by his physician, including individualized plan of care and coordination with other care providers 24/7 contact phone numbers for assistance for urgent and routine care needs. The patient may stop care management services at any time (effective at the end of the month) by phone call to the office staff.  Patient agreed to services and consent obtained.   Engaged with patient by telephone for initial visit in response to provider referral for social care coordination services.  [name] accompanied him and provided most of the information.   Assessment: Review of patient past medical history, allergies, medications, and health status, including review of pertinent consultant reports was performed as part of comprehensive evaluation and provision of care management/care coordination services See Care Plan below for interventions and patient self-care actives.  [name]  SDOH (Social Determinants of Health) screening and interventions performed today:   Advanced Directives Status: Per sister patient has documents, they will bring a copy to the office.  Sister is POA.      Care Plan    Conditions to be addressed/monitored per PCP order: Dementia and Cerebral Palsy , Level of care concerns  Care Plan : General Social Work (Adult)  Updates made by Maurine Cane, LCSW since 02/15/2021 12:00 AM     Problem: Needs assistance with activies of daily living      Long-Range Goal: Caregiver Coping Optimized  with additional support in the home   Start Date: 02/15/2021  Expected End Date: 05/16/2021  This Visit's Progress: On track  Priority: High  Note:   Current Barriers:  Care Coordination needs related to Dementia: Dementia with other behavioral disturbances Level of Care Concerns:Inability to perform IADL's independently and Inability to perform ADL's independently  CSW Clinical Goal(s):  Caregiver  will  work with agencies discussed  through collaboration with [place] representative, provider, and care team.   Interventions: Inter-disciplinary care team collaboration (see longitudinal plan of care) Evaluation of current treatment plan related to  self management and patient's adherence to plan as established by provider  Level of Care Concerns in a patient with Dementia, Pulmonary Disease, and Cerebral palsy :  (Status: New goal.) Current level of care: home with other family or significant other(s): family member: sister Evaluation of patient safety in current living environment Caregiver stress acknowledged  [place] of Attorney  Made referral to [facility] for PCS and [place] for [place] with [facility] for CAP Collaborated with [facility] staff and PCP for [facility] referral Faxed completed PCS form on 02/15/21 E-mail PCS educational information and list Discussed PACE program ( declined)  Patient Goals/Self-Care Activities: Review the [facility] list e-mail and select a provider The referral was faxed today 02/15/21 [facility] will contact you to schedule your assessment Contact [facility] if you have questions [phone] or [phone]    Follow up Plan:  Patient would like continued follow-up from CCM LCSW.  per patient's request will follow up in 3 weeks.  Will call office if needed prior to  next encounter.   Rick Schwartz, [place] / [place]    [phone]

## 2021-02-21 ENCOUNTER — Ambulatory Visit: Payer: Self-pay | Admitting: Licensed Clinical Social Worker

## 2021-02-21 DIAGNOSIS — Z741 Need for assistance with personal care: Secondary | ICD-10-CM

## 2021-02-21 DIAGNOSIS — F0390 Unspecified dementia without behavioral disturbance: Secondary | ICD-10-CM

## 2021-02-21 DIAGNOSIS — G809 Cerebral palsy, unspecified: Secondary | ICD-10-CM

## 2021-02-21 NOTE — Patient Instructions (Signed)
   Patient was not contacted during this encounter.  LCSW collaborated with care team to accomplish patient's care plan goal   Hayde Kilgour, LCSW Care Management & Coordination  [phone]  

## 2021-02-21 NOTE — Chronic Care Management (AMB) (Signed)
  Care Management  Collaboration  Note  02/21/2021 Name: Rick Schwartz MRN: [id number] DOB: 1940/11/11  Rick Schwartz is a 80 y.o. year old male who is a primary care patient of Lurline Del, DO. The CCM team was consulted reference care coordination needs for Level of Care Concerns.for PCS  Assessment: Patient was not interviewed or contacted during this encounter. Called sister and left voice message. See Care Plan or interventions for patient self-care actives.   Intervention:Conducted brief assessment, recommendations and relevant information discussed.  CCM LCSW collaborated with [facility] to assist with meeting patient's needs.    Follow up Plan: Patient's caregiver would like continued follow-up from CCM LCSW .  per caregiver's request CCM LCSW will follow up in 2 weeks.  They will call the office if needed prior to next encounter.   Review of patient past medical history, allergies, medications, and health status, including review of pertinent consultant reports was performed as part of comprehensive evaluation and provision of care management/care coordination services.   Care Plan Conditions to be addressed/monitored per PCP order: Dementia, Level of care concerns   Care Plan : General Social Work (Adult)  Updates made by Rick Cane, LCSW since 02/21/2021 12:00 AM     Problem: Needs assistance with activies of daily living      Long-Range Goal: Caregiver Coping Optimized with additional support in the home   Start Date: 02/15/2021  Expected End Date: 05/16/2021  This Visit's Progress: On track  Recent Progress: On track  Priority: High  Note:   Current Barriers:  Care Coordination needs related to Dementia: Dementia with other behavioral disturbances Level of Care Concerns:Inability to perform IADL's independently and Inability to perform ADL's independently  CSW Clinical Goal(s):  Caregiver  will  work with agencies discussed  through collaboration with  [place] representative, provider, and care team.   Interventions: Inter-disciplinary care team collaboration (see longitudinal plan of care) Evaluation of current treatment plan related to  self management and patient's adherence to plan as established by provider  Level of Care Concerns in a patient with Dementia, Pulmonary Disease, and Cerebral palsy :  (Status: Goal on Track (progressing): YES.) Confirmed with [facility] care information received and ready to schedule assessment.  Called 12/06 and left message for patient's sister to Call [facility] to get assessment scheduled.  Current level of care: home with other family or significant other(s): family member: sister Evaluation of patient safety in current living environment Caregiver stress acknowledged  [place] of Attorney  Made referral to [facility] for PCS and [place] for [place] with [facility] health care  Patient Goals/Self-Care Activities: Review the [facility] list e-mail and select a provider The referral was faxed today 02/15/21 [facility] will contact you to schedule your assessment Contact [facility] if you have questions [phone] or [place], [place] / [place]   [phone]

## 2021-03-01 ENCOUNTER — Telehealth: Payer: Self-pay | Admitting: Family Medicine

## 2021-03-01 NOTE — Telephone Encounter (Addendum)
Opened In error

## 2021-03-02 ENCOUNTER — Telehealth: Payer: Self-pay | Admitting: Family Medicine

## 2021-03-02 NOTE — Telephone Encounter (Signed)
Clinical info completed on CAP form.  Place form in PCP's box for completion.  Salvatore Marvel, CMA

## 2021-03-02 NOTE — Telephone Encounter (Signed)
CAP form dropped off form at front desk for Dr. Vanessa Humboldt.  Verified that patient section of form has been completed.  Last DOS/WCC with PCP was 02/08/21.  Placed form in [facility] team folder to be completed by clinical staff.  Creig Hines

## 2021-03-02 NOTE — Telephone Encounter (Addendum)
Open in error

## 2021-03-06 ENCOUNTER — Ambulatory Visit: Payer: Medicare Other | Admitting: Licensed Clinical Social Worker

## 2021-03-06 DIAGNOSIS — Z741 Need for assistance with personal care: Secondary | ICD-10-CM

## 2021-03-06 DIAGNOSIS — F0390 Unspecified dementia without behavioral disturbance: Secondary | ICD-10-CM

## 2021-03-06 DIAGNOSIS — G809 Cerebral palsy, unspecified: Secondary | ICD-10-CM

## 2021-03-06 NOTE — Patient Instructions (Signed)
Visit Information  Thank you for taking time to visit with me today. Please don't hesitate to contact me if I can be of assistance to you before our next scheduled telephone appointment.  Following are the goals we discussed today: Lever of Care with [id number] and CAPS Patient Self-Care Activities: Contact [facility] if you have questions [phone] or [phone] Mail CAPS packet back to [place] once completed by Dr. Edwina Barth  Please call the care guide team at [phone] if you need to cancel or reschedule your appointment.   If you are experiencing a Mental Health or [place] or need someone to talk to, please call 1-800-273-TALK (toll free, 24 hour hotline) call 911    Please call the office if needed No follow up scheduled, per our conversation you do not require continued follow up I will disconnect from your care team at this time, please call the office if additional needs are identified  Patient's caregiver verbalizes understanding of instructions provided today and agrees to view in [place].  Casimer Lanius, LCSW Care Management & Coordination  [phone]

## 2021-03-06 NOTE — Chronic Care Management (AMB) (Signed)
Care Management  Clinical Social Work Note  03/06/2021 Name: Rick Schwartz MRN: [id number] DOB: Dec 09, 1940  Rick Schwartz is a 80 y.o. year old male who is a primary care patient of Lurline Del, DO. The CCM team was consulted for assistance with care coordination needs. Level of Care Concerns   Consent to Services:  The patient was given information about Care Management services, agreed to services, and gave verbal consent prior to initiation of services.  Please see initial visit note for detailed documentation.   Patient agreed to services today and consent obtained.  Engaged with patient's sister  for follow up visit in response to provider referral for social work care coordination services. .   Assessment includes: Review of patient past medical history, allergies, medications, and health status, including review of pertinent consultant reports was performed as part of comprehensive evaluation and provision of care management/care coordination services. See Care Plan below for interventions and patient self-care actives.   SDOH (Social Determinants of Health) screening and interventions performed today:   Advanced Directives Status:Not addressed in this encounter.     Care Plan    Conditions to be addressed/monitored per PCP order: , Level of care concerns  Care Plan : General Social Work (Adult)  Updates made by Maurine Cane, LCSW since 03/06/2021 12:00 AM     Problem: Needs assistance with activies of daily living      Long-Range Goal: Caregiver Coping Optimized with additional support in the home   Start Date: 02/15/2021  Expected End Date: 05/16/2021  This Visit's Progress: On track  Recent Progress: On track  Priority: High  Note:   Current Barriers:  Care Coordination needs related to Dementia: Dementia with other behavioral disturbances Level of Care Concerns:Inability to perform IADL's independently and Inability to perform ADL's independently  CSW  Clinical Goal(s):  Caregiver  will  work with agencies discussed  through collaboration with [place] representative, provider, and care team.   Interventions: Inter-disciplinary care team collaboration (see longitudinal plan of care) Evaluation of current treatment plan related to  self management and patient's adherence to plan as established by provider  Level of Care Concerns in a patient with Dementia, Pulmonary Disease, and Cerebral palsy :  (Status: Goal on Track (progressing): YES. No Needs Identified this visit) Assessment  with [place] care scheduled today 12/19 at 12:30  Current level of care: home with other family or significant other(s): family member: sister Evaluation of patient safety in current living environment Caregiver stress acknowledged  [place] of Attorney  Made referral to [facility] for [facility] and [place] for CAPS program CAPS packet received: patient's part completed and sister took other part to office for PCP to complete Reviewed instructions and next steps for sister Patient Goals/Self-Care Activities: Contact [facility] if you have questions [phone] or [phone] Mail CAPS packet back to [place] once completed by Dr. Edwina Barth     Follow up Plan:  Patient has connected with all services and does not require continued follow-up by CCM LCSW. Will contact the office if needed CCM LCSW will disconnect from patient's care team at this time, but will be available at any time they would like to re-engage for care coordination services.    Rick Schwartz, [place] / [place]   [phone]

## 2021-03-16 ENCOUNTER — Other Ambulatory Visit: Payer: Self-pay

## 2021-03-16 ENCOUNTER — Other Ambulatory Visit: Payer: Medicare Other | Admitting: Hospice

## 2021-03-16 DIAGNOSIS — E43 Unspecified severe protein-calorie malnutrition: Secondary | ICD-10-CM

## 2021-03-16 DIAGNOSIS — R1310 Dysphagia, unspecified: Secondary | ICD-10-CM

## 2021-03-16 DIAGNOSIS — I5022 Chronic systolic (congestive) heart failure: Secondary | ICD-10-CM

## 2021-03-16 DIAGNOSIS — Z515 Encounter for palliative care: Secondary | ICD-10-CM

## 2021-03-16 NOTE — Progress Notes (Signed)
Rick Schwartz Consult Note Telephone: [phone]  Fax: [phone]  PATIENT NAME: [name] [id number] [phone] (home)  DOB: 1940-04-08 MRN: [id number]  PRIMARY CARE PROVIDER:    Lurline Del, DO,  [place] [address] [phone]  REFERRING PROVIDER:   Lurline Del, [facility] [place],  [place] [address] [phone]  RESPONSIBLE PARTY:   Maura Crandall Information     Name Relation Home Work Mobile   [address] [address] [place] Sister [phone]     Kamonte, Mcmichen [phone]     Jamey Reas   [phone]   Alexandr, [place] Niece   [phone]       TELEHEALTH VISIT STATEMENT Due to the COVID-19 crisis, this visit was done via telemedicine from my office and it was initiated and consent by this patient and or family. Video-audio (telehealth) contact was unable to be done due to technical barriers from the patients side. I connected with patient OR PROXY by a telephone  and verified that I am speaking with the correct person. I discussed the limitations of evaluation and management by telemedicine. The patient expressed understanding and agreed to proceed.  Palliative Care was asked to follow this patient by consultation request of  Lurline Del, DO to address advance care planning, complex medical decision making and goals of care clarification. Mardene Celeste was home with patient during visit. This is the follow up visit.    ASSESSMENT AND / RECOMMENDATIONS:   CODE STATUS:Patient is a Do Not Resuscitate. NP signed DNR form for patient to keep at home.   Goals of Care: Goals include to maximize quality of life and symptom management  Symptom Management/Plan: Dysphagia: Continue with mechanical soft diet, choking precautions discussed. ST consult is recommended.  Patient is R AKA, non ambulatory, 2 persons max assist for transfers. CHF: Echo in July  2022 showed less than 20% EF.  Continue Lasix as ordered. Education provided on reduced salt intake, elevation of extremities. Severe protein caloric malnutrition: Sister reports patient has good oral intake and gained about 7 Ibs from last visit 4 months ago. . Offer 4-6 small meals a day, provide assistance as needed to ensure adequate oral intake. Take Ensure BID. ST consult as needed.  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  Follow up: Palliative care will continue to follow for complex medical decision making, advance care planning, and clarification of goals. Return 6 weeks or prn.Encouraged to call provider sooner with any concerns.   Family /Caregiver/Community Supports: Patient lives at home with his sister/family. Sister is involved in patient's care. Strong family support system identified.   HOSPICE ELIGIBILITY/DIAGNOSIS: TBD  Chief Complaint: Follow up visit  HISTORY OF PRESENT ILLNESS:  Rick Schwartz is a 80 y.o. year old male  with multiple medical conditions dysphagia, now able to eat only mechanical soft diet; tolerates thin liquid well. Patient had aspiration pneumonia for which he was hospitalized in November 2022 and treated.  History of Cerebral palsy, HTN, foot ulcer, severe protein calorie malnutrition, DVT, prostate cancer, PAD, Right AKA HLD, PE, and severe HFrEF.  Today he denies pain/discomfort, no respiratory distress.  Previous hospitalization: Hospitalization 7/5 - 09/27/2020 for shortness of breath/acute hypoxia. Epic records indicates patient was  Found to have Pulmonary embolism; also found to have Bilateral Pleural Effusion on Chest X-Ray.  Thoracentesis performed with 900 mL of Transudative fluid. He was further treated with heparin drip and transitioned to Eliquis. History obtained  from review of EMR, discussion with primary team, caregiver, family and/or Mr. Grandville Silos.  Review and summarization of Epic records shows history from other than patient.  Rest of 10 point ROS asked and negative.   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   Cough 01/21/2012   Prediabetes 07/20/2016   Physical deconditioning 05/06/2020   Focal motor deficit 07/07/2020   Gangrene of right foot ([place]) 07/17/2020   Foot ulcer with necrosis of muscle, right ([place]) 07/18/2020   Sepsis ([place]) 07/18/2020   Lactic acidosis 07/18/2020   Hypernatremia 07/18/2020   Hypercalcemia 07/18/2020   Protein-calorie malnutrition ([place])    SBO (small bowel obstruction) (H[facility])    Inanition ([place])    Right pulmonary embolus ([place]) 09/21/2020   Shortness of breath    Pleural effusion, bilateral    Pulmonary embolus ([place]) 09/21/2020   Pressure injury of skin 09/22/2020   Protein-calorie malnutrition, severe 09/22/2020   Dementia without behavioral disturbance ([place])    Chronic HFrEF (heart failure with reduced ejection fraction) ([place])    COVID-19 11/22/2020   Acute respiratory failure with hypoxemia ([place])    Spastic quadriplegic cerebral palsy ([place])    Aspiration pneumonia ([place]) 01/20/2021   Community acquired pneumonia    Pneumonia 01/20/2021   Goals of care, counseling/discussion 01/27/2021   Bradycardia 02/08/2021   Resolved Ambulatory Problems    Diagnosis Date Noted   LIPOMA, FACE 05/02/2007   HYPOKALEMIA 05/16/2006   PEDAL EDEMA 01/02/2007   CERUMEN IMPACTION, BILATERAL 04/11/2010   Laceration of skin of face 09/12/2010   Healthcare maintenance 03/09/2011   Abnormal colonoscopy 12/27/2013   Gait instability 01/25/2014   Toe ulcer, right ([place]) 04/26/2015   Leg edema 10/10/2018   Past Medical History:  Diagnosis Date   Cancer ([place])    Cerebral palsy ([place])    Hyperlipidemia    Hypertension     Peripheral arterial disease ([place])     SOCIAL HX:  Social History   Tobacco Use   Smoking status: Former    Packs/day: 0.25    Years: 35.00    Pack years: 8.75    Types: Cigarettes    Quit date: 10/12/1994    Years since quitting: 26.4   Smokeless tobacco: Never  Substance Use Topics   Alcohol use: Yes    Comment: Occasional beer and gin     FAMILY HX:  Family History  Problem Relation Age of Onset   Diabetes Mother    Hypertension Mother    Hypertension Father    Diabetes Sister    Hypertension Sister    Diabetes Brother    Hypertension Brother    Diabetes Sister    Thyroid disease Sister    Sudden death Brother       ALLERGIES: No Known Allergies    PERTINENT MEDICATIONS:  Outpatient Encounter Medications as of 03/16/2021  Medication Sig   acetaminophen (TYLENOL) 325 MG tablet Take 2 tablets (650 mg total) by mouth every 6 (six) hours. (Patient taking differently: Take 650 mg by mouth every 6 (six) hours as needed for mild pain, headache or fever.)   apixaban (ELIQUIS) 5 MG TABS tablet Take 1 tablet (5 mg total) by mouth 2 (two) times daily.   furosemide (LASIX) 20 MG tablet Take  1 tablet (20 mg total) by mouth daily.   guaiFENesin (ROBITUSSIN) 100 MG/5ML liquid Take 100 mg by mouth daily as needed for cough or to loosen phlegm.   potassium chloride (MICRO-K) 10 MEQ CR capsule Take 1 capsule (10 mEq total) by mouth daily.   rosuvastatin (CRESTOR) 20 MG tablet Take 1 tablet (20 mg total) by mouth daily.   No facility-administered encounter medications on file as of 03/16/2021.   Thank you for the opportunity to participate in the care of Mr. Grandville Silos.  The palliative care team will continue to follow. Please call our office at [phone] if we can be of additional assistance.   Note: Portions of this note were generated with [employer]. Dictation errors may occur despite best attempts at proofreading.  Teodoro Spray, NP

## 2021-05-18 NOTE — Progress Notes (Signed)
? ? ?  SUBJECTIVE:  ? ?CHIEF COMPLAINT / HPI:  ? ?Skin tear on penis: ?81 year old male presenting a small superficial skin tear on the dorsal aspect of his penis.  His caregiver states that she noticed that a few days prior.  They have been using cream and moisturizing ointments on it and it has essentially resolved.  Because that schedule the appointment they wanted to come in to see if we had any further recommendations.  He denies any pain or drainage from the area. ? ?PERTINENT  PMH / PSH: Cerebral palsy, wheelchair-bound ? ?OBJECTIVE:  ? ?BP 139/79   Pulse 95   SpO2 99%   ? ?General: NAD, pleasant, able to participate in exam ?Respiratory: No respiratory distress ?Skin: warm and dry, dorsal aspect of the penis with a very thin well-healing superficial skin tear present this is approximately the width of a paper cut and 1 cm in length.  There is no drainage or erythema.  There is no pain with palpating the lesion.  Chaperone used for encounter. ?Psych: Normal affect and mood ? ?ASSESSMENT/PLAN:  ? ?Superficial skin tear of penis: ?It is well-healing at this time.  No erythema, pain, redness.  No need for antibiotics.  Discussed multiple options should this happen again including Vaseline.  I endorsed to them that they can always send a photo of something like this that can potentially save them an office visit since they have [place].  They were appreciative of this.  Recommended following up as needed. ? ? ?Lurline Del, DO ?[place]  ?

## 2021-05-19 ENCOUNTER — Encounter: Payer: Self-pay | Admitting: Family Medicine

## 2021-05-19 ENCOUNTER — Ambulatory Visit (INDEPENDENT_AMBULATORY_CARE_PROVIDER_SITE_OTHER): Payer: Medicare Other | Admitting: Family Medicine

## 2021-05-19 ENCOUNTER — Other Ambulatory Visit: Payer: Self-pay

## 2021-05-19 ENCOUNTER — Ambulatory Visit (INDEPENDENT_AMBULATORY_CARE_PROVIDER_SITE_OTHER): Payer: Medicare Other

## 2021-05-19 VITALS — BP 139/79 | HR 95

## 2021-05-19 DIAGNOSIS — S30812A Abrasion of penis, initial encounter: Secondary | ICD-10-CM

## 2021-05-19 DIAGNOSIS — Z23 Encounter for immunization: Secondary | ICD-10-CM

## 2021-05-19 NOTE — Patient Instructions (Signed)
I saw you today for an abrasion of the top part of the penis.  This is a common area to get an abrasion as this is very thin skin.  Continue to keep the area dry and free of moisture can help minimize this from happening in the future.  If it does happen again and there is no redness or drainage she can use some Vaseline on the area and that will help speed healing.  If you ever have this happen with drainage, redness, significant pain I would like for him to be seen.  Follow-up as needed. ?

## 2021-06-03 ENCOUNTER — Other Ambulatory Visit: Payer: Self-pay | Admitting: Family Medicine

## 2021-07-15 ENCOUNTER — Other Ambulatory Visit: Payer: Self-pay | Admitting: Family Medicine

## 2021-08-22 ENCOUNTER — Encounter: Payer: Self-pay | Admitting: *Deleted

## 2021-08-29 ENCOUNTER — Other Ambulatory Visit: Payer: Self-pay | Admitting: Family Medicine

## 2021-09-04 ENCOUNTER — Other Ambulatory Visit: Payer: Self-pay | Admitting: Family Medicine

## 2021-09-09 IMAGING — DX DG ABDOMEN 1V
1 series · 1 of 1 positions shown · non-contrast
Comparison: None.

CLINICAL DATA: Abdominal pain

EXAM:
ABDOMEN - 1 VIEW

[abdomen kub]
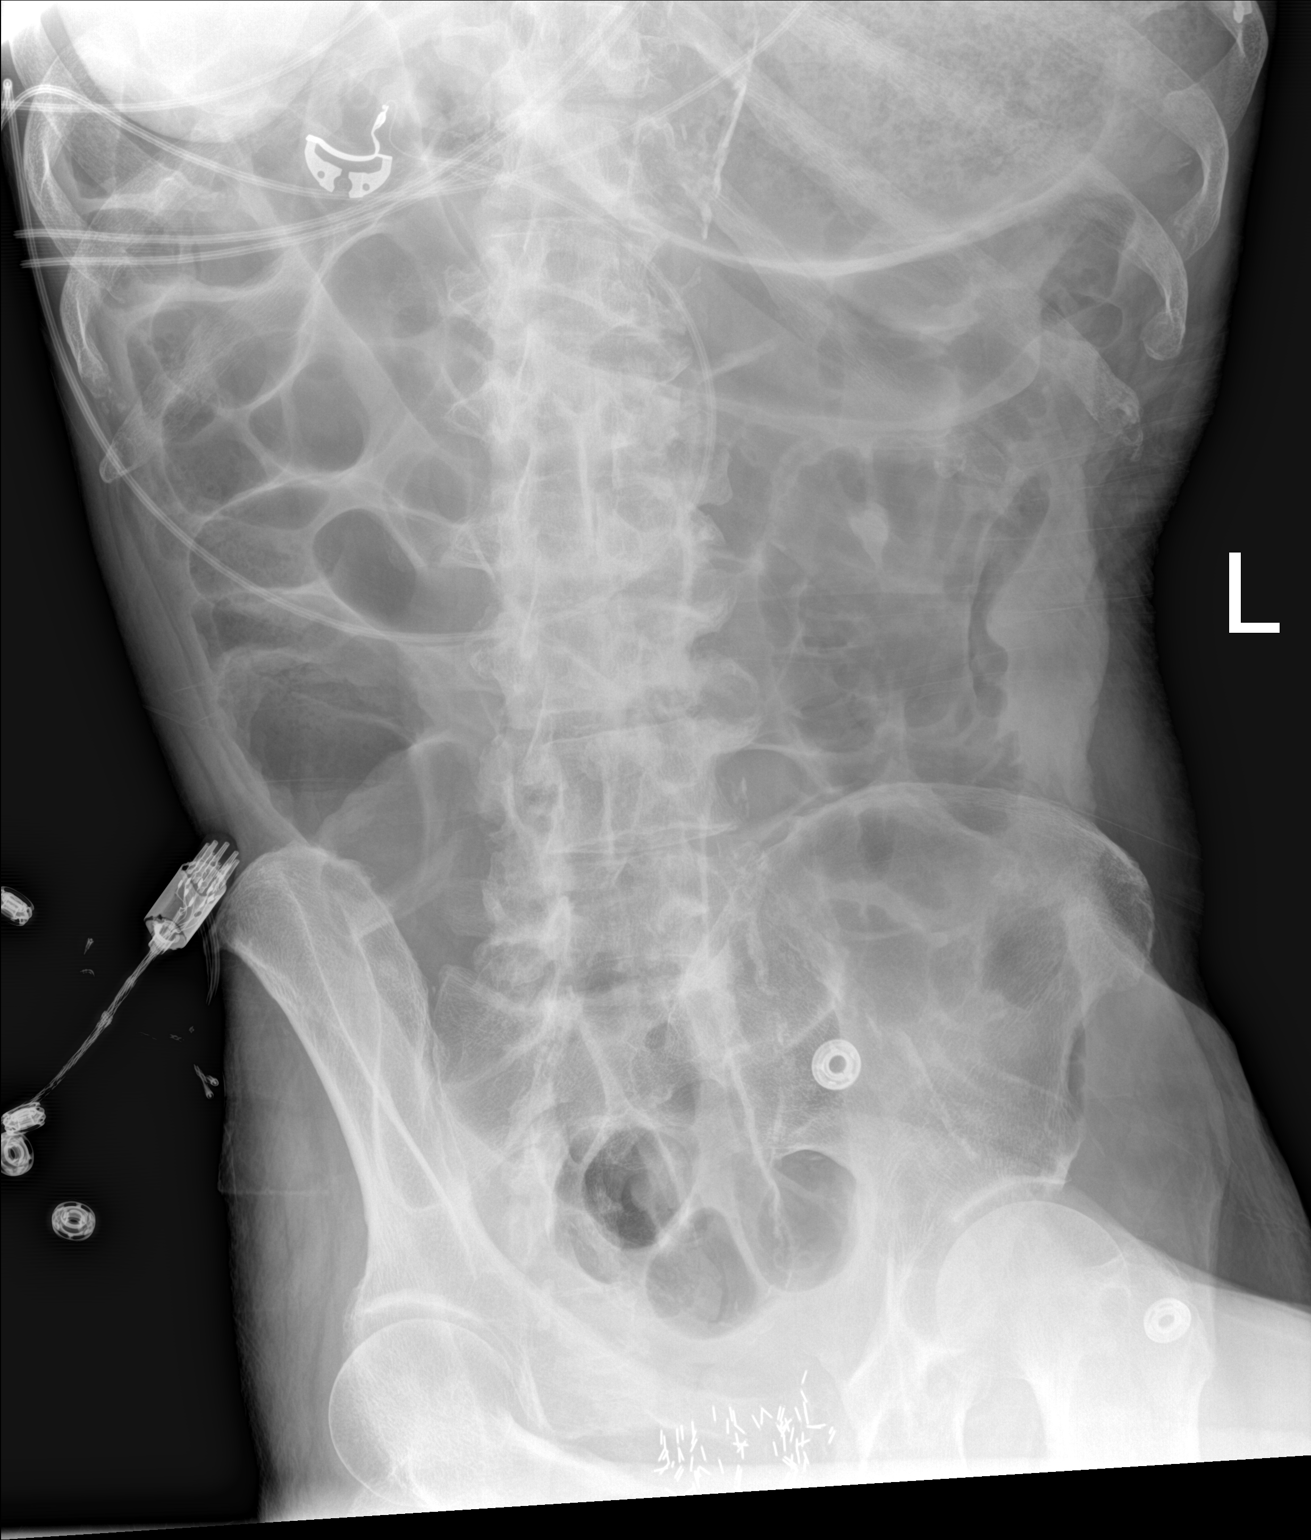

[1 of 1 positions shown; findings below may reference images not displayed]

FINDINGS: Stomach incompletely visualized. Suggestion of gastric distension
from air. Multiple loops of mildly dilated small bowel without
air-fluid levels. Air is seen in colon. No free air evident on
supine examination. There is aortic and iliac artery
atherosclerosis. There are seed implants in the prostate.
IMPRESSION: Bowel dilatation without air-fluid levels. Suspect a degree of ileus
or potential enteritis. Bowel obstruction felt to be less likely. No
free air evident on supine examination.

Seed implants in the prostate.

Aortic Atherosclerosis (G3IT0-4Q5.5).

## 2021-09-12 IMAGING — DX DG CHEST 2V
2 series · 2 of 2 positions shown · non-contrast
Comparison: 07/21/2020

CLINICAL DATA: Tachypnea for 1 day

EXAM:
CHEST - 2 VIEW

[chest ap]
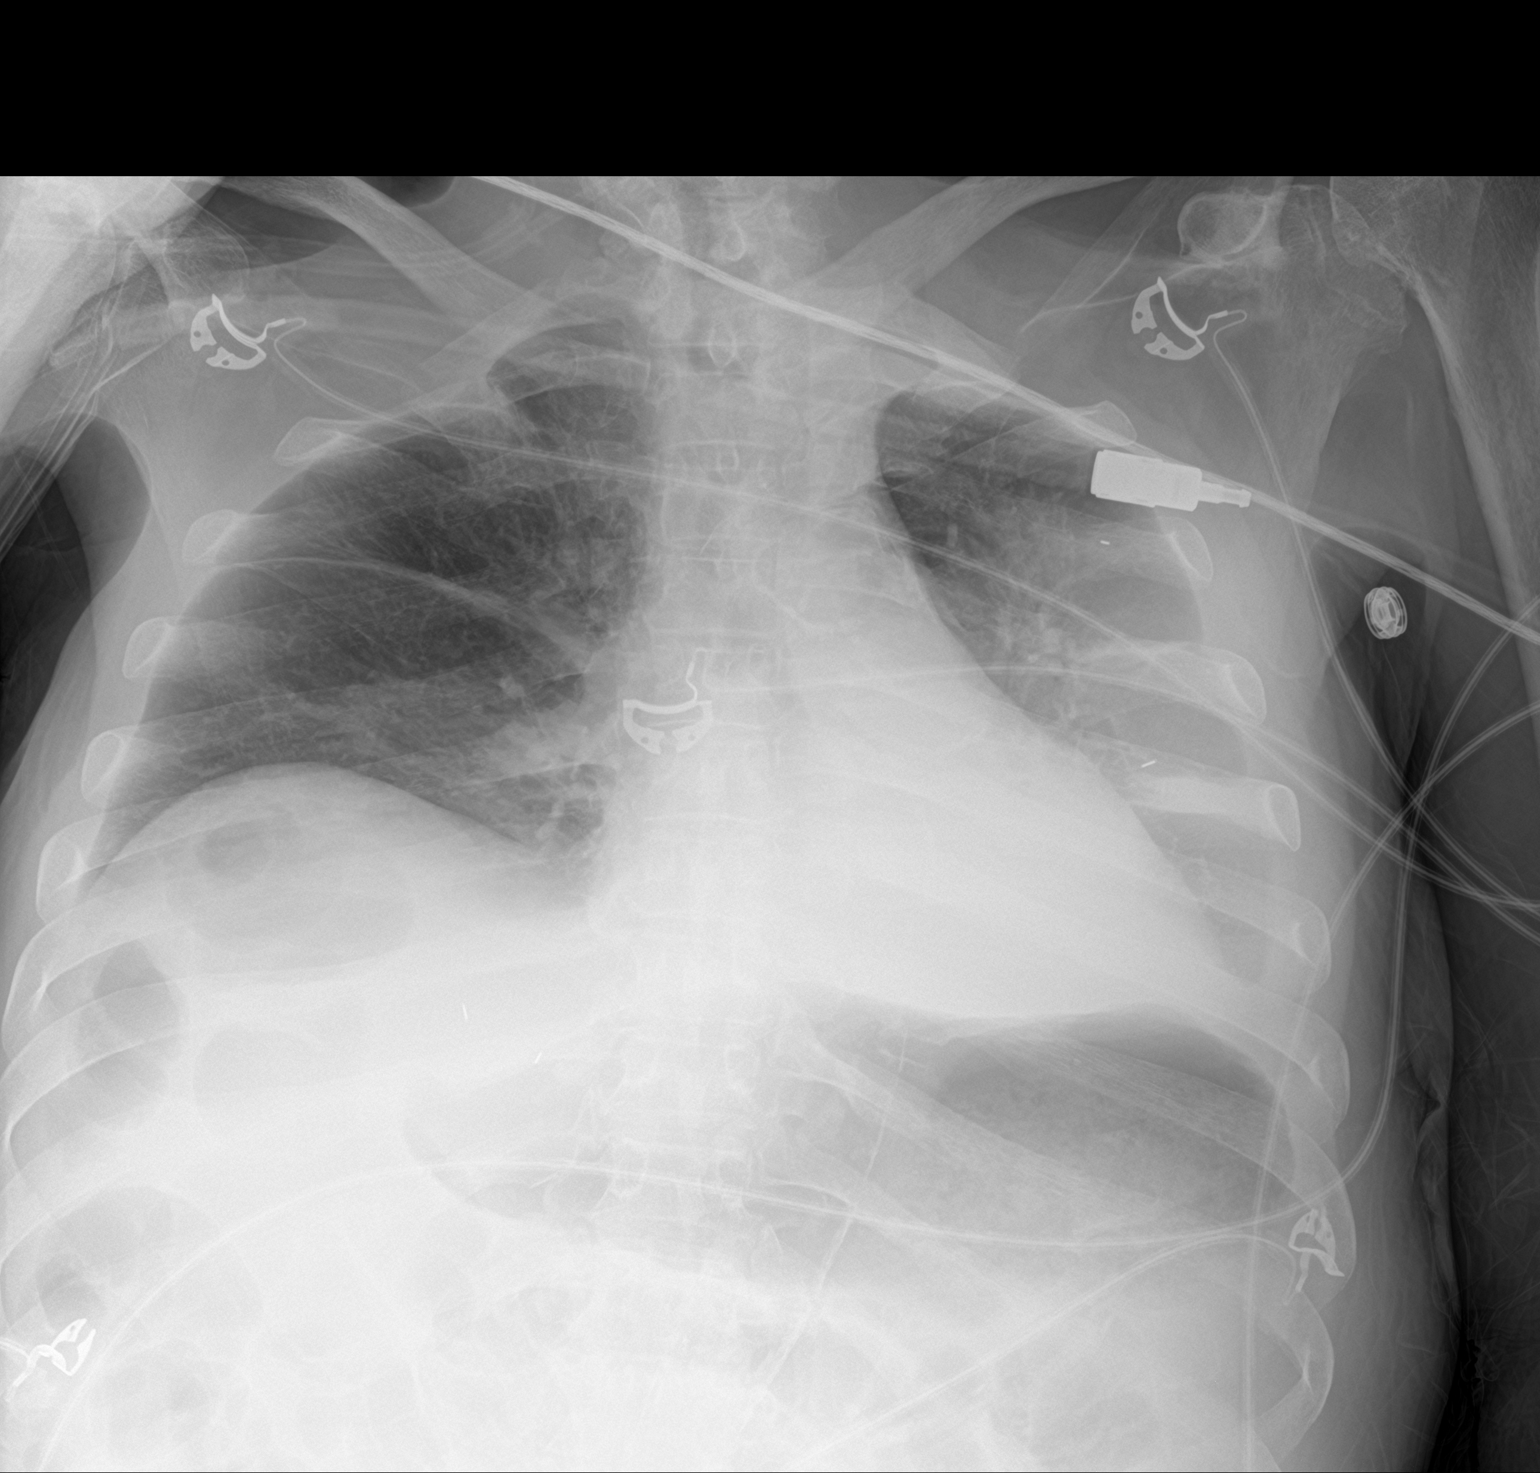

[chest lat]
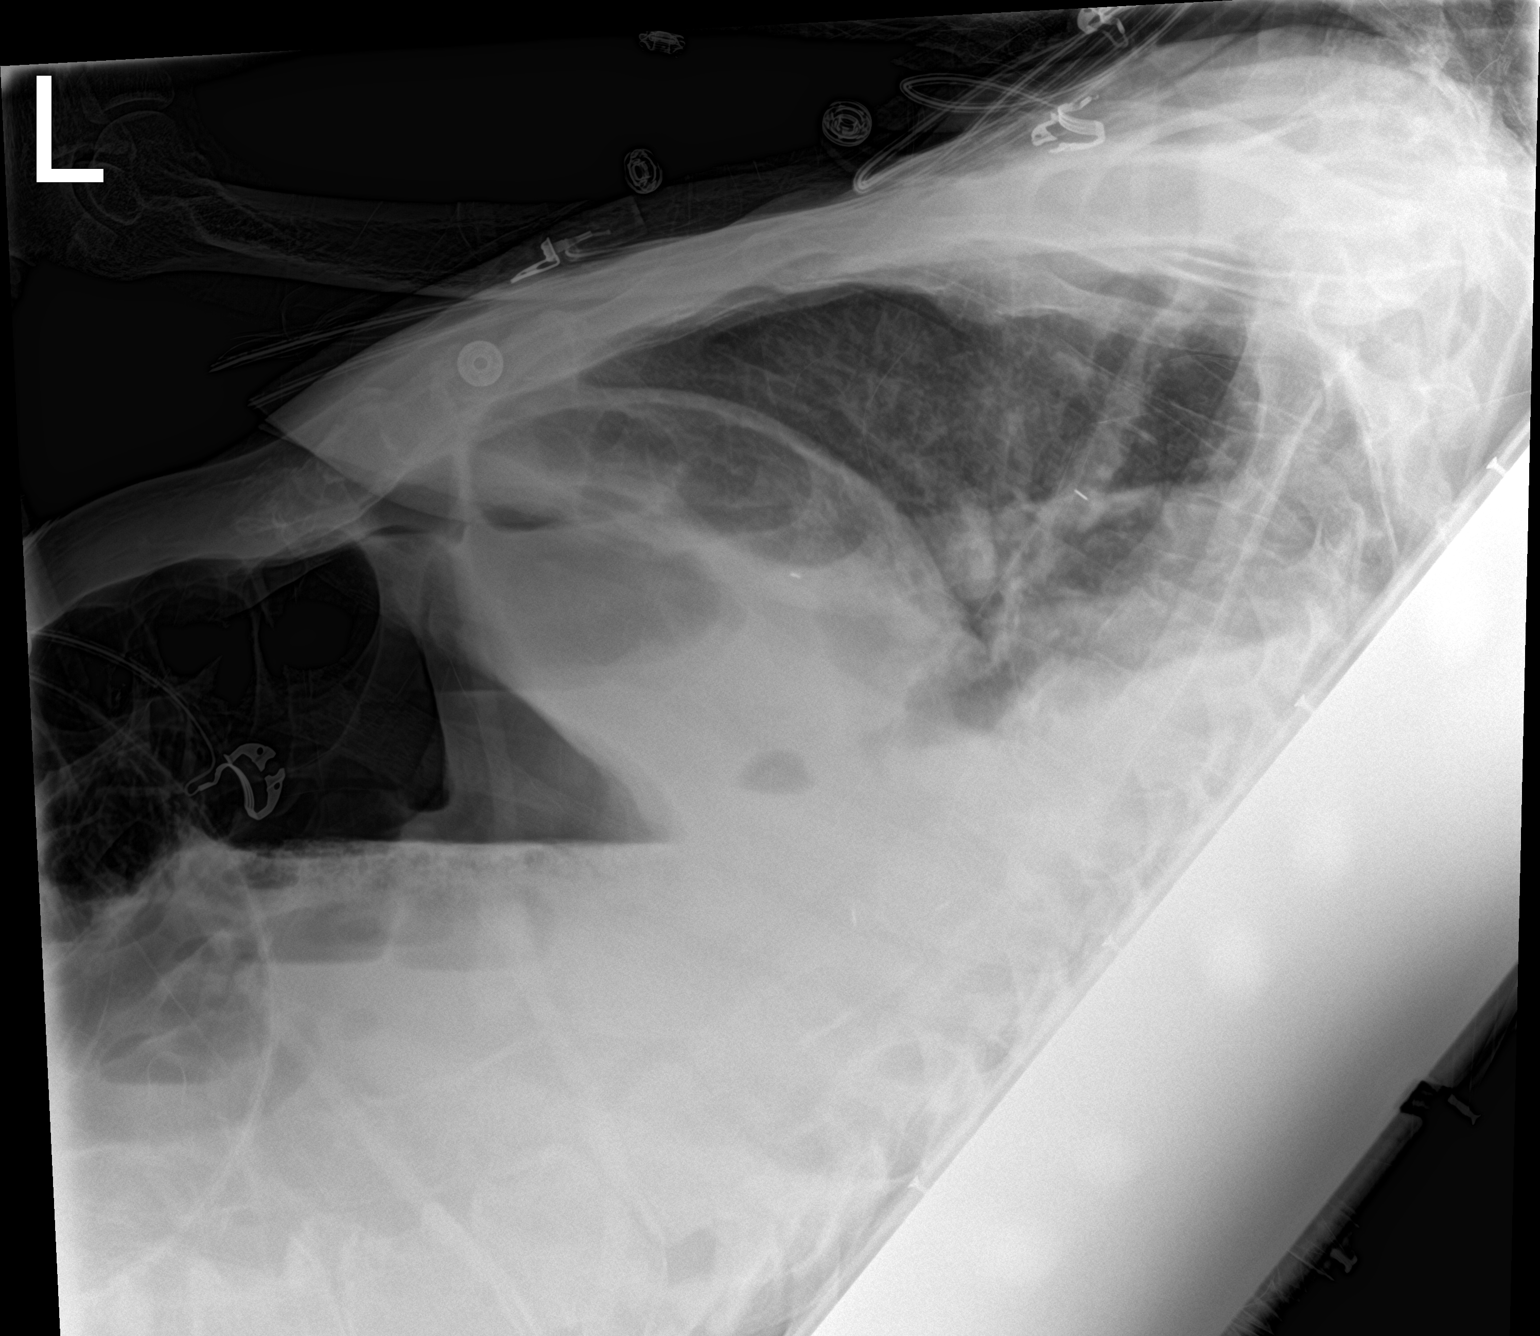

[2 of 2 positions shown; findings below may reference images not displayed]

FINDINGS: Cardiac shadow is within normal limits. The overall inspiratory
effort is poor but stable. Increasing effusion on the left is noted.
Left retrocardiac consolidation is noted as well.
IMPRESSION: Left retrocardiac consolidation with increasing left-sided pleural
effusion.

## 2021-09-14 IMAGING — DX DG CHEST 1V
1 series · 1 of 1 positions shown · non-contrast
Comparison: None.

CLINICAL DATA: Vomiting and leukocytosis.

EXAM:
CHEST  1 VIEW

[x chest ap]
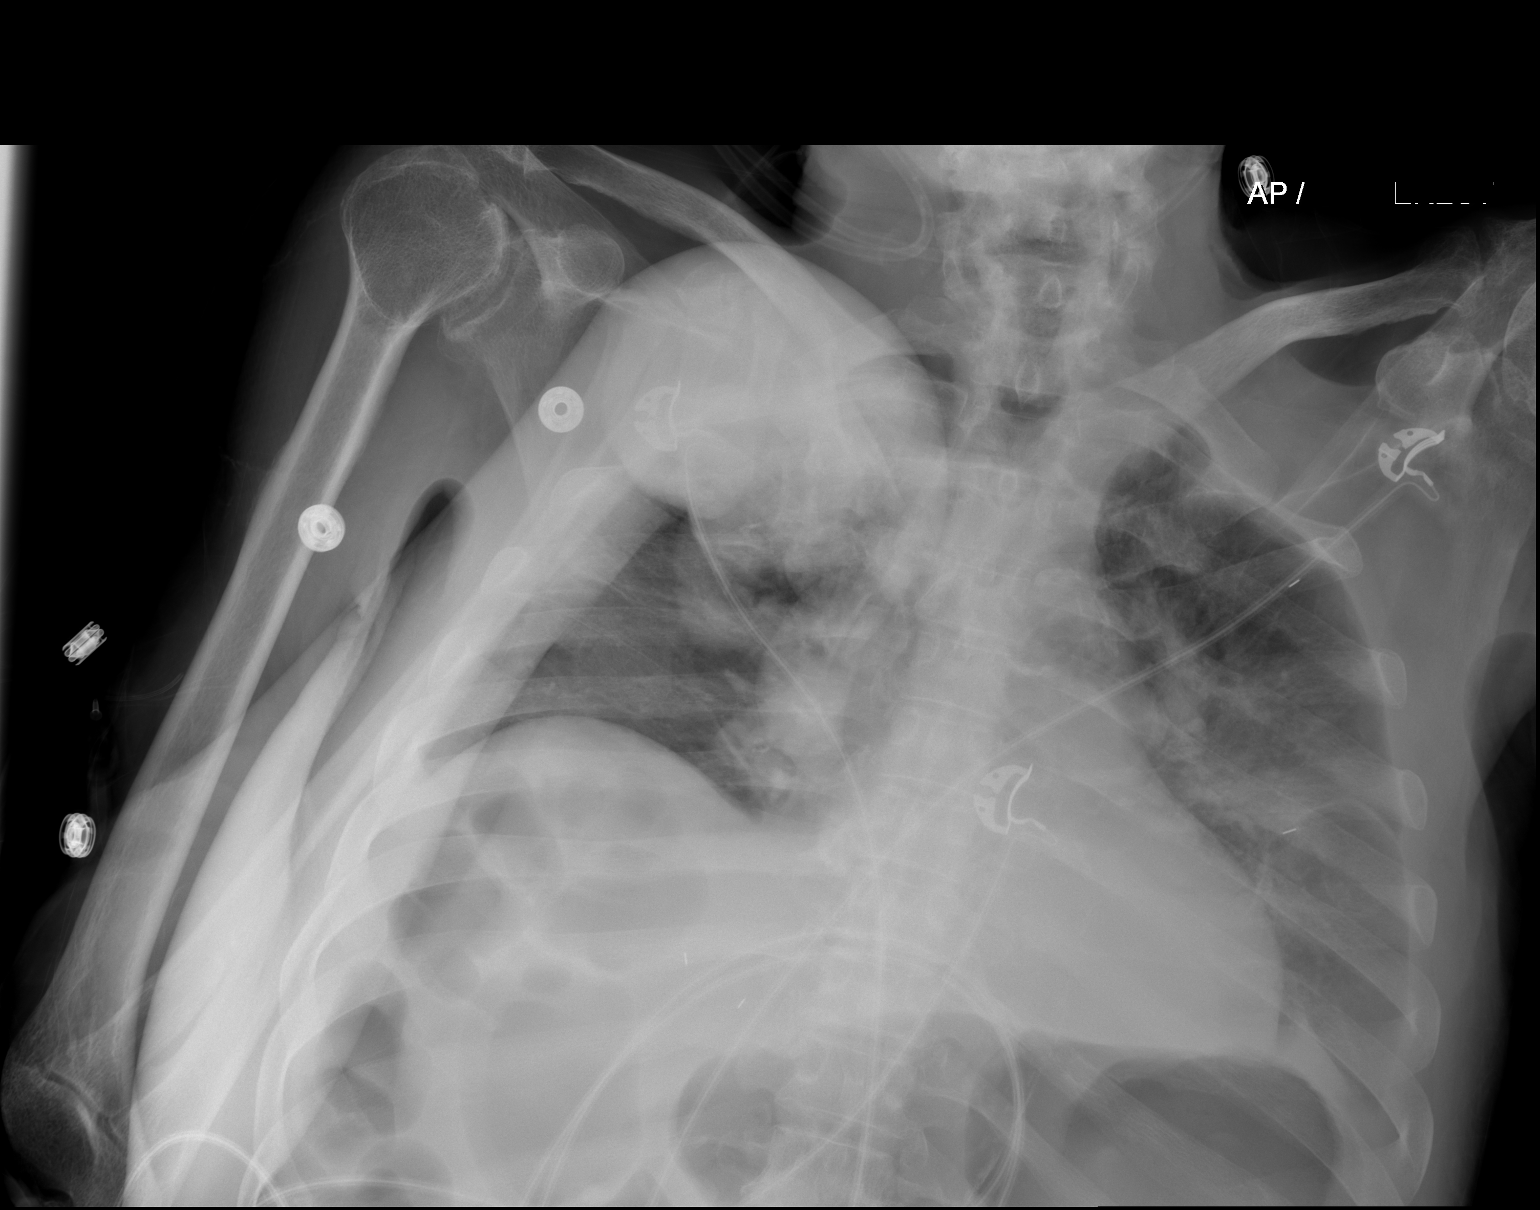

[1 of 1 positions shown; findings below may reference images not displayed]

FINDINGS: AP semiupright view of the chest shows low volumes with asymmetric
elevation of the right hemidiaphragm. Patient's right arm obscures
the right lung. There is hazy airspace disease in the left base with
left pleural effusion and some tethering or tenting of the major
fissure. Heart size normal. Diffuse gaseous distention of the
stomach and colon noted under the hemidiaphragms.
IMPRESSION: 1. Low lung volumes with hazy atelectasis or pneumonia in the left
base and small left pleural effusion.
2. Diffuse gaseous distention of the stomach and colon.

## 2021-10-15 ENCOUNTER — Other Ambulatory Visit: Payer: Self-pay | Admitting: Family Medicine

## 2021-11-23 ENCOUNTER — Other Ambulatory Visit: Payer: Self-pay

## 2021-11-23 MED ORDER — POTASSIUM CHLORIDE ER 10 MEQ PO CPCR: 10.0000 meq | ORAL_CAPSULE | Freq: Every day | ORAL | 1 refills | Status: DC

## 2021-12-20 NOTE — Progress Notes (Unsigned)
    SUBJECTIVE:   CHIEF COMPLAINT / HPI:   Lipoma -A couple of weeks ago, sister was bathing the patient and noticed a lump on his back -The lump has decreased in size since she first noticed it -It has not been hard/indurated, it has not drained any fluid, no surrounding rashes/lesions/bites/wounds.  It has not been red or warm -Has not had this before, no other similar lumps on his body -Patient denies pain, tenderness.  He states that he did not even know that the lump was there.  Denies recent fevers, sickness.  Denies any pain   Cerebral Palsy -Sister is requesting home health services to assist her as she is the patient's primary caregiver -She assist with many of his ADLs including feeding, bathing, changing, bowel and bladder management -She brought a form with her to have filled out, states she is okay with coming back to pick it up   PERTINENT  PMH / PSH: Cerebral palsy  OBJECTIVE:   BP 123/81   Pulse 80   Ht 5\' 7"  (1.702 m)   SpO2 97%   BMI 18.65 kg/m    Gen: NAD, in wheelchair, alert and responsive CV: RRR no MRG Pulm: CTAB normal WOB on RA Derm: 5-7cm mobile and raised lump noted on left upper back. Soft and mobile, not indurated, no fluctuance, no surrounding erythema or wounds  ASSESSMENT/PLAN:   Lipoma Soft, mobile lipoma left upper back.  About 5-7 cm in diameter.  No induration, erythema, pain.  Given lack of pain and patient on Eliquis, will hold off on any intervention at this time. 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  She performs many of the patient's ADLs for him, including bathing, bowel and bladder management, feeding. -Referral to community care coordinator -Will fill out form brought in by sister     Vonna Drafts, MD [facility]

## 2021-12-21 ENCOUNTER — Telehealth: Payer: Self-pay | Admitting: Family Medicine

## 2021-12-21 ENCOUNTER — Ambulatory Visit (INDEPENDENT_AMBULATORY_CARE_PROVIDER_SITE_OTHER): Payer: Medicare Other | Admitting: Family Medicine

## 2021-12-21 ENCOUNTER — Encounter: Payer: Self-pay | Admitting: Family Medicine

## 2021-12-21 VITALS — BP 123/81 | HR 80 | Ht 67.0 in

## 2021-12-21 DIAGNOSIS — G800 Spastic quadriplegic cerebral palsy: Secondary | ICD-10-CM | POA: Diagnosis not present

## 2021-12-21 DIAGNOSIS — D179 Benign lipomatous neoplasm, unspecified: Secondary | ICD-10-CM | POA: Insufficient documentation

## 2021-12-21 DIAGNOSIS — D1779 Benign lipomatous neoplasm of other sites: Secondary | ICD-10-CM | POA: Diagnosis not present

## 2021-12-21 DIAGNOSIS — Z23 Encounter for immunization: Secondary | ICD-10-CM | POA: Diagnosis not present

## 2021-12-21 NOTE — Telephone Encounter (Signed)
Reviewed, completed, and signed form.  Note routed to RN team inbasket and placed completed form in RN Wall pocket in the front office.    Form brought in by sister for home health services  Sister Lynnell Chad [phone] states she will be able to pick up form  August Albino, MD

## 2021-12-21 NOTE — Assessment & Plan Note (Signed)
Sister is primary caregiver and is requesting home health.  She performs many of the patient's ADLs for him, including bathing, bowel and bladder management, feeding. -Referral to community care coordinator -Will fill out form brought in by sister

## 2021-12-21 NOTE — Assessment & Plan Note (Addendum)
Soft, mobile lipoma left upper back.  About 5-7 cm in diameter.  No induration, erythema, pain.  Given lack of pain and patient on Eliquis, will hold off on any intervention at this time. -continue to monitor, return precautions and information/reassurance provided

## 2021-12-21 NOTE — Patient Instructions (Signed)
It was great to see you today! Thank you for choosing [facility] for your primary care. Rick Schwartz was seen for a lipoma on his back.  Today we addressed: Rick Schwartz has a lipoma on his back.  There is nothing to do about this right now.  Please see the attached handout for more information.  Please give [place] a call if the lipoma becomes hard, infected, or grows a lot in size. I have placed a referral to our community care coordinator who may be able to help with your home health needs.  I will also fill out your form and have it ready for you within a couple of days.  If you haven't already, sign up for My Chart to have easy access to your labs results, and communication with your primary care physician.  Call the clinic at [phone] if your symptoms worsen or you have any concerns.  You should return to our clinic No follow-ups on file. Please arrive 15 minutes before your appointment to ensure smooth check in process.  We appreciate your efforts in making this happen.  Thank you for allowing me to participate in your care, August Albino, MD 12/21/2021, 3:55 PM PGY-1, [place]

## 2021-12-26 ENCOUNTER — Telehealth: Payer: Self-pay | Admitting: *Deleted

## 2021-12-26 NOTE — Chronic Care Management (AMB) (Signed)
  Care Coordination   Note   12/26/2021 Name: Rick Schwartz MRN: [id number] DOB: 08-19-1940  Rick Schwartz is a 81 y.o. year old male who sees August Albino, MD for primary care. I reached out to Sueanne Margarita by phone today to offer care coordination services.  [name] was given information about Care Coordination services today including:   The Care Coordination services include support from the care team which includes your Nurse Coordinator, Clinical Social Worker, or Pharmacist.  The Care Coordination team is here to help remove barriers to the health concerns and goals most important to you. Care Coordination services are voluntary, and the patient may decline or stop services at any time by request to their care team member.   Care Coordination Consent Status: Patient sister Rick Schwartz DPR on file agreed to services and verbal consent obtained.   Follow up plan:  Telephone appointment with care coordination team member scheduled for:  12/27/21  Encounter Outcome:  Pt. Scheduled  [place]  Direct Dial: [phone]

## 2021-12-26 NOTE — Telephone Encounter (Signed)
Form placed up front for pick up.   Copy made for batch scanning.   Sister has been made aware.

## 2021-12-27 ENCOUNTER — Ambulatory Visit: Payer: Self-pay | Admitting: Licensed Clinical Social Worker

## 2021-12-27 NOTE — Patient Outreach (Signed)
  Care Coordination   Initial Visit Note   12/27/2021 Name: JAYQUON THEILER MRN: [id number] DOB: [birth year]  LAURIE LOVEJOY is a 81 y.o. year old male who sees August Albino, MD for primary care. I spoke with  Sueanne Margarita by phone today.  What matters to the patients health and wellness today?  CAP    Goals Addressed               This Visit's Progress     Care Coordination Activities- CAP (pt-stated)        Patient completed CAP application and faxed back on today.  Patient denied needing food, housing or transportation.  SW will follow up with patient within 30 days.         SDOH assessments and interventions completed:  Yes     Care Coordination Interventions Activated:  Yes  Care Coordination Interventions:  Yes, provided   Follow up plan: Follow up call scheduled for Next 30 days    Encounter Outcome:  Pt. Visit Completed

## 2021-12-27 NOTE — Patient Instructions (Signed)
Visit Information  Thank you for taking time to visit with me today. Please don't hesitate to contact me if I can be of assistance to you.   Following are the goals we discussed today:   Goals Addressed               This Visit's Progress     Care Coordination Activities- CAP (pt-stated)        Patient completed CAP application and faxed back on today.  Patient denied needing food, housing or transportation.  SW will follow up with patient within 30 days.           Patient verbalizes understanding of instructions and care plan provided today and agrees to view in [place]. Active MyChart status and patient understanding of how to access instructions and care plan via MyChart confirmed with patient.     The care management team will reach out to the patient again over the next 30 days.   Lenor Derrick , MSW Social Worker [facility] Care Management  [phone]

## 2022-01-17 ENCOUNTER — Ambulatory Visit: Payer: Self-pay | Admitting: Licensed Clinical Social Worker

## 2022-01-17 NOTE — Patient Outreach (Signed)
  Care Coordination   01/17/2022 Name: Rick Schwartz MRN: [id number] DOB: Jul 25, 1940   Care Coordination Outreach Attempts:  An unsuccessful telephone outreach was attempted today to offer the patient information about available care coordination services as a benefit of their health plan.   Follow Up Plan:  Additional outreach attempts will be made to offer the patient care coordination information and services.   Encounter Outcome:  No Answer  Care Coordination Interventions Activated:  No   Care Coordination Interventions:  No, not indicated    Milus Height, BSW, MSW, [place]  Social Worker [facility] Care Management  [phone]

## 2022-02-14 ENCOUNTER — Ambulatory Visit: Payer: Self-pay | Admitting: Licensed Clinical Social Worker

## 2022-02-14 NOTE — Patient Instructions (Signed)
Visit Information  Thank you for taking time to visit with me today. Please don't hesitate to contact me if I can be of assistance to you.   Following are the goals we discussed today:   Goals Addressed               This Visit's Progress     Care Coordination Activities- CAP (pt-stated)        Patient completed CAP application and faxed back. Application still pending. Patient daughter stated she will follow up with agency and she still has copies of the fax and application.           Patient verbalizes understanding of instructions and care plan provided today and agrees to view in [place]. Active MyChart status and patient understanding of how to access instructions and care plan via MyChart confirmed with patient.     No further follow up required: .  Milus Height, Arita Miss , MSW, [place] Social Worker [facility] Care Management  [phone]

## 2022-02-14 NOTE — Patient Outreach (Signed)
  Care Coordination   Follow Up Visit Note   02/14/2022 Name: Rick Schwartz MRN: [id number] DOB: Jun 25, 1940  Rick Schwartz is a 81 y.o. year old male who sees August Albino, MD for primary care. I spoke with  Rick Schwartz by phone today.  What matters to the patients health and wellness today?  CAP    Goals Addressed               This Visit's Progress     Care Coordination Activities- CAP (pt-stated)        Patient completed CAP application and faxed back. Application still pending. Patient daughter stated she will follow up with agency and she still has copies of the fax and application.         SDOH assessments and interventions completed:  Yes     Care Coordination Interventions:  Yes, provided   Follow up plan: No further intervention required.   Encounter Outcome:  Pt. Visit Completed

## 2022-02-19 ENCOUNTER — Telehealth: Payer: Self-pay | Admitting: Family Medicine

## 2022-02-19 NOTE — Telephone Encounter (Signed)
Reviewed, completed, and signed form.  Note routed to RN team inbasket and placed completed form in RN Wall pocket in the front office.    August Albino, MD

## 2022-02-19 NOTE — Telephone Encounter (Signed)
Patient dropped off form at front desk for [place].  Verified that patient section of form has been completed.  Last DOS/WCC with PCP was 12/21/21.  Placed form in blue team folder to be completed by clinical staff.  Creig Hines

## 2022-02-19 NOTE — Telephone Encounter (Signed)
Gave form to doctor while in office today.

## 2022-02-20 NOTE — Telephone Encounter (Signed)
Form placed up front for pick up.   Copy made for batch scanning.   Patient aware.  

## 2022-02-22 ENCOUNTER — Other Ambulatory Visit: Payer: Self-pay

## 2022-02-23 MED ORDER — LOSARTAN POTASSIUM 25 MG PO TABS: ORAL_TABLET | ORAL | 2 refills | Status: DC

## 2022-02-28 ENCOUNTER — Telehealth: Payer: Self-pay | Admitting: Family Medicine

## 2022-02-28 NOTE — Telephone Encounter (Signed)
Patients sister here to drop off a form to be completed by PCP. She says Joeseph Amor completed the form previously but it was rejected due to some missing information. Sister would like to pick forms back up. Placed in blue team folder for completion.

## 2022-03-02 NOTE — Telephone Encounter (Signed)
Clinical info completed on Medicaid Service form.  Placed form in PCP's box for completion.    When form is completed, please route note to "RN Team" and place in wall pocket in front office.   Salvatore Marvel, CMA

## 2022-03-05 ENCOUNTER — Other Ambulatory Visit: Payer: Self-pay

## 2022-03-05 MED ORDER — APIXABAN 5 MG PO TABS: ORAL_TABLET | ORAL | 2 refills | Status: DC

## 2022-03-08 NOTE — Telephone Encounter (Signed)
Received medical records from [facility]. Called sister and informed that paperwork has been completed. Copy made and placed in batch scanning. Original at front desk for pick up.   Talbot Grumbling, RN

## 2022-03-08 NOTE — Telephone Encounter (Signed)
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   Specialized Treatments  Per inpatient physical therapy, patient total assist at home with family physically lifting him to a WC. Total assist during interview for rolling and repositioning with pt stating baseline without need for further therapy. PT would benefit from hoyer lift at home to decrease caregiver burden   Follows with CCM for care coordination

## 2022-04-28 ENCOUNTER — Other Ambulatory Visit: Payer: Self-pay | Admitting: Family Medicine

## 2022-05-16 ENCOUNTER — Other Ambulatory Visit: Payer: Self-pay | Admitting: Family Medicine

## 2022-05-17 ENCOUNTER — Other Ambulatory Visit: Payer: Self-pay

## 2022-05-17 MED ORDER — FUROSEMIDE 20 MG PO TABS: ORAL_TABLET | ORAL | 2 refills | Status: DC

## 2022-05-22 NOTE — Telephone Encounter (Signed)
Spoke with patients sister made appt for 3/18. Salvatore Marvel, CMA

## 2022-06-04 ENCOUNTER — Encounter: Payer: Self-pay | Admitting: Family Medicine

## 2022-06-04 ENCOUNTER — Ambulatory Visit (INDEPENDENT_AMBULATORY_CARE_PROVIDER_SITE_OTHER): Payer: 59 | Admitting: Family Medicine

## 2022-06-04 VITALS — BP 93/72 | HR 89 | Ht 67.0 in

## 2022-06-04 DIAGNOSIS — I5022 Chronic systolic (congestive) heart failure: Secondary | ICD-10-CM

## 2022-06-04 DIAGNOSIS — E7800 Pure hypercholesterolemia, unspecified: Secondary | ICD-10-CM | POA: Diagnosis not present

## 2022-06-04 MED ORDER — ROSUVASTATIN CALCIUM 20 MG PO TABS: ORAL_TABLET | ORAL | 1 refills | Status: DC

## 2022-06-04 MED ORDER — POTASSIUM CHLORIDE ER 10 MEQ PO CPCR: ORAL_CAPSULE | ORAL | 1 refills | Status: DC

## 2022-06-04 NOTE — Patient Instructions (Signed)
It was wonderful to see you today.  Please bring ALL of your medications with you to every visit.   Updates from today's visit:  I will give you a call if any of your results from today are abnormal, otherwise you will receive a message on MyChart  Please follow up in 6 months   Thank you for choosing [facility].   Please call [phone] with any questions about today's appointment.  Please be sure to schedule follow up at the front  desk before you leave today.   August Albino, MD  Family Medicine

## 2022-06-04 NOTE — Progress Notes (Signed)
  SUBJECTIVE:   CHIEF COMPLAINT / HPI:   Here for routine follow up and BMP check (on Lasix, last BMP 01/2021) -Sister reports adherence to his Lasix and other medications.  She is his primary caregiver but recently underwent assessment at home to receive additional assistance  Requesting refill on Crestor and potassium  No recent illness.  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 LUE more mobile but contracture present in hands. RLE s/p AKA. No edema in LLE.   ASSESSMENT/PLAN:   No acute concerns identified today.  Will check BMP to evaluate kidney function and electrolytes given that he is on Lasix.  Defer other screening labs given age and comorbidities.  Optimistic about receiving additional help at home.  Blood pressure soft today, advised to continue checking at home and let us know if he still has BP in the 90s at home.  Chronic HFrEF (heart failure with reduced ejection fraction) (HCC) -     Basic metabolic panel -     Potassium Chloride ER; TAKE 1 CAPSULE(10 MEQ) BY MOUTH DAILY  Dispense: 90 capsule; Refill: 1  HYPERCHOLESTEROLEMIA -     Rosuvastatin Calcium; TAKE 1 TABLET(20 MG) BY MOUTH DAILY  Dispense: 90 tablet; Refill: 1   Meds ordered this encounter  Medications   rosuvastatin (CRESTOR) 20 MG tablet    Sig: TAKE 1 TABLET(20 MG) BY MOUTH DAILY    Dispense:  90 tablet    Refill:  1    ZERO refills remain on this prescription. Your patient is requesting  advance approval of refills for this medication to PREVENT ANY MISSED DOSES   potassium chloride (MICRO-K) 10 MEQ CR capsule    Sig: TAKE 1 CAPSULE(10 MEQ) BY MOUTH DAILY    Dispense:  90 capsule    Refill:  1   Return in about 6 months (around 12/05/2022) for health maintenance.  Precepted with Dr. McDiarmid  August Albino, [facility]

## 2022-06-05 LAB — BASIC METABOLIC PANEL
BUN/Creatinine Ratio: 9 — ABNORMAL LOW (ref 10–24)
BUN: 17 mg/dL (ref 8–27)
CO2: 28 mmol/L (ref 20–29)
Calcium: 8.9 mg/dL (ref 8.6–10.2)
Chloride: 94 mmol/L — ABNORMAL LOW (ref 96–106)
Creatinine, Ser: 1.94 mg/dL — ABNORMAL HIGH (ref 0.76–1.27)
Glucose: 141 mg/dL — ABNORMAL HIGH (ref 70–99)
Potassium: 2.9 mmol/L — ABNORMAL LOW (ref 3.5–5.2)
Sodium: 142 mmol/L (ref 134–144)
eGFR: 34 mL/min/{1.73_m2} — ABNORMAL LOW (ref >59)

## 2022-06-07 ENCOUNTER — Telehealth: Payer: Self-pay | Admitting: Family Medicine

## 2022-06-07 DIAGNOSIS — N179 Acute kidney failure, unspecified: Secondary | ICD-10-CM

## 2022-06-07 NOTE — Telephone Encounter (Signed)
Called patient's sister to discuss results. Cr 1.94, likely prerenal AKI in the setting of chronic lasix (20mg  qd) use. No recent use of NSAIDs or other notable nephrotoxic agents.  Also with slightly low potassium but they were recently able to pick up potassium supplement refill.  Home BP has normalized more since their office visit, SBP 119 this morning per sister. Advised to call if BP becomes soft.   Advised for pt to return within the next week for lab visit to recheck BMP and also check UA/microscopic/microalbumin. Lab appt scheduled for Monday 3/25 at 10am. May consider holding lasix and/or further CKD workup at that time.   Advised for patient to stay very well hydrated in the meantime. Provided return precautions. No questions/concerns.   August Albino, MD

## 2022-06-11 ENCOUNTER — Other Ambulatory Visit: Payer: 59

## 2022-06-11 DIAGNOSIS — N179 Acute kidney failure, unspecified: Secondary | ICD-10-CM

## 2022-06-12 ENCOUNTER — Telehealth: Payer: Self-pay | Admitting: Family Medicine

## 2022-06-12 NOTE — Telephone Encounter (Signed)
Called patient's sister to discuss BMP result - appears to have AKI with Cr 2.62, up from 1.94 on 3/18, no clear baseline from the last year.  Patient is chronically ill with right AKA and largely nonverbal in the setting of his cerebral palsy, so it is difficult to assess his volume status, but he appeared euvolemic during his office visit on 3/18.    He is currently on 20 mg of Lasix daily and has not changed his dose recently.  Sister reports that the patient has not been having any difficulty breathing or significant leg swelling.  Advised that patient be seen in the ER for further evaluation/workup, for AKI in the setting of HFrEF (most recent echo in 09/2020 with EF <20%; has been on Lasix).    Rick Albino, MD

## 2022-06-13 ENCOUNTER — Encounter (HOSPITAL_COMMUNITY): Payer: Self-pay

## 2022-06-13 ENCOUNTER — Observation Stay (HOSPITAL_COMMUNITY)
Admission: EM | Admit: 2022-06-13 | Discharge: 2022-06-14 | Disposition: A | Discharge status: Home / Self Care | Payer: 59 | Attending: Family Medicine | Admitting: Family Medicine

## 2022-06-13 ENCOUNTER — Observation Stay (HOSPITAL_COMMUNITY): Payer: 59

## 2022-06-13 ENCOUNTER — Emergency Department (HOSPITAL_COMMUNITY): Payer: 59

## 2022-06-13 ENCOUNTER — Other Ambulatory Visit: Payer: Self-pay

## 2022-06-13 DIAGNOSIS — R7989 Other specified abnormal findings of blood chemistry: Secondary | ICD-10-CM | POA: Diagnosis present

## 2022-06-13 DIAGNOSIS — F0390 Unspecified dementia without behavioral disturbance: Secondary | ICD-10-CM | POA: Diagnosis not present

## 2022-06-13 DIAGNOSIS — I110 Hypertensive heart disease with heart failure: Secondary | ICD-10-CM | POA: Insufficient documentation

## 2022-06-13 DIAGNOSIS — Z91040 Latex allergy status: Secondary | ICD-10-CM | POA: Insufficient documentation

## 2022-06-13 DIAGNOSIS — I2510 Atherosclerotic heart disease of native coronary artery without angina pectoris: Secondary | ICD-10-CM | POA: Insufficient documentation

## 2022-06-13 DIAGNOSIS — N179 Acute kidney failure, unspecified: Secondary | ICD-10-CM

## 2022-06-13 DIAGNOSIS — Z8546 Personal history of malignant neoplasm of prostate: Secondary | ICD-10-CM | POA: Insufficient documentation

## 2022-06-13 DIAGNOSIS — Z79899 Other long term (current) drug therapy: Secondary | ICD-10-CM | POA: Insufficient documentation

## 2022-06-13 DIAGNOSIS — Z87891 Personal history of nicotine dependence: Secondary | ICD-10-CM | POA: Diagnosis not present

## 2022-06-13 DIAGNOSIS — Z89611 Acquired absence of right leg above knee: Secondary | ICD-10-CM | POA: Insufficient documentation

## 2022-06-13 DIAGNOSIS — I5022 Chronic systolic (congestive) heart failure: Secondary | ICD-10-CM | POA: Diagnosis not present

## 2022-06-13 DIAGNOSIS — E876 Hypokalemia: Secondary | ICD-10-CM | POA: Diagnosis not present

## 2022-06-13 DIAGNOSIS — Z7901 Long term (current) use of anticoagulants: Secondary | ICD-10-CM | POA: Insufficient documentation

## 2022-06-13 HISTORY — DX: Unspecified intestinal obstruction, unspecified as to partial versus complete obstruction: K56.609

## 2022-06-13 HISTORY — DX: Pneumonia, unspecified organism: J18.9

## 2022-06-13 HISTORY — DX: Acute respiratory failure with hypoxia: J96.01

## 2022-06-13 HISTORY — DX: Acute kidney failure, unspecified: N17.9

## 2022-06-13 LAB — CBC WITH DIFFERENTIAL/PLATELET
Abs Immature Granulocytes: 0.03 10*3/uL (ref 0.00–0.07)
Basophils Absolute: 0.1 10*3/uL (ref 0.0–0.1)
Basophils Relative: 1 %
Eosinophils Absolute: 0.1 10*3/uL (ref 0.0–0.5)
Eosinophils Relative: 1 %
HCT: 45.4 % (ref 39.0–52.0)
Hemoglobin: 15.5 g/dL (ref 13.0–17.0)
Immature Granulocytes: 0 %
Lymphocytes Relative: 18 %
Lymphs Abs: 1.4 10*3/uL (ref 0.7–4.0)
MCH: 29.6 pg (ref 26.0–34.0)
MCHC: 34.1 g/dL (ref 30.0–36.0)
MCV: 86.8 fL (ref 80.0–100.0)
Monocytes Absolute: 0.9 10*3/uL (ref 0.1–1.0)
Monocytes Relative: 11 %
Neutro Abs: 5.3 10*3/uL (ref 1.7–7.7)
Neutrophils Relative %: 69 %
Platelets: 320 10*3/uL (ref 150–400)
RBC: 5.23 MIL/uL (ref 4.22–5.81)
RDW: 13 % (ref 11.5–15.5)
WBC: 7.8 10*3/uL (ref 4.0–10.5)
nRBC: 0 % (ref 0.0–0.2)

## 2022-06-13 LAB — URINALYSIS, ROUTINE W REFLEX MICROSCOPIC
Bilirubin, UA: NEGATIVE
Glucose, UA: NEGATIVE
Ketones, UA: NEGATIVE
Leukocytes,UA: NEGATIVE
Nitrite, UA: NEGATIVE
Specific Gravity, UA: 1.009 (ref 1.005–1.030)
Urobilinogen, Ur: 0.2 mg/dL (ref 0.2–1.0)
pH, UA: 6 (ref 5.0–7.5)

## 2022-06-13 LAB — BASIC METABOLIC PANEL
Anion gap: 17 — ABNORMAL HIGH (ref 5–15)
BUN/Creatinine Ratio: 12 (ref 10–24)
BUN: 31 mg/dL — ABNORMAL HIGH (ref 8–27)
BUN: 28 mg/dL — ABNORMAL HIGH (ref 8–23)
CO2: 24 mmol/L (ref 20–29)
CO2: 28 mmol/L (ref 22–32)
Calcium: 8.1 mg/dL — ABNORMAL LOW (ref 8.6–10.2)
Calcium: 8.1 mg/dL — ABNORMAL LOW (ref 8.9–10.3)
Chloride: 91 mmol/L — ABNORMAL LOW (ref 96–106)
Chloride: 91 mmol/L — ABNORMAL LOW (ref 98–111)
Creatinine, Ser: 2.62 mg/dL — ABNORMAL HIGH (ref 0.76–1.27)
Creatinine, Ser: 2.01 mg/dL — ABNORMAL HIGH (ref 0.61–1.24)
GFR, Estimated: 33 mL/min — ABNORMAL LOW (ref >60)
Glucose, Bld: 114 mg/dL — ABNORMAL HIGH (ref 70–99)
Glucose: 114 mg/dL — ABNORMAL HIGH (ref 70–99)
Potassium: 3 mmol/L — ABNORMAL LOW (ref 3.5–5.2)
Potassium: 2.6 mmol/L — CL (ref 3.5–5.1)
Sodium: 138 mmol/L (ref 134–144)
Sodium: 136 mmol/L (ref 135–145)
eGFR: 24 mL/min/{1.73_m2} — ABNORMAL LOW (ref >59)

## 2022-06-13 LAB — MICROALBUMIN / CREATININE URINE RATIO
Creatinine, Urine: 49.8 mg/dL
Microalb/Creat Ratio: 130 mg/g creat — ABNORMAL HIGH (ref 0–29)
Microalbumin, Urine: 64.5 ug/mL

## 2022-06-13 LAB — MICROSCOPIC EXAMINATION
Bacteria, UA: NONE SEEN
Casts: NONE SEEN /lpf
RBC, Urine: NONE SEEN /hpf (ref 0–2)

## 2022-06-13 LAB — BRAIN NATRIURETIC PEPTIDE: B Natriuretic Peptide: 63 pg/mL (ref 0.0–100.0)

## 2022-06-13 LAB — POTASSIUM: Potassium: 3 mmol/L — ABNORMAL LOW (ref 3.5–5.1)

## 2022-06-13 LAB — MAGNESIUM: Magnesium: 1.2 mg/dL — ABNORMAL LOW (ref 1.7–2.4)

## 2022-06-13 MED ORDER — POTASSIUM CHLORIDE 20 MEQ PO PACK
40.0000 meq | PACK | Freq: Once | ORAL | Status: AC
  Administered 2022-06-13: 40 meq via ORAL
  Filled 2022-06-13: qty 2

## 2022-06-13 MED ORDER — POTASSIUM CHLORIDE CRYS ER 20 MEQ PO TBCR
40.0000 meq | EXTENDED_RELEASE_TABLET | Freq: Once | ORAL | Status: DC
  Filled 2022-06-13: qty 2

## 2022-06-13 MED ORDER — APIXABAN 5 MG PO TABS
5.0000 mg | ORAL_TABLET | Freq: Two times a day (BID) | ORAL | Status: DC
  Administered 2022-06-13 – 2022-06-14 (×2): 5 mg via ORAL
  Filled 2022-06-13 (×2): qty 1

## 2022-06-13 MED ORDER — MAGNESIUM SULFATE 4 GM/100ML IV SOLN
4.0000 g | Freq: Once | INTRAVENOUS | Status: AC
  Administered 2022-06-13: 4 g via INTRAVENOUS
  Filled 2022-06-13: qty 100

## 2022-06-13 MED ORDER — POTASSIUM CHLORIDE 10 MEQ/100ML IV SOLN
10.0000 meq | INTRAVENOUS | Status: AC
  Administered 2022-06-13 (×2): 10 meq via INTRAVENOUS
  Filled 2022-06-13 (×2): qty 100

## 2022-06-13 MED ORDER — NEPRO/CARBSTEADY PO LIQD
237.0000 mL | Freq: Two times a day (BID) | ORAL | Status: DC
  Administered 2022-06-14 (×2): 237 mL via ORAL

## 2022-06-13 MED ORDER — ROSUVASTATIN CALCIUM 5 MG PO TABS
10.0000 mg | ORAL_TABLET | Freq: Every day | ORAL | Status: DC
  Administered 2022-06-14: 10 mg via ORAL
  Filled 2022-06-13: qty 2

## 2022-06-13 MED ORDER — SODIUM CHLORIDE 0.9 % IV BOLUS
500.0000 mL | Freq: Once | INTRAVENOUS | Status: AC
  Administered 2022-06-13: 500 mL via INTRAVENOUS

## 2022-06-13 MED ORDER — ACETAMINOPHEN 325 MG PO TABS: 650.0000 mg | ORAL_TABLET | Freq: Four times a day (QID) | ORAL | Status: DC | PRN

## 2022-06-13 NOTE — Progress Notes (Signed)
Family medicine teaching service will be admitting this patient. Our pager information can be located in the physician sticky notes, treatment team sticky notes, and the headers of all our official daily progress notes.   FAMILY MEDICINE TEACHING SERVICE Patient - Please contact intern pager [phone] or text page via website [email or web address] (login: [email or web address]) for questions regarding care. DO NOT page listed attending provider unless there is no answer from the number above.   Rick Damron, MD PGY-3, [facility] pager [phone]   

## 2022-06-13 NOTE — Assessment & Plan Note (Addendum)
Creatinine AM 1.49>2.01>2.62. Creatinine 0.65 on 02/02/21, unclear baseline. Repeat K 3.3, given additional Kclor. Continue holding home losartan, restart lasix given HFrEF. Renal U/S wnl.  -AM BMP -Caution with fluid resuscitation due to HFrEF -Replete potassium as need -Hold home losartan 25mg  daily at discharge -restart home lasix

## 2022-06-13 NOTE — Assessment & Plan Note (Signed)
Last ECHO EF <20% in 2022. -Hold Losartan as above -Repeat ECHO

## 2022-06-13 NOTE — Progress Notes (Signed)
Called and spoke with [name]. She confirmed patient cannot make his own medical decisions. Patient lives with her - she is the care giver and Media planner.   She reports she is his legal POA but does not yet have paperwork for HCPOA.   She confirms DNR status - set during last hospitalization. She has the paperwork at home.   Patient is wheel chair bound at baseline. She reports they use pampers and wipes for urine and bowel movements.   Ezequiel Essex, MD

## 2022-06-13 NOTE — ED Triage Notes (Signed)
Pt arrives POV coming from home. Had labs drawn on Monday and received a call yesterday that is kidney function was abnormal. Pt denies complaints at this time.

## 2022-06-13 NOTE — H&P (Addendum)
Hospital Admission History and Physical Service Pager: [phone]  Patient name: Rick Schwartz Medical record number: [id number] Date of Birth: Feb 23, 1941 Age: 82 y.o. Gender: male  Primary Care Provider: August Albino, MD Consultants: none Code Status: DNR, Discussed with patient's medical decision maker Lynnell Chad Preferred Emergency Contact:  Contact Information     Name Relation Home Work Mobile   [place] Sister [phone]     Shreyaan, Grzybowski [phone]     Jamey Reas   [phone]   Sylus, Chiaro Niece   [phone]      Chief Complaint: elevated Creatinine  Assessment and Plan: Rick Schwartz is a 82 y.o. male presenting with elevated creatinine. Differential for this patient's presentation of this includes chronic kidney disease, versus acute kidney injury (pre/intrinsic/post). It is difficulty to ascertain chronicity of this patient's renal function as patient has not had creatinine measured since November of 2022. Will evaluate for acute causes, although I suspect this is chronic in the setting of hypertension and HFrEF. Home Lasix may also be contributing.  * AKI (acute kidney injury) ([place]) Creatinine 2.01 in ED, down from 2.62 checked at PCP office 3/25. Creatinine 0.65 on 02/02/21, unclear baseline. Potassium 2.6, was given total of 60 mEQ repletion (20 IV, 40 PO). Possible over-diuresis given patient daily regimen of Lasix. Will hold Lasix and monitor. Previous UA 06/11/22 normal. Wonder if new baseline Cr is close to 2.  -Admit to [facility] Medicine Teaching Service, Dr. Ardelia Mems, med-surg -Recheck potassium @2000 . -AM BMP -Caution with fluid resuscitation due to HFrEF -Renal US -Replete potassium as need -Hold home Lasix 38meq and Losartan 25mg  daily  Chronic HFrEF (heart failure with reduced ejection fraction) (HCC) Last ECHO EF <20% in 2022. 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 male presenting by instructions from PCP for elevated creatinine.  Patient has history of cerebral palsy and is limited verbally. Difficulty to understand; however, patient has no complaints. States he has been urinating normally, and eating and drinking a normal amount.   Collateral obtained via Lynnell Chad, patient's sister. She corroborates above story. Patient mental status is normal. Occasionally skips meals, but is staying hydrated.   In the ED, patient's vital signs were stable outside of mild tachypnea. Lab work was significant for K+ 2.6, AG 17, Creatinine 2.01, BNP 63. CXR showed trace right sided pleural effusion. Patient was given x2 53meq IV potassium, and 33meq potassium oral, and NS 0.5L bolus.  Review Of Systems: Per HPI.  Pertinent Past Medical History: Cerebral palsy HLD HTN PAD Prostate cancer PE  Remainder reviewed in history tab.   Pertinent Past Surgical History: S/p right AKA  Prostate surgery Remainder reviewed in history tab.   Pertinent Social History: Tobacco use: Former, 8.75 pk-years Alcohol use: occasional Other Substance use: none Lives with: sister  Pertinent Family History: Reviewed.  Important Outpatient Medications: Tylenol Eliquis 5mg  BID Lasix 20mg  tablet Losartan 25mg  daily Micro K 46meq daily Crestor 20mg  Remainder reviewed in medication history.   Objective: BP (!) 144/86   Pulse 89   Temp 98.2 F (36.8 C) (Oral)   Resp (!) 23   SpO2 100%  Exam: General: A&O, NAD, lying comfortably in hospital bed, thin  HEENT: Lipoma right temporal area, No sign of trauma, EOM grossly intact, moist mucous membranes Cardiac: RRR, no m/r/g Respiratory: CTAB, normal WOB, no w/c/r GI:  Soft, NTTP, non-distended, no rebound or guarding Extremities: right arm contracted, NTTP, no peripheral edema, s/p right AKA Neuro: Moves all four extremities appropriately. Psych: Appropriate mood and affect  Labs:  CBC BMET  Recent Labs  Lab 06/13/22 1125  WBC 7.8  HGB 15.5  HCT 45.4  PLT 320   Recent Labs  Lab 06/13/22 1125  NA 136  K 2.6*  CL 91*  CO2 28  BUN 28*  CREATININE 2.01*  GLUCOSE 114*  CALCIUM 8.1*    Pertinent additional labs:  BNP 63.  EKG: Personal read: sinus rhythm, PVCs present   Imaging Studies Performed:  CXR IMPRESSION: Low lung volumes with a trace right pleural effusion  My Interpretation: No signs of vascular congestion.   Salvadore Oxford, MD 06/13/2022, 2:57 PM PGY-1, [place] Intern pager: [phone], text pages welcome Secure chat group [facility] Teaching Service   Upper Level Addendum: I have seen and evaluated this patient along with Dr. Ruben Im and reviewed the above note, making necessary revisions as appropriate. I agree with the medical decision making and physical exam as noted above. Ezequiel Essex, MD PGY-3 [facility]

## 2022-06-13 NOTE — Progress Notes (Addendum)
FMTS Brief Progress Note  S:Went bedside to see patient w/ Dr. Jerilee Hoh. Patient lying in bed, no complaints. Food at bedside, patient appeared to state he will need help w/ eating.    O: BP 107/63 (BP Location: Left Arm)   Pulse 81   Temp 97.6 F (36.4 C) (Oral)   Resp 17   SpO2 97%   General: NAD, hard to understand likely 2/2 to needing dentures, poor enunciation Resp: Normal WOB Ext: RUE contracted, RLE AKA  A/P: AKI Patient presents for evaluation of AKI, unclear etiology at this time.  -Plans per day team  Assist w/ feeding Patient w/ cerebral palsy and noted contracture of right upper extremity. Patient will need help with feeds, and was requesting at bedside  -Assist w/ feeds   Hypokalemia K on admission 2.6, patient was repleted. Evening BMP w/ K of 3.0, will replete. -40 meq PO, 20 meq IV -AM CMP - Orders reviewed. Labs for AM ordered, which was adjusted as needed.   Holley Bouche, MD 06/13/2022, 9:50 PM PGY-2, [facility] Night Resident  Please page [phone] with questions.

## 2022-06-13 NOTE — ED Provider Notes (Signed)
[facility] Provider Note   CSN: [id number] Arrival date & time: 06/13/22  1056     History  Chief Complaint  Patient presents with   Abnormal Lab    Rick Schwartz is a 82 y.o. male.  Patient is a 82 year old male who presents with an abnormal lab value.  He recently was seen at his PCPs office and noted to have an elevated creatinine and was sent here for further evaluation.  On chart review, he has a history of cerebral palsy, hypertension, hyperlipidemia, peripheral arterial disease, CHF.  He is on Eliquis.  He and his sister who are at bedside says he has not noted any significant changes recently.  No shortness of breath.  No chest pain.  The sister did note that he had a period yesterday where he was not urinating as frequently as he normally does.  No fevers.  No change in mental status.       Home Medications Prior to Admission medications   Medication Sig Start Date End Date Taking? Authorizing Provider  acetaminophen (TYLENOL) 325 MG tablet Take 2 tablets (650 mg total) by mouth every 6 (six) hours. Patient not taking: Reported on 12/21/2021 09/26/20   Ezequiel Essex, MD  apixaban (ELIQUIS) 5 MG TABS tablet TAKE 1 TABLET(5 MG) BY MOUTH TWICE DAILY 03/05/22   Erskine Emery, MD  furosemide (LASIX) 20 MG tablet TAKE 1 TABLET(20 MG) BY MOUTH DAILY 05/17/22   August Albino, MD  guaiFENesin (ROBITUSSIN) 100 MG/5ML liquid Take 100 mg by mouth daily as needed for cough or to loosen phlegm. Patient not taking: Reported on 12/21/2021    [provider]  losartan (COZAAR) 25 MG tablet TAKE 1 TABLET(25 MG) BY MOUTH DAILY 02/23/22   August Albino, MD  potassium chloride (MICRO-K) 10 MEQ CR capsule TAKE 1 CAPSULE(10 MEQ) BY MOUTH DAILY 06/04/22   August Albino, MD  rosuvastatin (CRESTOR) 20 MG tablet TAKE 1 TABLET(20 MG) BY MOUTH DAILY 06/04/22   August Albino, MD      Allergies    Patient has no known allergies.    Review of  Systems   Review of Systems  Constitutional:  Negative for chills, diaphoresis, fatigue and fever.  HENT:  Negative for congestion, rhinorrhea and sneezing.   Eyes: Negative.   Respiratory:  Negative for cough, chest tightness and shortness of breath.   Cardiovascular:  Negative for chest pain and leg swelling.  Gastrointestinal:  Negative for abdominal pain, blood in stool, diarrhea, nausea and vomiting.  Genitourinary:  Positive for decreased urine volume. Negative for difficulty urinating, flank pain, frequency and hematuria.  Musculoskeletal:  Negative for arthralgias and back pain.  Skin:  Negative for rash.  Neurological:  Negative for dizziness, speech difficulty, weakness, numbness and headaches.    Physical Exam Updated Vital Signs BP (!) 144/86   Pulse 89   Temp 98.2 F (36.8 C) (Oral)   Resp (!) 23   SpO2 100%  Physical Exam Constitutional:      Appearance: He is well-developed.  HENT:     Head: Normocephalic and atraumatic.  Eyes:     Pupils: Pupils are equal, round, and reactive to light.  Cardiovascular:     Rate and Rhythm: Normal rate and regular rhythm.     Heart sounds: Normal heart sounds.  Pulmonary:     Effort: Pulmonary effort is normal. No respiratory distress.     Breath sounds: Normal breath sounds. No wheezing or rales.  Chest:     Chest wall: No tenderness.  Abdominal:     General: Bowel sounds are normal.     Palpations: Abdomen is soft.     Tenderness: There is no abdominal tenderness. There is no guarding or rebound.  Musculoskeletal:        General: Normal range of motion.     Cervical back: Normal range of motion and neck supple.     Comments: Contractures to the right upper extremity, right leg is missing below the hip  Lymphadenopathy:     Cervical: No cervical adenopathy.  Skin:    General: Skin is warm and dry.     Findings: No rash.  Neurological:     Mental Status: He is alert and oriented to person, place, and time.     ED  Results / Procedures / Treatments   Labs (all labs ordered are listed, but only abnormal results are displayed) Labs Reviewed  BASIC METABOLIC PANEL - Abnormal; Notable for the following components:      Result Value   Potassium 2.6 (*)    Chloride 91 (*)    Glucose, Bld 114 (*)    BUN 28 (*)    Creatinine, Ser 2.01 (*)    Calcium 8.1 (*)    GFR, Estimated 33 (*)    Anion gap 17 (*)    All other components within normal limits  CBC WITH DIFFERENTIAL/PLATELET  BRAIN NATRIURETIC PEPTIDE  MAGNESIUM    EKG EKG Interpretation  Date/Time:  Wednesday June 13 2022 11:24:27 EDT Ventricular Rate:  95 PR Interval:    QRS Duration: 82 QT Interval:  389 QTC Calculation: 466 R Axis:   21 Text Interpretation: Sinus rhythm  PVCs, some coupled beats Aberrant conduction of SV complex(es) Low voltage, extremity and precordial leads Anteroseptal infarct, old Borderline repolarization abnormality Confirmed by Malvin Johns [phone]) on 06/13/2022 11:32:50 AM  Radiology DG Chest Port 1 View  Result Date: 06/13/2022 CLINICAL DATA:  Shortness of breath EXAM: PORTABLE CHEST 1 VIEW COMPARISON:  CXR 01/20/21 FINDINGS: Low lung volumes. No pleural effusion. No pneumothorax. Linear opacity in the right mid lung field is favored to represent atelectasis or small pleural fluid. Prominent bilateral interstitial opacities are favored to represent bronchovascular crowding in the setting of low lung volumes. Likely unchanged cardiac and mediastinal contours when accounting for differences in lung volumes. Radiographically apparent displaced rib fractures. IMPRESSION: Low lung volumes with a trace right pleural effusion Electronically Signed   By: Marin Roberts M.D.   On: 06/13/2022 11:55    Procedures Procedures    Medications Ordered in ED Medications  potassium chloride 10 mEq in 100 mL IVPB (10 mEq Intravenous New Bag/Given 06/13/22 1325)  rosuvastatin (CRESTOR) tablet 10 mg (has no administration in time  range)  apixaban (ELIQUIS) tablet 5 mg (has no administration in time range)  acetaminophen (TYLENOL) tablet 650 mg (has no administration in time range)  sodium chloride 0.9 % bolus 500 mL (500 mLs Intravenous New Bag/Given 06/13/22 1222)  potassium chloride (KLOR-CON) packet 40 mEq (40 mEq Oral Given 06/13/22 1331)    ED Course/ Medical Decision Making/ A&P                             Medical Decision Making Amount and/or Complexity of Data Reviewed Labs: ordered. Radiology: ordered.  Risk Prescription drug management. Decision regarding hospitalization.   Patient is a 82 year old who presents after having some abnormal outpatient  blood work indicated an AKI.  He is relatively asymptomatic other than a little bit of decreased urination.  He is afebrile.  His lungs are clear on exam.  Chest x-ray two-view was interpreted by me and confirmed by the radiologist to show no acute abnormality.  No evidence of pneumonia or pulmonary edema.  EKG does not show any ischemic changes.  His creatinine is 2.01 which is a bit higher than his prior value.  Chart was reviewed.  His calcium is markedly low at 2.6.  He was started on IV and oral potassium replacement.  Magnesium level is pending.  I spoke with the family medicine resident who will admit the patient for further treatment.  CRITICAL CARE Performed by: Malvin Johns Total critical care time: 50 minutes Critical care time was exclusive of separately billable procedures and treating other patients. Critical care was necessary to treat or prevent imminent or life-threatening deterioration. Critical care was time spent personally by me on the following activities: development of treatment plan with patient and/or surrogate as well as nursing, discussions with consultants, evaluation of patient's response to treatment, examination of patient, obtaining history from patient or surrogate, ordering and performing treatments and interventions, ordering  and review of laboratory studies, ordering and review of radiographic studies, pulse oximetry and re-evaluation of patient's condition.   Final Clinical Impression(s) / ED Diagnoses Final diagnoses:  Hypokalemia  AKI (acute kidney injury) [facility])    Rx / DC Orders ED Discharge Orders     None         Malvin Johns, MD 06/13/22 1410

## 2022-06-13 NOTE — ED Notes (Addendum)
ED TO INPATIENT HANDOFF REPORT  ED Nurse Name and Phone #: Effie Shy Name/Age/Gender Rick Schwartz 82 y.o. male Room/Bed: 030C/030C  Code Status   Code Status: DNR  Home/SNF/Other Home Patient oriented to: self, place, time, and situation Is this baseline? Yes   Triage Complete: Triage complete  Chief Complaint AKI (acute kidney injury) ([place]) [N17.9]  Triage Note Pt arrives POV coming from home. Had labs drawn on Monday and received a call yesterday that is kidney function was abnormal. Pt denies complaints at this time.    Allergies No Known Allergies  Level of Care/Admitting Diagnosis ED Disposition     ED Disposition  Admit   Condition  --   Comment  Hospital Area: [place] [[id number]]  Level of Care: Med-Surg [16]  May place patient in observation at [facility] or [place] if equivalent level of care is available:: No  Covid Evaluation: Asymptomatic - no recent exposure (last 10 days) testing not required  Diagnosis: AKI (acute kidney injury) [facility]) [id number]  Admitting Physician: Salvadore Oxford [id number]  Attending Physician: Leeanne Rio [[id number]          B Medical/Surgery History Past Medical History:  Diagnosis Date   Cancer ([place])    Prostate   Cerebral palsy ([place])    Hyperlipidemia    Hypertension    Peripheral arterial disease ([place])    critical limb ischemia   Past Surgical History:  Procedure Laterality Date   AMPUTATION Right 07/22/2020   Procedure: RIGHT ABOVE KNEE AMPUTATION;  Surgeon: [name], MD;  Location: [place];  Service: Orthopedics;  Laterality: Right;   PROSTATE SURGERY       A IV Location/Drains/Wounds Patient Lines/Drains/Airways Status     Active Line/Drains/Airways     Name Placement date Placement time Site Days   Peripheral IV 01/20/21 20 G Left;Posterior Forearm 01/20/21  1346  Forearm  509   External Urinary Catheter 01/20/21  1410  --  509   Pressure Injury 09/21/20 Heel Left  Stage 3 -  Full thickness tissue loss. Subcutaneous fat may be visible but bone, tendon or muscle are NOT exposed. Red, pale with scar tissue 09/21/20  1000  -- 630   Pressure Injury 01/23/21 Scrotum Lower Stage 2 -  Partial thickness loss of dermis presenting as a shallow open injury with a red, pink wound bed without slough. Two areas of Stage 2 01/23/21  0500  -- 506            Intake/Output Last 24 hours No intake or output data in the 24 hours ending 06/13/22 1410  Labs/Imaging Results for orders placed or performed during the hospital encounter of 06/13/22 (from the past 48 hour(s))  Brain natriuretic peptide     Status: None   Collection Time: 06/13/22 11:11 AM  Result Value Ref Range   B Natriuretic Peptide 63.0 0.0 - 100.0 pg/mL    Comment: Performed at [facility], [address], [place], [place] [id number]  Basic metabolic panel     Status: Abnormal   Collection Time: 06/13/22 11:25 AM  Result Value Ref Range   Sodium 136 135 - 145 mmol/L   Potassium 2.6 (LL) 3.5 - 5.1 mmol/L    Comment: CRITICAL RESULT CALLED TO, READ BACK BY AND VERIFIED WITH M. Nolton Denis, RN AT 1249 03.27.24 D. BLU   Chloride 91 (L) 98 - 111 mmol/L   CO2 28 22 - 32 mmol/L   Glucose, Bld  114 (H) 70 - 99 mg/dL    Comment: Glucose reference range applies only to samples taken after fasting for at least 8 hours.   BUN 28 (H) 8 - 23 mg/dL   Creatinine, Ser 2.01 (H) 0.61 - 1.24 mg/dL   Calcium 8.1 (L) 8.9 - 10.3 mg/dL   GFR, Estimated 33 (L) >60 mL/min    Comment: (NOTE) Calculated using the CKD-EPI Creatinine Equation (2021)    Anion gap 17 (H) 5 - 15    Comment: Performed at [facility] [address], [place], [place] [address]  CBC with Differential     Status: None   Collection Time: 06/13/22 11:25 AM  Result Value Ref Range   WBC 7.8 4.0 - 10.5 K/uL   RBC 5.23 4.22 - 5.81 MIL/uL   Hemoglobin 15.5 13.0 - 17.0 g/dL   HCT 45.4 39.0 - 52.0 %   MCV 86.8 80.0 - 100.0 fL   MCH 29.6  26.0 - 34.0 pg   MCHC 34.1 30.0 - 36.0 g/dL   RDW 13.0 11.5 - 15.5 %   Platelets 320 150 - 400 K/uL   nRBC 0.0 0.0 - 0.2 %   Neutrophils Relative % 69 %   Neutro Abs 5.3 1.7 - 7.7 K/uL   Lymphocytes Relative 18 %   Lymphs Abs 1.4 0.7 - 4.0 K/uL   Monocytes Relative 11 %   Monocytes Absolute 0.9 0.1 - 1.0 K/uL   Eosinophils Relative 1 %   Eosinophils Absolute 0.1 0.0 - 0.5 K/uL   Basophils Relative 1 %   Basophils Absolute 0.1 0.0 - 0.1 K/uL   Immature Granulocytes 0 %   Abs Immature Granulocytes 0.03 0.00 - 0.07 K/uL    Comment: Performed at [facility], [address], [place], [place] [address]   DG Chest Port 1 View  Result Date: 06/13/2022 CLINICAL DATA:  Shortness of breath EXAM: PORTABLE CHEST 1 VIEW COMPARISON:  CXR 01/20/21 FINDINGS: Low lung volumes. No pleural effusion. No pneumothorax. Linear opacity in the right mid lung field is favored to represent atelectasis or small pleural fluid. Prominent bilateral interstitial opacities are favored to represent bronchovascular crowding in the setting of low lung volumes. Likely unchanged cardiac and mediastinal contours when accounting for differences in lung volumes. Radiographically apparent displaced rib fractures. IMPRESSION: Low lung volumes with a trace right pleural effusion Electronically Signed   By: Marin Roberts M.D.   On: 06/13/2022 11:55    Pending Labs Unresulted Labs (From admission, onward)     Start     Ordered   06/14/22 [id number]  Basic metabolic panel  Tomorrow morning,   R        06/13/22 1408   06/13/22 2000  Potassium  Once-Timed,   TIMED        06/13/22 1408   06/13/22 1311  Magnesium  Add-on,   AD        06/13/22 1311            Vitals/Pain Today's Vitals   06/13/22 1245 06/13/22 1300 06/13/22 1315 06/13/22 1400  BP: 117/64 (!) 107/54 111/77 (!) 144/86  Pulse: 80  89   Resp: (!) 27  (!) 23 (!) 23  Temp:      TempSrc:      SpO2: 99%  100%   PainSc:        Isolation Precautions No active  isolations  Medications Medications  potassium chloride 10 mEq in 100 mL IVPB (10 mEq  Intravenous New Bag/Given 06/13/22 1325)  rosuvastatin (CRESTOR) tablet 10 mg (has no administration in time range)  apixaban (ELIQUIS) tablet 5 mg (has no administration in time range)  acetaminophen (TYLENOL) tablet 650 mg (has no administration in time range)  sodium chloride 0.9 % bolus 500 mL (500 mLs Intravenous New Bag/Given 06/13/22 1222)  potassium chloride (KLOR-CON) packet 40 mEq (40 mEq Oral Given 06/13/22 1331)    Mobility non-ambulatory     Focused Assessments    R Recommendations: See Admitting Provider Note  Report given to:   Additional Notes:  hx CP with limited mobility; crush large pills

## 2022-06-14 ENCOUNTER — Observation Stay (HOSPITAL_BASED_OUTPATIENT_CLINIC_OR_DEPARTMENT_OTHER): Payer: 59

## 2022-06-14 DIAGNOSIS — N179 Acute kidney failure, unspecified: Secondary | ICD-10-CM | POA: Diagnosis not present

## 2022-06-14 DIAGNOSIS — I5022 Chronic systolic (congestive) heart failure: Secondary | ICD-10-CM | POA: Diagnosis not present

## 2022-06-14 LAB — ECHOCARDIOGRAM COMPLETE
AR max vel: 2.69 cm2
AV Area VTI: 2.8 cm2
AV Area mean vel: 2.62 cm2
AV Mean grad: 2.7 mmHg
AV Peak grad: 5.6 mmHg
Ao pk vel: 1.18 m/s
Area-P 1/2: 3.24 cm2
Calc EF: 42.5 %
S' Lateral: 3.7 cm
Single Plane A2C EF: 43.5 %
Single Plane A4C EF: 42.3 %

## 2022-06-14 LAB — COMPREHENSIVE METABOLIC PANEL
ALT: 12 U/L (ref 0–44)
AST: 21 U/L (ref 15–41)
Albumin: 2.7 g/dL — ABNORMAL LOW (ref 3.5–5.0)
Alkaline Phosphatase: 51 U/L (ref 38–126)
Anion gap: 11 (ref 5–15)
BUN: 21 mg/dL (ref 8–23)
CO2: 29 mmol/L (ref 22–32)
Calcium: 8.5 mg/dL — ABNORMAL LOW (ref 8.9–10.3)
Chloride: 99 mmol/L (ref 98–111)
Creatinine, Ser: 1.49 mg/dL — ABNORMAL HIGH (ref 0.61–1.24)
GFR, Estimated: 47 mL/min — ABNORMAL LOW (ref >60)
Glucose, Bld: 100 mg/dL — ABNORMAL HIGH (ref 70–99)
Potassium: 3.3 mmol/L — ABNORMAL LOW (ref 3.5–5.1)
Sodium: 139 mmol/L (ref 135–145)
Total Bilirubin: 1.1 mg/dL (ref 0.3–1.2)
Total Protein: 6.4 g/dL — ABNORMAL LOW (ref 6.5–8.1)

## 2022-06-14 LAB — MAGNESIUM: Magnesium: 2.2 mg/dL (ref 1.7–2.4)

## 2022-06-14 MED ORDER — POTASSIUM CHLORIDE 20 MEQ PO PACK
60.0000 meq | PACK | Freq: Once | ORAL | Status: AC
  Administered 2022-06-14: 60 meq via ORAL
  Filled 2022-06-14: qty 3

## 2022-06-14 MED ORDER — PERFLUTREN LIPID MICROSPHERE
1.0000 mL | INTRAVENOUS | Status: AC | PRN
  Administered 2022-06-14: 2 mL via INTRAVENOUS

## 2022-06-14 NOTE — Discharge Summary (Addendum)
[facility] Discharge Summary  Patient name: Rick Schwartz Medical record number: [id number] Date of birth: February 24, 1941 Age: 82 y.o. Gender: male Date of Admission: 06/13/2022  Date of Discharge: 06/14/22 Admitting Physician: Salvadore Oxford, MD  Primary Care Provider: August Albino, MD Consultants: none  Indication for Hospitalization: acute kidney injury  Brief Hospital Course:  Rick Schwartz is a 82 y.o.male with a history of spastic cerebral palsy, hyperlipidemia, hypertension, CAD, prostate cancer, dementia, HFrEF who was admitted to the [facility] Medicine Teaching Service at [facility] for elevated creatinine and hypokalemia. His hospital course is detailed below:  AKI Hypokalemia Sent from PCP for Cr 2.62, thought to be secondary to medications. Repeat Cr 2.01 upon admission and hypokalemic to 2.6. Potassium repleted and trended during admission. Renal US normal and home Lasix and Losartan were held. Cr improved to 1.49 at time of discharge.  Chronic HFpEF Most recent echo in 2022 showed <20%. Repeat echo 3/28 showed LVEF 45-50%, G1DD.  Other chronic conditions were medically managed with home medications and formulary alternatives as necessary (h/o PE, HLD, HTN)  PCP Follow-up Recommendations:  BMP - recheck Cr and K Recheck blood pressure, Losartan held at discharge due to soft pressures.   Discharge Diagnoses/Problem List:  * AKI (acute kidney injury) ([place]) Chronic HFrEF (heart failure with reduced ejection fraction) ([place])  Disposition: Home  Discharge Condition: Stable  Discharge Exam:  General: alert, in NAD, chronically ill-appearing Cardiovascular: RRR. No m/r/g. Respiratory: CTAB. Normal WOB on RA.  Abdomen: soft, non-tender, non-distended  Extremities: RUE contracted, RLE AKA. At least 3/5 strength in LUE and LLE.   Significant Procedures: None  Significant Labs and Imaging:  Recent Labs  Lab 06/13/22 1125  WBC 7.8  HGB 15.5   HCT 45.4  PLT 320   Recent Labs  Lab 06/13/22 1125 06/13/22 1948 06/14/22 0822  NA 136  --  139  K 2.6* 3.0* 3.3*  CL 91*  --  99  CO2 28  --  29  GLUCOSE 114*  --  100*  BUN 28*  --  21  CREATININE 2.01*  --  1.49*  CALCIUM 8.1*  --  8.5*  MG 1.2*  --  2.2  ALKPHOS  --   --  51  AST  --   --  21  ALT  --   --  12  ALBUMIN  --   --  2.7*   ECHOCARDIOGRAM COMPLETE  Result Date: 06/14/2022    ECHOCARDIOGRAM REPORT   Patient Name:   Rick Schwartz Date of Exam: 06/14/2022 Medical Rec #:  [id number]      Height:       67.0 in Accession #:    [id number]     Weight:       119.0 lb Date of Birth:  [birth year]      BSA:          1.622 m Patient Age:    11 years       BP:           115/57 mmHg Patient Gender: M              HR:           83 bpm. Exam Location:  Inpatient Procedure: 2D Echo, Cardiac Doppler, Color Doppler and Intracardiac            Opacification Agent Indications:    CHF  History:        Patient has  prior history of Echocardiogram examinations, most                 recent 09/21/2020. PAD, Arrythmias:Bradycardia; Risk                 Factors:Hypertension and Dyslipidemia. Cerebral palsy, cancer.  Sonographer:    Eartha Inch Referring Phys: 22 MELANIE BELFI  Sonographer Comments: Technically difficult study due to poor echo windows and no subcostal window. Image acquisition challenging due to patient body habitus and Image acquisition challenging due to respiratory motion. IMPRESSIONS  1. Left ventricular ejection fraction, by estimation, is 45 to 50%. The left ventricle has mildly decreased function. The left ventricle has no regional wall motion abnormalities. Left ventricular diastolic parameters are consistent with Grade I diastolic dysfunction (impaired relaxation).  2. Right ventricular systolic function is normal. The right ventricular size is normal. Tricuspid regurgitation signal is inadequate for assessing PA pressure.  3. The mitral valve is normal in structure. No  evidence of mitral valve regurgitation. No evidence of mitral stenosis.  4. The aortic valve is tricuspid. There is mild calcification of the aortic valve. There is mild thickening of the aortic valve. Aortic valve regurgitation is not visualized. No aortic stenosis is present. FINDINGS  Left Ventricle: Left ventricular ejection fraction, by estimation, is 45 to 50%. The left ventricle has mildly decreased function. The left ventricle has no regional wall motion abnormalities. Definity contrast agent was given IV to delineate the left ventricular endocardial borders. The left ventricular internal cavity size was normal in size. There is no left ventricular hypertrophy. Left ventricular diastolic parameters are consistent with Grade I diastolic dysfunction (impaired relaxation). Normal  left ventricular filling pressure. Right Ventricle: The right ventricular size is normal. Right vetricular wall thickness was not well visualized. Right ventricular systolic function is normal. Tricuspid regurgitation signal is inadequate for assessing PA pressure. Left Atrium: Left atrial size was normal in size. Right Atrium: Right atrial size was normal in size. Pericardium: There is no evidence of pericardial effusion. Mitral Valve: The mitral valve is normal in structure. No evidence of mitral valve regurgitation. No evidence of mitral valve stenosis. Tricuspid Valve: The tricuspid valve is normal in structure. Tricuspid valve regurgitation is not demonstrated. No evidence of tricuspid stenosis. Aortic Valve: The aortic valve is tricuspid. There is mild calcification of the aortic valve. There is mild thickening of the aortic valve. There is mild aortic valve annular calcification. Aortic valve regurgitation is not visualized. No aortic stenosis  is present. Aortic valve mean gradient measures 2.7 mmHg. Aortic valve peak gradient measures 5.6 mmHg. Aortic valve area, by VTI measures 2.80 cm. Pulmonic Valve: The pulmonic valve  was not well visualized. Pulmonic valve regurgitation is not visualized. No evidence of pulmonic stenosis. Aorta: The aortic root is normal in size and structure. Venous: The inferior vena cava was not well visualized. IAS/Shunts: The interatrial septum was not well visualized.  LEFT VENTRICLE PLAX 2D LVIDd:         4.20 cm     Diastology LVIDs:         3.70 cm     LV e' medial:    4.79 cm/s LV PW:         0.90 cm     LV E/e' medial:  11.8 LV IVS:        0.60 cm     LV e' lateral:   5.87 cm/s LVOT diam:     2.00 cm     LV  E/e' lateral: 9.6 LV SV:         49 LV SV Index:   30 LVOT Area:     3.14 cm  LV Volumes (MOD) LV vol d, MOD A2C: 53.8 ml LV vol d, MOD A4C: 62.4 ml LV vol s, MOD A2C: 30.4 ml LV vol s, MOD A4C: 36.0 ml LV SV MOD A2C:     23.4 ml LV SV MOD A4C:     62.4 ml LV SV MOD BP:      24.6 ml RIGHT VENTRICLE RV S prime:     10.60 cm/s LEFT ATRIUM             Index        RIGHT ATRIUM          Index LA diam:        3.40 cm 2.10 cm/m   RA Area:     8.02 cm LA Vol (A2C):   27.4 ml 16.89 ml/m  RA Volume:   14.00 ml 8.63 ml/m LA Vol (A4C):   36.6 ml 22.56 ml/m LA Biplane Vol: 31.5 ml 19.42 ml/m  AORTIC VALVE AV Area (Vmax):    2.69 cm AV Area (Vmean):   2.62 cm AV Area (VTI):     2.80 cm AV Vmax:           118.07 cm/s AV Vmean:          78.110 cm/s AV VTI:            0.175 m AV Peak Grad:      5.6 mmHg AV Mean Grad:      2.7 mmHg LVOT Vmax:         101.00 cm/s LVOT Vmean:        65.200 cm/s LVOT VTI:          0.156 m LVOT/AV VTI ratio: 0.89  AORTA Ao Root diam: 2.70 cm MITRAL VALVE MV Area (PHT): 3.24 cm    SHUNTS MV Decel Time: 234 msec    Systemic VTI:  0.16 m MV E velocity: 56.60 cm/s  Systemic Diam: 2.00 cm MV A velocity: 73.90 cm/s MV E/A ratio:  0.77 Carlyle Dolly MD Electronically signed by Carlyle Dolly MD Signature Date/Time: 06/14/2022/3:21:59 PM    Final    US RENAL  Result Date: 06/13/2022 CLINICAL DATA:  Acute kidney injury. EXAM: RENAL / URINARY TRACT ULTRASOUND COMPLETE  COMPARISON:  None Available. FINDINGS: Right Kidney: Renal measurements: 9.3 x 5.4 x 4.7 cm = volume: 121.9 mL. Echogenicity within normal limits. No mass or hydronephrosis visualized. Left Kidney: Renal measurements: 9.9 x 5.6 x 5.2 cm = volume: 150.4 mL. Echogenicity within normal limits. No mass or hydronephrosis visualized. Bladder: Appears normal for degree of bladder distention. Other: None. IMPRESSION: Normal renal sonogram. Electronically Signed   By: Kerby Moors M.D.   On: 06/13/2022 15:33   DG Chest Port 1 View  Result Date: 06/13/2022 CLINICAL DATA:  Shortness of breath EXAM: PORTABLE CHEST 1 VIEW COMPARISON:  CXR 01/20/21 FINDINGS: Low lung volumes. No pleural effusion. No pneumothorax. Linear opacity in the right mid lung field is favored to represent atelectasis or small pleural fluid. Prominent bilateral interstitial opacities are favored to represent bronchovascular crowding in the setting of low lung volumes. Likely unchanged cardiac and mediastinal contours when accounting for differences in lung volumes. Radiographically apparent displaced rib fractures. IMPRESSION: Low lung volumes with a trace right pleural effusion Electronically Signed   By: Marin Olp.D.  On: 06/13/2022 11:55     Results/Tests Pending at Time of Discharge: None  Discharge Medications:  Allergies as of 06/14/2022   No Known Allergies      Medication List     STOP taking these medications    guaiFENesin 100 MG/5ML liquid Commonly known as: ROBITUSSIN   losartan 25 MG tablet Commonly known as: COZAAR       TAKE these medications    acetaminophen 325 MG tablet Commonly known as: TYLENOL Take 2 tablets (650 mg total) by mouth every 6 (six) hours.   apixaban 5 MG Tabs tablet Commonly known as: Eliquis TAKE 1 TABLET(5 MG) BY MOUTH TWICE DAILY What changed:  how much to take how to take this when to take this additional instructions   furosemide 20 MG tablet Commonly known as:  LASIX TAKE 1 TABLET(20 MG) BY MOUTH DAILY What changed:  how much to take how to take this when to take this additional instructions   potassium chloride 10 MEQ CR capsule Commonly known as: MICRO-K TAKE 1 CAPSULE(10 MEQ) BY MOUTH DAILY What changed:  how much to take how to take this when to take this additional instructions   rosuvastatin 20 MG tablet Commonly known as: CRESTOR TAKE 1 TABLET(20 MG) BY MOUTH DAILY What changed:  how much to take how to take this when to take this additional instructions        Discharge Instructions: Please refer to Patient Instructions section of EMR for full details.  Patient was counseled important signs and symptoms that should prompt return to medical care, changes in medications, dietary instructions, activity restrictions, and follow up appointments.   Follow-Up Appointments:  Follow-up Information     August Albino, MD. Schedule an appointment as soon as possible for a visit.   Specialty: Family Medicine Why: Please schedule an appointment with the clinic in the next 7-10 days for a hospital follow up. Contact information: [address] [place] [address] [phone]         [facility] Follow up.   Why: Please call the Home health agency to restart home health services.               Rolanda Lundborg, MD 06/14/2022, 3:28 PM PGY-1, [place]

## 2022-06-14 NOTE — Care Management Obs Status (Cosign Needed)
[facility] NOTIFICATION   Patient Details  Name: Rick Schwartz MRN: [id number] Date of Birth: [birth year]   Medicare Observation Status Notification Given:  Yes    Curlene Labrum, RN 06/14/2022, 1:59 PM

## 2022-06-14 NOTE — TOC Progression Note (Signed)
Transition of Care [facility]) - Progression Note    Patient Details  Name: Rick Schwartz MRN: [id number] Date of Birth: 1940-06-04  Transition of Care [facility]) CM/SW [place], RN Phone Number: 06/14/2022, 3:05 PM  Clinical Narrative:    CM met with the patient at the bedside and provide with Medicare observation letter.  The patient was admitted for Acute pre-renal Kidney injury.  The patient lives at home with his sister, Mardene Celeste, who provides 24 hour care at the home.  The patient will return home by car with the sister when he is medically stable for discharge.  The patient has personal wheelchair in the hospital room and hoyer lift at the home.  The patient's sister declined home health but states that patient has active personal care services through [facility] 75 hours/month.  The patient's sister states that patient is on the CAP waiting list for evaluation at this time for increased hours.  The patient's sister will provide transportation home by car when the patient is medically stable for discharge to home.   Expected Discharge Plan: Home/Self Care Barriers to Discharge: Continued Medical Work up  Expected Discharge Plan and Services   Discharge Planning Services: CM Consult Post Acute Care Choice: Resumption of Svcs/PTA Provider Living arrangements for the past 2 months: Single Family Home                                       Social Determinants of Health (SDOH) Interventions SDOH Screenings   Food Insecurity: No Food Insecurity (02/15/2021)  Housing: Low Risk  (02/15/2021)  Transportation Needs: No Transportation Needs (02/15/2021)  Depression (PHQ2-9): Low Risk  (06/04/2022)  Financial Resource Strain: Low Risk  (02/15/2021)  Tobacco Use: Medium Risk (06/13/2022)    Readmission Risk Interventions     No data to display

## 2022-06-14 NOTE — Hospital Course (Addendum)
Rick Schwartz is a 82 y.o.male with a history of spastic cerebral palsy, hyperlipidemia, hypertension, CAD, prostate cancer, dementia, HFrEF who was admitted to the [facility] Medicine Teaching Service at [facility] for elevated creatinine and hypokalemia. His hospital course is detailed below:  AKI Hypokalemia Sent from PCP for Cr 2.62, thought to be secondary to medications. Repeat Cr 2.01 upon admission and hypokalemic to 2.6. Potassium repleted and trended during admission. Renal US normal and home Lasix and Losartan were held. Cr improved to 1.49 at time of discharge.  Chronic HFpEF Most recent echo in 2022 showed <20%. Repeat echo 3/28 showed LVEF 45-50%, G1DD.  Other chronic conditions were medically managed with home medications and formulary alternatives as necessary (h/o PE, HLD, HTN)  PCP Follow-up Recommendations:  BMP - recheck Cr and K Recheck blood pressure, Losartan held at discharge due to soft pressures.

## 2022-06-14 NOTE — Plan of Care (Signed)
Alert and oriented x3-4 this shift. Speech slurred at baseline. No new deficits noted this shift. Total care and feeing provided this shift. Patient took meds as ordered. Potassium replaced x2 bags, and 2 packets of mix powder. Had 1 BM overnight. Incontinent of bowel and bladder. Patient in bed sleeping, chest rise and fall noted at [id number].   Problem: Education: Goal: Knowledge of General Education information will improve Description: Including pain rating scale, medication(s)/side effects and non-pharmacologic comfort measures Outcome: Progressing   Problem: Health Behavior/Discharge Planning: Goal: Ability to manage health-related needs will improve Outcome: Progressing   Problem: Clinical Measurements: Goal: Ability to maintain clinical measurements within normal limits will improve Outcome: Progressing Goal: Will remain free from infection Outcome: Progressing Goal: Diagnostic test results will improve Outcome: Progressing Goal: Respiratory complications will improve Outcome: Progressing Goal: Cardiovascular complication will be avoided Outcome: Progressing   Problem: Activity: Goal: Risk for activity intolerance will decrease Outcome: Progressing   Problem: Nutrition: Goal: Adequate nutrition will be maintained Outcome: Progressing   Problem: Coping: Goal: Level of anxiety will decrease Outcome: Progressing   Problem: Elimination: Goal: Will not experience complications related to bowel motility Outcome: Progressing Goal: Will not experience complications related to urinary retention Outcome: Progressing   Problem: Pain Managment: Goal: General experience of comfort will improve Outcome: Progressing   Problem: Safety: Goal: Ability to remain free from injury will improve Outcome: Progressing   Problem: Skin Integrity: Goal: Risk for impaired skin integrity will decrease Outcome: Progressing

## 2022-06-14 NOTE — Progress Notes (Signed)
  Echocardiogram 2D Echocardiogram has been performed.  Rick Schwartz 06/14/2022, 11:55 AM

## 2022-06-14 NOTE — Discharge Instructions (Addendum)
Dear Rick Schwartz,  Thank you for letting us participate in your care. You were hospitalized for an AKI (acute kidney injury) ([place]). You were treated with fluids and your labs were monitored. Some of your medications were held, please review them carefully.   POST-HOSPITAL & CARE INSTRUCTIONS Stop taking your losartan Continue taking your Lasix and Potassium supplementation  Go to your follow up appointments (listed below)   DOCTOR'S APPOINTMENT   Future Appointments  Date Time Provider [place]  06/21/2022  2:15 PM August Albino, MD [facility]     Take care and be well!  [facility]  [place], [place] [address] [phone]

## 2022-06-14 NOTE — Progress Notes (Signed)
     Daily Progress Note Intern Pager: [phone]  Patient name: Rick Schwartz Medical record number: [id number] Date of birth: [birth year] Age: 82 y.o. Gender: male  Primary Care Provider: August Albino, MD Consultants: none Code Status: DNR, Discussed with patient's medical decision maker Rick Schwartz   Pt Overview and Major Events to Date:  3/27 - admitted  Assessment and Plan: Rick Schwartz is a 82 y.o. male presenting with elevated creatinine, like acute prerenal kidney injury with lasix and losartan. This resolved with holding lasix and losartan.   Pertinent Pmhx includes cerebral palsy, HLD, HTN, PAD, Prostate cancer, PE  * AKI (acute kidney injury) (HCC) Creatinine AM 1.49>2.01>2.62. Creatinine 0.65 on 02/02/21, unclear baseline. Repeat K 3.3, given additional Kclor. Continue holding home losartan, restart lasix given HFrEF. Renal U/S wnl.  -AM BMP -Caution with fluid resuscitation due to HFrEF -Replete potassium as need -Hold home losartan 25mg  daily at discharge -restart home lasix  Chronic HFrEF (heart failure with reduced ejection fraction) (HCC) Last ECHO EF <20% in 2022. -Hold Losartan as above -Repeat ECHO   FEN/GI: Heart healthy diet  PPx: Eliquis  Dispo:Home pending clinical improvement .    Subjective:  NAEO. Patient is alert and oriented to self, place, year. Not oriented to month. Is able to confirm that he uses wheelchair at baseline and lives with sister. Ate food yesterday. Denies any acute pain.   Objective: Temp:  [97.6 F (36.4 C)-98.1 F (36.7 C)] 98.1 F (36.7 C) (03/28 0746) Pulse Rate:  [71-89] 73 (03/28 0746) Resp:  [17-23] 17 (03/28 0505) BP: (107-144)/(54-86) 115/57 (03/28 0746) SpO2:  [97 %-100 %] 98 % (03/28 0746) Physical Exam: General: alert, in NAD, chronically ill-appearing Cardiovascular: RRR. No m/r/g. Respiratory: CTAB. Normal WOB on RA.  Abdomen: soft, non-tender, non-distended  Extremities: RUE contracted, RLE AKA. At  least 3/5 strength in LUE and LLE.    Laboratory: Most recent CBC Lab Results  Component Value Date   WBC 7.8 06/13/2022   HGB 15.5 06/13/2022   HCT 45.4 06/13/2022   MCV 86.8 06/13/2022   PLT 320 06/13/2022   Most recent BMP    Latest Ref Rng & Units 06/14/2022    8:22 AM  BMP  Glucose 70 - 99 mg/dL 100   BUN 8 - 23 mg/dL 21   Creatinine 0.61 - 1.24 mg/dL 1.49   Sodium 135 - 145 mmol/L 139   Potassium 3.5 - 5.1 mmol/L 3.3   Chloride 98 - 111 mmol/L 99   CO2 22 - 32 mmol/L 29   Calcium 8.9 - 10.3 mg/dL 8.5     Rick Lundborg, MD 06/14/2022, 12:46 PM  PGY-1, [facility] Intern pager: [phone], text pages welcome Secure chat group [place]

## 2022-06-14 NOTE — Progress Notes (Signed)
PT Cancellation Note  Patient Details Name: Rick Schwartz MRN: [id number] DOB: 1941-01-17   Cancelled Treatment:    Reason Eval/Treat Not Completed: PT screened, no needs identified, will sign off (Spoke with caregiver Mardene Celeste who states pt was total care PTA and they use hoyer lift for getting him up daily.  Has all equipment as well.  No PT needs.  Will sign off.)   Alvira Philips 06/14/2022, 9:36 AM Jonael Paradiso M,PT Acute Rehab Services [phone]

## 2022-06-14 NOTE — Progress Notes (Signed)
OT Cancellation Note  Patient Details Name: Rick Schwartz MRN: [id number] DOB: 1941/02/16   Cancelled Treatment:    Reason Eval/Treat Not Completed: OT screened, no needs identified, will sign off. Per conversation with PT who spoke on the phone with caregiver Mardene Celeste, pt is total care at baseline, Hoyer lift for transfers to w/c. Will screen out.   Tyrone Schimke, OT Acute Rehabilitation Services Office: [phone]   Hortencia Pilar 06/14/2022, 9:41 AM

## 2022-06-19 NOTE — Progress Notes (Unsigned)
  SUBJECTIVE:   CHIEF COMPLAINT / HPI:   HFU, admission 3/27-3/28 for elevated Cr and hypokalemia. Normal renal u/s. Echo reflective of HFpEF, LVEF 45-50% with G1DD. Losartan and lasix held.  Per D/C summary: PCP Follow-up Recommendations:  BMP - recheck Cr and K Recheck blood pressure, Losartan held at discharge due to soft pressures.   Since discharge, doing well, no concerns. Making urine. No swelling or dyspnea  Has been taking Losartan at home despite being held upon discharge. Has not been using Lasix. Staying hydrated.  Confirmed CODE STATUS DNR. Sister is POA  PERTINENT  PMH / PSH:   Past Medical History:  Diagnosis Date   Acute respiratory failure with hypoxemia    Aspiration pneumonia 01/20/2021   Bradycardia 02/08/2021   Cancer    Prostate   Cerebral palsy    Community acquired pneumonia    Cough 01/21/2012   COVID-19 11/22/2020   Hypercalcemia 07/18/2020   Hyperlipidemia    Hypernatremia 07/18/2020   Hypertension    Peripheral arterial disease    critical limb ischemia   SBO (small bowel obstruction)    Sepsis 07/18/2020    OBJECTIVE:  BP 134/68   Pulse 99   SpO2 95%   General: NAD, pleasant, able to participate in exam, in wheelchair, largely nonverbal but able to communicate Respiratory: No respiratory distress, CTAB normal WOB on RA CV: RRR no murmurs Ext: RUE contracture. LUE more mobile but contracture present in hands. RLE s/p AKA. No edema in LLE.  ASSESSMENT/PLAN:   1. Acute kidney injury Admitted to the hospital for elevated creatinine and hypokalemia.  Normal renal ultrasound.  Lasix stopped upon discharge.  Euvolemic and making urine.  Will recheck BMP to ensure that his kidney function is stable. - Basic Metabolic Panel  2. Chronic heart failure with preserved ejection fraction Repeat echo in hospital showed EF 45 to 50% with G1 DD.  Lasix stopped.  Patient is euvolemic today.  Lasix DC'd, continue losartan 25 mg daily.  3. Goals of  care, counseling/discussion CODE STATUS DNR. Sister is POA.    Return in about 6 months (around 12/21/2022).  August Albino, MD [facility]

## 2022-06-21 ENCOUNTER — Ambulatory Visit (INDEPENDENT_AMBULATORY_CARE_PROVIDER_SITE_OTHER): Payer: 59 | Admitting: Family Medicine

## 2022-06-21 ENCOUNTER — Encounter: Payer: Self-pay | Admitting: Family Medicine

## 2022-06-21 VITALS — BP 134/68 | HR 99

## 2022-06-21 DIAGNOSIS — Z7189 Other specified counseling: Secondary | ICD-10-CM

## 2022-06-21 DIAGNOSIS — N179 Acute kidney failure, unspecified: Secondary | ICD-10-CM | POA: Diagnosis not present

## 2022-06-21 DIAGNOSIS — I5032 Chronic diastolic (congestive) heart failure: Secondary | ICD-10-CM

## 2022-06-21 NOTE — Patient Instructions (Signed)
It was wonderful to see you today.  Please bring ALL of your medications with you to every visit.   Updates from today's visit:  I am checking your kidney function again, I will give you a call if anything is abnormal or we need to make any changes.  Otherwise, I will send a MyChart message.  Please follow up in 6 months   Thank you for choosing [facility].   Please call [phone] with any questions about today's appointment.  Please be sure to schedule follow up at the front  desk before you leave today.   August Albino, MD  Family Medicine

## 2022-06-22 LAB — BASIC METABOLIC PANEL
BUN/Creatinine Ratio: 5 — ABNORMAL LOW (ref 10–24)
BUN: 6 mg/dL — ABNORMAL LOW (ref 8–27)
CO2: 22 mmol/L (ref 20–29)
Calcium: 9.6 mg/dL (ref 8.6–10.2)
Chloride: 100 mmol/L (ref 96–106)
Creatinine, Ser: 1.29 mg/dL — ABNORMAL HIGH (ref 0.76–1.27)
Glucose: 128 mg/dL — ABNORMAL HIGH (ref 70–99)
Potassium: 3.1 mmol/L — ABNORMAL LOW (ref 3.5–5.2)
Sodium: 144 mmol/L (ref 134–144)
eGFR: 56 mL/min/{1.73_m2} — ABNORMAL LOW (ref >59)

## 2022-09-23 NOTE — Patient Instructions (Incomplete)
Rick Schwartz , Thank you for taking time to come for your Medicare Wellness Visit. I appreciate your ongoing commitment to your health goals. Please review the following plan we discussed and let me know if I can assist you in the future.   These are the goals we discussed:  Goals      Blood Pressure < 140/90     Prevent falls        This is a list of the screening recommended for you and due dates:  Health Maintenance  Topic Date Due   DTaP/Tdap/Td vaccine (2 - Tdap) 10/17/2008   COVID-19 Vaccine (5 - 2023-24 season) 11/17/2021   Zoster (Shingles) Vaccine (1 of 2) 12/25/2022*   Flu Shot  10/18/2022   Medicare Annual Wellness Visit  09/24/2023   Pneumonia Vaccine  Completed   HPV Vaccine  Aged Out   Hepatitis C Screening  Discontinued  *Topic was postponed. The date shown is not the original due date.    Advanced directives: We have a copy of your advanced directives available in your record should your provider ever need to access them.  Conditions/risks identified: Aim for 30 minutes of exercise or brisk walking, 6-8 glasses of water, and 5 servings of fruits and vegetables each day.  Next appointment: Follow up in one year for your annual wellness visit.   Preventive Care 81 Years and Older, Male  Preventive care refers to lifestyle choices and visits with your health care provider that can promote health and wellness. What does preventive care include? A yearly physical exam. This is also called an annual well check. Dental exams once or twice a year. Routine eye exams. Ask your health care provider how often you should have your eyes checked. Personal lifestyle choices, including: Daily care of your teeth and gums. Regular physical activity. Eating a healthy diet. Avoiding tobacco and drug use. Limiting alcohol use. Practicing safe sex. Taking low doses of aspirin every day. Taking vitamin and mineral supplements as recommended by your health care provider. What  happens during an annual well check? The services and screenings done by your health care provider during your annual well check will depend on your age, overall health, lifestyle risk factors, and family history of disease. Counseling  Your health care provider may ask you questions about your: Alcohol use. Tobacco use. Drug use. Emotional well-being. Home and relationship well-being. Sexual activity. Eating habits. History of falls. Memory and ability to understand (cognition). Work and work Astronomer. Screening  You may have the following tests or measurements: Height, weight, and BMI. Blood pressure. Lipid and cholesterol levels. These may be checked every 5 years, or more frequently if you are over 13 years old. Skin check. Lung cancer screening. You may have this screening every year starting at age 29 if you have a 30-pack-year history of smoking and currently smoke or have quit within the past 15 years. Fecal occult blood test (FOBT) of the stool. You may have this test every year starting at age 23. Flexible sigmoidoscopy or colonoscopy. You may have a sigmoidoscopy every 5 years or a colonoscopy every 10 years starting at age 34. Prostate cancer screening. Recommendations will vary depending on your family history and other risks. Hepatitis C blood test. Hepatitis B blood test. Sexually transmitted disease (STD) testing. Diabetes screening. This is done by checking your blood sugar (glucose) after you have not eaten for a while (fasting). You may have this done every 1-3 years. Abdominal aortic aneurysm (AAA) screening.  You may need this if you are a current or former smoker. Osteoporosis. You may be screened starting at age 53 if you are at high risk. Talk with your health care provider about your test results, treatment options, and if necessary, the need for more tests. Vaccines  Your health care provider may recommend certain vaccines, such as: Influenza vaccine. This  is recommended every year. Tetanus, diphtheria, and acellular pertussis (Tdap, Td) vaccine. You may need a Td booster every 10 years. Zoster vaccine. You may need this after age 53. Pneumococcal 13-valent conjugate (PCV13) vaccine. One dose is recommended after age 2. Pneumococcal polysaccharide (PPSV23) vaccine. One dose is recommended after age 13. Talk to your health care provider about which screenings and vaccines you need and how often you need them. This information is not intended to replace advice given to you by your health care provider. Make sure you discuss any questions you have with your health care provider. Document Released: 04/01/2015 Document Revised: 11/23/2015 Document Reviewed: 01/04/2015 Elsevier Interactive Patient Education  2017 [employer].  Fall Prevention in the Home Falls can cause injuries. They can happen to people of all ages. There are many things you can do to make your home safe and to help prevent falls. What can I do on the outside of my home? Regularly fix the edges of walkways and driveways and fix any cracks. Remove anything that might make you trip as you walk through a door, such as a raised step or threshold. Trim any bushes or trees on the path to your home. Use bright outdoor lighting. Clear any walking paths of anything that might make someone trip, such as rocks or tools. Regularly check to see if handrails are loose or broken. Make sure that both sides of any steps have handrails. Any raised decks and porches should have guardrails on the edges. Have any leaves, snow, or ice cleared regularly. Use sand or salt on walking paths during winter. Clean up any spills in your garage right away. This includes oil or grease spills. What can I do in the bathroom? Use night lights. Install grab bars by the toilet and in the tub and shower. Do not use towel bars as grab bars. Use non-skid mats or decals in the tub or shower. If you need to sit down  in the shower, use a plastic, non-slip stool. Keep the floor dry. Clean up any water that spills on the floor as soon as it happens. Remove soap buildup in the tub or shower regularly. Attach bath mats securely with double-sided non-slip rug tape. Do not have throw rugs and other things on the floor that can make you trip. What can I do in the bedroom? Use night lights. Make sure that you have a light by your bed that is easy to reach. Do not use any sheets or blankets that are too big for your bed. They should not hang down onto the floor. Have a firm chair that has side arms. You can use this for support while you get dressed. Do not have throw rugs and other things on the floor that can make you trip. What can I do in the kitchen? Clean up any spills right away. Avoid walking on wet floors. Keep items that you use a lot in easy-to-reach places. If you need to reach something above you, use a strong step stool that has a grab bar. Keep electrical cords out of the way. Do not use floor polish or wax  that makes floors slippery. If you must use wax, use non-skid floor wax. Do not have throw rugs and other things on the floor that can make you trip. What can I do with my stairs? Do not leave any items on the stairs. Make sure that there are handrails on both sides of the stairs and use them. Fix handrails that are broken or loose. Make sure that handrails are as long as the stairways. Check any carpeting to make sure that it is firmly attached to the stairs. Fix any carpet that is loose or worn. Avoid having throw rugs at the top or bottom of the stairs. If you do have throw rugs, attach them to the floor with carpet tape. Make sure that you have a light switch at the top of the stairs and the bottom of the stairs. If you do not have them, ask someone to add them for you. What else can I do to help prevent falls? Wear shoes that: Do not have high heels. Have rubber bottoms. Are comfortable  and fit you well. Are closed at the toe. Do not wear sandals. If you use a stepladder: Make sure that it is fully opened. Do not climb a closed stepladder. Make sure that both sides of the stepladder are locked into place. Ask someone to hold it for you, if possible. Clearly mark and make sure that you can see: Any grab bars or handrails. First and last steps. Where the edge of each step is. Use tools that help you move around (mobility aids) if they are needed. These include: Canes. Walkers. Scooters. Crutches. Turn on the lights when you go into a dark area. Replace any light bulbs as soon as they burn out. Set up your furniture so you have a clear path. Avoid moving your furniture around. If any of your floors are uneven, fix them. If there are any pets around you, be aware of where they are. Review your medicines with your doctor. Some medicines can make you feel dizzy. This can increase your chance of falling. Ask your doctor what other things that you can do to help prevent falls. This information is not intended to replace advice given to you by your health care provider. Make sure you discuss any questions you have with your health care provider. Document Released: 12/30/2008 Document Revised: 08/11/2015 Document Reviewed: 04/09/2014 Elsevier Interactive Patient Education  2017 [employer].

## 2022-09-23 NOTE — Progress Notes (Unsigned)
Subjective:   Rick Schwartz is a 82 y.o. male who presents for Medicare Annual/Subsequent preventive examination.  Visit Complete: {VISITMETHOD:[phone]}  Patient Medicare AWV questionnaire was completed by the patient on ***; I have confirmed that all information answered by patient is correct and no changes since this date.  Review of Systems    ***       Objective:    There were no vitals filed for this visit. There is no height or weight on file to calculate BMI.     06/04/2022    2:54 PM 12/21/2021    3:24 PM 05/19/2021    2:26 PM 02/08/2021    9:38 AM 01/27/2021    9:26 AM 01/22/2021    8:00 PM 12/13/2020    9:57 AM  Advanced Directives  Does Patient Have a Medical Advance Directive? No No No Yes Yes Yes No  Type of Advance Directive      Out of facility DNR (pink MOST or yellow form)   Does patient want to make changes to medical advance directive?    Yes (Inpatient - patient defers changing a medical advance directive and declines information at this time)  Yes (Inpatient - patient defers changing a medical advance directive and declines information at this time)   Would patient like information on creating a medical advance directive? No - Patient declined No - Patient declined No - Patient declined No - Patient declined No - Patient declined  No - Patient declined  Pre-existing out of facility DNR order (yellow form or pink MOST form)      Physician notified to receive inpatient order     Current Medications (verified) Outpatient Encounter Medications as of 09/24/2022  Medication Sig   acetaminophen (TYLENOL) 325 MG tablet Take 2 tablets (650 mg total) by mouth every 6 (six) hours. (Patient not taking: Reported on 12/21/2021)   apixaban (ELIQUIS) 5 MG TABS tablet TAKE 1 TABLET(5 MG) BY MOUTH TWICE DAILY (Patient taking differently: Take 5 mg by mouth 2 (two) times daily.)   losartan (COZAAR) 25 MG tablet Take 25 mg by mouth daily.   potassium chloride (MICRO-K) 10 MEQ  CR capsule TAKE 1 CAPSULE(10 MEQ) BY MOUTH DAILY (Patient taking differently: Take 10 mEq by mouth daily.)   rosuvastatin (CRESTOR) 20 MG tablet TAKE 1 TABLET(20 MG) BY MOUTH DAILY (Patient taking differently: Take 20 mg by mouth daily.)   No facility-administered encounter medications on file as of 09/24/2022.    Allergies (verified) Patient has no known allergies.   History: Past Medical History:  Diagnosis Date   Acute respiratory failure with hypoxemia (HCC)    Aspiration pneumonia (HCC) 01/20/2021   Bradycardia 02/08/2021   Cancer (HCC)    Prostate   Cerebral palsy (HCC)    Community acquired pneumonia    Cough 01/21/2012   COVID-19 11/22/2020   Hypercalcemia 07/18/2020   Hyperlipidemia    Hypernatremia 07/18/2020   Hypertension    Peripheral arterial disease (HCC)    critical limb ischemia   SBO (small bowel obstruction) (HCC)    Sepsis (HCC) 07/18/2020   Past Surgical History:  Procedure Laterality Date   AMPUTATION Right 07/22/2020   Procedure: RIGHT ABOVE KNEE AMPUTATION;  Surgeon: Nadara Mustard, MD;  Location: [facility] OR;  Service: Orthopedics;  Laterality: Right;   PROSTATE SURGERY     Family History  Problem Relation Age of Onset   Diabetes Mother    Hypertension Mother    Hypertension Father  Diabetes Sister    Hypertension Sister    Diabetes Brother    Hypertension Brother    Diabetes Sister    Thyroid disease Sister    Sudden death Brother    Social History   Socioeconomic History   Marital status: Single    Spouse name: Not on file   Number of children: Not on file   Years of education: Not on file   Highest education level: Not on file  Occupational History   Not on file  Tobacco Use   Smoking status: Former    Packs/day: 0.25    Years: 35.00    Additional pack years: 0.00    Total pack years: 8.75    Types: Cigarettes    Quit date: 10/12/1994    Years since quitting: 27.9   Smokeless tobacco: Never  Vaping Use   Vaping Use: Never used   Substance and Sexual Activity   Alcohol use: Yes    Comment: Occasional beer and gin   Drug use: No   Sexual activity: Never    Birth control/protection: None  Other Topics Concern   Not on file  Social History Narrative   Not on file   Social Determinants of Health   Financial Resource Strain: Low Risk  (02/15/2021)   Overall Financial Resource Strain (CARDIA)    Difficulty of Paying Living Expenses: Not very hard  Food Insecurity: No Food Insecurity (02/15/2021)   Hunger Vital Sign    Worried About Running Out of Food in the Last Year: Never true    Ran Out of Food in the Last Year: Never true  Transportation Needs: No Transportation Needs (02/15/2021)   PRAPARE - Administrator, Civil Service (Medical): No    Lack of Transportation (Non-Medical): No  Physical Activity: Not on file  Stress: Not on file  Social Connections: Not on file    Tobacco Counseling Counseling given: Not Answered   Clinical Intake:                        Activities of Daily Living     No data to display          Patient Care Team: Vonna Drafts, MD as PCP - General (Family Medicine)  Indicate any recent Medical Services you may have received from other than [facility] providers in the past year (date may be approximate).     Assessment:   This is a routine wellness examination for [place].  Hearing/Vision screen No results found.  Dietary issues and exercise activities discussed:     Goals Addressed   None    Depression Screen    06/21/2022    2:09 PM 06/04/2022    2:54 PM 12/21/2021    3:26 PM 05/19/2021    2:24 PM 02/08/2021    9:37 AM 02/08/2021    9:34 AM 01/27/2021    9:29 AM  PHQ 2/9 Scores  PHQ - 2 Score 0 0 0 0 0 0 0  PHQ- 9 Score 0 0 0 0 0 0 0    Fall Risk    06/21/2022    2:09 PM 06/04/2022    2:53 PM 12/21/2021    3:25 PM 05/19/2021    2:24 PM 02/08/2021    9:37 AM  Fall Risk   Falls in the past year? 0 0 0 0   Comment    non ambulatory  non ambulatory  Number falls in past yr: 0 0  0 0   Injury with Fall? 0 0 0 0     MEDICARE RISK AT HOME:   TIMED UP AND GO:  Was the test performed?  No    Cognitive Function:        Immunizations Immunization History  Administered Date(s) Administered   Fluad Quad(high Dose 65+) 12/21/2021   Influenza Split 12/25/2010, 11/28/2011   Influenza Whole 01/02/2007, 01/27/2010   Influenza, High Dose Seasonal PF 01/11/2015   Influenza,inj,Quad PF,6+ Mos 01/05/2013, 01/25/2014   Influenza-Unspecified 12/28/2015, 01/07/2017   PFIZER(Purple Top)SARS-COV-2 Vaccination 05/18/2019, 06/10/2019, 02/08/2020   Pfizer Covid-19 Vaccine Bivalent Booster 76yrs & up 05/19/2021   Pneumococcal Conjugate-13 10/18/2015   Pneumococcal Polysaccharide-23 10/17/1997, 11/28/2011   Td 10/18/1998    {TDAP status:2101805}  Pneumococcal vaccine status: Up to date  Covid-19 vaccine status: Information provided on how to obtain vaccines.   Qualifies for Shingles Vaccine? Yes   Zostavax completed No   Shingrix Completed?: No.    Education has been provided regarding the importance of this vaccine. Patient has been advised to call insurance company to determine out of pocket expense if they have not yet received this vaccine. Advised may also receive vaccine at local pharmacy or Health Dept. Verbalized acceptance and understanding.  Screening Tests Health Maintenance  Topic Date Due   Zoster Vaccines- Shingrix (1 of 2) Never done   DTaP/Tdap/Td (2 - Tdap) 10/17/2008   Medicare Annual Wellness (AWV)  10/17/2016   COVID-19 Vaccine (5 - 2023-24 season) 11/17/2021   INFLUENZA VACCINE  10/18/2022   Pneumonia Vaccine 29+ Years old  Completed   HPV VACCINES  Aged Out   Hepatitis C Screening  Discontinued    Health Maintenance  Health Maintenance Due  Topic Date Due   Zoster Vaccines- Shingrix (1 of 2) Never done   DTaP/Tdap/Td (2 - Tdap) 10/17/2008   Medicare Annual Wellness (AWV)  10/17/2016    COVID-19 Vaccine (5 - 2023-24 season) 11/17/2021    Colorectal cancer screening: No longer required.   Lung Cancer Screening: (Low Dose CT Chest recommended if Age 56-80 years, 20 pack-year currently smoking OR have quit w/in 15years.) does not qualify.   Lung Cancer Screening Referral: n/a  Additional Screening:  Hepatitis C Screening: does not qualify  Vision Screening: Recommended annual ophthalmology exams for early detection of glaucoma and other disorders of the eye. Is the patient up to date with their annual eye exam?  {YES/NO:21197} Who is the provider or what is the name of the office in which the patient attends annual eye exams? *** If pt is not established with a provider, would they like to be referred to a provider to establish care? {YES/NO:21197}.   Dental Screening: Recommended annual dental exams for proper oral hygiene  Community Resource Referral / Chronic Care Management: CRR required this visit?  {YES/NO:21197}  CCM required this visit?  {CCM Required [phone]}     Plan:     I have personally reviewed and noted the following in the patient's chart:   Medical and social history Use of alcohol, tobacco or illicit drugs  Current medications and supplements including opioid prescriptions. {Opioid Prescriptions:2766513273} Functional ability and status Nutritional status Physical activity Advanced directives List of other physicians Hospitalizations, surgeries, and ER visits in previous 12 months Vitals Screenings to include cognitive, depression, and falls Referrals and appointments  In addition, I have reviewed and discussed with patient certain preventive protocols, quality metrics, and best practice recommendations. A written personalized care plan for preventive services as  well as general preventive health recommendations were provided to patient.     Kandis Fantasia [place], [place]   07/23/4330   After Visit Summary: {CHL AMB AWV  After Visit Summary:[phone]}  Nurse Notes: ***

## 2022-09-24 ENCOUNTER — Ambulatory Visit (INDEPENDENT_AMBULATORY_CARE_PROVIDER_SITE_OTHER): Payer: 59

## 2022-09-24 VITALS — Ht 67.0 in | Wt 119.0 lb

## 2022-09-24 DIAGNOSIS — Z0000 Encounter for general adult medical examination without abnormal findings: Secondary | ICD-10-CM

## 2022-10-06 ENCOUNTER — Other Ambulatory Visit: Payer: Self-pay | Admitting: Family Medicine

## 2022-11-10 ENCOUNTER — Inpatient Hospital Stay (HOSPITAL_COMMUNITY)
Admission: EM | Admit: 2022-11-10 | Discharge: 2022-11-17 | DRG: 637 | Disposition: A | Discharge status: Home Health | Payer: 59 | Source: Ambulatory Visit | Attending: Family Medicine | Admitting: Family Medicine

## 2022-11-10 ENCOUNTER — Emergency Department (HOSPITAL_COMMUNITY): Payer: 59

## 2022-11-10 ENCOUNTER — Encounter (HOSPITAL_COMMUNITY): Payer: Self-pay

## 2022-11-10 DIAGNOSIS — R54 Age-related physical debility: Secondary | ICD-10-CM | POA: Diagnosis present

## 2022-11-10 DIAGNOSIS — R7989 Other specified abnormal findings of blood chemistry: Secondary | ICD-10-CM

## 2022-11-10 DIAGNOSIS — N1831 Chronic kidney disease, stage 3a: Secondary | ICD-10-CM | POA: Diagnosis present

## 2022-11-10 DIAGNOSIS — E1165 Type 2 diabetes mellitus with hyperglycemia: Secondary | ICD-10-CM

## 2022-11-10 DIAGNOSIS — R319 Hematuria, unspecified: Secondary | ICD-10-CM | POA: Diagnosis present

## 2022-11-10 DIAGNOSIS — Z7401 Bed confinement status: Secondary | ICD-10-CM

## 2022-11-10 DIAGNOSIS — Z87891 Personal history of nicotine dependence: Secondary | ICD-10-CM

## 2022-11-10 DIAGNOSIS — R64 Cachexia: Secondary | ICD-10-CM | POA: Diagnosis present

## 2022-11-10 DIAGNOSIS — E860 Dehydration: Secondary | ICD-10-CM | POA: Diagnosis present

## 2022-11-10 DIAGNOSIS — N390 Urinary tract infection, site not specified: Secondary | ICD-10-CM | POA: Diagnosis present

## 2022-11-10 DIAGNOSIS — F0390 Unspecified dementia without behavioral disturbance: Secondary | ICD-10-CM | POA: Diagnosis present

## 2022-11-10 DIAGNOSIS — R350 Frequency of micturition: Secondary | ICD-10-CM | POA: Diagnosis present

## 2022-11-10 DIAGNOSIS — F32A Depression, unspecified: Secondary | ICD-10-CM | POA: Diagnosis present

## 2022-11-10 DIAGNOSIS — E1110 Type 2 diabetes mellitus with ketoacidosis without coma: Secondary | ICD-10-CM | POA: Diagnosis not present

## 2022-11-10 DIAGNOSIS — E871 Hypo-osmolality and hyponatremia: Secondary | ICD-10-CM

## 2022-11-10 DIAGNOSIS — E873 Alkalosis: Secondary | ICD-10-CM | POA: Diagnosis not present

## 2022-11-10 DIAGNOSIS — D72829 Elevated white blood cell count, unspecified: Secondary | ICD-10-CM | POA: Diagnosis present

## 2022-11-10 DIAGNOSIS — I2489 Other forms of acute ischemic heart disease: Secondary | ICD-10-CM | POA: Diagnosis present

## 2022-11-10 DIAGNOSIS — N400 Enlarged prostate without lower urinary tract symptoms: Secondary | ICD-10-CM | POA: Diagnosis present

## 2022-11-10 DIAGNOSIS — Z8249 Family history of ischemic heart disease and other diseases of the circulatory system: Secondary | ICD-10-CM

## 2022-11-10 DIAGNOSIS — E1122 Type 2 diabetes mellitus with diabetic chronic kidney disease: Secondary | ICD-10-CM | POA: Diagnosis present

## 2022-11-10 DIAGNOSIS — E1151 Type 2 diabetes mellitus with diabetic peripheral angiopathy without gangrene: Secondary | ICD-10-CM | POA: Diagnosis present

## 2022-11-10 DIAGNOSIS — Z681 Body mass index (BMI) 19 or less, adult: Secondary | ICD-10-CM

## 2022-11-10 DIAGNOSIS — Z86711 Personal history of pulmonary embolism: Secondary | ICD-10-CM

## 2022-11-10 DIAGNOSIS — Z89611 Acquired absence of right leg above knee: Secondary | ICD-10-CM

## 2022-11-10 DIAGNOSIS — E1100 Type 2 diabetes mellitus with hyperosmolarity without nonketotic hyperglycemic-hyperosmolar coma (NKHHC): Secondary | ICD-10-CM

## 2022-11-10 DIAGNOSIS — Z79899 Other long term (current) drug therapy: Secondary | ICD-10-CM

## 2022-11-10 DIAGNOSIS — N179 Acute kidney failure, unspecified: Secondary | ICD-10-CM | POA: Diagnosis present

## 2022-11-10 DIAGNOSIS — E870 Hyperosmolality and hypernatremia: Secondary | ICD-10-CM

## 2022-11-10 DIAGNOSIS — Z7901 Long term (current) use of anticoagulants: Secondary | ICD-10-CM

## 2022-11-10 DIAGNOSIS — E039 Hypothyroidism, unspecified: Secondary | ICD-10-CM | POA: Diagnosis present

## 2022-11-10 DIAGNOSIS — I2510 Atherosclerotic heart disease of native coronary artery without angina pectoris: Secondary | ICD-10-CM | POA: Diagnosis present

## 2022-11-10 DIAGNOSIS — A419 Sepsis, unspecified organism: Secondary | ICD-10-CM | POA: Diagnosis present

## 2022-11-10 DIAGNOSIS — E1065 Type 1 diabetes mellitus with hyperglycemia: Secondary | ICD-10-CM

## 2022-11-10 DIAGNOSIS — Z993 Dependence on wheelchair: Secondary | ICD-10-CM

## 2022-11-10 DIAGNOSIS — R1312 Dysphagia, oropharyngeal phase: Secondary | ICD-10-CM | POA: Diagnosis present

## 2022-11-10 DIAGNOSIS — Z66 Do not resuscitate: Secondary | ICD-10-CM | POA: Diagnosis present

## 2022-11-10 DIAGNOSIS — I130 Hypertensive heart and chronic kidney disease with heart failure and stage 1 through stage 4 chronic kidney disease, or unspecified chronic kidney disease: Secondary | ICD-10-CM | POA: Diagnosis present

## 2022-11-10 DIAGNOSIS — Z8546 Personal history of malignant neoplasm of prostate: Secondary | ICD-10-CM

## 2022-11-10 DIAGNOSIS — R0682 Tachypnea, not elsewhere classified: Secondary | ICD-10-CM | POA: Diagnosis present

## 2022-11-10 DIAGNOSIS — E785 Hyperlipidemia, unspecified: Secondary | ICD-10-CM | POA: Diagnosis present

## 2022-11-10 DIAGNOSIS — I493 Ventricular premature depolarization: Secondary | ICD-10-CM | POA: Diagnosis present

## 2022-11-10 DIAGNOSIS — E119 Type 2 diabetes mellitus without complications: Secondary | ICD-10-CM

## 2022-11-10 DIAGNOSIS — R8290 Unspecified abnormal findings in urine: Secondary | ICD-10-CM | POA: Insufficient documentation

## 2022-11-10 DIAGNOSIS — E876 Hypokalemia: Secondary | ICD-10-CM | POA: Diagnosis present

## 2022-11-10 DIAGNOSIS — Z515 Encounter for palliative care: Secondary | ICD-10-CM

## 2022-11-10 DIAGNOSIS — E46 Unspecified protein-calorie malnutrition: Secondary | ICD-10-CM | POA: Diagnosis present

## 2022-11-10 DIAGNOSIS — Z7984 Long term (current) use of oral hypoglycemic drugs: Secondary | ICD-10-CM

## 2022-11-10 DIAGNOSIS — I5022 Chronic systolic (congestive) heart failure: Secondary | ICD-10-CM | POA: Diagnosis present

## 2022-11-10 DIAGNOSIS — G800 Spastic quadriplegic cerebral palsy: Secondary | ICD-10-CM | POA: Diagnosis present

## 2022-11-10 DIAGNOSIS — R809 Proteinuria, unspecified: Secondary | ICD-10-CM | POA: Diagnosis present

## 2022-11-10 LAB — I-STAT VENOUS BLOOD GAS, ED
Acid-Base Excess: 10 mmol/L — ABNORMAL HIGH (ref 0.0–2.0)
Bicarbonate: 37.4 mmol/L — ABNORMAL HIGH (ref 20.0–28.0)
Calcium, Ion: 1.02 mmol/L — ABNORMAL LOW (ref 1.15–1.40)
HCT: 49 % (ref 39.0–52.0)
Hemoglobin: 16.7 g/dL (ref 13.0–17.0)
O2 Saturation: 88 %
Potassium: 2.1 mmol/L — CL (ref 3.5–5.1)
Sodium: 141 mmol/L (ref 135–145)
TCO2: 39 mmol/L — ABNORMAL HIGH (ref 22–32)
pCO2, Ven: 58.3 mmHg (ref 44–60)
pH, Ven: 7.416 (ref 7.25–7.43)
pO2, Ven: 56 mmHg — ABNORMAL HIGH (ref 32–45)

## 2022-11-10 LAB — CBC WITH DIFFERENTIAL/PLATELET
Abs Immature Granulocytes: 0.07 10*3/uL (ref 0.00–0.07)
Basophils Absolute: 0 10*3/uL (ref 0.0–0.1)
Basophils Relative: 0 %
Eosinophils Absolute: 0 10*3/uL (ref 0.0–0.5)
Eosinophils Relative: 0 %
HCT: 49.6 % (ref 39.0–52.0)
Hemoglobin: 15.9 g/dL (ref 13.0–17.0)
Immature Granulocytes: 1 %
Lymphocytes Relative: 6 %
Lymphs Abs: 0.7 10*3/uL (ref 0.7–4.0)
MCH: 28.3 pg (ref 26.0–34.0)
MCHC: 32.1 g/dL (ref 30.0–36.0)
MCV: 88.4 fL (ref 80.0–100.0)
Monocytes Absolute: 0.8 10*3/uL (ref 0.1–1.0)
Monocytes Relative: 7 %
Neutro Abs: 10.6 10*3/uL — ABNORMAL HIGH (ref 1.7–7.7)
Neutrophils Relative %: 86 %
Platelets: 300 10*3/uL (ref 150–400)
RBC: 5.61 MIL/uL (ref 4.22–5.81)
RDW: 14.2 % (ref 11.5–15.5)
WBC: 12.3 10*3/uL — ABNORMAL HIGH (ref 4.0–10.5)
nRBC: 0 % (ref 0.0–0.2)

## 2022-11-10 LAB — COMPREHENSIVE METABOLIC PANEL
ALT: 21 U/L (ref 0–44)
AST: 33 U/L (ref 15–41)
Albumin: 2.8 g/dL — ABNORMAL LOW (ref 3.5–5.0)
Alkaline Phosphatase: 70 U/L (ref 38–126)
Anion gap: 26 — ABNORMAL HIGH (ref 5–15)
BUN: 31 mg/dL — ABNORMAL HIGH (ref 8–23)
CO2: 29 mmol/L (ref 22–32)
Calcium: 9.2 mg/dL (ref 8.9–10.3)
Chloride: 87 mmol/L — ABNORMAL LOW (ref 98–111)
Creatinine, Ser: 2.08 mg/dL — ABNORMAL HIGH (ref 0.61–1.24)
GFR, Estimated: 31 mL/min — ABNORMAL LOW (ref >60)
Glucose, Bld: 939 mg/dL (ref 70–99)
Potassium: 3 mmol/L — ABNORMAL LOW (ref 3.5–5.1)
Sodium: 142 mmol/L (ref 135–145)
Total Bilirubin: 2.6 mg/dL — ABNORMAL HIGH (ref 0.3–1.2)
Total Protein: 6.4 g/dL — ABNORMAL LOW (ref 6.5–8.1)

## 2022-11-10 LAB — CBG MONITORING, ED: Glucose-Capillary: >600 mg/dL (ref 70–99)

## 2022-11-10 LAB — MAGNESIUM: Magnesium: 3.1 mg/dL — ABNORMAL HIGH (ref 1.7–2.4)

## 2022-11-10 LAB — PROTIME-INR
INR: 1.4 — ABNORMAL HIGH (ref 0.8–1.2)
Prothrombin Time: 17.2 seconds — ABNORMAL HIGH (ref 11.4–15.2)

## 2022-11-10 LAB — OSMOLALITY: Osmolality: 364 mOsm/kg (ref 275–295)

## 2022-11-10 MED ORDER — SODIUM CHLORIDE 0.9 % IV BOLUS
1000.0000 mL | Freq: Once | INTRAVENOUS | Status: AC
  Administered 2022-11-11: 1000 mL via INTRAVENOUS

## 2022-11-10 NOTE — ED Provider Notes (Signed)
[place] EMERGENCY DEPARTMENT AT [facility] Provider Note   CSN: [id number] Arrival date & time: 11/10/22  2214     History {Add pertinent medical, surgical, social history, OB history to HPI:1} Chief Complaint  Patient presents with   Altered Mental Status    Rick Schwartz is a 82 y.o. male with history of spastic cerebral palsy , CAD hypertension, PAD anticoagulated on Eliquis, hyperlipidemia, prostate cancer and dementia who presents via EMS with concern for altered mental status x 4 days.  Patient's family states that his urine was malodorous darkly colored during that timeframe as well.  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  CBG with EMS was greater than 500.  Documented history of prediabetes but not currently on any medications for hyperglycemia at this time.  Level 5 caveat due to acuity of presentation upon arrival. HPI     Home Medications Prior to Admission medications   Medication Sig Start Date End Date Taking? Authorizing Provider  acetaminophen (TYLENOL) 325 MG tablet Take 2 tablets (650 mg total) by mouth every 6 (six) hours. 09/26/20   Valetta Close, MD  ELIQUIS 5 MG TABS tablet TAKE 1 TABLET(5 MG) BY MOUTH TWICE DAILY 10/08/22   Vonna Drafts, MD  furosemide (LASIX) 20 MG tablet Take 20 mg by mouth daily. 08/17/22   [provider]  losartan (COZAAR) 25 MG tablet Take 25 mg by mouth daily.    [provider]  potassium chloride (MICRO-K) 10 MEQ CR capsule TAKE 1 CAPSULE(10 MEQ) BY MOUTH DAILY Patient taking differently: Take 10 mEq by mouth daily. 06/04/22   Vonna Drafts, MD  rosuvastatin (CRESTOR) 20 MG tablet TAKE 1 TABLET(20 MG) BY MOUTH DAILY Patient taking differently: Take 20 mg by mouth daily. 06/04/22   Vonna Drafts, MD      Allergies    Patient has no known allergies.    Review of Systems   Review of Systems  Unable to perform ROS: Mental status change    Physical Exam Updated  Vital Signs BP (!) 128/58   Pulse 92   Temp (!) 97.4 F (36.3 C) (Oral)   Resp (!) 25   Ht 5\' 6"  (1.676 m)   Wt 55 kg   SpO2 99%   BMI 19.57 kg/m  Physical Exam Vitals and nursing note reviewed.  Constitutional:      Appearance: He is ill-appearing. He is not toxic-appearing.  HENT:     Head: Atraumatic. Mass present.      Mouth/Throat:     Mouth: Mucous membranes are dry.     Dentition: Abnormal dentition.     Pharynx: No oropharyngeal exudate or posterior oropharyngeal erythema.  Eyes:     General: Lids are normal. Vision grossly intact.        Right eye: No discharge.        Left eye: No discharge.     Extraocular Movements: Extraocular movements intact.     Conjunctiva/sclera: Conjunctivae normal.     Pupils: Pupils are equal, round, and reactive to light.  Neck:     Trachea: Trachea normal.     Comments: Patient is nonverbal but nods yes and no to questions. Cardiovascular:     Rate and Rhythm: Tachycardia present. Rhythm irregularly irregular.     Pulses: Normal pulses.     Heart sounds: Normal heart sounds.     Comments: Frequent PVCs on monitor and exam. Right AKA  Pulmonary:  Effort: Pulmonary effort is normal. No tachypnea, bradypnea, accessory muscle usage, prolonged expiration or respiratory distress.     Breath sounds: Normal breath sounds. No wheezing or rales.     Comments: Coarse breath sounds throughout Chest:     Chest wall: No mass, lacerations, deformity, swelling, tenderness or crepitus.  Abdominal:     General: Bowel sounds are normal. There is no distension.     Palpations: Abdomen is soft.     Tenderness: There is no abdominal tenderness.     Hernia: A hernia is present. Hernia is present in the umbilical area.     Comments: Reducible umbilical hernia  Musculoskeletal:        General: No deformity.     Cervical back: Normal range of motion and neck supple.     Left lower leg: No edema.     Comments: Right upper extremity is spastic  contracted to complete flexion of the elbow  Lymphadenopathy:     Cervical: No cervical adenopathy.  Skin:    General: Skin is warm and dry.  Neurological:     Mental Status: He is lethargic and disoriented.     GCS: GCS eye subscore is 3. GCS verbal subscore is 1. GCS motor subscore is 5.  Psychiatric:        Mood and Affect: Mood normal.     ED Results / Procedures / Treatments   Labs (all labs ordered are listed, but only abnormal results are displayed) Labs Reviewed - No data to display  EKG None  Radiology No results found.  Procedures Procedures  {Document cardiac monitor, telemetry assessment procedure when appropriate:1}  Medications Ordered in ED Medications - No data to display  ED Course/ Medical Decision Making/ A&P Clinical Course as of 11/10/22 2325  Sat Nov 10, 2022  2323 CBG is high in the ED  [RS]    Clinical Course User Index [RS] Areta Terwilliger, Eugene Gavia, PA-C   {   Click here for ABCD2, HEART and other calculatorsREFRESH Note before signing :1}                              Medical Decision Making 82 year old presents with AMS.  Intermittently tachycardic, BP reassuring, initial intake hypertension felt to be invalid measurement.  Cardiopulmonary exam without acute findings.  Very dry moist mucous membranes.  The differential diagnosis for AMS is extensive and includes, but is not limited to:  Drug overdose - opioids, alcohol, sedatives, antipsychotics, drug withdrawal, others Metabolic: hypoxia, hypoglycemia, hyperglycemia, hypercalcemia, hypernatremia, hyponatremia, uremia, hepatic encephalopathy, hypothyroidism, hyperthyroidism, vitamin B12 or thiamine deficiency, carbon monoxide poisoning, Wilson's disease, Lactic acidosis, DKA/HHOS Infectious: meningitis, encephalitis, bacteremia/sepsis, urinary tract infection, pneumonia, neurosyphilis Structural: Space-occupying lesion, (brain tumor, subdural hematoma, hydrocephalus,) Vascular: stroke,  subarachnoid hemorrhage, coronary ischemia, hypertensive encephalopathy, CNS vasculitis, thrombotic thrombocytopenic purpura, disseminated intravascular coagulation, hyperviscosity Psychiatric: Schizophrenia, depression; Other: Seizure, hypothermia, heat stroke, ICU psychosis, dementia -"sundowning."  Amount and/or Complexity of Data Reviewed Labs: ordered.    Details: CBC with leukocytosis of 12,000, CBG greater than 600. INR of 1.4 on eliquis Radiology: ordered.   ***  {Document critical care time when appropriate:1} {Document review of labs and clinical decision tools ie heart score, Chads2Vasc2 etc:1}  {Document your independent review of radiology images, and any outside records:1} {Document your discussion with family members, caretakers, and with consultants:1} {Document social determinants of health affecting pt's care:1} {Document your decision making why or why not admission, treatments  were needed:1} Final Clinical Impression(s) / ED Diagnoses Final diagnoses:  None    Rx / DC Orders ED Discharge Orders     None

## 2022-11-10 NOTE — ED Triage Notes (Signed)
Patient brought in via EMS from home, patient has been altered since 5pm, per family patient has had UTI symptoms 4 days (odor, color, altered). Patient bed bound and HX of right leg amputation. Patient had of LR, CBG 531, last bp 120s with EMS

## 2022-11-10 NOTE — ED Notes (Signed)
Critical labs   Glucose 939  Serum osmo 364  DO made aware

## 2022-11-11 DIAGNOSIS — E119 Type 2 diabetes mellitus without complications: Secondary | ICD-10-CM | POA: Diagnosis not present

## 2022-11-11 DIAGNOSIS — E873 Alkalosis: Secondary | ICD-10-CM | POA: Diagnosis not present

## 2022-11-11 DIAGNOSIS — I5022 Chronic systolic (congestive) heart failure: Secondary | ICD-10-CM | POA: Diagnosis present

## 2022-11-11 DIAGNOSIS — Z515 Encounter for palliative care: Secondary | ICD-10-CM | POA: Diagnosis not present

## 2022-11-11 DIAGNOSIS — Z7189 Other specified counseling: Secondary | ICD-10-CM | POA: Diagnosis not present

## 2022-11-11 DIAGNOSIS — N1831 Chronic kidney disease, stage 3a: Secondary | ICD-10-CM | POA: Diagnosis present

## 2022-11-11 DIAGNOSIS — E1100 Type 2 diabetes mellitus with hyperosmolarity without nonketotic hyperglycemic-hyperosmolar coma (NKHHC): Principal | ICD-10-CM | POA: Diagnosis present

## 2022-11-11 DIAGNOSIS — Z681 Body mass index (BMI) 19 or less, adult: Secondary | ICD-10-CM | POA: Diagnosis not present

## 2022-11-11 DIAGNOSIS — Z89611 Acquired absence of right leg above knee: Secondary | ICD-10-CM | POA: Diagnosis not present

## 2022-11-11 DIAGNOSIS — N390 Urinary tract infection, site not specified: Secondary | ICD-10-CM | POA: Diagnosis present

## 2022-11-11 DIAGNOSIS — E870 Hyperosmolality and hypernatremia: Secondary | ICD-10-CM | POA: Diagnosis present

## 2022-11-11 DIAGNOSIS — R8290 Unspecified abnormal findings in urine: Secondary | ICD-10-CM | POA: Insufficient documentation

## 2022-11-11 DIAGNOSIS — E1122 Type 2 diabetes mellitus with diabetic chronic kidney disease: Secondary | ICD-10-CM | POA: Diagnosis present

## 2022-11-11 DIAGNOSIS — E1151 Type 2 diabetes mellitus with diabetic peripheral angiopathy without gangrene: Secondary | ICD-10-CM | POA: Diagnosis present

## 2022-11-11 DIAGNOSIS — R7989 Other specified abnormal findings of blood chemistry: Secondary | ICD-10-CM

## 2022-11-11 DIAGNOSIS — F32A Depression, unspecified: Secondary | ICD-10-CM | POA: Diagnosis present

## 2022-11-11 DIAGNOSIS — D72829 Elevated white blood cell count, unspecified: Secondary | ICD-10-CM | POA: Diagnosis present

## 2022-11-11 DIAGNOSIS — F0390 Unspecified dementia without behavioral disturbance: Secondary | ICD-10-CM | POA: Diagnosis present

## 2022-11-11 DIAGNOSIS — G800 Spastic quadriplegic cerebral palsy: Secondary | ICD-10-CM | POA: Diagnosis present

## 2022-11-11 DIAGNOSIS — N179 Acute kidney failure, unspecified: Secondary | ICD-10-CM | POA: Diagnosis present

## 2022-11-11 DIAGNOSIS — Z794 Long term (current) use of insulin: Secondary | ICD-10-CM | POA: Diagnosis not present

## 2022-11-11 DIAGNOSIS — I2489 Other forms of acute ischemic heart disease: Secondary | ICD-10-CM | POA: Diagnosis present

## 2022-11-11 DIAGNOSIS — E1110 Type 2 diabetes mellitus with ketoacidosis without coma: Secondary | ICD-10-CM | POA: Diagnosis present

## 2022-11-11 DIAGNOSIS — A419 Sepsis, unspecified organism: Secondary | ICD-10-CM | POA: Diagnosis present

## 2022-11-11 DIAGNOSIS — E039 Hypothyroidism, unspecified: Secondary | ICD-10-CM | POA: Diagnosis present

## 2022-11-11 DIAGNOSIS — I130 Hypertensive heart and chronic kidney disease with heart failure and stage 1 through stage 4 chronic kidney disease, or unspecified chronic kidney disease: Secondary | ICD-10-CM | POA: Diagnosis present

## 2022-11-11 DIAGNOSIS — Z66 Do not resuscitate: Secondary | ICD-10-CM | POA: Diagnosis present

## 2022-11-11 DIAGNOSIS — E1165 Type 2 diabetes mellitus with hyperglycemia: Secondary | ICD-10-CM | POA: Diagnosis not present

## 2022-11-11 DIAGNOSIS — R64 Cachexia: Secondary | ICD-10-CM | POA: Diagnosis present

## 2022-11-11 DIAGNOSIS — E46 Unspecified protein-calorie malnutrition: Secondary | ICD-10-CM | POA: Diagnosis present

## 2022-11-11 LAB — CBG MONITORING, ED
Glucose-Capillary: >600 mg/dL (ref 70–99)
Glucose-Capillary: >600 mg/dL (ref 70–99)
Glucose-Capillary: >600 mg/dL (ref 70–99)

## 2022-11-11 LAB — GLUCOSE, CAPILLARY
Glucose-Capillary: >600 mg/dL (ref 70–99)
Glucose-Capillary: >600 mg/dL (ref 70–99)
Glucose-Capillary: 494 mg/dL — ABNORMAL HIGH (ref 70–99)
Glucose-Capillary: 422 mg/dL — ABNORMAL HIGH (ref 70–99)
Glucose-Capillary: 410 mg/dL — ABNORMAL HIGH (ref 70–99)
Glucose-Capillary: 385 mg/dL — ABNORMAL HIGH (ref 70–99)
Glucose-Capillary: 341 mg/dL — ABNORMAL HIGH (ref 70–99)
Glucose-Capillary: 388 mg/dL — ABNORMAL HIGH (ref 70–99)
Glucose-Capillary: 290 mg/dL — ABNORMAL HIGH (ref 70–99)
Glucose-Capillary: 217 mg/dL — ABNORMAL HIGH (ref 70–99)
Glucose-Capillary: 207 mg/dL — ABNORMAL HIGH (ref 70–99)
Glucose-Capillary: 223 mg/dL — ABNORMAL HIGH (ref 70–99)
Glucose-Capillary: 269 mg/dL — ABNORMAL HIGH (ref 70–99)
Glucose-Capillary: 247 mg/dL — ABNORMAL HIGH (ref 70–99)
Glucose-Capillary: 211 mg/dL — ABNORMAL HIGH (ref 70–99)
Glucose-Capillary: 199 mg/dL — ABNORMAL HIGH (ref 70–99)
Glucose-Capillary: 195 mg/dL — ABNORMAL HIGH (ref 70–99)
Glucose-Capillary: 179 mg/dL — ABNORMAL HIGH (ref 70–99)
Glucose-Capillary: 184 mg/dL — ABNORMAL HIGH (ref 70–99)
Glucose-Capillary: 179 mg/dL — ABNORMAL HIGH (ref 70–99)
Glucose-Capillary: 185 mg/dL — ABNORMAL HIGH (ref 70–99)
Glucose-Capillary: 135 mg/dL — ABNORMAL HIGH (ref 70–99)

## 2022-11-11 LAB — BASIC METABOLIC PANEL
Anion gap: 10 (ref 5–15)
Anion gap: 10 (ref 5–15)
Anion gap: 10 (ref 5–15)
Anion gap: 11 (ref 5–15)
BUN: 26 mg/dL — ABNORMAL HIGH (ref 8–23)
BUN: 25 mg/dL — ABNORMAL HIGH (ref 8–23)
BUN: 22 mg/dL (ref 8–23)
BUN: 19 mg/dL (ref 8–23)
CO2: 34 mmol/L — ABNORMAL HIGH (ref 22–32)
CO2: 33 mmol/L — ABNORMAL HIGH (ref 22–32)
CO2: 35 mmol/L — ABNORMAL HIGH (ref 22–32)
CO2: 34 mmol/L — ABNORMAL HIGH (ref 22–32)
Calcium: 9 mg/dL (ref 8.9–10.3)
Calcium: 8.9 mg/dL (ref 8.9–10.3)
Calcium: 9.1 mg/dL (ref 8.9–10.3)
Calcium: 9.2 mg/dL (ref 8.9–10.3)
Chloride: 102 mmol/L (ref 98–111)
Chloride: 104 mmol/L (ref 98–111)
Chloride: 104 mmol/L (ref 98–111)
Chloride: 107 mmol/L (ref 98–111)
Creatinine, Ser: 1.68 mg/dL — ABNORMAL HIGH (ref 0.61–1.24)
Creatinine, Ser: 1.62 mg/dL — ABNORMAL HIGH (ref 0.61–1.24)
Creatinine, Ser: 1.47 mg/dL — ABNORMAL HIGH (ref 0.61–1.24)
Creatinine, Ser: 1.28 mg/dL — ABNORMAL HIGH (ref 0.61–1.24)
GFR, Estimated: 41 mL/min — ABNORMAL LOW (ref >60)
GFR, Estimated: 42 mL/min — ABNORMAL LOW (ref >60)
GFR, Estimated: 48 mL/min — ABNORMAL LOW (ref >60)
GFR, Estimated: 56 mL/min — ABNORMAL LOW (ref >60)
Glucose, Bld: 405 mg/dL — ABNORMAL HIGH (ref 70–99)
Glucose, Bld: 310 mg/dL — ABNORMAL HIGH (ref 70–99)
Glucose, Bld: 261 mg/dL — ABNORMAL HIGH (ref 70–99)
Glucose, Bld: 193 mg/dL — ABNORMAL HIGH (ref 70–99)
Potassium: 2.1 mmol/L — CL (ref 3.5–5.1)
Potassium: 2.1 mmol/L — CL (ref 3.5–5.1)
Potassium: 3.1 mmol/L — ABNORMAL LOW (ref 3.5–5.1)
Potassium: 3.8 mmol/L (ref 3.5–5.1)
Sodium: 146 mmol/L — ABNORMAL HIGH (ref 135–145)
Sodium: 147 mmol/L — ABNORMAL HIGH (ref 135–145)
Sodium: 149 mmol/L — ABNORMAL HIGH (ref 135–145)
Sodium: 152 mmol/L — ABNORMAL HIGH (ref 135–145)

## 2022-11-11 LAB — URINALYSIS, W/ REFLEX TO CULTURE (INFECTION SUSPECTED)
Bilirubin Urine: NEGATIVE
Glucose, UA: >=500 mg/dL — AB
Ketones, ur: 20 mg/dL — AB
Leukocytes,Ua: NEGATIVE
Nitrite: NEGATIVE
Protein, ur: 100 mg/dL — AB
Specific Gravity, Urine: 1.02 (ref 1.005–1.030)
pH: 5 (ref 5.0–8.0)

## 2022-11-11 LAB — TROPONIN I (HIGH SENSITIVITY)
Troponin I (High Sensitivity): 356 ng/L (ref <18)
Troponin I (High Sensitivity): 266 ng/L (ref <18)
Troponin I (High Sensitivity): 334 ng/L (ref <18)
Troponin I (High Sensitivity): 301 ng/L (ref <18)

## 2022-11-11 LAB — CBC
HCT: 47.7 % (ref 39.0–52.0)
Hemoglobin: 15.5 g/dL (ref 13.0–17.0)
MCH: 29 pg (ref 26.0–34.0)
MCHC: 32.5 g/dL (ref 30.0–36.0)
MCV: 89.3 fL (ref 80.0–100.0)
Platelets: 243 10*3/uL (ref 150–400)
RBC: 5.34 MIL/uL (ref 4.22–5.81)
RDW: 14.1 % (ref 11.5–15.5)
WBC: 13.2 10*3/uL — ABNORMAL HIGH (ref 4.0–10.5)
nRBC: 0 % (ref 0.0–0.2)

## 2022-11-11 LAB — MAGNESIUM
Magnesium: 2.7 mg/dL — ABNORMAL HIGH (ref 1.7–2.4)
Magnesium: 2.7 mg/dL — ABNORMAL HIGH (ref 1.7–2.4)
Magnesium: 2.7 mg/dL — ABNORMAL HIGH (ref 1.7–2.4)

## 2022-11-11 LAB — RAPID URINE DRUG SCREEN, HOSP PERFORMED
Amphetamines: NOT DETECTED
Barbiturates: NOT DETECTED
Benzodiazepines: NOT DETECTED
Cocaine: NOT DETECTED
Opiates: NOT DETECTED
Tetrahydrocannabinol: NOT DETECTED

## 2022-11-11 LAB — BRAIN NATRIURETIC PEPTIDE: B Natriuretic Peptide: 325.1 pg/mL — ABNORMAL HIGH (ref 0.0–100.0)

## 2022-11-11 LAB — BETA-HYDROXYBUTYRIC ACID: Beta-Hydroxybutyric Acid: 4.6 mmol/L — ABNORMAL HIGH (ref 0.05–0.27)

## 2022-11-11 MED ORDER — INSULIN ASPART 100 UNIT/ML IJ SOLN
0.0000 [IU] | INTRAMUSCULAR | Status: DC
  Administered 2022-11-12 (×2): 1 [IU] via SUBCUTANEOUS

## 2022-11-11 MED ORDER — DEXTROSE IN LACTATED RINGERS 5 % IV SOLN: INTRAVENOUS | Status: DC

## 2022-11-11 MED ORDER — DEXTROSE 50 % IV SOLN: 0.0000 mL | INTRAVENOUS | Status: DC | PRN

## 2022-11-11 MED ORDER — SODIUM CHLORIDE 0.9 % IV SOLN: 1.0000 g | Freq: Once | INTRAVENOUS | Status: DC

## 2022-11-11 MED ORDER — LACTATED RINGERS IV SOLN: INTRAVENOUS | Status: DC

## 2022-11-11 MED ORDER — POTASSIUM CHLORIDE 10 MEQ/100ML IV SOLN
10.0000 meq | INTRAVENOUS | Status: AC
  Administered 2022-11-11 (×6): 10 meq via INTRAVENOUS
  Filled 2022-11-11 (×6): qty 100

## 2022-11-11 MED ORDER — INSULIN REGULAR(HUMAN) IN NACL 100-0.9 UT/100ML-% IV SOLN
INTRAVENOUS | Status: DC
  Administered 2022-11-11: 3 [IU]/h via INTRAVENOUS
  Filled 2022-11-11: qty 100

## 2022-11-11 MED ORDER — INSULIN GLARGINE-YFGN 100 UNIT/ML ~~LOC~~ SOLN
12.0000 [IU] | Freq: Once | SUBCUTANEOUS | Status: DC
  Filled 2022-11-11: qty 0.12

## 2022-11-11 MED ORDER — POTASSIUM CHLORIDE 10 MEQ/100ML IV SOLN
10.0000 meq | INTRAVENOUS | Status: AC
  Administered 2022-11-11 (×4): 10 meq via INTRAVENOUS
  Filled 2022-11-11 (×4): qty 100

## 2022-11-11 MED ORDER — POTASSIUM CHLORIDE 2 MEQ/ML IV SOLN
INTRAVENOUS | Status: DC
  Filled 2022-11-11 (×2): qty 1000

## 2022-11-11 MED ORDER — ENOXAPARIN SODIUM 30 MG/0.3ML IJ SOSY
30.0000 mg | PREFILLED_SYRINGE | Freq: Every day | INTRAMUSCULAR | Status: DC
  Administered 2022-11-11 – 2022-11-13 (×3): 30 mg via SUBCUTANEOUS
  Filled 2022-11-11 (×3): qty 0.3

## 2022-11-11 MED ORDER — KCL IN DEXTROSE-NACL 40-5-0.45 MEQ/L-%-% IV SOLN
INTRAVENOUS | Status: DC
  Filled 2022-11-11 (×2): qty 1000

## 2022-11-11 MED ORDER — INSULIN GLARGINE-YFGN 100 UNIT/ML ~~LOC~~ SOLN
10.0000 [IU] | Freq: Once | SUBCUTANEOUS | Status: AC
  Administered 2022-11-11: 10 [IU] via SUBCUTANEOUS
  Filled 2022-11-11: qty 0.1

## 2022-11-11 MED ORDER — LACTATED RINGERS IV BOLUS
20.0000 mL/kg | Freq: Once | INTRAVENOUS | Status: AC
  Administered 2022-11-11: 1100 mL via INTRAVENOUS

## 2022-11-11 NOTE — Progress Notes (Signed)
OT Cancellation Note  Patient Details Name: MILIANO SARDO MRN: [id number] DOB: February 13, 1941   Cancelled Treatment:    Reason Eval/Treat Not Completed: Other (comment) (Pt with start tomorrow order (8/26) and low K+ (2.1) will follow for OT evaluation as appropriate.)  Carver Fila, OTD, OTR/L SecureChat [facility] [phone]   Dalphine Handing 11/11/2022, 10:58 AM

## 2022-11-11 NOTE — Plan of Care (Signed)
  Problem: Education: Goal: Knowledge of General Education information will improve Description: Including pain rating scale, medication(s)/side effects and non-pharmacologic comfort measures Outcome: Not Progressing   Problem: Health Behavior/Discharge Planning: Goal: Ability to manage health-related needs will improve Outcome: Not Progressing   Problem: Clinical Measurements: Goal: Ability to maintain clinical measurements within normal limits will improve Outcome: Progressing Goal: Will remain free from infection Outcome: Progressing Goal: Diagnostic test results will improve Outcome: Progressing Goal: Respiratory complications will improve Outcome: Not Progressing Goal: Cardiovascular complication will be avoided Outcome: Progressing

## 2022-11-11 NOTE — Assessment & Plan Note (Signed)
EKG with multiple PVCs and troponin elevated to 356. Discussed EKG and troponin findings with on call cardiology fellow. Recommended trending troponin overnight.  - repeat EKG 0800 8/25  - trend troponin  - 24 hours cardiac monitoring  - GOC conversation with family tomorrow for pursuing ACS workup

## 2022-11-11 NOTE — Progress Notes (Signed)
   11/11/22 0700  Assess: MEWS Score  Pulse Rate 78  ECG Heart Rate 82  Resp (!) 26  SpO2 99 %  Assess: MEWS Score  MEWS Temp 0  MEWS Systolic 0  MEWS Pulse 0  MEWS RR 2  MEWS LOC 0  MEWS Score 2  MEWS Score Color Yellow  Assess: if the MEWS score is Yellow or Red  Were vital signs accurate and taken at a resting state? Yes  Does the patient meet 2 or more of the SIRS criteria? Yes  Does the patient have a confirmed or suspected source of infection? No  MEWS guidelines implemented  No, previously yellow, continue vital signs every 4 hours  Assess: SIRS CRITERIA  SIRS Temperature  0  SIRS Pulse 0  SIRS Respirations  1  SIRS WBC 1  SIRS Score Sum  2   Previously yellow, vitals obtained q2

## 2022-11-11 NOTE — ED Notes (Signed)
ED TO INPATIENT HANDOFF REPORT  ED Nurse Name and Phone #: [phone]  S Name/Age/Gender Rick Schwartz 82 y.o. male Room/Bed: 019C/019C  Code Status   Code Status: DNR  Home/SNF/Other Home Patient oriented to: self Is this baseline? No      Chief Complaint Hyperosmolar hyperglycemic state (HHS) (HCC) [E11.00]  Triage Note Patient brought in via EMS from home, patient has been altered since 5pm, per family patient has had UTI symptoms 4 days (odor, color, altered). Patient bed bound and HX of right leg amputation. Patient had of LR, CBG 531, last bp 120s with EMS   Allergies No Known Allergies  Level of Care/Admitting Diagnosis ED Disposition     ED Disposition  Admit   Condition  --   Comment  Hospital Area: [facility] [[id number]]  Level of Care: Progressive [102]  Admit to Progressive based on following criteria: GI, ENDOCRINE disease patients with GI bleeding, acute liver failure or pancreatitis, stable with diabetic ketoacidosis or thyrotoxicosis (hypothyroid) state.  May admit patient to [name] or [facility] if equivalent level of care is available:: No  Covid Evaluation: Asymptomatic - no recent exposure (last 10 days) testing not required  Diagnosis: Hyperosmolar hyperglycemic state (HHS) [facility]) [[id number]]  Admitting Physician: Glendale Chard [[id number]]  Attending Physician: Caro Laroche [[id number]]  Certification:: I certify this patient will need inpatient services for at least 2 midnights  Expected Medical Readiness: 11/14/2022          B Medical/Surgery History Past Medical History:  Diagnosis Date   Acute respiratory failure with hypoxemia (HCC)    Aspiration pneumonia (HCC) 01/20/2021   Bradycardia 02/08/2021   Cancer (HCC)    Prostate   Cerebral palsy (HCC)    Community acquired pneumonia    Cough 01/21/2012   COVID-19 11/22/2020   Hypercalcemia 07/18/2020   Hyperlipidemia    Hypernatremia 07/18/2020    Hypertension    Peripheral arterial disease (HCC)    critical limb ischemia   SBO (small bowel obstruction) (HCC)    Sepsis (HCC) 07/18/2020   Past Surgical History:  Procedure Laterality Date   AMPUTATION Right 07/22/2020   Procedure: RIGHT ABOVE KNEE AMPUTATION;  Surgeon: Nadara Mustard, MD;  Location: [facility] OR;  Service: Orthopedics;  Laterality: Right;   PROSTATE SURGERY       A IV Location/Drains/Wounds Patient Lines/Drains/Airways Status     Active Line/Drains/Airways     Name Placement date Placement time Site Days   Peripheral IV 11/10/22 20 G Left Antecubital 11/10/22  2246  Antecubital  1   Peripheral IV 11/11/22 20 G Left;Posterior Forearm 11/11/22  0044  Forearm  less than 1   Peripheral IV 11/11/22 20 G Left;Posterior Hand 11/11/22  0107  Hand  less than 1   External Urinary Catheter 01/20/21  1410  --  660   Pressure Injury 09/21/20 Heel Left Stage 3 -  Full thickness tissue loss. Subcutaneous fat may be visible but bone, tendon or muscle are NOT exposed. Red, pale with scar tissue 09/21/20  1000  -- 781   Pressure Injury 01/23/21 Scrotum Lower Stage 2 -  Partial thickness loss of dermis presenting as a shallow open injury with a red, pink wound bed without slough. Two areas of Stage 2 01/23/21  0500  -- 657            Intake/Output Last 24 hours No intake or output data in the 24 hours ending 11/11/22 0153  Labs/Imaging Results  for orders placed or performed during the hospital encounter of 11/10/22 (from the past 48 hour(s))  Comprehensive metabolic panel     Status: Abnormal   Collection Time: 11/10/22 10:46 PM  Result Value Ref Range   Sodium 142 135 - 145 mmol/L   Potassium 3.0 (L) 3.5 - 5.1 mmol/L    Comment: HEMOLYSIS AT THIS LEVEL MAY AFFECT RESULT   Chloride 87 (L) 98 - 111 mmol/L   CO2 29 22 - 32 mmol/L   Glucose, Bld 939 (HH) 70 - 99 mg/dL    Comment: CRITICAL RESULT CALLED TO, READ BACK BY AND VERIFIED WITH Jerardo Costabile RN 11/10/22 2349 M  KOROLESKI Glucose reference range applies only to samples taken after fasting for at least 8 hours.    BUN 31 (H) 8 - 23 mg/dL   Creatinine, Ser 0.45 (H) 0.61 - 1.24 mg/dL   Calcium 9.2 8.9 - 40.9 mg/dL   Total Protein 6.4 (L) 6.5 - 8.1 g/dL   Albumin 2.8 (L) 3.5 - 5.0 g/dL   AST 33 15 - 41 U/L    Comment: HEMOLYSIS AT THIS LEVEL MAY AFFECT RESULT   ALT 21 0 - 44 U/L    Comment: HEMOLYSIS AT THIS LEVEL MAY AFFECT RESULT   Alkaline Phosphatase 70 38 - 126 U/L   Total Bilirubin 2.6 (H) 0.3 - 1.2 mg/dL    Comment: HEMOLYSIS AT THIS LEVEL MAY AFFECT RESULT   GFR, Estimated 31 (L) >60 mL/min    Comment: (NOTE) Calculated using the CKD-EPI Creatinine Equation (2021)    Anion gap 26 (H) 5 - 15    Comment: ELECTROLYTES REPEATED TO VERIFY Performed at [facility], [address] [place], [place] [address]   CBC with Differential     Status: Abnormal   Collection Time: 11/10/22 10:46 PM  Result Value Ref Range   WBC 12.3 (H) 4.0 - 10.5 K/uL   RBC 5.61 4.22 - 5.81 MIL/uL   Hemoglobin 15.9 13.0 - 17.0 g/dL   HCT 47.8 29.5 - 62.1 %   MCV 88.4 80.0 - 100.0 fL   MCH 28.3 26.0 - 34.0 pg   MCHC 32.1 30.0 - 36.0 g/dL   RDW 30.8 65.7 - 84.6 %   Platelets 300 150 - 400 K/uL   nRBC 0.0 0.0 - 0.2 %   Neutrophils Relative % 86 %   Neutro Abs 10.6 (H) 1.7 - 7.7 K/uL   Lymphocytes Relative 6 %   Lymphs Abs 0.7 0.7 - 4.0 K/uL   Monocytes Relative 7 %   Monocytes Absolute 0.8 0.1 - 1.0 K/uL   Eosinophils Relative 0 %   Eosinophils Absolute 0.0 0.0 - 0.5 K/uL   Basophils Relative 0 %   Basophils Absolute 0.0 0.0 - 0.1 K/uL   Immature Granulocytes 1 %   Abs Immature Granulocytes 0.07 0.00 - 0.07 K/uL    Comment: Performed at [facility], [address] [place], [place] [address]  Beta-hydroxybutyric acid     Status: Abnormal   Collection Time: 11/10/22 10:46 PM  Result Value Ref Range   Beta-Hydroxybutyric Acid 4.60 (H) 0.05 - 0.27 mmol/L    Comment: RESULT CONFIRMED BY MANUAL  DILUTION Performed at [facility], [address] [place], [place] [address]   Osmolality     Status: Abnormal   Collection Time: 11/10/22 10:46 PM  Result Value Ref Range   Osmolality 364 (HH) 275 - 295 mOsm/kg    Comment: REPEATED TO  VERIFY CRITICAL RESULT CALLED TO, READ BACK BY AND VERIFIED WITH: Lanayah Gartley,N RN 2350 11/10/22 AMIREHSANI F Performed at [facility], [address], [place], [place] [address]   Magnesium     Status: Abnormal   Collection Time: 11/10/22 10:46 PM  Result Value Ref Range   Magnesium 3.1 (H) 1.7 - 2.4 mg/dL    Comment: Performed at [facility], [address], [place], [place] [address]  Protime-INR     Status: Abnormal   Collection Time: 11/10/22 10:46 PM  Result Value Ref Range   Prothrombin Time 17.2 (H) 11.4 - 15.2 seconds   INR 1.4 (H) 0.8 - 1.2    Comment: (NOTE) INR goal varies based on device and disease states. Performed at [facility], [address] [place], [place] [address]   POC CBG, ED     Status: Abnormal   Collection Time: 11/10/22 11:19 PM  Result Value Ref Range   Glucose-Capillary >600 (HH) 70 - 99 mg/dL    Comment: Glucose reference range applies only to samples taken after fasting for at least 8 hours.  Urine rapid drug screen (hosp performed)     Status: None   Collection Time: 11/10/22 11:35 PM  Result Value Ref Range   Opiates NONE DETECTED NONE DETECTED   Cocaine NONE DETECTED NONE DETECTED   Benzodiazepines NONE DETECTED NONE DETECTED   Amphetamines NONE DETECTED NONE DETECTED   Tetrahydrocannabinol NONE DETECTED NONE DETECTED   Barbiturates NONE DETECTED NONE DETECTED    Comment: (NOTE) DRUG SCREEN FOR MEDICAL PURPOSES ONLY.  IF CONFIRMATION IS NEEDED FOR ANY PURPOSE, NOTIFY LAB WITHIN 5 DAYS.  LOWEST DETECTABLE LIMITS FOR URINE DRUG SCREEN Drug Class                     Cutoff (ng/mL) Amphetamine and metabolites    1000 Barbiturate and metabolites    200 Benzodiazepine                  200 Opiates and metabolites        300 Cocaine and metabolites        300 THC                            50 Performed at [facility] Lab, [address] [place], [place] [address]   Urinalysis, w/ Reflex to Culture (Infection Suspected) -Urine, Catheterized     Status: Abnormal   Collection Time: 11/10/22 11:35 PM  Result Value Ref Range   Specimen Source URINE, CATHETERIZED    Color, Urine YELLOW YELLOW   APPearance CLOUDY (A) CLEAR   Specific Gravity, Urine 1.020 1.005 - 1.030   pH 5.0 5.0 - 8.0   Glucose, UA >=500 (A) NEGATIVE mg/dL   Hgb urine dipstick LARGE (A) NEGATIVE   Bilirubin Urine NEGATIVE NEGATIVE   Ketones, ur 20 (A) NEGATIVE mg/dL   Protein, ur 474 (A) NEGATIVE mg/dL   Nitrite NEGATIVE NEGATIVE   Leukocytes,Ua NEGATIVE NEGATIVE   RBC / HPF 11-20 0 - 5 RBC/hpf   WBC, UA 6-10 0 - 5 WBC/hpf    Comment:        Reflex urine culture not performed if WBC <=10, OR if Squamous epithelial cells >5. If Squamous epithelial cells >5 suggest recollection. Reflex urine culture not performed if WBC <=10, OR if Squamous epithelial cells >5. If Squamous epithelial cells >5, suggest recollection. CORRECTED ON 08/25  AT 0016: PREVIOUSLY REPORTED AS 6 10        Reflex urine culture not performed if WBC <=10, OR if Squamous epithelial cells >5. If Squamous epithelial cells >5 suggest recollection.    Bacteria, UA MANY (A) NONE SEEN   Squamous Epithelial / HPF 0-5 0 - 5 /HPF   Mucus PRESENT    Granular Casts, UA PRESENT     Comment: Performed at [facility] Lab, [address] [place], [place] [address]  I-Stat venous blood gas, [facility] ED, [facility], [facility])     Status: Abnormal   Collection Time: 11/10/22 11:43 PM  Result Value Ref Range   pH, Ven 7.416 7.25 - 7.43   pCO2, Ven 58.3 44 - 60 mmHg   pO2, Ven 56 (H) 32 - 45 mmHg   Bicarbonate 37.4 (H) 20.0 - 28.0 mmol/L   TCO2 39 (H) 22 - 32 mmol/L   O2 Saturation 88 %   Acid-Base Excess 10.0 (H) 0.0 - 2.0 mmol/L   Sodium 141  135 - 145 mmol/L   Potassium 2.1 (LL) 3.5 - 5.1 mmol/L   Calcium, Ion 1.02 (L) 1.15 - 1.40 mmol/L   HCT 49.0 39.0 - 52.0 %   Hemoglobin 16.7 13.0 - 17.0 g/dL   Sample type VENOUS    Comment NOTIFIED PHYSICIAN   Brain natriuretic peptide     Status: Abnormal   Collection Time: 11/10/22 11:55 PM  Result Value Ref Range   B Natriuretic Peptide 325.1 (H) 0.0 - 100.0 pg/mL    Comment: Performed at [facility] Lab, [address] [place], [place] [address]  Troponin I (High Sensitivity)     Status: Abnormal   Collection Time: 11/10/22 11:55 PM  Result Value Ref Range   Troponin I (High Sensitivity) 356 (HH) <18 ng/L    Comment: CRITICAL RESULT CALLED TO, READ BACK BY AND VERIFIED WITH Maliik Karner RN 11/11/22 0105 M KOROLESKI (NOTE) Elevated high sensitivity troponin I (hsTnI) values and significant  changes across serial measurements may suggest ACS but many other  chronic and acute conditions are known to elevate hsTnI results.  Refer to the "Links" section for chest pain algorithms and additional  guidance. Performed at [facility], [address], [place], [place] [address]   CBG monitoring, ED     Status: Abnormal   Collection Time: 11/11/22  1:00 AM  Result Value Ref Range   Glucose-Capillary >600 (HH) 70 - 99 mg/dL    Comment: Glucose reference range applies only to samples taken after fasting for at least 8 hours.  CBG monitoring, ED     Status: Abnormal   Collection Time: 11/11/22  1:35 AM  Result Value Ref Range   Glucose-Capillary >600 (HH) 70 - 99 mg/dL    Comment: Glucose reference range applies only to samples taken after fasting for at least 8 hours.   CT HEAD WO CONTRAST  Result Date: 11/11/2022 CLINICAL DATA:  Altered mental status EXAM: CT HEAD WITHOUT CONTRAST TECHNIQUE: Contiguous axial images were obtained from the base of the skull through the vertex without intravenous contrast. RADIATION DOSE REDUCTION: This exam was performed according to the  departmental dose-optimization program which includes automated exposure control, adjustment of the mA and/or kV according to patient size and/or use of iterative reconstruction technique. COMPARISON:  07/08/2020 FINDINGS: Brain: Diffuse cerebral atrophy. 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 No acute infarct, hemorrhage or hydrocephalus. Vascular: No hyperdense  vessel or unexpected calcification. Skull: No acute calvarial abnormality. 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 Atrophy. No acute intracranial abnormality. Electronically Signed   By: Charlett Nose M.D.   On: 11/11/2022 00:06   DG Chest Port 1 View  Result Date: 11/10/2022 CLINICAL DATA:  Altered level of consciousness. EXAM: PORTABLE CHEST 1 VIEW COMPARISON:  Chest radiograph 06/13/2022 FINDINGS: Patient's hand obscures assessment of the right lung base. Mild chronic elevation of right hemidiaphragm. Stable heart size and mediastinal contours. No evidence of focal airspace disease. Small right pleural effusion is similar. Scattered clips project over the right and left left hemithorax as before. No pulmonary edema. IMPRESSION: 1. Small right pleural effusion, similar to prior exam. 2. No acute findings. Electronically Signed   By: Narda Rutherford M.D.   On: 11/10/2022 23:33    Pending Labs Unresulted Labs (From admission, onward)     Start     Ordered   11/11/22 0133  Hemoglobin A1c  (Glycemic Control (SSI)  Q 4 Hours / Glycemic Control (SSI)  AC +/- HS)  Once,   R       Comments: To assess prior glycemic control    11/11/22 0136            Vitals/Pain Today's Vitals   11/10/22 2219 11/10/22 2227 11/10/22 2244 11/11/22 0152  BP: (!) 197/172  (!) 128/58 121/78  Pulse: (!) 51  92 (!) 101  Resp: (!) 25   (!) 30  Temp: (!) 97.4 F  (36.3 C)   97.8 F (36.6 C)  TempSrc: Oral   Oral  SpO2: 99%   98%  Weight:  55 kg    Height:  5\' 6"  (1.676 m)      Isolation Precautions No active isolations  Medications Medications  insulin regular, human (MYXREDLIN) 100 units/ 100 mL infusion (3 Units/hr Intravenous New Bag/Given 11/11/22 0104)  lactated ringers infusion (has no administration in time range)  dextrose 5 % in lactated ringers infusion (has no administration in time range)  dextrose 50 % solution 0-50 mL (has no administration in time range)  potassium chloride 10 mEq in 100 mL IVPB (10 mEq Intravenous New Bag/Given 11/11/22 0108)  enoxaparin (LOVENOX) injection 30 mg (has no administration in time range)  sodium chloride 0.9 % bolus 1,000 mL (0 mLs Intravenous Hold 11/11/22 0600)  lactated ringers bolus 1,100 mL (1,100 mLs Intravenous New Bag/Given 11/11/22 0043)    Mobility manual wheelchair     Focused Assessments  Hyperglycemia Initial 939      If patient is a Neuro Trauma and patient is going to OR before floor call report to 4N Charge nurse: [phone] or [phone]   R Recommendations: See Admitting Provider Note  Report given to:   Additional Notes: Patient is nonverbal, shakes head to yes/no questions

## 2022-11-11 NOTE — Assessment & Plan Note (Signed)
With UA negative for acute infection, patient denying UTI symptoms and VS stable will elect to hold antibiotics.  - recheck CBC 8/26 - monitor fever curve

## 2022-11-11 NOTE — Progress Notes (Signed)
Pt arrived to the unit from the ED via stretcher He is nonverbal and he was unaccompanied by family  Pt is unable to answer any admission questions so I will notify oncoming staff nurse to address admission completion with family Pt has no s/s or c/o pain or discomfort No s/s or c/o sob/dyspnea to note while on O2 Via Nasal cannula Skin is apparently c/d/I and protective foam was placed on the buttocks He is incontinent of bowel/bladder so purewick has been placed as well Iv insulin is infusing as per orders Iv potassium orders are still in place, pt has received 1 out of 4 bags, so remaining bags will be hung accordingly I will continue to monitor for changes in status Bed in proper position for safety Bed alarm in use

## 2022-11-11 NOTE — Progress Notes (Addendum)
FMTS Brief Progress Note  S:Seen at bedside with Dr. Sharion Dove. Patient is awake and alert, attempts to answer questions but speech is very difficult to understand. Does not appear distressed.    O: BP 104/72 (BP Location: Left Arm)   Pulse 70   Temp (!) 97.4 F (36.3 C) (Oral)   Resp (!) 25   Ht 5\' 6"  (1.676 m)   Wt 52.8 kg   SpO2 100%   BMI 18.79 kg/m   Gen: Cachectic and frail, alert and answers my questions, though speech is very difficult to understand. It does seem this is an improvement to the admission exam where he was documentated as being non-verbal.   A/P: We are in a bit of a challenging position here. Patient was started on endotool for his HHS and his blood glucose has been quite appropriate. He does not have an increased anion gap and would otherwise be a candidate for transitioning off of endotool and onto subcutaneous insulin. Ideally would like to get him off endotool and off of the IV fluids he is getting. He has a history of HFrEF (though last echo in March with mid-range EF 45-50%) and is thus at risk of volume overload and further complications.  However, it is difficult to know whether he will be able to eat/drink. It sounds like he has had declining PO intake for the past several weeks prior to this acute illness.  - Will ask RN to perform bedside swallow eval - If he is able to maintain at least some oral intake, we can transition him off of endotool.  - May ultimately need SLP eval - If he is not able to take PO, may need to further engage family around [place] discussions. Suspect he would certainly be a candidate for hospice care given advanced dementia and multiple comorbidities.  - Continue q6h BMP while on endotool, replete K as appropriate    ADDENDUM 2122 Patient unfortunately failed bedside swallow eval. Per nursing mental status essentially unchanged from earlier.  I have reservations about the fluid load he is getting with the present Endotool  orders and given that his HHS is essentially cleared, I do not think he is deriving any metabolic benefit. Will therefore transition him off of IV insulin. However, given that he will remain NPO due to mental status, will keep D5 running at maintenance rate. Favor starting with relatively low dose Semglee. Seems he's needing ~2-3 units of insulin/hr on endotool. While NPO will start with just 10 units of Semglee.  - maintain NPO - Stop IV insulin infusion - 10 units Semglee  - Decrease D5 to maintenance rate  - Continue q4h CBGs - Monitor mental status - SLP eval in the morning  - Repeat BMP now and in the morning, replete lytes as needed  Alicia Amel, MD 11/11/2022, 7:43 PM PGY-3, [facility] Night Resident  Please page [phone] with questions.

## 2022-11-11 NOTE — Hospital Course (Addendum)
Rick Schwartz is a 82 y.o.male with a history of  cerebral palsy, PE, HFrEF who was admitted to the [facility] Teaching Service at [facility] for altered mental status, limited PO intake and found in a hyperosmolar hyperglycemic state. His hospital course is detailed below:  Hyperosmolar hyperglycemic state (HHS) (HCC) Patient's blood sugar was elevated at 939, potassium 3.0, pH of 7.4, bicarb 37.4, beta Hydroxybutyric 4.6 and an anion gap of 26 and A1c of 10.6. The patient was started on IV fluids and  IV insulin. After gap closed x 2, the patient was transitioned off IV insulin onto 10 units of Semglee. In the setting of severe dysphagia limited p.o. intake, semglee was held and his IV fluids were adjusted according to labs and maintained. Blood sugars were managed on sliding scale insulin. Goals of care conversation occurred with sister(guardian), [name], who felt comfortable managing patients diabetes at home. Discontinued IV fluids prior to discharge and the patient was tolerating pured diet.  Patients regimen was metformin 500 mg, jardiance 25 mg and semglee 10 units at discharge.  The patient was intermittently on various sliding scale insulins and fluid resuscitation due to inconsistent oral intake. Glucose levels in range at discharge.    Dysphagia/Malnutrition Patient failed bedside swallow study, after transitioning off IV insulin.  Speech pathology was consulted for evaluation and recommended a pured solids and nectar thick liquids.  Discussed difficulty swallowing with guardian, who states patient was on a pured diet at home.  Barium swallow study was notable for severe dysphagia and recommended speech language therapy with home health. Discussion was had with guardian about challenges with treating hypernatremia in the setting of his decreased p.o. intake and difficulty swallowing. He is most likely chronically aspirating. Palliative was consulted in the setting of decreased oral intake and severe  dysphagia, and the patient's guardian desired to do home health for swallowing and would consider outpatient palliative services.   Elevated troponin EKG with multiple PVCs and troponin elevated to 356, discussed EKG and troponin findings with on call cardiology fellow who recommended trending trops.  Last trop was 301, suspected to be due to demand ischemia in setting of elevated blood glucose.   Electrolyte Abnormalities  Patient was intermittently hypokalemic throughout hospitalization and they were repleted as needed via IV or oral medication.  Patient was also hypernatremic after transitioning to p.o. metformin, jardiance and sliding scale insulin.  The patient was resuscitated with fluids throughout admission.    Other chronic conditions were medically managed with home medications and formulary alternatives as necessary (cerebral palsy, PAD, HTN, HLD, PE, HFrEF) Eliquis 5 mg BID  Lasix 20 mg daily (held)  K-lor 10 mEq daily (held)  Crestor 20 mg daily (held)   PCP Follow-up Recommendations: Repeat UA at follow up for hematuria.  Follow up BMP, to assess electrolytes  Follow up blood sugars and diabetic regimen with new found diabetes.  Consider discontinuing crestor at this point as may not benefit much from mortality.

## 2022-11-11 NOTE — H&P (Signed)
Hospital Admission History and Physical Service Pager: [phone]  Patient name: Rick Schwartz Medical record number: [id number] Date of Birth: 08-11-1940 Age: 82 y.o. Gender: male  Primary Care Provider: Vonna Drafts, MD Consultants: None  Code Status: DNR, per on file AD documents and confirmed with sister [name] on 8/25.  Preferred Emergency Contact:  Contact Information   None on File    Other Contacts     Name Relation Home Work Mobile   Pinon Sister [phone]     Caydyn, Fieser [phone]     Berneta Sages   [phone]   Kederick, Debarge Niece   [phone]       Chief Complaint: AMS 2/2 to HHS  Assessment and Plan: Rick Schwartz is a 82 y.o. male presenting with AMS x4 days. PMHx significant for cerebral palsy, Hx of prostate cancer, PAD, irregular heart rhythm, Hx of PE 2022 on Eliquis, HFrEF, dementia, HLD, HTN.  Differential for presentation of this includes HHS possibly secondary to acute infection, worsening heart disease, or history of prediabetes that has been diet controlled. Infection less likely as patient is afebrile with mildly elevated WBC to 12.3, no other signs of sepsis present and he denies UTI symptoms. Possibly triggered by ACS with elevated troponin but EKG appears to be consistent with prior. PE less likely as patient has been compliant on Eliquis BID.   Hyperosmolar hyperglycemic state (HHS) (HCC) Hx of prediabetes, last A1c 5.5 in 2022. Possibly triggered by progressive worsening of glycemic control.  - Admit to FMTS, attending Dr. Linwood Dibbles  - Progressive, Vital signs per floor - NPO on Endotool  - Endotool per protocol  - Labs Q4H  - Consult to diabetic coordinator  - A1c pending  - PT/OT to treat - VTE prophylaxis Lovenox while NPO, consider transitioning back to Eliquis 5 mg BID  - Fall precautions - Delirium precautions   Elevated troponin EKG with multiple PVCs and troponin elevated to 356.  Discussed EKG and troponin findings with on call cardiology fellow. Recommended trending troponin overnight.  - repeat EKG 0800 8/25  - trend troponin  - 24 hours cardiac monitoring  - GOC conversation with family tomorrow for pursuing ACS workup   Protein-calorie malnutrition (HCC) Patient cachetic on exam.  - consult to RD once transitioned off endotool  - consider SLP consult   Foul smelling urine With UA negative for acute infection, patient denying UTI symptoms and VS stable will elect to hold antibiotics.  - recheck CBC 8/26 - monitor fever curve     Chronic and Stable Problems:  Cerebral Palsy: wheelchair bound, nonverbal but able to communicate with 1-2 words at baseline  History of PE in 2022: Hold Eliquis 5 mg BID, GOC conversation on whether to continue  Hypokalemia: Hold K-lor 10 mEq while on Endotool Hyperlipidemia/PAD: Continue Crestor when able to take PO HTN: sister reports patient has not been taking Losartan 25 mg daily. Hold for now and consider restarting  HFrEF: hold Lasix while on Endotool, daily weights    FEN/GI: NPO, on endotool  VTE Prophylaxis: Lovenox while NPO and discussing GOC  Disposition: Progressive, Attending Dr. Linwood Dibbles   History of Present Illness:  Rick Schwartz is a 82 y.o. male presenting with 4 days of AMS. His sister, [name], was able to provide history by phone. She reports he has not been eating well and looking more lethargic over the last several days prompting her to call EMS. She also noticed his urine having a strong  smell and him urinating more frequently. He denied pain to her and to me on interview.   At baseline, he is communicative to 1-2 words, wheelchair bound with contractures from CP.   He is compliant on his home Eliquis, K-lor, Crestor, and Lasix. She reports he received his last dose of medications yesterday. She denies history of diabetes or ever needing to be on medication.   In the ED, patient was afebrile with  stable BP. Labs were significant for glucose >900, pH 7.4, Bicarb 37.4, UA + glucose and ketones, BHA 4.6. CXR consistent with prior imaging with no signs of pneumonia, CT head negative for acute abnormality. EKG showing multiple PVCs consistent with prior readings. Troponin elevated to 356 and trending with cardiac monitoring. Endotool started in the ED.   Review Of Systems: Per HPI with the following additions: as above  Pertinent Past Medical History: Prostate cancer PAD Cerebral palsy  HTN HLD PE 2022 on Eliquis  Remainder reviewed in history tab.   Pertinent Past Surgical History: S/P Right AKA Prostate surgery   Remainder reviewed in history tab.   Pertinent Social History: Tobacco use: former, quit 30 years ago per sister  Alcohol use: former, quit 30 years per sister  Other Substance use: never Lives with sister [name] who is his primary care taker   Pertinent Family History: Mother: diabetes, HTN Father: HTN  Sister: diabetes  Remainder reviewed in history tab.   Important Outpatient Medications: Tylenol 325 mg PRN Eliquis 5 mg BID Lasix 20 mg daily  K-lor 10 mEq daily  Crestor 20 mg daily  Remainder reviewed in medication history.   Objective: BP 121/78 (BP Location: Right Arm)   Pulse (!) 101   Temp 97.8 F (36.6 C) (Oral)   Resp (!) 30   Ht 5\' 6"  (1.676 m)   Wt 55 kg   SpO2 98%   BMI 19.57 kg/m  Exam: General: cachetic appearing, wheelchair bound at baseline, responds to voice and touch, s/p right AKA.  Eyes: extraocular motion intact  Cardiovascular: irregular irregular rhythm on exam, no murmurs appreciated  Respiratory: no increased work of breathing, no focal consolidations on exam  Gastrointestinal: bowel sounds present, soft, non tender, no hepatosplenomegaly  MSK: poor muscle tone with diffuse atrophy, grip strength 4/5 on left, right hand w/ contracture  Derm: skin in good condition with no apparent rashes, left heel without pressure  injury, patient refused sacral check, chronic large 3-4 cm mass located on right forehead  Neuro: nods head appropriately to yes/no questions, difficult to assess mentation with non-verbal status, Pupils equal and reactive to light, can follow commands.   Labs:  CBC BMET  Recent Labs  Lab 11/10/22 2246 11/10/22 2343  WBC 12.3*  --   HGB 15.9 16.7  HCT 49.6 49.0  PLT 300  --    Recent Labs  Lab 11/10/22 2246 11/10/22 2343  NA 142 141  K 3.0* 2.1*  CL 87*  --   CO2 29  --   BUN 31*  --   CREATININE 2.08*  --   GLUCOSE 939*  --   CALCIUM 9.2  --     Pertinent additional labs: Troponin 356 UA: + ketones, Hgb, protein  UDS: negative  BHA: 4.6  VBG: pH 7.4, pCO2 58.3, PO2 56, Bicarb 37.4   EKG:    Imaging Studies Performed:  CXR:  IMPRESSION: 1. Small right pleural effusion, similar to prior exam. 2. No acute findings.  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 Atrophy. No acute intracranial abnormality  Glendale Chard, DO 11/11/2022, 2:33 AM PGY-2, [facility]  FPTS Intern pager: [phone], text pages welcome Secure chat group [facility] Teaching Service

## 2022-11-11 NOTE — Assessment & Plan Note (Addendum)
Hx of prediabetes, last A1c 5.5 in 2022. Possibly triggered by progressive worsening of glycemic control.  - Admit to FMTS, attending Dr. Linwood Dibbles  - Progressive, Vital signs per floor - NPO on Endotool  - Endotool per protocol  - Labs Q4H  - Consult to diabetic coordinator  - A1c pending  - PT/OT to treat - VTE prophylaxis Lovenox while NPO, consider transitioning back to Eliquis 5 mg BID  - Fall precautions - Delirium precautions

## 2022-11-11 NOTE — Assessment & Plan Note (Signed)
Patient cachetic on exam.  - consult to RD once transitioned off endotool  - consider SLP consult

## 2022-11-12 ENCOUNTER — Other Ambulatory Visit (HOSPITAL_COMMUNITY): Payer: Self-pay

## 2022-11-12 DIAGNOSIS — E1100 Type 2 diabetes mellitus with hyperosmolarity without nonketotic hyperglycemic-hyperosmolar coma (NKHHC): Secondary | ICD-10-CM | POA: Diagnosis not present

## 2022-11-12 LAB — CBC
HCT: 46.6 % (ref 39.0–52.0)
Hemoglobin: 14.6 g/dL (ref 13.0–17.0)
MCH: 28.2 pg (ref 26.0–34.0)
MCHC: 31.3 g/dL (ref 30.0–36.0)
MCV: 90 fL (ref 80.0–100.0)
Platelets: 207 10*3/uL (ref 150–400)
RBC: 5.18 MIL/uL (ref 4.22–5.81)
RDW: 14.4 % (ref 11.5–15.5)
WBC: 15 10*3/uL — ABNORMAL HIGH (ref 4.0–10.5)
nRBC: 0 % (ref 0.0–0.2)

## 2022-11-12 LAB — BASIC METABOLIC PANEL
Anion gap: 16 — ABNORMAL HIGH (ref 5–15)
Anion gap: 9 (ref 5–15)
BUN: 19 mg/dL (ref 8–23)
BUN: 20 mg/dL (ref 8–23)
CO2: 21 mmol/L — ABNORMAL LOW (ref 22–32)
CO2: 27 mmol/L (ref 22–32)
Calcium: 9.4 mg/dL (ref 8.9–10.3)
Calcium: 8.4 mg/dL — ABNORMAL LOW (ref 8.9–10.3)
Chloride: 114 mmol/L — ABNORMAL HIGH (ref 98–111)
Chloride: 109 mmol/L (ref 98–111)
Creatinine, Ser: 1.37 mg/dL — ABNORMAL HIGH (ref 0.61–1.24)
Creatinine, Ser: 1.23 mg/dL (ref 0.61–1.24)
GFR, Estimated: 52 mL/min — ABNORMAL LOW (ref >60)
GFR, Estimated: 59 mL/min — ABNORMAL LOW (ref >60)
Glucose, Bld: 291 mg/dL — ABNORMAL HIGH (ref 70–99)
Glucose, Bld: 378 mg/dL — ABNORMAL HIGH (ref 70–99)
Potassium: 5.9 mmol/L — ABNORMAL HIGH (ref 3.5–5.1)
Potassium: 3.2 mmol/L — ABNORMAL LOW (ref 3.5–5.1)
Sodium: 151 mmol/L — ABNORMAL HIGH (ref 135–145)
Sodium: 145 mmol/L (ref 135–145)

## 2022-11-12 LAB — GLUCOSE, CAPILLARY
Glucose-Capillary: 150 mg/dL — ABNORMAL HIGH (ref 70–99)
Glucose-Capillary: 169 mg/dL — ABNORMAL HIGH (ref 70–99)
Glucose-Capillary: 226 mg/dL — ABNORMAL HIGH (ref 70–99)
Glucose-Capillary: 238 mg/dL — ABNORMAL HIGH (ref 70–99)
Glucose-Capillary: 254 mg/dL — ABNORMAL HIGH (ref 70–99)
Glucose-Capillary: 274 mg/dL — ABNORMAL HIGH (ref 70–99)
Glucose-Capillary: 282 mg/dL — ABNORMAL HIGH (ref 70–99)
Glucose-Capillary: 375 mg/dL — ABNORMAL HIGH (ref 70–99)

## 2022-11-12 LAB — HEMOGLOBIN A1C
Hgb A1c MFr Bld: 10.6 % — ABNORMAL HIGH (ref 4.8–5.6)
Mean Plasma Glucose: 258 mg/dL

## 2022-11-12 MED ORDER — LIVING WELL WITH DIABETES BOOK
Freq: Once | Status: AC
  Filled 2022-11-12: qty 1

## 2022-11-12 MED ORDER — INSULIN ASPART 100 UNIT/ML IJ SOLN
0.0000 [IU] | Freq: Three times a day (TID) | INTRAMUSCULAR | Status: DC
  Administered 2022-11-12: 9 [IU] via SUBCUTANEOUS
  Administered 2022-11-13: 3 [IU] via SUBCUTANEOUS
  Administered 2022-11-13: 9 [IU] via SUBCUTANEOUS
  Administered 2022-11-13: 5 [IU] via SUBCUTANEOUS
  Administered 2022-11-14: 7 [IU] via SUBCUTANEOUS
  Administered 2022-11-14: 2 [IU] via SUBCUTANEOUS
  Administered 2022-11-14: 3 [IU] via SUBCUTANEOUS
  Administered 2022-11-15: 9 [IU] via SUBCUTANEOUS

## 2022-11-12 MED ORDER — INSULIN ASPART 100 UNIT/ML IJ SOLN
3.0000 [IU] | Freq: Once | INTRAMUSCULAR | Status: AC
  Administered 2022-11-12: 3 [IU] via SUBCUTANEOUS

## 2022-11-12 MED ORDER — POTASSIUM CHLORIDE 10 MEQ/100ML IV SOLN
10.0000 meq | INTRAVENOUS | Status: AC
  Administered 2022-11-12 – 2022-11-13 (×4): 10 meq via INTRAVENOUS
  Filled 2022-11-12 (×4): qty 100

## 2022-11-12 MED ORDER — SODIUM CHLORIDE 0.45 % IV SOLN: INTRAVENOUS | Status: DC

## 2022-11-12 NOTE — Inpatient Diabetes Management (Addendum)
Inpatient Diabetes Program Recommendations  AACE/ADA: New Consensus Statement on Inpatient Glycemic Control (2015)  Target Ranges:  Prepandial:   less than 140 mg/dL      Peak postprandial:   less than 180 mg/dL (1-2 hours)      Critically ill patients:  140 - 180 mg/dL   Lab Results  Component Value Date   GLUCAP 238 (H) 11/12/2022   HGBA1C 10.6 (H) 11/10/2022    Review of Glycemic Control  Latest Reference Range & Units 11/12/22 04:39 11/12/22 09:02 11/12/22 11:17  Glucose-Capillary 70 - 99 mg/dL 295 (H) 621 (H) 308 (H)  (H): Data is abnormally high  Diabetes history: DM2 Outpatient Diabetes medications: None Current orders for Inpatient glycemic control: transitioned of of IV insulin to Semglee 10 units given last night 22:23 and Novolog 0-9 units TID  Inpatient Diabetes Program Recommendations:    Please consider Semglee 10 units BID.  Spoke with sister at bedside.  He lives with a different sister who is not currently ay bedside.  Spoke with pt about new diagnosis. Discussed A1C results with her and explained what an A1C is, basic pathophysiology of DM Type 2, basic home care, basic diabetes diet nutrition principles, importance of checking CBGs and maintaining good CBG control to prevent long-term and short-term complications. Reviewed signs and symptoms of hyperglycemia and hypoglycemia and how to treat hypoglycemia at home. Also reviewed blood sugar goals at home.  RNs to provide ongoing basic DM education at bedside with this patient. Have ordered educational booklet, insulin starter kit.  Sister and niece state most of the family has DM2.  Rick Schwartz drinks a lot of juice and like [employer].  Discussed importance of avoiding caloric beverages and sticking to water and diet or zero sugar beverages.   Educated on The Plate Method, CHO's, portion control, CBGs at home fasting and mid afternoon, F/U with PCP every 3 months, bring meter to PCP office, long and short term  complications of uncontrolled BG.  Ordered the LWWD booklet.    Consider discharging on Metformin.  Asked for benefit check on insulins-Lantus and Novolog are covered at 100%.  His sister who he lives with is familiar with the insulin pen; she used to administer insulin to her mother.  They are aware of signs and symptoms of hypoglycemia and treatments.    Will continue to follow while inpatient.  Thank you, Dulce Sellar, MSN, CDCES Diabetes Coordinator Inpatient Diabetes Program [phone] (team pager from 8a-5p)

## 2022-11-12 NOTE — Assessment & Plan Note (Signed)
EKG with multiple PVCs and troponin elevated to 356>>> 301. Discussed EKG and troponin findings with on call cardiology fellow. Recommended trending troponin overnight.  - 24 hours cardiac monitoring  - GOC conversation with family today for pursuing ACS workup

## 2022-11-12 NOTE — Assessment & Plan Note (Addendum)
Hx of prediabetes, last A1c 5.5 in 2022. Possibly triggered by progressive worsening of glycemic control. Off IV insulin and transitioned to subcutaneous 10 units of semglee insulin w/ D5. Will keep on 10cc/hr  of D5 1/2 NS  and 40 mEq kcl given limited PO intake.  - BS 270s-130s last 24 hours FSBS 169.  - FMTS, attending Dr. Linwood Dibbles  - Progressive, Vital signs per floor - Currently on 10 cc/hr D5 1/2 NS w/ K  - Semglee 10 units discontinued - Changed SSI to sensitive - A1c 10.6 - GOC w/ sister  - Consult to diabetic coordinator  - PT/OT to treat - Fall precautions - Delirium precautions

## 2022-11-12 NOTE — Assessment & Plan Note (Addendum)
Patient cachetic on exam.  - SLP consult, recs puree diet - consult RD

## 2022-11-12 NOTE — Plan of Care (Signed)
  Problem: Clinical Measurements: Goal: Will remain free from infection Outcome: Progressing Goal: Diagnostic test results will improve Outcome: Progressing   

## 2022-11-12 NOTE — Evaluation (Signed)
Clinical/Bedside Swallow Evaluation Patient Details  Name: Rick Schwartz MRN: [id number] Date of Birth: 01/10/1941  Today's Date: 11/12/2022 Time: SLP Start Time (ACUTE ONLY): 1010 SLP Stop Time (ACUTE ONLY): 1024 SLP Time Calculation (min) (ACUTE ONLY): 14 min  Past Medical History:  Past Medical History:  Diagnosis Date   Acute respiratory failure with hypoxemia (HCC)    Aspiration pneumonia (HCC) 01/20/2021   Bradycardia 02/08/2021   Cancer (HCC)    Prostate   Cerebral palsy (HCC)    Community acquired pneumonia    Cough 01/21/2012   COVID-19 11/22/2020   Hypercalcemia 07/18/2020   Hyperlipidemia    Hypernatremia 07/18/2020   Hypertension    Peripheral arterial disease (HCC)    critical limb ischemia   SBO (small bowel obstruction) (HCC)    Sepsis (HCC) 07/18/2020   Past Surgical History:  Past Surgical History:  Procedure Laterality Date   AMPUTATION Right 07/22/2020   Procedure: RIGHT ABOVE KNEE AMPUTATION;  Surgeon: Nadara Mustard, MD;  Location: [facility] OR;  Service: Orthopedics;  Laterality: Right;   PROSTATE SURGERY     HPI:  Rick Schwartz is a 82 y.o. male presenting with AMS x4 days. CXR 8/24: "1. Small right pleural effusion, similar to prior exam.  2. No acute findings."  Head CT 8/25 with no acute findings.  Pt consumes puree diet at baseline. PMHx significant for cerebral palsy, Hx of prostate cancer, PAD, irregular heart rhythm, Hx of PE 2022 on Eliquis, HFrEF, dementia, HLD, HTN.    Assessment / Plan / Recommendation  Clinical Impression  Pt presents with a moderate oral dysphagia with hx of spastic CP.  Pt tolerated straw sips of water with no clinical s/s of aspiration.  There was some anterior loss from R side with all consistencies.  Pt unable to obtain good oral closure with POs.  Pt acheived good oral clearance of puree.  With simulated ground texture there was prolonged oral prep and portions of bolus removed with suction.  Pt's sister arrived during  assessment and confirmed baseline diet is puree.  She believes he takes meds crushed, but their other sister is is regular caretaker.  Pt with clear CXR on arrival.  He appears to have been tolerating baseline diet well and did not exhibit any overt signs of aspiration during today's assessment.    Recommend puree diet with thin liquids.  SLP Visit Diagnosis: Dysphagia, oral phase (R13.11)    Aspiration Risk  Mild aspiration risk    Diet Recommendation Dysphagia 1 (Puree);Thin liquid    Liquid Administration via: Straw Medication Administration: Crushed with puree Supervision: Staff to assist with self feeding (1:1 assistance)    Other  Recommendations Oral Care Recommendations: Oral care BID    Recommendations for follow up therapy are one component of a multi-disciplinary discharge planning process, led by the attending physician.  Recommendations may be updated based on patient status, additional functional criteria and insurance authorization.  Follow up Recommendations No SLP follow up      Assistance Recommended at Discharge  N/A  Functional Status Assessment Patient has had a recent decline in their functional status and demonstrates the ability to make significant improvements in function in a reasonable and predictable amount of time.  Frequency and Duration min 1 x/week  1 week       Prognosis Prognosis for improved oropharyngeal function:  (N/A, at baseline)      Swallow Study   General Date of Onset: 11/11/22 HPI: Rick Schwartz is  a 82 y.o. male presenting with AMS x4 days. CXR 8/24: "1. Small right pleural effusion, similar to prior exam.  2. No acute findings."  Head CT 8/25 with no acute findings.  Pt consumes puree diet at baseline. PMHx significant for cerebral palsy, Hx of prostate cancer, PAD, irregular heart rhythm, Hx of PE 2022 on Eliquis, HFrEF, dementia, HLD, HTN. Type of Study: Bedside Swallow Evaluation Previous Swallow Assessment: Most recent clinical  eval 01/23/21  with recs for puree and thin Diet Prior to this Study: NPO Temperature Spikes Noted: No Respiratory Status: Nasal cannula History of Recent Intubation: No Behavior/Cognition: Cooperative;Pleasant mood Oral Cavity Assessment: Dry Oral Care Completed by SLP: No Oral Cavity - Dentition: Edentulous Self-Feeding Abilities: Total assist Patient Positioning: Upright in bed Baseline Vocal Quality:  (Harsh, with dysarthria)    Oral/Motor/Sensory Function Overall Oral Motor/Sensory Function: Moderate impairment Facial ROM: Reduced right Facial Symmetry: Abnormal symmetry right Facial Strength: Reduced right Lingual ROM:  (could not test, suspected reduced) Lingual Symmetry:  (could not test) Lingual Strength:  (could not test) Velum:  (could not visualize) Mandible:  (reduced ROM)   Ice Chips Ice chips: Not tested   Thin Liquid Thin Liquid: Within functional limits Presentation: Straw    Nectar Thick Nectar Thick Liquid: Impaired Oral phase functional implications: Right anterior spillage   Honey Thick Honey Thick Liquid: Not tested   Puree Puree: Within functional limits   Solid     Solid: Impaired Oral Phase Functional Implications: Oral residue Other Comments: with simulated ground consistency      Kerrie Pleasure, MA, CCC-SLP Acute Rehabilitation Services Office: [phone] 11/12/2022,10:42 AM

## 2022-11-12 NOTE — Assessment & Plan Note (Addendum)
With UA negative for acute infection, patient denying UTI symptoms and VS stable will elect to hold antibiotics.  - AM CBC pending   - monitor fever curve

## 2022-11-12 NOTE — Evaluation (Signed)
Physical Therapy Evaluation Patient Details Name: Rick Schwartz MRN: [id number] DOB: [birth year] Today's Date: 11/12/2022  History of Present Illness  82 yo male presents to [facility] on 8/24 with AMS, workup for UTI negative, working diagnosis of hyperosmolar hyperglycemic state. PMH spastic quadriplegic cerebral palsy, HTN, BPH, protein calorie malnutrition, h/o prostate cancer, prediabetes, dementia, HFrEF.  Clinical Impression   Pt presents with debility, total care for ADLs and mobility which is pt baseline. Pt tolerated EOB sitting x5 minutes but was total care to perform, pt soiled in stool so PT assisted with clean up. Pt with no further acute or post-acute PT needs at this time given pt is total care, would recommend RN staff perform PROM as tolerated, repositioning every 2 hours, and chair positioning for pulmonary health. PT to sign off.          If plan is discharge home, recommend the following: Two people to help with bathing/dressing/bathroom;Two people to help with walking and/or transfers;Direct supervision/assist for medications management;Direct supervision/assist for financial management;Help with stairs or ramp for entrance;Assist for transportation   Can travel by private vehicle        Equipment Recommendations None recommended by PT  Recommendations for Other Services       Functional Status Assessment Patient has not had a recent decline in their functional status     Precautions / Restrictions Precautions Precautions: Fall Precaution Comments: at risk for breakdown, bed-level, 3LO2 Restrictions Weight Bearing Restrictions: No      Mobility  Bed Mobility Overal bed mobility: Needs Assistance Bed Mobility: Rolling, Sidelying to Sit, Sit to Sidelying Rolling: Total assist Sidelying to sit: Total assist     Sit to sidelying: Total assist General bed mobility comments: total assist all aspects, rolling bilat for pericare as pt soiled in stool and able to  maintain sidelying without physical assist    Transfers                   General transfer comment: nt - transfers via lift    Ambulation/Gait                  Stairs            Wheelchair Mobility     Tilt Bed    Modified Rankin (Stroke Patients Only)       Balance Overall balance assessment: Needs assistance Sitting-balance support: Feet unsupported, Bilateral upper extremity supported Sitting balance-Leahy Scale: Zero Sitting balance - Comments: total assist to sit EOB x5 minutes Postural control: Posterior lean                                   Pertinent Vitals/Pain Pain Assessment Pain Assessment: Faces Faces Pain Scale: Hurts little more Pain Location: generalized grimacing with mobility Pain Descriptors / Indicators: Grimacing    Home Living Family/patient expects to be discharged to:: Private residence Living Arrangements: Other relatives (sister and caregiver) Available Help at Discharge: Family;Available 24 hours/day Type of Home: House Home Access: Level entry       Home Layout: One level Home Equipment: Wheelchair - manual;Hospital bed;BSC/3in1;Tub bench      Prior Function Prior Level of Function : Needs assist             Mobility Comments: information above gathered via chart review, called pt's sister via telephone this date and no answer. Per chart review, pt is total care at this time  and has a hoyer lift ADLs Comments: total care     Extremity/Trunk Assessment   Upper Extremity Assessment Upper Extremity Assessment: RUE deficits/detail;LUE deficits/detail RUE Deficits / Details: flexor contracture RUE LUE Deficits / Details: able to extend LUE with min assist, lacking 10 deg elbow extension and flexor contractures worse in 3rd and 4th fingers    Lower Extremity Assessment Lower Extremity Assessment: Generalized weakness;RLE deficits/detail RLE Deficits / Details: R AKA    Cervical / Trunk  Assessment Cervical / Trunk Assessment: Kyphotic;Other exceptions Cervical / Trunk Exceptions: zero trunk control  Communication   Communication Communication: Difficulty communicating thoughts/reduced clarity of speech  Cognition Arousal: Alert Behavior During Therapy: Flat affect Overall Cognitive Status: Difficult to assess                                 General Comments: responds yes/no consistently, oriented to self, follows one-step mobility commands as able given deficits. Otherwise difficult to assess        General Comments General comments (skin integrity, edema, etc.): RR 20s-40s, SPO2 88% on 3LO2 sitting EOB, soiled in stool. HR 100-112 bpm    Exercises     Assessment/Plan    PT Assessment Patient does not need any further PT services  PT Problem List         PT Treatment Interventions      PT Goals (Current goals can be found in the Care Plan section)  Acute Rehab PT Goals PT Goal Formulation: All assessment and education complete, DC therapy Time For Goal Achievement: 11/12/22 Potential to Achieve Goals: Fair    Frequency       Co-evaluation               AM-PAC PT "6 Clicks" Mobility  Outcome Measure Help needed turning from your back to your side while in a flat bed without using bedrails?: Total Help needed moving from lying on your back to sitting on the side of a flat bed without using bedrails?: Total Help needed moving to and from a bed to a chair (including a wheelchair)?: Total Help needed standing up from a chair using your arms (e.g., wheelchair or bedside chair)?: Total Help needed to walk in hospital room?: Total Help needed climbing 3-5 steps with a railing? : Total 6 Click Score: 6    End of Session Equipment Utilized During Treatment: Oxygen Activity Tolerance: Patient limited by fatigue Patient left: in bed;with call bell/phone within reach;with bed alarm set;with nursing/sitter in room;Other (comment) (SLP at  bedside) Nurse Communication: Mobility status;Other (comment) (total care, had a BM, needs a sacral mepilex) PT Visit Diagnosis: Other abnormalities of gait and mobility (R26.89);Muscle weakness (generalized) (M62.81)    Time: 1610-9604 PT Time Calculation (min) (ACUTE ONLY): 28 min   Charges:   PT Evaluation $PT Eval Low Complexity: 1 Low PT Treatments $Therapeutic Activity: 8-22 mins PT General Charges $$ ACUTE PT VISIT: 1 Visit         Marye Round, PT DPT Acute Rehabilitation Services Secure Chat Preferred  Office [phone]   Shakthi Scipio Sheliah Plane 11/12/2022, 10:42 AM

## 2022-11-12 NOTE — Progress Notes (Signed)
Occupational Therapy Evaluation  Sister and niece in room and they state Rick Schwartz requires total care with all mobility and ADL. Family physically lifts him to his recliner daily and assist with all ADL tasks. Pt with sever flexion contracture R hand interfering with hand hygiene- family "does the best they can". MASD noted R anticubital fossa - would benefit from Interdry. L hand limited in ROM at baseline - encouraged family to complete daily ROM and position in extension as able. Not moving L foot - at risk for skin breakdown - family states pt has a "boot pillow" for his L foot at home. Encouraged them to being in his boot. Elevated/floated heel on pillow. No further OT needed at this time.     11/12/22 1200  OT Visit Information  Last OT Received On 11/12/22  Assistance Needed +2  Reason Eval/Treat Not Completed  (Pt with start tomorrow order (8/26) and low K+ (2.1) will follow for OT evaluation as appropriate.)  History of Present Illness 82 yo male presents to [facility] on 8/24 with AMS, workup for UTI negative, working diagnosis of hyperosmolar hyperglycemic state. PMH spastic quadriplegic cerebral palsy, HTN, BPH, protein calorie malnutrition, h/o prostate cancer, prediabetes, dementia, HFrEF.  Precautions  Precautions Fall  Precaution Comments at risk for breakdown, bed-level, 3LO2  Restrictions  Weight Bearing Restrictions No  Home Living  Family/patient expects to be discharged to: Private residence  Living Arrangements Other relatives (sister and caregiver)  Available Help at Discharge Family;Available 24 hours/day  Type of Home House  Home Access Level entry  Home Layout One level  Bathroom Shower/Tub Tub/shower unit  Home Equipment Wheelchair - manual;Hospital bed;BSC/3in1;Tub bench  Prior Function  Prior Level of Function  Needs assist  Mobility Comments Family lifts Rick Schwartz to the recliner daily  ADLs Comments total care  Pain Assessment  Pain Assessment Faces  Faces Pain Scale 4   Breathing 0  Negative Vocalization 0  Facial Expression 0  Body Language 0  Consolability 0  PAINAD Score 0  Pain Location generalized grimacing with movement  Pain Descriptors / Indicators Grimacing  Pain Intervention(s) Limited activity within patient's tolerance  Cognition  Arousal Alert  Behavior During Therapy Flat affect  Overall Cognitive Status Difficult to assess  General Comments responds yes/no consistently, oriented to self, follows one-step mobility commands; sister states this is baseline  Difficult to assess due to Impaired communication  Communication  Communication Difficulty communicating thoughts/reduced clarity of speech  Upper Extremity Assessment  RUE Deficits / Details flexor contracture RUE  LUE Deficits / Details able to extend LUE with min assist, lacking 10 deg elbow extension and flexor contractures worse in 3rd and 4th fingers  Lower Extremity Assessment  RLE Deficits / Details R AKA  Cervical / Trunk Assessment  Cervical / Trunk Assessment Kyphotic;Other exceptions  ADL  Overall ADL's  At baseline (total A)  Bed Mobility  Overal bed mobility Needs Assistance  Bed Mobility Rolling;Sidelying to Sit;Sit to Sidelying  Rolling Total assist  Sidelying to sit Total assist  Sit to sidelying Total assist  General bed mobility comments total assist all aspects, rolling bilat for pericare as pt soiled in stool and able to maintain sidelying without physical assist  Transfers  General transfer comment nt - transfers via lift  Balance  Overall balance assessment Needs assistance  Sitting-balance support Feet unsupported;Bilateral upper extremity supported  Sitting balance-Leahy Scale Zero  General Comments  General comments (skin integrity, edema, etc.) L foot on mattress; elevated on pillow  OT - End of Session  Activity Tolerance Patient tolerated treatment well  Patient left in bed;with call bell/phone within reach;with family/visitor present  Nurse  Communication Other (comment) (total care)  OT Assessment  OT Recommendation/Assessment Patient does not need any further OT services  OT Visit Diagnosis Muscle weakness (generalized) (M62.81)  OT Problem List Decreased strength;Decreased range of motion;Decreased activity tolerance;Decreased coordination;Decreased cognition;Decreased safety awareness  AM-PAC OT "6 Clicks" Daily Activity Outcome Measure (Version 2)  Help from another person eating meals? 1  Help from another person taking care of personal grooming? 1  Help from another person toileting, which includes using toliet, bedpan, or urinal? 1  Help from another person bathing (including washing, rinsing, drying)? 1  Help from another person to put on and taking off regular upper body clothing? 1  Help from another person to put on and taking off regular lower body clothing? 1  6 Click Score 6  Progressive Mobility  What is the highest level of mobility based on the progressive mobility assessment? Level 1 (Bedfast) - Unable to balance while sitting on edge of bed  Mobility Referral No  Activity Moved into chair position in bed  OT Recommendation  Follow Up Recommendations No OT follow up  Patient can return home with the following Two people to help with walking and/or transfers;A lot of help with bathing/dressing/bathroom;Assistance with feeding;Assistance with cooking/housework;Direct supervision/assist for medications management;Direct supervision/assist for financial management;Assist for transportation;Help with stairs or ramp for entrance  Functional Status Assessent Patient has not had a recent decline in their functional status  OT Time Calculation  OT Start Time (ACUTE ONLY) 1204  OT Stop Time (ACUTE ONLY) 1220  OT Time Calculation (min) 16 min  OT General Charges  $OT Visit 1 Visit  OT Evaluation  $OT Eval Low Complexity 1 Low  Drucella Karbowski, OT/L   Acute OT Clinical Specialist Acute Rehabilitation Services Pager  [phone] Office [phone]

## 2022-11-12 NOTE — Progress Notes (Signed)
Mobility Specialist Progress Note:    11/12/22 1528  Mobility  Activity Moved into chair position in bed;Turned to right side;Turned to left side  Level of Assistance +2 (takes two people)  Assistive Device None  Activity Response Tolerated fair  Mobility Specialist Start Time (ACUTE ONLY) 1500  Mobility Specialist Stop Time (ACUTE ONLY) 1520  Mobility Specialist Time Calculation (min) (ACUTE ONLY) 20 min   Assisted NT in cleaning up pt post BM. Pt needed max verbal cues for turning left to right. Pt able to initiate turn but needed ModA in continuing the roll.  Instructed to bend their L knee when on their side pt was able to do it slightly, needed ModA. Left w/ NT in room. All needs met.  Degan Grayer Mobility Specialist  Please contact vis Secure Chat or  Rehab Office [phone]

## 2022-11-12 NOTE — TOC Benefit Eligibility Note (Signed)
Patient Product/process development scientist completed.    The patient is insured through [facility]. Patient has Medicare and is not eligible for a copay card, but may be able to apply for patient assistance, if available.    Ran test claim for Lantus Pen and the current 30 day co-pay is $0.00.  Ran test claim for Humalog KwikPen and the current 30 day co-pay is $0.00.  This test claim was processed through [facility] copay amounts may vary at other pharmacies due to pharmacy/plan contracts, or as the patient moves through the different stages of their insurance plan.     Roland Earl, CPHT Pharmacy Technician III Certified Patient Advocate [facility] Patient Advocate Team Direct Number: [phone]  Fax: [phone]

## 2022-11-12 NOTE — Progress Notes (Signed)
FMTS Brief Progress Note  S:Seen at bedside with Dr. Sharion Dove. Patient denies any complaints at present. Reviewed care with bedside RN who denied any concerns. Unfortunately though, PO intake remains poor. Sounds like he has declined food and fluids on several occasions.    O: BP 118/65 (BP Location: Left Arm)   Pulse 90   Temp 98.3 F (36.8 C) (Oral)   Resp 18   Ht 5\' 6"  (1.676 m)   Wt 55.6 kg   SpO2 98%   BMI 19.78 kg/m   Gen: Chronically ill and frail elderly patient. Engaged in answering questions.  Cardio: RRR, no murmur Pulm: Normal WOB on 3L [place]  A/P: HSS Resolved. Mentation is improved today even compared to when I saw him last night. Speech is much easier to understand and he responds more quickly. Last CBGs 282>254>226. Last BMP was quite appropriate with Na 145, K 3.2 (has been repleted), Cr 1.23, and AG 9.  - Holding on further long-acting insulin for now, await fasting CBG in the morning - Continue 1/2 NS at 50 mL/hr. May need to uptitrate back to maintenance rate should PO intake remain poor. Will also consider transition to isotonic fluids pending am chemistry.  - As before, if his PO intake remains the limiting factor, may need further GOC discussions and consideration of hospice transition  - BMP in the morning     Alicia Amel, MD 11/12/2022, 7:37 PM PGY-3, [facility] Night Resident  Please page [phone] with questions.

## 2022-11-12 NOTE — Progress Notes (Signed)
Daily Progress Note Intern Pager: [phone]  Patient name: Rick Schwartz Medical record number: [id number] Date of birth: 1940-07-01 Age: 82 y.o. Gender: male  Primary Care Provider: Vonna Drafts, MD Consultants: Palliative  Code Status: DNR   Pt Overview and Major Events to Date:  8/25 Admitted and endotool d/c'ed  8/26 Changed SSI to sensitive, puree diet, holding basal insulin, GOC w/ sister  Assessment and Plan: Rick Schwartz is a 82 y.o. with a PMHX of cerebal palsy, HFrEF 45-50%, PE in 2022 on eliquis, HTN, and DM who was admitted for HHS. Transitioned off endo tool yesterday and given 10 units of LAI. Speech evaluated for swallow study and recommended puree diet. Will hold night time basal and maintain blood sugars with sensitive SSI. Will have goals of care with sister.  Assessment & Plan Hyperosmolar hyperglycemic state (HHS) (HCC) Hx of prediabetes, last A1c 5.5 in 2022. Possibly triggered by progressive worsening of glycemic control. Off IV insulin and transitioned to subcutaneous 10 units of semglee insulin w/ D5. Will keep on 10cc/hr  of D5 1/2 NS  and 40 mEq kcl given limited PO intake.  - BS 270s-130s last 24 hours FSBS 169.  - FMTS, attending Dr. Linwood Dibbles  - Progressive, Vital signs per floor - Currently on 10 cc/hr D5 1/2 NS w/ K  - Semglee 10 units discontinued - Changed SSI to sensitive - A1c 10.6 - GOC w/ sister  - Consult to diabetic coordinator  - PT/OT to treat - Fall precautions - Delirium precautions  Elevated troponin EKG with multiple PVCs and troponin elevated to 356>>> 301. Discussed EKG and troponin findings with on call cardiology fellow. Recommended trending troponin overnight.  - 24 hours cardiac monitoring  - GOC conversation with family today for pursuing ACS workup  Protein-calorie malnutrition (HCC) Patient cachetic on exam.  - SLP consult, recs puree diet - consult RD  Foul smelling urine With UA negative for acute infection,  patient denying UTI symptoms and VS stable will elect to hold antibiotics.  - AM CBC pending   - monitor fever curve    FEN/GI: DYS 1 fluid consistency   PPx: Lovenox Dispo:Home pending clinical improvement . Barriers include PO intake and goals of care.   Subjective:  Discussed yes or no questions. Patient able to swallow at home. Only wanted water this morning.   Objective: Temp:  [97.4 F (36.3 C)-97.9 F (36.6 C)] 97.5 F (36.4 C) (08/26 0317) Pulse Rate:  [63-100] 74 (08/26 0317) Resp:  [16-36] 16 (08/26 0317) BP: (91-124)/(54-76) 114/76 (08/26 0317) SpO2:  [96 %-100 %] 98 % (08/26 0317) Weight:  [122 lb 9.2 oz (55.6 kg)] 122 lb 9.2 oz (55.6 kg) (08/26 0317)  Physical Exam: General: cachetic, No acute distress Cardiovascular: Irregular rhythym, Respiratory: NWOB Abdomen: soft, non tender  Neuro: Minimally verbal, alert to voice,  Laboratory: Most recent CBC Lab Results  Component Value Date   WBC 13.2 (H) 11/11/2022   HGB 15.5 11/11/2022   HCT 47.7 11/11/2022   MCV 89.3 11/11/2022   PLT 243 11/11/2022   Most recent BMP    Latest Ref Rng & Units 11/11/2022   10:15 PM  BMP  Glucose 70 - 99 mg/dL 563   BUN 8 - 23 mg/dL 19   Creatinine 8.75 - 1.24 mg/dL 6.43   Sodium 329 - 518 mmol/L 152   Potassium 3.5 - 5.1 mmol/L 3.8   Chloride 98 - 111 mmol/L 107   CO2 22 -  32 mmol/L 34   Calcium 8.9 - 10.3 mg/dL 9.2     Other pertinent labs   AM CBGS 150>169> 274>238   Imaging/Diagnostic Tests: None Peterson Ao, MD 11/12/2022, 7:41 AM  PGY-1, [facility] Family Medicine FPTS Intern pager: [phone], text pages welcome Secure chat group [facility] Teaching Service

## 2022-11-13 DIAGNOSIS — E1100 Type 2 diabetes mellitus with hyperosmolarity without nonketotic hyperglycemic-hyperosmolar coma (NKHHC): Secondary | ICD-10-CM | POA: Diagnosis not present

## 2022-11-13 LAB — GLUCOSE, CAPILLARY
Glucose-Capillary: 205 mg/dL — ABNORMAL HIGH (ref 70–99)
Glucose-Capillary: 214 mg/dL — ABNORMAL HIGH (ref 70–99)
Glucose-Capillary: 228 mg/dL — ABNORMAL HIGH (ref 70–99)
Glucose-Capillary: 232 mg/dL — ABNORMAL HIGH (ref 70–99)
Glucose-Capillary: 245 mg/dL — ABNORMAL HIGH (ref 70–99)
Glucose-Capillary: 290 mg/dL — ABNORMAL HIGH (ref 70–99)
Glucose-Capillary: 322 mg/dL — ABNORMAL HIGH (ref 70–99)
Glucose-Capillary: 391 mg/dL — ABNORMAL HIGH (ref 70–99)

## 2022-11-13 LAB — CBC
HCT: 42.4 % (ref 39.0–52.0)
Hemoglobin: 13.4 g/dL (ref 13.0–17.0)
MCH: 28.8 pg (ref 26.0–34.0)
MCHC: 31.6 g/dL (ref 30.0–36.0)
MCV: 91.2 fL (ref 80.0–100.0)
Platelets: 163 10*3/uL (ref 150–400)
RBC: 4.65 MIL/uL (ref 4.22–5.81)
RDW: 14.9 % (ref 11.5–15.5)
WBC: 9 10*3/uL (ref 4.0–10.5)
nRBC: 0 % (ref 0.0–0.2)

## 2022-11-13 LAB — BASIC METABOLIC PANEL
Anion gap: 7 (ref 5–15)
BUN: 16 mg/dL (ref 8–23)
CO2: 28 mmol/L (ref 22–32)
Calcium: 8.4 mg/dL — ABNORMAL LOW (ref 8.9–10.3)
Chloride: 111 mmol/L (ref 98–111)
Creatinine, Ser: 1.17 mg/dL (ref 0.61–1.24)
GFR, Estimated: >60 mL/min (ref >60)
Glucose, Bld: 270 mg/dL — ABNORMAL HIGH (ref 70–99)
Potassium: 3.1 mmol/L — ABNORMAL LOW (ref 3.5–5.1)
Sodium: 146 mmol/L — ABNORMAL HIGH (ref 135–145)

## 2022-11-13 MED ORDER — POTASSIUM CHLORIDE 20 MEQ PO PACK
40.0000 meq | PACK | Freq: Once | ORAL | Status: AC
  Administered 2022-11-13: 40 meq via ORAL
  Filled 2022-11-13: qty 2

## 2022-11-13 MED ORDER — EMPAGLIFLOZIN 10 MG PO TABS
10.0000 mg | ORAL_TABLET | Freq: Every day | ORAL | Status: DC
  Administered 2022-11-13 – 2022-11-14 (×2): 10 mg via ORAL
  Filled 2022-11-13 (×2): qty 1

## 2022-11-13 MED ORDER — APIXABAN 5 MG PO TABS
5.0000 mg | ORAL_TABLET | Freq: Two times a day (BID) | ORAL | Status: DC
  Administered 2022-11-14 – 2022-11-17 (×7): 5 mg via ORAL
  Filled 2022-11-13 (×7): qty 1

## 2022-11-13 MED ORDER — ROSUVASTATIN CALCIUM 20 MG PO TABS
20.0000 mg | ORAL_TABLET | Freq: Every day | ORAL | Status: DC
  Administered 2022-11-13 – 2022-11-17 (×5): 20 mg via ORAL
  Filled 2022-11-13 (×5): qty 1

## 2022-11-13 MED ORDER — METFORMIN HCL ER 500 MG PO TB24
500.0000 mg | ORAL_TABLET | Freq: Every day | ORAL | Status: DC
  Administered 2022-11-14 – 2022-11-17 (×4): 500 mg via ORAL
  Filled 2022-11-13 (×4): qty 1

## 2022-11-13 NOTE — Assessment & Plan Note (Deleted)
EKG with multiple PVCs and troponin elevated to 356>>> 301. Discussed EKG and troponin findings with on call cardiology fellow. Recommended trending troponin overnight.  - 24 hours cardiac monitoring

## 2022-11-13 NOTE — Plan of Care (Signed)
  Problem: Elimination: Goal: Will not experience complications related to bowel motility Outcome: Progressing   

## 2022-11-13 NOTE — Plan of Care (Signed)
  Problem: Elimination: Goal: Will not experience complications related to bowel motility 11/13/2022 0713 by Antionette Char, RN Outcome: Not Progressing 11/13/2022 0712 by Antionette Char, RN Outcome: Progressing

## 2022-11-13 NOTE — Progress Notes (Signed)
Speech Language Pathology Treatment: Dysphagia  Patient Details Name: Rick Schwartz MRN: [id number] DOB: 1940-07-22 Today's Date: 11/13/2022 Time: 4259-5638 SLP Time Calculation (min) (ACUTE ONLY): 28 min  Assessment / Plan / Recommendation Clinical Impression  Pt seen at bedside during lunch to assess tolerance of Puree/thin liquid diet. Pt tolerates puree consistencies with mild anterior leakage noted. Cough response noted following thin liquid trials. No cough noted following nectar thick liquid. Nursing reports difficult recently with thin liquids. Will continue puree solids and downgrade liquids to nectar thick. Pt requires full assist with PO intake. SLP will continue to follow to assess diet tolerance and determine if/when instrumental assessment is needed.   HPI HPI: VAUGHAN MOWAT is a 82 y.o. male presenting with AMS x4 days. CXR 8/24: "1. Small right pleural effusion, similar to prior exam.  2. No acute findings."  Head CT 8/25 with no acute findings.  Pt consumes puree diet at baseline. PMHx significant for cerebral palsy, Hx of prostate cancer, PAD, irregular heart rhythm, Hx of PE 2022 on Eliquis, HFrEF, dementia, HLD, HTN.      SLP Plan  Continue with current plan of care      Recommendations for follow up therapy are one component of a multi-disciplinary discharge planning process, led by the attending physician.  Recommendations may be updated based on patient status, additional functional criteria and insurance authorization.    Recommendations  Diet recommendations: Dysphagia 1 (puree);Nectar-thick liquid Liquids provided via: Straw Medication Administration: Crushed with puree Supervision: Full supervision/cueing for compensatory strategies;Trained caregiver to feed patient Compensations: Minimize environmental distractions;Slow rate;Small sips/bites;Follow solids with liquid Postural Changes and/or Swallow Maneuvers: Seated upright 90 degrees;Upright 30-60 min after  meal        Oral care BID     Dysphagia, unspecified (R13.10)     Continue with current plan of care    Caedence Snowden B. Murvin Natal, [facility], CCC-SLP Speech Language Pathologist Office: [phone]  Leigh Aurora 11/13/2022, 12:21 PM

## 2022-11-13 NOTE — Discharge Instructions (Addendum)
Dear Rick Schwartz,   Thank you so much for allowing [place] to be part of your care!  You were admitted to [facility] for altered mental status and discovered to have high blood sugars.  You were started on IV insulin and transition to insulin shots prior to discharge.  Labs showed that you are now a diabetic and will require medications to help control blood sugars.  You met with a diabetic specialist while admitted for tips to live with your diabetes.  Please take blood sugar medications metformin, jardiance and insulin as prescribed and discussed with your primary doctor.  Due to your limited food and drink intake speech therapist recommended home health speech services at discharge.  Please continue to pure food and drink thick nectar like fluids to decrease likelihood of aspiration.  You were seen by our inpatient palliative service, to discuss goals of care please follow-up with them outpatient to continue conversations.   POST-HOSPITAL & CARE INSTRUCTIONS Follow-up Diabetes regimen with PCP  Please let PCP/Specialists know of any changes that were made.  Please see medications section of this packet for any medication changes.   DOCTOR'S APPOINTMENT & FOLLOW UP CARE INSTRUCTIONS  Future Appointments  Date Time Provider Department Center  11/23/2022  9:30 AM Bess Kinds, MD [facility]  09/27/2023  8:45 AM [facility] ANNUAL WELLNESS VISIT [facility]  Follow-up Diabetes regimen with PCP  Continue with HH speech therapy, to assess difficulty swallowing and make recommendations for diet  Continue taking your home potassium medications Discontinue lasix, to not worsen dehydration symptoms    RETURN PRECAUTIONS: If you are light headed or have altered mental status or are confused.   Take care and be well!  Family Medicine Teaching Service  [place]  [facility]  [address] [place], [place] [address] [phone]

## 2022-11-13 NOTE — Inpatient Diabetes Management (Signed)
Inpatient Diabetes Program Recommendations  AACE/ADA: New Consensus Statement on Inpatient Glycemic Control (2015)  Target Ranges:  Prepandial:   less than 140 mg/dL      Peak postprandial:   less than 180 mg/dL (1-2 hours)      Critically ill patients:  140 - 180 mg/dL   Lab Results  Component Value Date   GLUCAP 232 (H) 11/13/2022   HGBA1C 10.6 (H) 11/10/2022    Review of Glycemic Control  Latest Reference Range & Units 11/12/22 20:07 11/12/22 22:17 11/13/22 00:31 11/13/22 02:04 11/13/22 04:08 11/13/22 07:44  Glucose-Capillary 70 - 99 mg/dL 829 (H) 562 (H) 130 (H) 228 (H) 245 (H) 232 (H)  (H): Data is abnormally high  Diabetes history: DM2 Outpatient Diabetes medications: None Current orders for Inpatient glycemic control: Novolog 0-9 units TID  Inpatient Diabetes Program Recommendations:    Please consider:  Semglee 10 units every day  Will continue to follow while inpatient.  Thank you, Dulce Sellar, MSN, CDCES Diabetes Coordinator Inpatient Diabetes Program [phone] (team pager from 8a-5p)

## 2022-11-13 NOTE — Assessment & Plan Note (Addendum)
Hx of prediabetes, last A1c 5.5 in 2022. Most recent A1c 10.6. Yesterday afternoon BS 375, K 5.9, AG 16, given 9 SAI units and placed on 50 cc/hr 1/2 NS, K improved 3.2, repleted 10 mEq IV x4,  BS improved 282>245>226>205>228> 232 given 3units SAI. Only eating 20-25% of meals, will start PO meds to help control glucose.  - FMTS, attending Dr. Linwood Dibbles  - Progressive, Vital signs per floor - GOC w/ sister yesterday, continue w/ insulin and eliquis, will have palliative discussion outpatient - Discontinued 1/2 NS today  - BG checks q4h - K 3.1, repleted and Na 146  - Start Metformin 500 mg daily and Jardiance 10 mg daily  - sSSI novolog

## 2022-11-13 NOTE — Progress Notes (Signed)
Daily Progress Note Intern Pager: [phone]  Patient name: Rick Schwartz Medical record number: [id number] Date of birth: October 09, 1940 Age: 82 y.o. Gender: male  Primary Care Provider: Vonna Drafts, MD Consultants: None  Code Status: DNR   Pt Overview and Major Events to Date:  8/25 Admitted and endotool d/c'ed  8/26 Started puree diet, sSSI,  8/27 Started metformin and jardiance  Assessment and Plan: Rick Schwartz is a 82 y.o. 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 Patient having some PO intake on puree diet, sugars better controlled in evening after 9u of SAI. Labs more reassuring. Discontinued IV fluids today, anticipate possible PM discharge tomorrow if PO intake adequate and tolerating DM meds.  Assessment & Plan Hyperosmolar hyperglycemic state (HHS) (HCC) (Resolved: 11/13/2022) Hx of prediabetes, last A1c 5.5 in 2022. Most recent A1c 10.6. Yesterday afternoon BS 375, K 5.9, AG 16, given 9 SAI units and placed on 50 cc/hr 1/2 NS, K improved 3.2, repleted 10 mEq IV x4,  BS improved 282>245>226>205>228> 232 given 3units SAI. Only eating 20-25% of meals, will start PO meds to help control glucose.  - FMTS, attending Dr. Linwood Dibbles  - Progressive, Vital signs per floor - GOC w/ sister yesterday, continue w/ insulin and eliquis, will have palliative discussion outpatient - Discontinued 1/2 NS today  - BG checks q4h - K 3.1, repleted and Na 146  - Start Metformin 500 mg daily and Jardiance 10 mg daily  - sSSI novolog  Protein-calorie malnutrition (HCC) Patient cachetic on exam.  - SLP following, appreciate recs - Patient continues to have limited PO intake, per sister patient mantains pureed diet at home - Encourage PO intake  - PT/OT no need for services at discharge  - Fall precautions - Delirium precautions     FEN/GI: Dys 1 diet PPx: Eliquis  Dispo:Home today or tomorrow. Barriers include PO intake and  observing new med regimen   Subjective:  Patient well this morning. No complaints. Eating some, no nausea or emesis. Aware of diabetes. Would like to go home today if possible.   Objective: Temp:  [98.3 F (36.8 C)-98.7 F (37.1 C)] 98.3 F (36.8 C) (08/27 0350) Pulse Rate:  [70-93] 93 (08/27 0350) Resp:  [16-23] 21 (08/27 0350) BP: (103-131)/(62-88) 119/66 (08/27 0350) SpO2:  [92 %-100 %] 100 % (08/27 0350) Weight:  [125 lb (56.7 kg)] 125 lb (56.7 kg) (08/27 0402) Physical Exam:  General: cachetic appearing, wheelchair bound at baseline, responds to voice and touch, s/p right AKA.  Cardiovascular: irregular rhythm on exam, no murmurs appreciated  Respiratory: NWOB, 3L [place] in place Gastrointestinal: bowel sounds present, soft, non tender, no hepatosplenomegaly  MSK: poor muscle tone with diffuse atrophy, right hand w/ contracture  Neuro: Mentation improving this, nonverbal at baseline, but able to answer most questions   Laboratory: Most recent CBC Lab Results  Component Value Date   WBC 15.0 (H) 11/12/2022   HGB 14.6 11/12/2022   HCT 46.6 11/12/2022   MCV 90.0 11/12/2022   PLT 207 11/12/2022   Most recent BMP    Latest Ref Rng & Units 11/12/2022    5:34 PM  BMP  Glucose 70 - 99 mg/dL 147   BUN 8 - 23 mg/dL 20   Creatinine 8.29 - 1.24 mg/dL 5.62   Sodium 130 - 865 mmol/L 145   Potassium 3.5 - 5.1 mmol/L 3.2   Chloride 98 - 111  mmol/L 109   CO2 22 - 32 mmol/L 27   Calcium 8.9 - 10.3 mg/dL 8.4     Other pertinent labs   282 >245>226>205>228 > 232  Imaging/Diagnostic Tests: None  Peterson Ao, MD 11/13/2022, 7:17 AM  PGY-1, [facility] Family Medicine FPTS Intern pager: [phone], text pages welcome Secure chat group [facility] Teaching Service

## 2022-11-13 NOTE — Assessment & Plan Note (Addendum)
Patient cachetic on exam.  - SLP following, appreciate recs - Patient continues to have limited PO intake, per sister patient mantains pureed diet at home - Encourage PO intake  - PT/OT no need for services at discharge  - Fall precautions - Delirium precautions

## 2022-11-13 NOTE — TOC Initial Note (Signed)
Transition of Care [facility]) - Initial/Assessment Note    Patient Details  Name: Rick Schwartz MRN: [id number] Date of Birth: [birth year]  Transition of Care [facility]) CM/SW Contact:    Leone Haven, RN Phone Number: 11/13/2022, 10:28 AM  Clinical Narrative:                 From home with his sister [name], Legal Guardian, and her spouse, she takes care of patient 24/7. He  has PCP and insurance on file, [name] states has no HH services in place at this time , she is his caregiver, he has a hospital bed and a wheelchair.  States she will transport him home at [employer] and family is support system, states gets medications from [facility] on [address]  [name] states she and her husband will transport him by car at [employer].    Expected Discharge Plan: Home/Self Care Barriers to Discharge: Continued Medical Work up   Patient Goals and CMS Choice Patient states their goals for this hospitalization and ongoing recovery are:: return home   Choice offered to / list presented to : NA      Expected Discharge Plan and Services In-house Referral: NA Discharge Planning Services: CM Consult Post Acute Care Choice: NA Living arrangements for the past 2 months: Single Family Home                 DME Arranged: N/A DME Agency: NA       HH Arranged: NA          Prior Living Arrangements/Services Living arrangements for the past 2 months: Single Family Home Lives with:: Siblings Patient language and need for interpreter reviewed:: Yes Do you feel safe going back to the place where you live?: Yes      Need for Family Participation in Patient Care: Yes (Comment) Care giver support system in place?: Yes (comment) Current home services: DME (hospital bed, w/chair) Criminal Activity/Legal Involvement Pertinent to Current Situation/Hospitalization: No - Comment as needed  Activities of Daily Living      Permission Sought/Granted                  Emotional Assessment          Alcohol / Substance Use: Not Applicable Psych Involvement: No (comment)  Admission diagnosis:  Hyperosmolar hyperglycemic state (HHS) (HCC) [E11.00] Patient Active Problem List   Diagnosis Date Noted   Elevated troponin 11/11/2022   Foul smelling urine 11/11/2022   AKI (acute kidney injury) (HCC) 06/13/2022   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   Goals of care, counseling/discussion 01/27/2021   Spastic quadriplegic cerebral palsy (HCC)    Chronic HFrEF (heart failure with reduced ejection fraction) (HCC)    Dementia without behavioral disturbance (HCC)    Pressure injury of skin 09/22/2020   Right pulmonary embolus (HCC) 09/21/2020   Inanition (HCC)    Foot ulcer with necrosis of muscle, right (HCC) 07/18/2020   Protein-calorie malnutrition (HCC)    Gangrene of right foot (HCC) 07/17/2020   Focal motor deficit 07/07/2020   Physical deconditioning 05/06/2020   Prediabetes 07/20/2016   HALLUX RIGIDUS, ACQUIRED 04/11/2010   PROSTATE CANCER 09/18/2007   GLUCOSE INTOLERANCE 05/16/2006   HYPERCHOLESTEROLEMIA 05/16/2006   Infantile cerebral palsy (HCC) 05/16/2006   HYPERTENSION, BENIGN SYSTEMIC 05/16/2006   BPH 05/16/2006   PCP:  Vonna Drafts, MD Pharmacy:   [facility] [phone] [place] Otto, [place] - [address] AT [address] [place] [address] Phone: [phone] Fax: [phone]  [name] [address] [place] [address] Phone: [phone] Fax: [phone]     Social Determinants of Health (SDOH) Social History: SDOH Screenings   Food Insecurity: No Food Insecurity (09/24/2022)  Housing: Low Risk  (09/24/2022)  Transportation Needs: No Transportation Needs (09/24/2022)  Utilities: Not At Risk (09/24/2022)  Alcohol Screen: Low Risk  (09/24/2022)  Depression (PHQ2-9): Low Risk  (09/24/2022)  Financial Resource Strain: Low Risk  (09/24/2022)  Physical Activity: Inactive (09/24/2022)  Social Connections: Socially Isolated  (09/24/2022)  Stress: No Stress Concern Present (09/24/2022)  Tobacco Use: Medium Risk (11/10/2022)   SDOH Interventions:     Readmission Risk Interventions     No data to display

## 2022-11-14 ENCOUNTER — Inpatient Hospital Stay (HOSPITAL_COMMUNITY): Payer: 59

## 2022-11-14 DIAGNOSIS — E1100 Type 2 diabetes mellitus with hyperosmolarity without nonketotic hyperglycemic-hyperosmolar coma (NKHHC): Secondary | ICD-10-CM | POA: Diagnosis not present

## 2022-11-14 LAB — BASIC METABOLIC PANEL
Anion gap: 10 (ref 5–15)
Anion gap: 12 (ref 5–15)
BUN: 16 mg/dL (ref 8–23)
BUN: 18 mg/dL (ref 8–23)
CO2: 26 mmol/L (ref 22–32)
CO2: 21 mmol/L — ABNORMAL LOW (ref 22–32)
Calcium: 9.4 mg/dL (ref 8.9–10.3)
Calcium: 9.1 mg/dL (ref 8.9–10.3)
Chloride: 113 mmol/L — ABNORMAL HIGH (ref 98–111)
Chloride: 116 mmol/L — ABNORMAL HIGH (ref 98–111)
Creatinine, Ser: 1.23 mg/dL (ref 0.61–1.24)
Creatinine, Ser: 1.27 mg/dL — ABNORMAL HIGH (ref 0.61–1.24)
GFR, Estimated: 59 mL/min — ABNORMAL LOW (ref >60)
GFR, Estimated: 57 mL/min — ABNORMAL LOW (ref >60)
Glucose, Bld: 235 mg/dL — ABNORMAL HIGH (ref 70–99)
Glucose, Bld: 253 mg/dL — ABNORMAL HIGH (ref 70–99)
Potassium: 2.7 mmol/L — CL (ref 3.5–5.1)
Potassium: 3.8 mmol/L (ref 3.5–5.1)
Sodium: 149 mmol/L — ABNORMAL HIGH (ref 135–145)
Sodium: 149 mmol/L — ABNORMAL HIGH (ref 135–145)

## 2022-11-14 LAB — GLUCOSE, CAPILLARY
Glucose-Capillary: 194 mg/dL — ABNORMAL HIGH (ref 70–99)
Glucose-Capillary: 238 mg/dL — ABNORMAL HIGH (ref 70–99)
Glucose-Capillary: 334 mg/dL — ABNORMAL HIGH (ref 70–99)
Glucose-Capillary: 326 mg/dL — ABNORMAL HIGH (ref 70–99)

## 2022-11-14 LAB — MAGNESIUM: Magnesium: 2.3 mg/dL (ref 1.7–2.4)

## 2022-11-14 MED ORDER — EMPAGLIFLOZIN 25 MG PO TABS
25.0000 mg | ORAL_TABLET | Freq: Every day | ORAL | Status: DC
  Administered 2022-11-15 – 2022-11-17 (×3): 25 mg via ORAL
  Filled 2022-11-14 (×3): qty 1

## 2022-11-14 MED ORDER — POTASSIUM CHLORIDE 20 MEQ PO PACK
40.0000 meq | PACK | Freq: Once | ORAL | Status: AC
  Administered 2022-11-14: 40 meq via ORAL
  Filled 2022-11-14: qty 2

## 2022-11-14 MED ORDER — POTASSIUM CHLORIDE 10 MEQ/100ML IV SOLN
10.0000 meq | INTRAVENOUS | Status: AC
  Administered 2022-11-14 (×4): 10 meq via INTRAVENOUS
  Filled 2022-11-14 (×4): qty 100

## 2022-11-14 MED ORDER — POTASSIUM CHLORIDE 20 MEQ PO PACK
40.0000 meq | PACK | ORAL | Status: DC
Start: 2022-11-14 — End: 2022-11-14

## 2022-11-14 NOTE — Plan of Care (Signed)
?  Problem: Clinical Measurements: ?Goal: Will remain free from infection ?Outcome: Progressing ?  ?Problem: Clinical Measurements: ?Goal: Diagnostic test results will improve ?Outcome: Progressing ?  ?

## 2022-11-14 NOTE — Assessment & Plan Note (Addendum)
Patient cachetic on exam.  - SLP following, appreciate recs - Modified barium swallow, today  - Patient eating 25% of PO intake, per sister patient mantains pureed diet at home - Encourage PO intake  - PT/OT no need for services at discharge  - Fall precautions - Delirium precautions

## 2022-11-14 NOTE — Progress Notes (Signed)
Speech Language Pathology Treatment: Dysphagia  Patient Details Name: Rick Schwartz MRN: [id number] DOB: [birth year] Today's Date: 11/14/2022 Time: 8469-6295 SLP Time Calculation (min) (ACUTE ONLY): 41 min  Assessment / Plan / Recommendation Clinical Impression  pt seen to assess po tolerance and determine indication for instrumental swallow evaluation. Pt today alert and pleasant. RN reports pt with increased coughingtoday with po intake as well as congestion. He did not follow directions to cough per SLP cue. SLP observed pt consuming with thin via tsp, nectar via cup and attempted via straw. Pt continues with oral discoordination, lingual thrusting and anterior labial spillage on right consistent with his CP. No overt indication of aspiration with po trials however RN endorses pt is continuing to cough. SLP called pt's sister, [name], who reports pt had been doing well at home on pureed *food she mashed at home* prior to admission but recently was coughing and expectorating while eating /drinking. She states pt was informed about dysphagia and a "flap" not working well during prior hospital visit for pna in 2023. SLP recommends pt participate in MBS at this time, given concern for aspiration, prior pna, the fact that he has not had prior MBS completed and has CP. Attempts made to schedule with xray and other SLP staff, at this time, xray reports they will try to do MBS at this time at [facility]. Thank you to all who have helped with this pt. Pt's sister, pt, RN and teaching service informed and agreeable to plan.   HPI HPI: Rick Schwartz is a 82 y.o. male presenting with AMS x4 days. CXR 8/24: "1. Small right pleural effusion, similar to prior exam.  2. No acute findings."  Head CT 8/25 with no acute findings.  Pt consumes puree diet at baseline. PMHx significant for cerebral palsy, Hx of prostate cancer, PAD, irregular heart rhythm, Hx of PE 2022 on Eliquis, HFrEF, dementia, HLD, HTN.      SLP  Plan  MBS      Recommendations for follow up therapy are one component of a multi-disciplinary discharge planning process, led by the attending physician.  Recommendations may be updated based on patient status, additional functional criteria and insurance authorization.    Recommendations  Diet recommendations: Dysphagia 1 (puree);Thin liquid;Nectar-thick liquid Liquids provided via: Cup Medication Administration: Crushed with puree Supervision: Full supervision/cueing for compensatory strategies;Trained caregiver to feed patient Compensations: Minimize environmental distractions;Slow rate;Small sips/bites;Follow solids with liquid Postural Changes and/or Swallow Maneuvers: Seated upright 90 degrees;Upright 30-60 min after meal                  Oral care BID   Frequent or constant Supervision/Assistance Dysphagia, unspecified (R13.10)     MBS    Rolena Infante, MS [facility] SLP Acute Rehab Services Office [phone]  Chales Abrahams  11/14/2022, 10:41 AM

## 2022-11-14 NOTE — Progress Notes (Signed)
Daily Progress Note Intern Pager: [phone]  Patient name: Rick Schwartz Medical record number: [id number] Date of birth: [birth year] Age: 82 y.o. Gender: male  Primary Care Provider: Vonna Drafts, MD Consultants: None Code Status: DNR   Pt Overview and Major Events to Date:  8/25 Admitted and endotool d/c'ed  8/26 Started puree diet, sSSI,  8/27 Started Jardiance 8/28 Metformin  Assessment and Plan: Rick Schwartz is a 82 y.o. 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 Patient having some PO intake on puree diet.Blood sugar range 390s-190s past 24 hours. Now on metformin and jardiance to optimize glucose control. Replete K and check BMP 12PM. SLP following and recs barium swallow study today. Possible discharge tomorrow pending results and BS control.   Assessment & Plan Hyperosmolar hyperglycemic state (HHS) (HCC) (Resolved: 11/13/2022) Hx of prediabetes, last A1c 5.5 in 2022. Most recent A1c 10.6. Eating 25% of meals, currently on PO DM meds, 17 units of SAI yesterday, BS 232> 3u SAI > 270> 290> 5u SAI > 391> 9u SAI > 322 > 214 > FSBS 194 > 2u SAI  - FMTS, attending Dr. Linwood Dibbles  - Progressive, Vital signs per floor - BG checks q4h - K 2.7, repleted w/ IV and PO K, Na 149 - PM BMP  - AM BMP  - Metformin 500 mg daily and Jardiance 10 mg daily  - sSSI novolog  Protein-calorie malnutrition (HCC) Patient cachetic on exam.  - SLP following, appreciate recs - Modified barium swallow, today  - Patient eating 25% of PO intake, per sister patient mantains pureed diet at home - Encourage PO intake  - PT/OT no need for services at discharge  - Fall precautions - Delirium precautions    FEN/GI: Dys 1 (puree); Nectar-thick liquid JYN:[id number] Dispo:Home tomorrow Barriers include stable electrolytes, dysphagia and diabetes regimen .   Subjective:  Patient well this morning. No complaints. Continues to tolerate food w/o  emesis or nausea. BM x1. Desires home discharge.   Objective: Temp:  [97.4 F (36.3 C)-98.4 F (36.9 C)] 97.6 F (36.4 C) (08/28 0417) Pulse Rate:  [77-108] 99 (08/28 0417) Resp:  [10-32] 19 (08/28 0417) BP: (114-143)/(62-89) 127/65 (08/28 0417) SpO2:  [95 %-100 %] 100 % (08/28 0417) Weight:  [125 lb 3.5 oz (56.8 kg)] 125 lb 3.5 oz (56.8 kg) (08/28 0553)  Physical Exam: General: cachetic appearing, wheelchair bound at baseline, responds to voice and touch, s/p right AKA.  Cardiovascular: irregular rhythm on exam, no murmurs appreciated  Respiratory: NWOB, satting well on RA  Gastrointestinal: bowel sounds present, soft, non tender, no hepatosplenomegaly  MSK: poor muscle tone with diffuse atrophy, right hand w/ contracture  Neuro: Mentation improving this, nonverbal at baseline, but able to answer most questions  Laboratory: Most recent CBC Lab Results  Component Value Date   WBC 9.0 11/13/2022   HGB 13.4 11/13/2022   HCT 42.4 11/13/2022   MCV 91.2 11/13/2022   PLT 163 11/13/2022      Latest Ref Rng & Units 11/14/2022    3:54 AM 11/13/2022    6:56 AM 11/12/2022    5:34 PM  BMP  Glucose 70 - 99 mg/dL 308  657  846   BUN 8 - 23 mg/dL 16  16  20    Creatinine 0.61 - 1.24 mg/dL 9.62  9.52  8.41   Sodium 135 - 145 mmol/L 149  146  145  Potassium 3.5 - 5.1 mmol/L 2.7  3.1  3.2   Chloride 98 - 111 mmol/L 113  111  109   CO2 22 - 32 mmol/L 26  28  27    Calcium 8.9 - 10.3 mg/dL 9.4  8.4  8.4     Other pertinent labs    Mag 2.3  BS 232> 3u SAI > 270> 290> 5u SAI > 391> 9u SAI > 322 > 214 > FSBS 194 > 2u SAI  Imaging/Diagnostic Tests: None  Peterson Ao, MD 11/14/2022, 7:20 AM  PGY-1, [facility] Family Medicine FPTS Intern pager: [phone], text pages welcome Secure chat group [facility] Teaching Service

## 2022-11-14 NOTE — Plan of Care (Signed)
  Problem: Health Behavior/Discharge Planning: Goal: Ability to manage health-related needs will improve Outcome: Progressing   

## 2022-11-14 NOTE — Assessment & Plan Note (Addendum)
Hx of prediabetes, last A1c 5.5 in 2022. Most recent A1c 10.6. Eating 25% of meals, currently on PO DM meds, 17 units of SAI yesterday, BS 232> 3u SAI > 270> 290> 5u SAI > 391> 9u SAI > 322 > 214 > FSBS 194 > 2u SAI  - FMTS, attending Dr. Linwood Dibbles  - Progressive, Vital signs per floor - BG checks q4h - K 2.7, repleted w/ IV and PO K, Na 149 - PM BMP  - AM BMP  - Metformin 500 mg daily and Jardiance 10 mg daily  - sSSI novolog

## 2022-11-14 NOTE — Procedures (Signed)
Modified Barium Swallow Study  Patient Details  Name: Rick Schwartz MRN: [id number] Date of Birth: 1940-12-27  Today's Date: 11/14/2022  Modified Barium Swallow completed.  Full report located under Chart Review in the Imaging Section.  History of Present Illness Rick Schwartz is a 82 y.o. male presenting with AMS x4 days. CXR 8/24: "1. Small right pleural effusion, similar to prior exam.  2. No acute findings."  Head CT 8/25 with no acute findings.  Pt consumes puree diet at baseline. PMHx significant for cerebral palsy, Hx of prostate cancer, PAD, irregular heart rhythm, Hx of PE 2022 on Eliquis, HFrEF, dementia, HLD, HTN.   Clinical Impression Patient presents with a severe oral phase dysphagia and a mild-moderate pharyngeal phase dysphagia as per this modified barium swallow study. Patient was awake and alert and participated but positioning adequately in chair in radiology suite was difficulty, resulting in limited view of trachea and pyriform sinuses due to obstruction from shoulder. During oral phase, patient with very limited lingual movement, leading to majority of boluses administered (thin liquid, nectar thick liquid, puree) spilling out of mouth anteriorily or remaining in oral cavity on lingual surface, lateral sulci and base of tongue. Transit of all tested boluses was significantly delayed, with no difference observed when thin liquids given via cup, spoon or siphoned from straw. During pharyngeal phase, swallow was initiated at vallecular sinus for nectar thick and puree solids and at pyriform sinus for thin liquids. Penetration that did not clear observed with thin liquids (PAS 3) but no aspiration observed. Mild-moderate clearing to mild amount of pharyngeal residuals observed with all consistencies in vallecular sinus, pyriform sinus and posterior pharyngeal wall. SLP recommending to continue with current PO diet of Dys 1 (puree), nectar thick liquids however based on this MBS,  patient appears at significant risk of dehydration and malnutrition and to a lesser extent, aspiration. Patient will benefit from continued intervention during his acute care stay and at next venue of care with focus on patient and family education regarding swallow safety. Factors that may increase risk of adverse event in presence of aspiration [employer] 2021): Poor general health and/or compromised immunity;Reduced cognitive function;Limited mobility;Weak cough;Dependence for feeding and/or oral hygiene;Frail or deconditioned  Swallow Evaluation Recommendations Recommendations: PO diet PO Diet Recommendation: Dysphagia 1 (Pureed);Mildly thick liquids (Level 2, nectar thick) Liquid Administration via: Spoon;Cup Medication Administration: Crushed with puree Supervision: Full supervision/cueing for swallowing strategies;Full assist for feeding Swallowing strategies  : Slow rate Postural changes: Position pt fully upright for meals Oral care recommendations: Oral care BID (2x/day);Oral care before PO;Use suctioning for oral care;Staff/trained caregiver to provide oral care Recommended consults: Consider Palliative care    Angela Nevin, MA, CCC-SLP Speech Therapy

## 2022-11-14 NOTE — Care Management Important Message (Signed)
Important Message  Patient Details  Name: Rick Schwartz MRN: [id number] Date of Birth: 06-23-1940   Medicare Important Message Given:  Yes     Renie Ora 11/14/2022, 11:08 AM

## 2022-11-15 ENCOUNTER — Encounter (HOSPITAL_COMMUNITY): Payer: Self-pay | Admitting: Student

## 2022-11-15 DIAGNOSIS — E870 Hyperosmolality and hypernatremia: Secondary | ICD-10-CM

## 2022-11-15 DIAGNOSIS — E871 Hypo-osmolality and hyponatremia: Secondary | ICD-10-CM

## 2022-11-15 DIAGNOSIS — Z7189 Other specified counseling: Secondary | ICD-10-CM | POA: Diagnosis not present

## 2022-11-15 DIAGNOSIS — Z515 Encounter for palliative care: Secondary | ICD-10-CM | POA: Diagnosis not present

## 2022-11-15 DIAGNOSIS — E1165 Type 2 diabetes mellitus with hyperglycemia: Secondary | ICD-10-CM | POA: Diagnosis not present

## 2022-11-15 DIAGNOSIS — E119 Type 2 diabetes mellitus without complications: Secondary | ICD-10-CM | POA: Diagnosis not present

## 2022-11-15 DIAGNOSIS — E1100 Type 2 diabetes mellitus with hyperosmolarity without nonketotic hyperglycemic-hyperosmolar coma (NKHHC): Secondary | ICD-10-CM | POA: Diagnosis not present

## 2022-11-15 HISTORY — DX: Type 2 diabetes mellitus without complications: E11.9

## 2022-11-15 HISTORY — DX: Type 2 diabetes mellitus with hyperglycemia: E11.65

## 2022-11-15 LAB — BASIC METABOLIC PANEL
Anion gap: 11 (ref 5–15)
Anion gap: 9 (ref 5–15)
BUN: 21 mg/dL (ref 8–23)
BUN: 27 mg/dL — ABNORMAL HIGH (ref 8–23)
CO2: 22 mmol/L (ref 22–32)
CO2: 23 mmol/L (ref 22–32)
Calcium: 9.6 mg/dL (ref 8.9–10.3)
Calcium: 9.8 mg/dL (ref 8.9–10.3)
Chloride: 118 mmol/L — ABNORMAL HIGH (ref 98–111)
Chloride: 124 mmol/L — ABNORMAL HIGH (ref 98–111)
Creatinine, Ser: 1.33 mg/dL — ABNORMAL HIGH (ref 0.61–1.24)
Creatinine, Ser: 1.38 mg/dL — ABNORMAL HIGH (ref 0.61–1.24)
GFR, Estimated: 54 mL/min — ABNORMAL LOW (ref >60)
GFR, Estimated: 51 mL/min — ABNORMAL LOW (ref >60)
Glucose, Bld: 321 mg/dL — ABNORMAL HIGH (ref 70–99)
Glucose, Bld: 331 mg/dL — ABNORMAL HIGH (ref 70–99)
Potassium: 2.9 mmol/L — ABNORMAL LOW (ref 3.5–5.1)
Potassium: 3.5 mmol/L (ref 3.5–5.1)
Sodium: 151 mmol/L — ABNORMAL HIGH (ref 135–145)
Sodium: 156 mmol/L — ABNORMAL HIGH (ref 135–145)

## 2022-11-15 LAB — GLUCOSE, CAPILLARY
Glucose-Capillary: 361 mg/dL — ABNORMAL HIGH (ref 70–99)
Glucose-Capillary: 257 mg/dL — ABNORMAL HIGH (ref 70–99)
Glucose-Capillary: 246 mg/dL — ABNORMAL HIGH (ref 70–99)

## 2022-11-15 MED ORDER — INSULIN ASPART 100 UNIT/ML IJ SOLN: 0.0000 [IU] | Freq: Three times a day (TID) | INTRAMUSCULAR | Status: DC

## 2022-11-15 MED ORDER — ORAL CARE MOUTH RINSE: 15.0000 mL | OROMUCOSAL | Status: DC | PRN

## 2022-11-15 MED ORDER — SODIUM CHLORIDE 0.45 % IV SOLN: INTRAVENOUS | Status: DC

## 2022-11-15 MED ORDER — INSULIN ASPART 100 UNIT/ML IJ SOLN
0.0000 [IU] | Freq: Three times a day (TID) | INTRAMUSCULAR | Status: DC
  Administered 2022-11-15: 8 [IU] via SUBCUTANEOUS

## 2022-11-15 MED ORDER — ORAL CARE MOUTH RINSE
15.0000 mL | OROMUCOSAL | Status: DC
  Administered 2022-11-15 – 2022-11-17 (×8): 15 mL via OROMUCOSAL

## 2022-11-15 MED ORDER — INSULIN GLARGINE-YFGN 100 UNIT/ML ~~LOC~~ SOLN
5.0000 [IU] | Freq: Every day | SUBCUTANEOUS | Status: DC
  Administered 2022-11-15: 5 [IU] via SUBCUTANEOUS
  Filled 2022-11-15 (×2): qty 0.05

## 2022-11-15 MED ORDER — SODIUM CHLORIDE 0.45 % IV SOLN
INTRAVENOUS | Status: DC
  Administered 2022-11-15: 75 mL/h via INTRAVENOUS

## 2022-11-15 MED ORDER — INSULIN ASPART 100 UNIT/ML IJ SOLN
0.0000 [IU] | Freq: Three times a day (TID) | INTRAMUSCULAR | Status: DC
  Administered 2022-11-15: 3 [IU] via SUBCUTANEOUS
  Administered 2022-11-16: 2 [IU] via SUBCUTANEOUS
  Administered 2022-11-16: 7 [IU] via SUBCUTANEOUS
  Administered 2022-11-16: 3 [IU] via SUBCUTANEOUS
  Administered 2022-11-17: 2 [IU] via SUBCUTANEOUS

## 2022-11-15 MED ORDER — POTASSIUM CHLORIDE 20 MEQ PO PACK
40.0000 meq | PACK | ORAL | Status: AC
  Administered 2022-11-15 (×2): 40 meq via ORAL
  Filled 2022-11-15 (×2): qty 2

## 2022-11-15 NOTE — Progress Notes (Addendum)
Daily Progress Note Intern Pager: [phone]  Patient name: Rick Schwartz Medical record number: [id number] Date of birth: 09-13-1940 Age: 82 y.o. Gender: male  Primary Care Provider: Vonna Drafts, MD Consultants: SLP and Palliative Care  Code Status: DNR   Pt Overview and Major Events to Date:  8/25 Admitted and endotool d/c'ed  8/26 Started puree diet, sSSI,  8/27 Started Jardiance 8/28 Metformin 8/29 Increased Jardiance   Assessment and Plan: Rick Schwartz is a 82 y.o. 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 SLP following and recommended [facility] w/ SLP services for discharge. Blood sugar range 360s-190s past 24 hours. Now on metformin, increased jardiance, and added semglee for basal coverage and optimization. Palliative consult placed to discuss goals of care discussion, guardian would like to pursue palliative outpatient.  Assessment & Plan Diabetes mellitus without complication (HCC) Hx of prediabetes, last A1c 5.5 in 2022. Most recent A1c 10.6. Limited PO intake, currently on PO DM meds, 12 units of SAI yesterday, BGS 238 > 334>326>361(FSBS). Increased Jardiance SSI. Will add basal semglee tonight , if tolerating PO and BS still uncontrolled.  - BG checks q4h - K 2.9, repleted 40 mEq x2, Na 151 - Metformin 500 mg daily and Jardiance 25 mg daily  - mSSI novolog Protein-calorie malnutrition (HCC) Patient cachetic on exam.  - SLP following, dys 1 fluid diet  w/ nectar thick fluids  - Modified barium swallow, concern for severe dysphagia and inability to maintain proper PO intake, recommended palliative - Palliative consult placed, to discuss goals of care vs end of life care, appreciate recs - Fall precautions - Delirium precautions    FEN/GI: Dysphagia diet 1, thick nectar liquids PPx: Eliquis  Dispo:Home  pending conversation with Palliative  .   Subjective:  No pain this morning. Wants to drink water. BM x1.    Objective: Temp:  [97.6 F (36.4 C)-99.1 F (37.3 C)] 99.1 F (37.3 C) (08/29 0441) Pulse Rate:  [92-103] 99 (08/29 0441) Resp:  [16-21] 16 (08/29 0441) BP: (103-140)/(67-80) 128/67 (08/29 0441) SpO2:  [98 %-100 %] 99 % (08/29 0441) Weight:  [121 lb 4.1 oz (55 kg)] 121 lb 4.1 oz (55 kg) (08/29 0521)  Physical Exam: General: cachetic appearing, propped up in bed, responds to voice and touch, s/p right AKA.  Cardiovascular: irregular rhythm on exam, no murmurs appreciated  Respiratory: NWOB, satting well on RA  Gastrointestinal: bowel sounds present, soft, non tender, no hepatosplenomegaly  MSK: poor muscle tone with diffuse atrophy, right hand w/ contracture  Neuro: Mentation back to baseline, nonverbal at baseline,answer 1-3 words   Laboratory: Most recent CBC Lab Results  Component Value Date   WBC 9.0 11/13/2022   HGB 13.4 11/13/2022   HCT 42.4 11/13/2022   MCV 91.2 11/13/2022   PLT 163 11/13/2022      Latest Ref Rng & Units 11/15/2022    3:08 AM 11/14/2022   11:32 AM 11/14/2022    3:54 AM  BMP  Glucose 70 - 99 mg/dL 875  643  329   BUN 8 - 23 mg/dL 21  18  16    Creatinine 0.61 - 1.24 mg/dL 5.18  8.41  6.60   Sodium 135 - 145 mmol/L 151  149  149   Potassium 3.5 - 5.1 mmol/L 2.9  3.8  2.7   Chloride 98 - 111 mmol/L 118  116  113   CO2 22 -  32 mmol/L 22  21  26    Calcium 8.9 - 10.3 mg/dL 9.6  9.1  9.4      Other pertinent labs   BGS 238 > 334>326>361(FSBS)   Imaging/Diagnostic Tests:  8/28 Swallow study:  Clinical Impression: Patient presents with a severe oral phase dysphagia and a mild-moderate pharyngeal phase dysphagia as per this modified barium swallow study. Patient was awake and alert and participated but positioning adequately in chair in radiology suite was difficulty, resulting in limited view of trachea and pyriform sinuses due to obstruction from shoulder. During oral phase, patient with very limited lingual movement, leading to majority of boluses  administered (thin liquid, nectar thick liquid, puree) spilling out of mouth anteriorily or remaining in oral cavity on lingual surface, lateral sulci and base of tongue. Transit of all tested boluses was significantly delayed, with no difference observed when thin liquids given via cup, spoon or siphoned from straw. During pharyngeal phase, swallow was initiated at vallecular sinus for nectar thick and puree solids and at pyriform sinus for thin liquids. Penetration that did not clear observed with thin liquids (PAS 3) but no aspiration observed. Mild-moderate clearing to mild amount of pharyngeal residuals observed with all consistencies in vallecular sinus, pyriform sinus and posterior pharyngeal wall. SLP recommending to continue with current PO diet of Dys 1 (puree), nectar thick liquids however based on this MBS, patient appears at significant risk of dehydration and malnutrition and to a lesser extent, aspiration. Patient will benefit from continued intervention during his acute care stay and at next venue of care with focus on patient and family education regarding swallow safety.   Rick Ao, MD 11/15/2022, 7:13 AM  PGY-1, [facility] Health Family Medicine FPTS Intern pager: [phone], text pages welcome Secure chat group [facility] Teaching Service

## 2022-11-15 NOTE — TOC Progression Note (Signed)
Transition of Care [facility]) - Progression Note    Patient Details  Name: Rick Schwartz MRN: [id number] Date of Birth: 1940-05-14  [facility]) CM/SW Contact  Leone Haven, RN Phone Number: 11/15/2022, 11:24 AM  Clinical Narrative:    Per MD wants patient to have HHST at home.  NCM offered choice to sister, [name], she states she does not have a preference of the agency.  NCM made referral to [facility] with [facility].  She is able to take HHST . Soc will begin 24 to 48 hrs post dc.     Expected Discharge Plan: Home w Home Health Services Barriers to Discharge: Continued Medical Work up  Expected Discharge Plan and Services In-house Referral: NA Discharge Planning Services: CM Consult Post Acute Care Choice: Home Health Living arrangements for the past 2 months: Single Family Home                 DME Arranged: N/A DME Agency: NA       HH Arranged: Speech Therapy HH Agency: [facility] Date HH Agency Contacted: 11/15/22 Time HH Agency Contacted: 1123 Representative spoke with at [facility] Agency: Tresa Endo   Social Determinants of Health (SDOH) Interventions SDOH Screenings   Food Insecurity: No Food Insecurity (09/24/2022)  Housing: Low Risk  (09/24/2022)  Transportation Needs: No Transportation Needs (09/24/2022)  Utilities: Not At Risk (09/24/2022)  Alcohol Screen: Low Risk  (09/24/2022)  Depression (PHQ2-9): Low Risk  (09/24/2022)  Financial Resource Strain: Low Risk  (09/24/2022)  Physical Activity: Inactive (09/24/2022)  Social Connections: Socially Isolated (09/24/2022)  Stress: No Stress Concern Present (09/24/2022)  Tobacco Use: Medium Risk (11/10/2022)    Readmission Risk Interventions     No data to display

## 2022-11-15 NOTE — Assessment & Plan Note (Deleted)
Hx of prediabetes, last A1c 5.5 in 2022. Most recent A1c 10.6. Limited PO intake, currently on PO DM meds, 12 units of SAI yesterday, BGS 238 > 334>326>361(FSBS). Increased Jardiance and change moderate SSI. Will consider 5u of semglee, if tolerating PO and BS still uncontrolled.  - BG checks q4h - K 2.9, repleted 40 mEq x2, Na 151 - Metformin 500 mg daily and Jardiance 25 mg daily  - mSSI novolog

## 2022-11-15 NOTE — Assessment & Plan Note (Signed)
Hx of prediabetes, last A1c 5.5 in 2022. Most recent A1c 10.6. Limited PO intake, currently on PO DM meds, 12 units of SAI yesterday, BGS 238 > 334>326>361(FSBS). Increased Jardiance SSI. Will add basal semglee tonight , if tolerating PO and BS still uncontrolled.  - BG checks q4h - K 2.9, repleted 40 mEq x2, Na 151 - Metformin 500 mg daily and Jardiance 25 mg daily  - mSSI novolog

## 2022-11-15 NOTE — Progress Notes (Signed)
Last BMP with Na 156 (160 corrected for hyperglycemia). This is chronic and worsening in the setting of his poor PO intake. Unfortunately despite getting 1/2 NS, his hypernatremia has worsened. His estimated free water deficit is 3.8L and he is not able to take free water by mouth in any meaningful quantities.  Goal rate of correction is 0.4-0.5 mmol/L/hr. With 1/2 NS this should require somewhere between 140-154mL/hr. However, need to take care with volume in this elderly gentleman with a history of CHF.  - Increasing rate to 121mL/hr - Repeat BMP in 6 hours - Need to closely monitor volume/respiratory status with this volume  - Should we not have success with the above or have issues with volume overload, we will need to transition to D5 W and deal with the glucose load through increased insulin dosing. Challenging case given that he came in with HHS.  - Do not drop sodium by >12 mEq/L in 24 hours  J Dorothyann Gibbs, MD

## 2022-11-15 NOTE — Progress Notes (Signed)
Speech Language Pathology Treatment: Dysphagia  Patient Details Name: KYALL WESBY MRN: [id number] DOB: 29-Aug-1940 Today's Date: 11/15/2022 Time: 6578-4696 SLP Time Calculation (min) (ACUTE ONLY): 10 min  Assessment / Plan / Recommendation Clinical Impression  Patient seen by SLP for skilled treatment focused on dysphagia goals. When SLP entered room, patient sitting up in bed with liquid PO's spilling from mouth onto hospital gown. SLP assisted patient with cleaning PO's from clothing and face as well as provided oral care via [place] suctioning. No significant amount of PO's observed in oral cavity. Patient requesting to "go up" and so SLP and student SLP repositioned patient in bed. He declined to have any PO's. After leaving room, SLP called patient's sister/primary caregiver on the phone to discuss his dysphagia. She reported that he was not having any difficulty with swallowing until current admission. She has been feeding him pureed solids and thin/regular liquids. She reported that he is in his recliner when he eats at home but also has a hospital bed. She cares for him but she works 8am-5pm and during that time, her husband cares for the patient. SLP discussed current PO recommendations for nectar thick liquids while he is here in the hospital but transition back to thin liquids when he is in his home environment. Initially, patient's sister declined home health SLP as she did not feel that it would change anything but SLP discussed benefits such as help in maximizing safest and best position for eating/drinking, maximizing PO intake, etc. Sister in agreement with this. SLP to s/o at this time.   SLP recommending HH SLP services upon discharge, continuation of puree and nectar thick liquids while in hospital but transition to puree, thin liquids upon discharge home.     HPI HPI: LOC WROBLESKI is a 82 y.o. male presenting with AMS x4 days. CXR 8/24: "1. Small right pleural effusion, similar  to prior exam.  2. No acute findings."  Head CT 8/25 with no acute findings.  Pt consumes puree diet at baseline. PMHx significant for cerebral palsy, Hx of prostate cancer, PAD, irregular heart rhythm, Hx of PE 2022 on Eliquis, HFrEF, dementia, HLD, HTN.      SLP Plan  Discharge SLP treatment due to (comment);All goals met      Recommendations for follow up therapy are one component of a multi-disciplinary discharge planning process, led by the attending physician.  Recommendations may be updated based on patient status, additional functional criteria and insurance authorization.    Recommendations  Diet recommendations: Dysphagia 1 (puree);Nectar-thick liquid Liquids provided via: Cup Medication Administration: Crushed with puree Supervision: Full supervision/cueing for compensatory strategies;Trained caregiver to feed patient Compensations: Minimize environmental distractions;Slow rate;Small sips/bites;Follow solids with liquid;Monitor for anterior loss Postural Changes and/or Swallow Maneuvers: Seated upright 90 degrees;Upright 30-60 min after meal                  Oral care BID;Oral care before and after PO   Frequent or constant Supervision/Assistance Dysphagia, unspecified (R13.10)     Discharge SLP treatment due to (comment);All goals met    Angela Nevin, MA, CCC-SLP Speech Therapy

## 2022-11-15 NOTE — Inpatient Diabetes Management (Signed)
Inpatient Diabetes Program Recommendations  AACE/ADA: New Consensus Statement on Inpatient Glycemic Control (2015)  Target Ranges:  Prepandial:   less than 140 mg/dL      Peak postprandial:   less than 180 mg/dL (1-2 hours)      Critically ill patients:  140 - 180 mg/dL   Lab Results  Component Value Date   GLUCAP 361 (H) 11/15/2022   HGBA1C 10.6 (H) 11/10/2022    Review of Glycemic Control  Diabetes history: DM2 Outpatient Diabetes medications: None Current orders for Inpatient glycemic control: Novolog 0-9 units TID, Jardiance 25 mg every day, Metformin 500 mg QD   Inpatient Diabetes Program Recommendations:     Please consider:   Semglee 10 units every day  Will continue to follow while inpatient.  Thank you, Dulce Sellar, MSN, CDCES Diabetes Coordinator Inpatient Diabetes Program [phone] (team pager from 8a-5p)

## 2022-11-15 NOTE — Assessment & Plan Note (Addendum)
Patient cachetic on exam.  - SLP following, dys 1 fluid diet  w/ nectar thick fluids  - Modified barium swallow, concern for severe dysphagia and inability to maintain proper PO intake, recommended palliative - Palliative consult placed, to discuss goals of care vs end of life care, appreciate recs - Fall precautions - Delirium precautions

## 2022-11-15 NOTE — Plan of Care (Signed)
?  Problem: Clinical Measurements: ?Goal: Will remain free from infection ?Outcome: Progressing ?  ?Problem: Clinical Measurements: ?Goal: Diagnostic test results will improve ?Outcome: Progressing ?  ?

## 2022-11-15 NOTE — Consult Note (Signed)
Consultation Note Date: 11/15/2022   Patient Name: Rick Schwartz  DOB: [birth year]  MRN: [id number]  Age / Sex: 82 y.o., male  PCP: Vonna Drafts, MD Referring Physician: Doreene Eland, MD  Reason for Consultation: Establishing goals of care  HPI/Patient Profile: 82 y.o. male  with past medical history of spastic quadriplegic cerebral palsy, HTN, BPH, protein calorie malnutrition, prostate cancer, prediabetes, HFrEF, PE 2022 on Eliquis, admitted on 11/10/2022 with altered mental status and increased urinary frequency.   Patient was admitted with HHS and during course of hospitalization was noted to have severe dysphagia with concern for ability to tolerate adequate p.o. intake. PMT has been consulted to assist with "end-of-life versus goals of care conversation."  Clinical Assessment and Goals of Care:  I have reviewed medical records including EPIC notes, labs and imaging, assessed the patient and then met at the bedside with patient's sister/legal guardian and Rick Schwartz to discuss diagnosis prognosis, GOC, EOL wishes, disposition and options.  I introduced Palliative Medicine as specialized medical care for people living with serious illness. It focuses on providing relief from the symptoms and stress of a serious illness. The goal is to improve quality of life for both the patient and the family.  We discussed a brief life review of the patient and then focused on their current illness.   I attempted to elicit values and goals of care important to the patient.    Medical History Review and Understanding:  We discussed patient's acute illness in the context of his chronic comorbidities with emphasis on his nutrition status and risk for recurrent episodes of dehydration, further malnutrition.  Social History: Patient lives with his sister and Rick Schwartz, who are his caregivers 24/7.  Functional  and Nutritional State: Patient is nonambulatory and mostly nonverbal at baseline.  He is able to state a few words.  Family reports he had a good appetite prior to his acute illness.  Albumin of 2.8 noted on 8/24.  Advance Directives: A detailed discussion regarding advanced directives was had.  No advance directives on file.  [name] Rick Schwartz is his legal guardian.  Discussion: [name] reports that patient has all his care needs met at home.  She is not very confident that speech therapy will make a difference and understands the nature of his dysphagia with poor prognosis for improvement.  The priority at this time is to make sure he can remain at home with loved ones and return there as soon as possible.  We discussed hospice philosophy given patient's high risk for multiple hospitalizations and a cycle of dehydration/worsening nutritional status. Rick Schwartz is initially very strongly against hospice and does not wish to even speak about it.  After discussing the difference between palliative care and hospice, she is agreeable to resuming palliative care visits with patient at home. It has been a couple years since the last note entered by [facility].   We discussed the resources that could be beneficial if hospice were to be considered in the future, such as assistance with caregivers a few times a week.  We discussed the caregiver burden they have been experiencing and how this could be a very impactful benefit.  She is open to additional support and did not realize this could be provided through hospice.  She is agreeable to speaking about this with outpatient palliative based on his progress once he returns home.  Patient agrees he would not be in best interest of the patient to come back and forth to  the hospital for intervention such as IV fluids, but rather would prefer to support him at home and allow the natural disease process to continue as expected.   The difference  between aggressive medical intervention and comfort care was considered in light of the patient's goals of care. Hospice and Palliative Care services outpatient were explained and offered.   Discussed the importance of continued conversation with family and the medical providers regarding overall plan of care and treatment options, ensuring decisions are within the context of the patient's values and GOCs.   Questions and concerns were addressed.  Hard Choices booklet left for review. The family was encouraged to call with questions or concerns.  PMT will continue to support holistically.    SUMMARY OF RECOMMENDATIONS   -Continue DNR/DNI -Continue current care, avoid aggressive interventions with goal to return patient home with home health -Patient sister would like to resume outpatient palliative care services.  Discussed with TOC -Patient and family would benefit from ongoing goals of care discussions and hospice support -PMT remains available as needed   Prognosis:  Poor long-term prognosis, hospice appropriate when aligned with goals of care  Discharge Planning: Home with Palliative Services      Primary Diagnoses: Present on Admission:  (Resolved) Hyperosmolar hyperglycemic state (HHS) (HCC)  Protein-calorie malnutrition (HCC)    Physical Exam Vitals and nursing note reviewed.  Constitutional:      General: He is not in acute distress. Cardiovascular:     Rate and Rhythm: Normal rate.  Pulmonary:     Effort: Pulmonary effort is normal.  Neurological:     Mental Status: He is alert. Mental status is at baseline.  Psychiatric:        Behavior: Behavior normal.     Vital Signs: BP (!) 114/59 (BP Location: Left Arm)   Pulse 78   Temp 97.7 F (36.5 C)   Resp (!) 21   Ht 5\' 6"  (1.676 m)   Wt 55 kg   SpO2 93%   BMI 19.57 kg/m  Pain Scale: Faces POSS *See Group Information*: 1-Acceptable,Awake and alert Pain Score: 0-No pain   SpO2: SpO2: 93 % O2  Device:SpO2: 93 % O2 Flow Rate: .O2 Flow Rate (L/min): 3 L/min   Palliative Assessment/Data: 30%    MDM: high    Zamire Whitehurst Jeni Salles, PA-C  Palliative Medicine Team Team phone # [phone]  Thank you for allowing the Palliative Medicine Team to assist in the care of this patient. Please utilize secure chat with additional questions, if there is no response within 30 minutes please call the above phone number.  Palliative Medicine Team providers are available by phone from 7am to 7pm daily and can be reached through the team cell phone.  Should this patient require assistance outside of these hours, please call the patient's attending physician.

## 2022-11-16 ENCOUNTER — Inpatient Hospital Stay (HOSPITAL_COMMUNITY): Payer: 59

## 2022-11-16 ENCOUNTER — Other Ambulatory Visit (HOSPITAL_COMMUNITY): Payer: Self-pay

## 2022-11-16 DIAGNOSIS — E1100 Type 2 diabetes mellitus with hyperosmolarity without nonketotic hyperglycemic-hyperosmolar coma (NKHHC): Secondary | ICD-10-CM | POA: Diagnosis not present

## 2022-11-16 DIAGNOSIS — E878 Other disorders of electrolyte and fluid balance, not elsewhere classified: Secondary | ICD-10-CM | POA: Insufficient documentation

## 2022-11-16 DIAGNOSIS — Z7189 Other specified counseling: Secondary | ICD-10-CM | POA: Diagnosis not present

## 2022-11-16 DIAGNOSIS — E1065 Type 1 diabetes mellitus with hyperglycemia: Secondary | ICD-10-CM

## 2022-11-16 DIAGNOSIS — E876 Hypokalemia: Secondary | ICD-10-CM | POA: Insufficient documentation

## 2022-11-16 DIAGNOSIS — Z515 Encounter for palliative care: Secondary | ICD-10-CM | POA: Diagnosis not present

## 2022-11-16 HISTORY — DX: Type 2 diabetes mellitus with hyperosmolarity without nonketotic hyperglycemic-hyperosmolar coma (NKHHC): E11.00

## 2022-11-16 LAB — BASIC METABOLIC PANEL
Anion gap: 10 (ref 5–15)
Anion gap: 13 (ref 5–15)
BUN: 30 mg/dL — ABNORMAL HIGH (ref 8–23)
BUN: 29 mg/dL — ABNORMAL HIGH (ref 8–23)
CO2: 21 mmol/L — ABNORMAL LOW (ref 22–32)
CO2: 15 mmol/L — ABNORMAL LOW (ref 22–32)
Calcium: 9 mg/dL (ref 8.9–10.3)
Calcium: 10.1 mg/dL (ref 8.9–10.3)
Chloride: 122 mmol/L — ABNORMAL HIGH (ref 98–111)
Chloride: 124 mmol/L — ABNORMAL HIGH (ref 98–111)
Creatinine, Ser: 1.31 mg/dL — ABNORMAL HIGH (ref 0.61–1.24)
Creatinine, Ser: 1.17 mg/dL (ref 0.61–1.24)
GFR, Estimated: 55 mL/min — ABNORMAL LOW (ref >60)
GFR, Estimated: >60 mL/min (ref >60)
Glucose, Bld: 366 mg/dL — ABNORMAL HIGH (ref 70–99)
Glucose, Bld: 301 mg/dL — ABNORMAL HIGH (ref 70–99)
Potassium: 2.9 mmol/L — ABNORMAL LOW (ref 3.5–5.1)
Potassium: 3.6 mmol/L (ref 3.5–5.1)
Sodium: 153 mmol/L — ABNORMAL HIGH (ref 135–145)
Sodium: 152 mmol/L — ABNORMAL HIGH (ref 135–145)

## 2022-11-16 LAB — GLUCOSE, CAPILLARY
Glucose-Capillary: 424 mg/dL — ABNORMAL HIGH (ref 70–99)
Glucose-Capillary: 323 mg/dL — ABNORMAL HIGH (ref 70–99)
Glucose-Capillary: 192 mg/dL — ABNORMAL HIGH (ref 70–99)
Glucose-Capillary: 245 mg/dL — ABNORMAL HIGH (ref 70–99)
Glucose-Capillary: 222 mg/dL — ABNORMAL HIGH (ref 70–99)

## 2022-11-16 LAB — BLOOD GAS, VENOUS
Acid-base deficit: 0.3 mmol/L (ref 0.0–2.0)
Bicarbonate: 24.1 mmol/L (ref 20.0–28.0)
O2 Saturation: 96.2 %
Patient temperature: 37
pCO2, Ven: 38 mmHg — ABNORMAL LOW (ref 44–60)
pH, Ven: 7.41 (ref 7.25–7.43)
pO2, Ven: 76 mmHg — ABNORMAL HIGH (ref 32–45)

## 2022-11-16 LAB — MAGNESIUM
Magnesium: 2 mg/dL (ref 1.7–2.4)
Magnesium: 2 mg/dL (ref 1.7–2.4)

## 2022-11-16 LAB — TROPONIN I (HIGH SENSITIVITY)
Troponin I (High Sensitivity): 53 ng/L — ABNORMAL HIGH (ref <18)
Troponin I (High Sensitivity): 59 ng/L — ABNORMAL HIGH (ref <18)

## 2022-11-16 MED ORDER — POTASSIUM CHLORIDE 10 MEQ/100ML IV SOLN
10.0000 meq | INTRAVENOUS | Status: AC
  Administered 2022-11-16 (×4): 10 meq via INTRAVENOUS
  Filled 2022-11-16 (×4): qty 100

## 2022-11-16 MED ORDER — ACETAMINOPHEN 500 MG PO TABS
1000.0000 mg | ORAL_TABLET | Freq: Four times a day (QID) | ORAL | Status: DC | PRN
  Administered 2022-11-16 – 2022-11-17 (×2): 1000 mg via ORAL
  Filled 2022-11-16 (×2): qty 2

## 2022-11-16 MED ORDER — ACCU-CHEK SOFTCLIX LANCETS MISC
1.0000 | Freq: Three times a day (TID) | 0 refills | Status: DC
  Filled 2022-11-16: qty 100, 30d supply, fill #0

## 2022-11-16 MED ORDER — BLOOD GLUCOSE TEST VI STRP
1.0000 | ORAL_STRIP | Freq: Three times a day (TID) | 0 refills | Status: DC
  Filled 2022-11-16: qty 100, 30d supply, fill #0

## 2022-11-16 MED ORDER — INSULIN GLARGINE 100 UNIT/ML SOLOSTAR PEN
10.0000 [IU] | PEN_INJECTOR | Freq: Every day | SUBCUTANEOUS | 0 refills | Status: DC
  Filled 2022-11-16: qty 15, 150d supply, fill #0

## 2022-11-16 MED ORDER — EMPAGLIFLOZIN 25 MG PO TABS
25.0000 mg | ORAL_TABLET | Freq: Every day | ORAL | 0 refills | Status: DC
  Filled 2022-11-16: qty 30, 30d supply, fill #0

## 2022-11-16 MED ORDER — INSULIN PEN NEEDLE 32G X 4 MM MISC
1.0000 | Freq: Three times a day (TID) | 0 refills | Status: DC
  Filled 2022-11-16: qty 100, 30d supply, fill #0

## 2022-11-16 MED ORDER — SODIUM CHLORIDE 0.45 % IV SOLN: INTRAVENOUS | Status: AC

## 2022-11-16 MED ORDER — BLOOD GLUCOSE MONITOR SYSTEM W/DEVICE KIT
1.0000 | PACK | Freq: Three times a day (TID) | 0 refills | Status: DC
  Filled 2022-11-16: qty 1, 30d supply, fill #0

## 2022-11-16 MED ORDER — IOHEXOL 350 MG/ML SOLN
75.0000 mL | Freq: Once | INTRAVENOUS | Status: AC | PRN
  Administered 2022-11-16: 75 mL via INTRAVENOUS

## 2022-11-16 MED ORDER — METFORMIN HCL ER 500 MG PO TB24
500.0000 mg | ORAL_TABLET | Freq: Every day | ORAL | 0 refills | Status: DC
  Filled 2022-11-16: qty 30, 30d supply, fill #0

## 2022-11-16 MED ORDER — INSULIN GLARGINE-YFGN 100 UNIT/ML ~~LOC~~ SOLN
10.0000 [IU] | Freq: Every day | SUBCUTANEOUS | Status: DC
  Administered 2022-11-16 – 2022-11-17 (×2): 10 [IU] via SUBCUTANEOUS
  Filled 2022-11-16 (×2): qty 0.1

## 2022-11-16 MED ORDER — LANCET DEVICE MISC
1.0000 | Freq: Three times a day (TID) | 0 refills | Status: DC
  Filled 2022-11-16: qty 1, fill #0

## 2022-11-16 MED ORDER — POTASSIUM CHLORIDE 20 MEQ PO PACK
20.0000 meq | PACK | Freq: Once | ORAL | Status: AC
  Administered 2022-11-16: 20 meq via ORAL
  Filled 2022-11-16: qty 1

## 2022-11-16 MED ORDER — LIDOCAINE 5 % EX PTCH
1.0000 | MEDICATED_PATCH | CUTANEOUS | Status: DC
  Administered 2022-11-16: 1 via TRANSDERMAL
  Filled 2022-11-16: qty 1

## 2022-11-16 NOTE — Plan of Care (Signed)
  Problem: Education: Goal: Knowledge of General Education information will improve Description: Including pain rating scale, medication(s)/side effects and non-pharmacologic comfort measures Outcome: Progressing   Problem: Health Behavior/Discharge Planning: Goal: Ability to manage health-related needs will improve Outcome: Progressing   Problem: Clinical Measurements: Goal: Ability to maintain clinical measurements within normal limits will improve Outcome: Progressing Goal: Will remain free from infection Outcome: Progressing Goal: Diagnostic test results will improve Outcome: Progressing Goal: Respiratory complications will improve Outcome: Progressing Goal: Cardiovascular complication will be avoided Outcome: Progressing   Problem: Activity: Goal: Risk for activity intolerance will decrease Outcome: Progressing   Problem: Nutrition: Goal: Adequate nutrition will be maintained Outcome: Progressing   Problem: Coping: Goal: Level of anxiety will decrease Outcome: Progressing   Problem: Elimination: Goal: Will not experience complications related to bowel motility Outcome: Progressing Goal: Will not experience complications related to urinary retention Outcome: Progressing   Problem: Pain Managment: Goal: General experience of comfort will improve Outcome: Progressing   Problem: Safety: Goal: Ability to remain free from injury will improve Outcome: Progressing   Problem: Skin Integrity: Goal: Risk for impaired skin integrity will decrease Outcome: Progressing   Problem: Coping: Goal: Ability to adjust to condition or change in health will improve Outcome: Progressing   Problem: Fluid Volume: Goal: Ability to maintain a balanced intake and output will improve Outcome: Progressing   Problem: Health Behavior/Discharge Planning: Goal: Ability to manage health-related needs will improve Outcome: Progressing   Problem: Metabolic: Goal: Ability to maintain  appropriate glucose levels will improve Outcome: Progressing   Problem: Nutritional: Goal: Maintenance of adequate nutrition will improve Outcome: Progressing   Problem: Skin Integrity: Goal: Risk for impaired skin integrity will decrease Outcome: Progressing   Problem: Tissue Perfusion: Goal: Adequacy of tissue perfusion will improve Outcome: Progressing

## 2022-11-16 NOTE — Progress Notes (Signed)
Daily Progress Note   Patient Name: Rick Schwartz       Date: 11/16/2022 DOB: Nov 13, 1940  Age: 82 y.o. MRN#: [id number] Attending Physician: Doreene Eland, MD Primary Care Physician: Vonna Drafts, MD Admit Date: 11/10/2022  Reason for Consultation/Follow-up: Establishing goals of care  Subjective: Medical records reviewed including progress notes, labs and imaging. Patient assessed at the bedside. He is sleeping comfortably. Did not arouse in order to preserve comfort. No family present.  Called patients sister/guardian [name] to provide palliative support. She shares that yesterday's conversation was very helpful and she has no further questions at this time. She appreciates the guidance in navigating different resources and treatment options.  We discussed ongoing management of patient's sodium and how it seems "it is always one thing after another." Patient was certainly looking forward to returning home. [name] understands how these complications and delays are related to patient's ongoing dysphagia and poor oral intake, as well as how this may likely be a recurring problem. Encouraged ongoing GOC discussions and consideration of potential need for hospice sooner rather than later, if he does not improve or does not wish to remain hospitalized for treatment. She is appreciative and agrees this would be beneficial.   PMT will continue to support holistically.   Length of Stay: 5   Physical Exam Vitals and nursing note reviewed.  Constitutional:      General: He is sleeping. He is not in acute distress.    Appearance: He is ill-appearing.  Cardiovascular:     Rate and Rhythm: Normal rate.  Pulmonary:     Effort: Pulmonary effort is normal.  Neurological:     Mental Status:  Mental status is at baseline.            Vital Signs: BP (!) 105/51 (BP Location: Left Arm)   Pulse 94   Temp 98 F (36.7 C) (Axillary)   Resp 20   Ht 5\' 6"  (1.676 m)   Wt 53.9 kg   SpO2 99%   BMI 19.18 kg/m  SpO2: SpO2: 99 % O2 Device: O2 Device: Room Air O2 Flow Rate: O2 Flow Rate (L/min): 3 L/min      Palliative Assessment/Data: 30%   Palliative Care Assessment & Plan   Patient Profile:  82 y.o. male  with past medical history of  spastic quadriplegic cerebral palsy, HTN, BPH, protein calorie malnutrition, prostate cancer, prediabetes, HFrEF, PE 2022 on Eliquis, admitted on 11/10/2022 with altered mental status and increased urinary frequency.    Patient was admitted with HHS and during course of hospitalization was noted to have severe dysphagia with concern for ability to tolerate adequate p.o. intake. PMT has been consulted to assist with "end-of-life versus goals of care conversation."  Assessment: Goals of care conversation Hypernatremia Severe dysphagia HHS AKI on CKD3a  Recommendations/Plan: Continue DNR/DNI Continue current care, avoid aggressive interventions Goal remains to return patient home with home health and outpatient palliative care Psychosocial and emotional support provided PMT remains available as needed   Prognosis:  Poor long-term prognosis, hospice appropriate when aligned with goals of care   Discharge Planning: Home with Palliative Services  Care plan was discussed with [name]/guardian   Total time: I spent 35 minutes in the care of the patient today in the above activities and documenting the encounter.          Kyoko Elsea Jeni Salles, PA-C  Palliative Medicine Team Team phone # [phone]  Thank you for allowing the Palliative Medicine Team to assist in the care of this patient. Please utilize secure chat with additional questions, if there is no response within 30 minutes please call the above phone  number.  Palliative Medicine Team providers are available by phone from 7am to 7pm daily and can be reached through the team cell phone.  Should this patient require assistance outside of these hours, please call the patient's attending physician.

## 2022-11-16 NOTE — Progress Notes (Addendum)
FMTS Brief Progress Note  S:Assessed patient at bedside with Dr. Sharion Dove due to report earlier in the evening that he was having some tachypnea. Previously seen by Dr. Marsh Dolly who obtained a CXR that looks more or less unchanged from prior.  Tele reviewed with frequent PVCs. On exam he is comfortable appearing and interacting at baseline. Is able to affirm that he is not having any pain nor difficulty breathing. Specifically denies any chest pain. Denies any needs at present.  Observed patient breathing for a while he has periods of comfortable tachypnea to the 40s lasting ~10 seconds and interspersed with periods of normal appearing respirations with rates in the low 20s for ~20 seconds. He does not appear uncomfortable or to be in pain. The remainder of his vitals are all WNL and he is maintaining excellent O2 sats on room air.   O: BP (!) 110/55 (BP Location: Left Arm)   Pulse 81   Temp 97.6 F (36.4 C) (Oral)   Resp 20   Ht 5\' 6"  (1.676 m)   Wt 53.9 kg   SpO2 97%   BMI 19.18 kg/m   Gen: Elderly, frail, chronically ill appearing but awakens easily to voice and engages conversation. Answers are short and 1-2 words but are intelligible.  Cardio: Regular rate and rhythm interrupted by frequent PVCs correlated with findings on monitor Resp: Intermittent periods of comfortable tachypnea interspersed with periods of regular breathing.   A/P: Uncertain cause of his intermittent tachypnea at this time. Differential is broad and he has multiple risk factors for cardiac,respiratory, and CNS causes. CXR and exam argue against volume overload/CHF, though he has been getting IVF at an increased rate over the course of the day to treat his hypernatremia.  The most likely culprit would seem to be an aspiration event in this patient with significant dysphagia without evidence on imaging at present. Overall I am reassured by his baseline mental status. In fact, this is my third encounter with him this  hospitalization and this is the clearest I have seen him mentating. Other considerations: Possible he has had recurrence of his HHS/DKA pathophysiology though breathing pattern is not entirely typical of [place] respirations.  Could also consider myocardial insult leading to PVCs and respiratory symptoms, though in the absence of any chest pain this seems less likely. Considered PE but with a Wells Score of just 1.5 (for prior PE), seems unlikely. I suppose there's a possibility of recurrence of his prostate CA, but we've not gone down that pathway this hospital.  The other consideration would be neurogenic Cheyne-Stokes breathing. The pattern of his breathing is not entirely consistent with this but he does have CNS insult at baseline. There is a non-zero possibility that despite our best interventions, he may be approaching the end of his life. But for now we will continue to work up and treat what we can while respecting his desires to avoid heroic/overly invasive measures as discussed with palliative care.  - BMP, Mag, VBG, Troponins - If above workup is unrevealing and he develops other vital sign abnormalities, would obtain CTA PE Study.    ADDENDUM 2205 Troponin reassuringly negative.  Gas seems to show a primary respiratory alkalosis that is being compensated by a secondary metabolic acidosis. Primary respiratory alkalosis would argue again for a PE. Given this finding, I think it prudent to go ahead and obtain a CTA PE study  as this would give [place] a therapeutic target.   Alicia Amel, MD 11/16/2022, [phone]  PM PGY-3, [facility] Family Medicine Night Resident  Please page [phone] with questions.

## 2022-11-16 NOTE — Assessment & Plan Note (Deleted)
Chronic issue - AM Cr 1.31

## 2022-11-16 NOTE — Plan of Care (Signed)

## 2022-11-16 NOTE — Assessment & Plan Note (Deleted)
Patient overnight BMP 156(160 corrected for hyperglycemia), Increased 1/2 NS rate to 150 cc/hr AM Na 153 (159 correct for hyperglycemia

## 2022-11-16 NOTE — Progress Notes (Signed)
Increasing in RR, more frequent PVC noted. Pt is more restless and agitated. MD notified, ( will address new verbal orders: Xray, tylenol, Lidocaine, EKG and bladder scan)

## 2022-11-16 NOTE — Assessment & Plan Note (Deleted)
Patient cachetic on exam.  - SLP following, dys 1 fluid diet  w/ nectar thick fluids  - Modified barium swallow, concern for severe dysphagia and inability to maintain proper PO intake, recommend HH SLP services at discharge - Palliative, guardian and patient desire outpatient palliative services and would like to consider options at that time - Fall precautions - Delirium precautions

## 2022-11-16 NOTE — Assessment & Plan Note (Deleted)
Hx of prediabetes, last A1c 5.5 in 2022. Most recent A1c 10.6. Limited PO intake, currently on PO DM meds and basal. BS 240s-420s past 24 hours, determining regimen challenging in the setting of patients varying PO intake and severe dysphagia  - BG checks q4h - K 2.9, repleted 10 mEq IV x4  and 20 mEq  - Metformin 500 mg daily and Jardiance 25 mg daily  - semglee 5 units - mSSI novolog

## 2022-11-16 NOTE — Progress Notes (Signed)
Daily Progress Note Intern Pager: [phone]  Patient name: Rick Schwartz Medical record number: [id number] Date of birth: 01-12-1941 Age: 82 y.o. Gender: male  Primary Care Provider: Vonna Drafts, MD Consultants: None Code Status: DNR  Pt Overview and Major Events to Date:  8/25 Admitted and endotool d/c'ed  8/26 Started puree diet, sSSI,  8/27 Started Jardiance 8/28 Metformin 8/29 Palliative, Increased Jardiance, mSSI and semglee added  8/30 Increased Semglee to 10 units, discuss dispo with guardian   Assessment and Plan: KHRIS FRISCHMAN is a 82 y.o. 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 SLP following and recommended [facility] w/ SLP services for discharge. Blood sugar range 360s-190s past 24 hours. Increased semglee to 10 units and will get this morning. Consider PM discharge today vs tomorrow pending dispo discussion with sister (guardian).  Assessment & Plan Protein-calorie malnutrition [facility]) Patient cachetic on exam.  - SLP following, dys 1 fluid diet  w/ nectar thick fluids  - Modified barium swallow, concern for severe dysphagia and inability to maintain proper PO intake, recommend HH SLP services at discharge - Dys 1 diet, w/ thick nectar like fluids  - Palliative, guardian and patient desire outpatient palliative services and would like to consider options at that time - Fall precautions - Delirium precaution Hypernatremia Patient overnight BMP 156(160 corrected for hyperglycemia), Increased 1/2 NS rate to 150 cc/hr overnight AM Na 153 (159 correct for hyperglycemia)  AM BMP if stays overnight  Diabetes mellitus without complication (HCC) Hx of prediabetes, last A1c 5.5 in 2022. Most recent A1c 10.6. Limited PO intake, currently on PO DM meds and basal. BS 240s-420s past 24 hours, determining regimen challenging in the setting of patients varying PO intake and severe dysphagia. Increased basal this morning, to  optimize control.  - BG checks q4h - K 2.9, repleted 10 mEq IV x4  and 20 mEq  - Metformin 500 mg daily and Jardiance 25 mg daily  - Increase semglee  to 10 units, will get first dose this AM  Acute renal failure superimposed on stage 3a chronic kidney disease (HCC) Chronic issue - AM Cr 1.31  FEN/GI: Dys 1  PPx: Eliquis  Dispo:Home  pending guardian comfortability with disposition . Barriers include decreased PO intake, hypernatremia and glucose control   Subjective:  Patient would like to go home. Doesn't complain of any pain.   Objective: Temp:  [98 F (36.7 C)-98.8 F (37.1 C)] 98 F (36.7 C) (08/30 0700) Pulse Rate:  [83-100] 94 (08/30 0524) Resp:  [15-20] 20 (08/30 0700) BP: (105-151)/(51-89) 105/51 (08/30 0700) SpO2:  [96 %-99 %] 99 % (08/30 0700) Weight:  [118 lb 13.3 oz (53.9 kg)] 118 lb 13.3 oz (53.9 kg) (08/30 0422) Physical Exam: General: cachetic appearing, propped up in bed, responds to voice and touch, s/p right AKA.  Cardiovascular: irregular rhythm on exam, no murmurs appreciated  Respiratory: NWOB, satting well on RA  Gastrointestinal: bowel sounds present, soft, non tender, no hepatosplenomegaly  MSK: poor muscle tone with diffuse atrophy, right hand w/ contracture  Neuro: Mentation back to baseline, nonverbal at baseline,answer 1-3 words   Laboratory: Most recent CBC Lab Results  Component Value Date   WBC 9.0 11/13/2022   HGB 13.4 11/13/2022   HCT 42.4 11/13/2022   MCV 91.2 11/13/2022   PLT 163 11/13/2022       Latest Ref Rng & Units 11/16/2022    7:07  AM 11/15/2022    6:42 PM 11/15/2022    3:08 AM  BMP  Glucose 70 - 99 mg/dL 630  160  109   BUN 8 - 23 mg/dL 30  27  21    Creatinine 0.61 - 1.24 mg/dL 3.23  5.57  3.22   Sodium 135 - 145 mmol/L 153  156  151   Potassium 3.5 - 5.1 mmol/L 2.9  3.5  2.9   Chloride 98 - 111 mmol/L 122  124  118   CO2 22 - 32 mmol/L 21  23  22    Calcium 8.9 - 10.3 mg/dL 9.0  9.8  9.6      Other pertinent labs    BS  257 > 2246> 323 >366  Imaging/Diagnostic Tests: None   Peterson Ao, MD 11/16/2022, 11:17 AM  PGY-1, [facility] Family Medicine FPTS Intern pager: [phone], text pages welcome Secure chat group [facility] Teaching Service

## 2022-11-16 NOTE — Assessment & Plan Note (Signed)
Patient overnight BMP 156(160 corrected for hyperglycemia), Increased 1/2 NS rate to 150 cc/hr overnight AM Na 153 (159 correct for hyperglycemia)  AM BMP if stays overnight

## 2022-11-16 NOTE — Assessment & Plan Note (Signed)
Patient cachetic on exam.  - SLP following, dys 1 fluid diet  w/ nectar thick fluids  - Modified barium swallow, concern for severe dysphagia and inability to maintain proper PO intake, recommend HH SLP services at discharge - Dys 1 diet, w/ thick nectar like fluids  - Palliative, guardian and patient desire outpatient palliative services and would like to consider options at that time - Fall precautions - Delirium precaution

## 2022-11-16 NOTE — Assessment & Plan Note (Signed)
Chronic issue - AM Cr 1.31

## 2022-11-16 NOTE — Inpatient Diabetes Management (Signed)
Inpatient Diabetes Program Recommendations  AACE/ADA: New Consensus Statement on Inpatient Glycemic Control (2015)  Target Ranges:  Prepandial:   less than 140 mg/dL      Peak postprandial:   less than 180 mg/dL (1-2 hours)      Critically ill patients:  140 - 180 mg/dL   Lab Results  Component Value Date   GLUCAP 323 (H) 11/16/2022   HGBA1C 10.6 (H) 11/10/2022    Latest Reference Range & Units 11/15/22 06:13 11/15/22 10:42 11/15/22 15:55 11/15/22 21:07 11/16/22 06:34  Glucose-Capillary 70 - 99 mg/dL 960 (H) 454 (H) 098 (H) 424 (H) 323 (H)  (H): Data is abnormally high Review of Glycemic Control  Diabetes history: type 2 Outpatient Diabetes medications: none Current orders for Inpatient glycemic control: Novolog 0-9 units correction scale TID, Jardiance 25 mg daily, Metformin 500 mg daily  Inpatient Diabetes Program Recommendations:   Noted that blood sugars have been greater than 300 mg/dl.   If blood sugars continue to be elevated, recommend increasing Semglee to 10 units daily, increase Novolog correction scale to 0-15 units TID, add Novolog 3 units TID with meals if patient eats at least 50% of meals.  Smith Mince RN BSN CDE Diabetes Coordinator Pager: [phone]  8am-5pm

## 2022-11-16 NOTE — Assessment & Plan Note (Signed)
Hx of prediabetes, last A1c 5.5 in 2022. Most recent A1c 10.6. Limited PO intake, currently on PO DM meds and basal. BS 240s-420s past 24 hours, determining regimen challenging in the setting of patients varying PO intake and severe dysphagia. Increased basal this morning, to optimize control.  - BG checks q4h - K 2.9, repleted 10 mEq IV x4  and 20 mEq  - Metformin 500 mg daily and Jardiance 25 mg daily  - Increase semglee  to 10 units, will get first dose this AM

## 2022-11-16 NOTE — Progress Notes (Signed)
FMTS Interim Progress Note  S: Nurse messaged me that patient seems to be in pain.  Message saying that his respiratory rate was elevated and patient seemed uncomfortable.  I have visited patient at the bedside.  He did seem uncomfortable and was grimacing and slightly agitated.  He is very difficult to understand due to his cerebral palsy and dementia.  Nurse said that patient ate very well at lunchtime and he started acting more differently after lunch.  She said that he does seem more rhonchorous and coarse breathing now than he was previous to this.  She also agrees that he seems more agitated as well.  Nurse also says that he did have a loose bowel movement today.  Says the they do not think that he is constipated.  O: BP 132/79 (BP Location: Left Arm)   Pulse 86   Temp 97.8 F (36.6 C) (Axillary)   Resp 20   Ht 5\' 6"  (1.676 m)   Wt 53.9 kg   SpO2 95%   BMI 19.18 kg/m   General: Chronically ill, uncomfortable, in no acute distress CV: Normal rate, occasional PVCs, radial pulses palpable Resp: Coarse breath sounds audible without stethoscope, tachypneic to 44 breaths/min, appears to have increased work of breathing, rhonchorous throughout bases and mid level of lungs Abdomen: Distended abdomen, no pain or grimacing to palpation, bladder scan of 193 mL Neuro: Alert, seems agitated seems to repeat home a couple times, but denies to pain difficult to understand, or dysarthric   A/P: Tachypnea and agitation Unsure why patient is tachypneic.  However patient does have dysphagia and was a major aspiration risk.  Per family wanted to remain on diet.  Likely that patient aspirated and might be having tachypnea for this reason.  He also is agitated possible that patient also has delirium caused by hospital environment as well as wanting to go home.  He does also have abdominal distention he did have a loose bowel movement today and not constipated per nurse but could be having encopresis.  Not  as concerned regarding abdominal distention as upon palpating patient does not have tenderness unlikely acute abdominal problem. - EKG was similar to prior - Follow-up chest x-ray - Patient not requiring oxygen at this time we will continue to monitor vital signs and respiratory status - Will avoid antiagitation medications at this time as do not want to repress respiratory drive.  Lockie Mola, MD 11/16/2022, 6:24 PM PGY-2, [facility] Family Medicine Service pager [phone]

## 2022-11-17 ENCOUNTER — Other Ambulatory Visit (HOSPITAL_COMMUNITY): Payer: Self-pay

## 2022-11-17 DIAGNOSIS — E1165 Type 2 diabetes mellitus with hyperglycemia: Secondary | ICD-10-CM | POA: Diagnosis not present

## 2022-11-17 DIAGNOSIS — Z794 Long term (current) use of insulin: Secondary | ICD-10-CM

## 2022-11-17 DIAGNOSIS — E870 Hyperosmolality and hypernatremia: Secondary | ICD-10-CM | POA: Diagnosis not present

## 2022-11-17 LAB — BASIC METABOLIC PANEL
Anion gap: 7 (ref 5–15)
BUN: 24 mg/dL — ABNORMAL HIGH (ref 8–23)
CO2: 22 mmol/L (ref 22–32)
Calcium: 8.9 mg/dL (ref 8.9–10.3)
Chloride: 123 mmol/L — ABNORMAL HIGH (ref 98–111)
Creatinine, Ser: 1.12 mg/dL (ref 0.61–1.24)
GFR, Estimated: >60 mL/min (ref >60)
Glucose, Bld: 234 mg/dL — ABNORMAL HIGH (ref 70–99)
Potassium: 3.3 mmol/L — ABNORMAL LOW (ref 3.5–5.1)
Sodium: 152 mmol/L — ABNORMAL HIGH (ref 135–145)

## 2022-11-17 LAB — GLUCOSE, CAPILLARY
Glucose-Capillary: 185 mg/dL — ABNORMAL HIGH (ref 70–99)
Glucose-Capillary: 191 mg/dL — ABNORMAL HIGH (ref 70–99)
Glucose-Capillary: 185 mg/dL — ABNORMAL HIGH (ref 70–99)

## 2022-11-17 MED ORDER — POTASSIUM CHLORIDE 10 MEQ/100ML IV SOLN
10.0000 meq | INTRAVENOUS | Status: AC
  Administered 2022-11-17 (×4): 10 meq via INTRAVENOUS
  Filled 2022-11-17 (×4): qty 100

## 2022-11-17 NOTE — Discharge Summary (Signed)
Family Medicine Teaching [facility] Discharge Summary  Patient name: Rick Schwartz Medical record number: [id number] Date of birth: 01/03/1941 Age: 82 y.o. Gender: male Date of Admission: 11/10/2022  Date of Discharge: 11/17/22  Admitting Physician: Glendale Chard, DO  Primary Care Provider: Vonna Drafts, MD Consultants: SLP, PMT  Indication for Hospitalization: DKA  Brief Hospital Course:  Rick Schwartz is a 82 y.o.male with a history of  cerebral palsy, PE, HFrEF who was admitted to the [facility] Teaching Service at [facility] for altered mental status, limited PO intake and found in a hyperosmolar hyperglycemic state. His hospital course is detailed below:  Hyperosmolar hyperglycemic state (HHS) (HCC) Patient's blood sugar was elevated at 939, potassium 3.0, pH of 7.4, bicarb 37.4, beta Hydroxybutyric 4.6 and an anion gap of 26 and A1c of 10.6. The patient was started on IV fluids and  IV insulin. After gap closed x 2, the patient was transitioned off IV insulin onto 10 units of Semglee. In the setting of severe dysphagia limited p.o. intake, semglee was held and his IV fluids were adjusted according to labs and maintained. Blood sugars were managed on sliding scale insulin. Goals of care conversation occurred with sister(guardian), [name], who felt comfortable managing patients diabetes at home. Discontinued IV fluids prior to discharge and the patient was tolerating pured diet.  Patients regimen was metformin 500 mg, jardiance 25 mg and semglee 10 units at discharge.  The patient was intermittently on various sliding scale insulins and fluid resuscitation due to inconsistent oral intake. Glucose levels in range at discharge.    Dysphagia/Malnutrition Patient failed bedside swallow study, after transitioning off IV insulin.  Speech pathology was consulted for evaluation and recommended a pured solids and nectar thick liquids.  Discussed difficulty swallowing with guardian, who states  patient was on a pured diet at home.  Barium swallow study was notable for severe dysphagia and recommended speech language therapy with home health. Discussion was had with guardian about challenges with treating hypernatremia in the setting of his decreased p.o. intake and difficulty swallowing. He is most likely chronically aspirating. Palliative was consulted in the setting of decreased oral intake and severe dysphagia, and the patient's guardian desired to do home health for swallowing and would consider outpatient palliative services.   Elevated troponin EKG with multiple PVCs and troponin elevated to 356, discussed EKG and troponin findings with on call cardiology fellow who recommended trending trops.  Last trop was 301, suspected to be due to demand ischemia in setting of elevated blood glucose.   Electrolyte Abnormalities  Patient was intermittently hypokalemic throughout hospitalization and they were repleted as needed via IV or oral medication.  Patient was also hypernatremic after transitioning to p.o. metformin, jardiance and sliding scale insulin.  The patient was resuscitated with fluids throughout admission.    Other chronic conditions were medically managed with home medications and formulary alternatives as necessary (cerebral palsy, PAD, HTN, HLD, PE, HFrEF) Eliquis 5 mg BID  Lasix 20 mg daily (held)  K-lor 10 mEq daily (held)  Crestor 20 mg daily (held)   PCP Follow-up Recommendations: Repeat UA at follow up for hematuria.  Follow up BMP, to assess electrolytes  Follow up blood sugars and diabetic regimen with new found diabetes.  Consider discontinuing crestor at this point as may not benefit much from mortality.   Discharge Diagnoses/Problem List:  T2DM  PAD HTN  PE  HFrEF  Disposition: Home with home health and outpatient palliative   Discharge Condition: stable  Discharge Exam:  General: Chronically ill, elderly, in no acute distress CV: RRR, radial pulses  equal and palpable,  Resp: Normal work of breathing on room air, CTAB, no longer rhoncerous  Abd: Soft, non tender, non distended  MSK: right arm and hand contracture, RLE amputee  Neuro: Alert & unable to completely elucidate orientation difficult to understand due to dysarthria but seems more oriented today.   Significant Procedures: None  Significant Labs and Imaging:  No results for input(s): "WBC", "HGB", "HCT", "PLT" in the last 48 hours. Recent Labs  Lab 11/15/22 1842 11/16/22 0707 11/16/22 2028 11/17/22 0315  NA 156* 153* 152* 152*  K 3.5 2.9* 3.6 3.3*  CL 124* 122* 124* 123*  CO2 23 21* 15* 22  GLUCOSE 331* 366* 301* 234*  BUN 27* 30* 29* 24*  CREATININE 1.38* 1.31* 1.17 1.12  CALCIUM 9.8 9.0 10.1 8.9  MG  --  2.0 2.0  --      Results/Tests Pending at Time of Discharge: None  Discharge Medications:  Allergies as of 11/17/2022   No Known Allergies      Medication List     STOP taking these medications    furosemide 20 MG tablet Commonly known as: LASIX       TAKE these medications    Accu-Chek Softclix Lancets lancets Use as directed to check blood sugarb 3 (three) times daily.   Blood Glucose Monitor System w/Device Kit Use 3 (three) times daily.   BLOOD GLUCOSE TEST STRIPS Strp Use 3 times daily as directed to check blood sugar.   Eliquis 5 MG Tabs tablet Generic drug: apixaban TAKE 1 TABLET(5 MG) BY MOUTH TWICE DAILY What changed: See the new instructions.   empagliflozin 25 MG Tabs tablet Commonly known as: JARDIANCE Take 1 tablet (25 mg total) by mouth daily.   insulin glargine 100 UNIT/ML Solostar Pen Commonly known as: LANTUS Inject 10 Units into the skin daily.   Insulin Pen Needle 32G X 4 MM Misc Use 3 (three) times daily.   Lancet Device Misc 1 each by Does not apply route 3 (three) times daily. May dispense any manufacturer covered by patient's insurance.   metFORMIN 500 MG 24 hr tablet Commonly known as:  GLUCOPHAGE-XR Take 1 tablet (500 mg total) by mouth daily with breakfast.   potassium chloride 10 MEQ CR capsule Commonly known as: MICRO-K TAKE 1 CAPSULE(10 MEQ) BY MOUTH DAILY What changed:  how much to take how to take this when to take this additional instructions   rosuvastatin 20 MG tablet Commonly known as: CRESTOR TAKE 1 TABLET(20 MG) BY MOUTH DAILY What changed:  how much to take how to take this when to take this additional instructions        Discharge Instructions: Please refer to Patient Instructions section of EMR for full details.  Patient was counseled important signs and symptoms that should prompt return to medical care, changes in medications, dietary instructions, activity restrictions, and follow up appointments.   Follow-Up Appointments:  Follow-up Information     [facility] Follow up.   Specialty: Home Health Services Why: Agency will call you to set up apt times for speech therapy Contact information: [address] [place] [address] [phone]                Future Appointments  Date Time Provider Department Center  11/23/2022  9:30 AM Bess Kinds, MD [facility]  09/27/2023  8:45 AM [facility] ANNUAL WELLNESS VISIT  [facility]     Lockie Mola, MD 11/17/2022, 11:55 AM PGY-2, [facility] Health Family Medicine

## 2022-11-17 NOTE — TOC Transition Note (Addendum)
Transition of Care [facility]) - CM/SW Discharge Note   Patient Details  Name: Rick Schwartz MRN: [id number] Date of Birth: 1940-05-19  [facility]) CM/SW Contact:  Lawerance Sabal, RN Phone Number: 11/17/2022, 12:18 PM   Clinical Narrative:     Notified [facility] that patient will DC today. Attempt to call sister [name], [place] not identified, no message left.  Notified [facility] that they would like to restart palliative services, per consult. No other TOC needs identified for DC   Final next level of care: Home w Home Health Services Barriers to Discharge: No Barriers Identified   Patient Goals and CMS Choice CMS Medicare.gov Compare Post Acute Care list provided to:: Patient Represenative (must comment) Choice offered to / list presented to : Sibling  Discharge Placement                         Discharge Plan and Services Additional resources added to the After Visit Summary for   In-house Referral: NA Discharge Planning Services: CM Consult Post Acute Care Choice: Home Health          DME Arranged: N/A DME Agency: NA       HH Arranged: Speech Therapy HH Agency: [facility] Date [facility] Agency Contacted: 11/17/22 Time HH Agency Contacted: 1218 Representative spoke with at [facility] Agency: Laurelyn Sickle  Social Determinants of Health (SDOH) Interventions SDOH Screenings   Food Insecurity: No Food Insecurity (09/24/2022)  Housing: Low Risk  (09/24/2022)  Transportation Needs: No Transportation Needs (09/24/2022)  Utilities: Not At Risk (09/24/2022)  Alcohol Screen: Low Risk  (09/24/2022)  Depression (PHQ2-9): Low Risk  (09/24/2022)  Financial Resource Strain: Low Risk  (09/24/2022)  Physical Activity: Inactive (09/24/2022)  Social Connections: Socially Isolated (09/24/2022)  Stress: No Stress Concern Present (09/24/2022)  Tobacco Use: Medium Risk (11/10/2022)     Readmission Risk Interventions     No data to display

## 2022-11-19 ENCOUNTER — Telehealth: Payer: Self-pay

## 2022-11-19 NOTE — Transitions of Care (Post Inpatient/ED Visit) (Signed)
11/19/2022  Name: Rick Schwartz MRN: [id number] DOB: [birth year]  Today's TOC FU Call Status: Today's TOC FU Call Status:: Successful TOC FU Call Completed TOC FU Call Complete Date: 11/19/22 Patient's Name and Date of Birth confirmed.  Transition Care Management Follow-up Telephone Call Date of Discharge: 11/17/22 Discharge Facility: [name]) Type of Discharge: Inpatient Admission Primary Inpatient Discharge Diagnosis:: DM How have you been since you were released from the hospital?: Better Any questions or concerns?: No  Items Reviewed: Did you receive and understand the discharge instructions provided?: Yes Medications obtained,verified, and reconciled?: Yes (Medications Reviewed) Any new allergies since your discharge?: No Dietary orders reviewed?: Yes Do you have support at home?: Yes People in Home: sibling(s)  Medications Reviewed Today: Medications Reviewed Today     Reviewed by Karena Addison, LPN (Licensed Practical Nurse) on 11/19/22 at [phone]  Med List Status: <None>   Medication Order Taking? Sig Documenting Provider Last Dose Status Informant  Accu-Chek Softclix Lancets lancets [id number]  Use as directed to check blood sugarb 3 (three) times daily. Shelby Mattocks, DO  Active   Blood Glucose Monitoring Suppl (BLOOD GLUCOSE MONITOR SYSTEM) w/Device KIT [id number]  Use 3 (three) times daily. Shelby Mattocks, DO  Active   ELIQUIS 5 MG TABS tablet [id number] No TAKE 1 TABLET(5 MG) BY MOUTH TWICE DAILY  Patient taking differently: Take 5 mg by mouth 2 (two) times daily.   Vonna Drafts, MD 11/10/2022 07:00 am Active Family Member  empagliflozin (JARDIANCE) 25 MG TABS tablet [id number]  Take 1 tablet (25 mg total) by mouth daily. Shelby Mattocks, DO  Active   Glucose Blood (BLOOD GLUCOSE TEST STRIPS) STRP [id number]  Use 3 times daily as directed to check blood sugar. Shelby Mattocks, DO  Active   insulin glargine (LANTUS) 100 UNIT/ML Solostar Pen [id number]  Inject 10  Units into the skin daily. Shelby Mattocks, DO  Active   Insulin Pen Needle 32G X 4 MM MISC [id number]  Use 3 (three) times daily. Shelby Mattocks, DO  Active   Lancet Device MISC [id number]  1 each by Does not apply route 3 (three) times daily. May dispense any manufacturer covered by patient's insurance. Shelby Mattocks, DO  Active   metFORMIN (GLUCOPHAGE-XR) 500 MG 24 hr tablet [id number]  Take 1 tablet (500 mg total) by mouth daily with breakfast. Shelby Mattocks, DO  Active   potassium chloride (MICRO-K) 10 MEQ CR capsule [id number] No TAKE 1 CAPSULE(10 MEQ) BY MOUTH DAILY  Patient taking differently: Take 10 mEq by mouth daily.   Vonna Drafts, MD 11/10/2022 Active Family Member  rosuvastatin (CRESTOR) 20 MG tablet [id number] No TAKE 1 TABLET(20 MG) BY MOUTH DAILY  Patient taking differently: Take 20 mg by mouth daily.   Vonna Drafts, MD 11/10/2022 Active Family Member            Home Care and Equipment/Supplies: Were Home Health Services Ordered?: Yes Name of Home Health Agency:: [facility] Has Agency set up a time to come to your home?: No Any new equipment or medical supplies ordered?: NA  Functional Questionnaire: Do you need assistance with bathing/showering or dressing?: Yes Do you need assistance with meal preparation?: Yes Do you need assistance with eating?: No Do you have difficulty maintaining continence: No Do you need assistance with getting out of bed/getting out of a chair/moving?: Yes Do you have difficulty managing or taking your medications?: Yes  Follow up appointments reviewed: PCP Follow-up appointment confirmed?: Yes Date of PCP follow-up appointment?: 11/23/22 Follow-up Provider:  [facility] Follow-up appointment confirmed?: NA Do you need transportation to your follow-up appointment?: No Do you understand care options if your condition(s) worsen?: Yes-patient verbalized understanding    SIGNATURE Karena Addison, LPN [facility] Nurse Health  Advisor Direct Dial [phone]

## 2022-11-23 ENCOUNTER — Encounter: Payer: Self-pay | Admitting: Student

## 2022-11-23 ENCOUNTER — Ambulatory Visit (INDEPENDENT_AMBULATORY_CARE_PROVIDER_SITE_OTHER): Payer: 59 | Admitting: Student

## 2022-11-23 ENCOUNTER — Other Ambulatory Visit: Payer: Self-pay

## 2022-11-23 VITALS — BP 112/80 | HR 74

## 2022-11-23 DIAGNOSIS — R319 Hematuria, unspecified: Secondary | ICD-10-CM | POA: Insufficient documentation

## 2022-11-23 DIAGNOSIS — Z09 Encounter for follow-up examination after completed treatment for conditions other than malignant neoplasm: Secondary | ICD-10-CM

## 2022-11-23 DIAGNOSIS — Z794 Long term (current) use of insulin: Secondary | ICD-10-CM | POA: Diagnosis not present

## 2022-11-23 DIAGNOSIS — E1165 Type 2 diabetes mellitus with hyperglycemia: Secondary | ICD-10-CM | POA: Diagnosis not present

## 2022-11-23 HISTORY — DX: Hematuria, unspecified: R31.9

## 2022-11-23 NOTE — Assessment & Plan Note (Signed)
Patient comes in for hospital follow-up.  Patient with recent episode of DKA/HHS while admitted inpatient.  Patient had issues with hyponatremia, hypokalemia, and managing sugar.  Sugars have been better since hospitalization and patient taking meds as prescribed.  Will collect BMP today to evaluate for hyponatremia, and hypokalemia. - BMP

## 2022-11-23 NOTE — Progress Notes (Signed)
  SUBJECTIVE:   CHIEF COMPLAINT / HPI:   Hospital F/u -Admitted 8/24-8/31 for DKA, -D/c home regimen of Jardiance 25 mg, Metformin XL 500 mg, Semglee 10 units (she says TID) -Blood in urine?, F/u UA -Struggled w/ hypernatremia and hypokalemia while admitted -Struggled w/ dysphagia, failed swallow eval, placed on puree diet -Palliative Care to follow out pt   Diabetes: Lab Results  Component Value Date   HGBA1C 10.6 (H) 11/10/2022  Meds: Jardiance 25 mg, Metformin XL 500 mg, Semglee 10 units CBG at home: Ranging from 118 - 185 fasting sugars in the am.  Hematuria: Reported during hospitalization and recommended UA on follow up. Care taker notices no blood in urine at this time.   PERTINENT  PMH / PSH:    OBJECTIVE:  BP 112/80   Pulse 74   SpO2 95%  Physical Exam Constitutional:      General: He is not in acute distress.    Appearance: Normal appearance. He is not ill-appearing.  Cardiovascular:     Rate and Rhythm: Normal rate and regular rhythm.     Pulses: Normal pulses.     Heart sounds: Normal heart sounds. No murmur heard.    No friction rub. No gallop.  Pulmonary:     Effort: Pulmonary effort is normal. No respiratory distress.     Breath sounds: Normal breath sounds. No stridor. No wheezing, rhonchi or rales.  Neurological:     Mental Status: He is alert.      ASSESSMENT/PLAN:  Hospital discharge follow-up Assessment & Plan: Patient comes in for hospital follow-up.  Patient with recent episode of DKA/HHS while admitted inpatient.  Patient had issues with hyponatremia, hypokalemia, and managing sugar.  Sugars have been better since hospitalization and patient taking meds as prescribed.  Will collect BMP today to evaluate for hyponatremia, and hypokalemia. - BMP  Orders: -     Basic metabolic panel  Hematuria, unspecified type Assessment & Plan: Patient noted to have hematuria while admitted.  Care provider for patient notes no hematuria at home.  Will  attempt to obtain UA. - UA - Consider urology follow-up  Orders: -     Urinalysis -     POCT urinalysis dipstick  Type 2 diabetes mellitus with hyperglycemia, with long-term current use of insulin [facility]) Assessment & Plan: Patient comes in for hospital follow-up.  Patient's care provide reports good compliance with medications.  Reports taking Jardiance 25, metformin 500 mg, Semglee 10 units 3 times daily.  Patient was supposed to be on Semglee 10 units daily however fasting sugars have ranged from 118-185.  Will recommend higher dose of Semglee, and close follow-up, to titrate diabetes management.  Concern given age for over correcting sugars and making him hypoglycemic.  Given concern with over correcting sugars, and poorly controlled diabetes, will have patient follow-up in 1 week with better log of CBG checks, before meals/fasting. - Semglee 20 units daily - Jardiance 25 mg daily - Metformin XL 500 mg daily - Follow-up 1 week  Orders: -     Basic metabolic panel   No follow-ups on file. Bess Kinds, MD 11/23/2022, 12:30 PM PGY-3, [facility]

## 2022-11-23 NOTE — Assessment & Plan Note (Addendum)
Patient comes in for hospital follow-up.  Patient's care provide reports good compliance with medications.  Reports taking Jardiance 25, metformin 500 mg, Semglee 10 units 3 times daily.  Patient was supposed to be on Semglee 10 units daily however fasting sugars have ranged from 118-185.  Will recommend higher dose of Semglee, and close follow-up, to titrate diabetes management.  Concern given age for over correcting sugars and making him hypoglycemic.  Given concern with over correcting sugars, and poorly controlled diabetes, will have patient follow-up in 1 week with better log of CBG checks, before meals/fasting. - Semglee 20 units daily - Jardiance 25 mg daily - Metformin XL 500 mg daily - Follow-up 1 week

## 2022-11-23 NOTE — Assessment & Plan Note (Signed)
Patient noted to have hematuria while admitted.  Care provider for patient notes no hematuria at home.  Will attempt to obtain UA. - UA - Consider urology follow-up

## 2022-11-23 NOTE — Patient Instructions (Addendum)
It was great to see you! Thank you for allowing me to participate in your care!  I recommend that you always bring your medications to each appointment as this makes it easy to ensure we are on the correct medications and helps [place] not miss when refills are needed.  Our plans for today:  - Diabetes  Take Semglee 20 units, once a day, in the morning  Continue Jardiance 25 mg daily  Continue Metformin 500 mg daily Record Sugars: First thing in morning, before eating, before lunch and before dinner Make Follow up Appointment in 1 week  Checking some labs today to see if signs of dehydration/electrolyte imbalance  - Blood in Urine This was noted during the hospitalization, we will try to collect some urine.  *If can't collect today, make appointment in future, for lab appointment   We are checking some labs today, I will call you if they are abnormal will send you a MyChart message or a letter if they are normal.  If you do not hear about your labs in the next 2 weeks please let us know.  Take care and seek immediate care sooner if you develop any concerns.   Dr. Bess Kinds, MD [facility]

## 2022-11-24 ENCOUNTER — Observation Stay (HOSPITAL_COMMUNITY)
Admission: EM | Admit: 2022-11-24 | Discharge: 2022-11-26 | Disposition: A | Discharge status: Home / Self Care | Payer: 59 | Attending: Family Medicine | Admitting: Family Medicine

## 2022-11-24 ENCOUNTER — Encounter (HOSPITAL_COMMUNITY): Payer: Self-pay | Admitting: Emergency Medicine

## 2022-11-24 ENCOUNTER — Other Ambulatory Visit: Payer: Self-pay

## 2022-11-24 ENCOUNTER — Telehealth: Payer: Self-pay | Admitting: Student

## 2022-11-24 DIAGNOSIS — Z7984 Long term (current) use of oral hypoglycemic drugs: Secondary | ICD-10-CM | POA: Diagnosis not present

## 2022-11-24 DIAGNOSIS — Z794 Long term (current) use of insulin: Secondary | ICD-10-CM | POA: Diagnosis not present

## 2022-11-24 DIAGNOSIS — E1065 Type 1 diabetes mellitus with hyperglycemia: Secondary | ICD-10-CM

## 2022-11-24 DIAGNOSIS — Z7901 Long term (current) use of anticoagulants: Secondary | ICD-10-CM | POA: Diagnosis not present

## 2022-11-24 DIAGNOSIS — N1831 Chronic kidney disease, stage 3a: Secondary | ICD-10-CM | POA: Insufficient documentation

## 2022-11-24 DIAGNOSIS — E1165 Type 2 diabetes mellitus with hyperglycemia: Secondary | ICD-10-CM

## 2022-11-24 DIAGNOSIS — E46 Unspecified protein-calorie malnutrition: Secondary | ICD-10-CM | POA: Diagnosis not present

## 2022-11-24 DIAGNOSIS — E876 Hypokalemia: Principal | ICD-10-CM | POA: Insufficient documentation

## 2022-11-24 DIAGNOSIS — Z87891 Personal history of nicotine dependence: Secondary | ICD-10-CM | POA: Diagnosis not present

## 2022-11-24 DIAGNOSIS — Z89611 Acquired absence of right leg above knee: Secondary | ICD-10-CM | POA: Insufficient documentation

## 2022-11-24 DIAGNOSIS — E878 Other disorders of electrolyte and fluid balance, not elsewhere classified: Secondary | ICD-10-CM | POA: Diagnosis present

## 2022-11-24 DIAGNOSIS — Z8546 Personal history of malignant neoplasm of prostate: Secondary | ICD-10-CM | POA: Insufficient documentation

## 2022-11-24 DIAGNOSIS — I5042 Chronic combined systolic (congestive) and diastolic (congestive) heart failure: Secondary | ICD-10-CM | POA: Diagnosis not present

## 2022-11-24 DIAGNOSIS — E1122 Type 2 diabetes mellitus with diabetic chronic kidney disease: Secondary | ICD-10-CM | POA: Insufficient documentation

## 2022-11-24 DIAGNOSIS — Z86711 Personal history of pulmonary embolism: Secondary | ICD-10-CM | POA: Insufficient documentation

## 2022-11-24 DIAGNOSIS — E119 Type 2 diabetes mellitus without complications: Secondary | ICD-10-CM

## 2022-11-24 LAB — CBC
HCT: 40.2 % (ref 39.0–52.0)
Hemoglobin: 13 g/dL (ref 13.0–17.0)
MCH: 28.7 pg (ref 26.0–34.0)
MCHC: 32.3 g/dL (ref 30.0–36.0)
MCV: 88.7 fL (ref 80.0–100.0)
Platelets: 342 10*3/uL (ref 150–400)
RBC: 4.53 MIL/uL (ref 4.22–5.81)
RDW: 16.2 % — ABNORMAL HIGH (ref 11.5–15.5)
WBC: 8.2 10*3/uL (ref 4.0–10.5)
nRBC: 0 % (ref 0.0–0.2)

## 2022-11-24 LAB — COMPREHENSIVE METABOLIC PANEL
ALT: 23 U/L (ref 0–44)
AST: 24 U/L (ref 15–41)
Albumin: 2.3 g/dL — ABNORMAL LOW (ref 3.5–5.0)
Alkaline Phosphatase: 53 U/L (ref 38–126)
Anion gap: 9 (ref 5–15)
BUN: <5 mg/dL — ABNORMAL LOW (ref 8–23)
CO2: 25 mmol/L (ref 22–32)
Calcium: 7.7 mg/dL — ABNORMAL LOW (ref 8.9–10.3)
Chloride: 101 mmol/L (ref 98–111)
Creatinine, Ser: 0.76 mg/dL (ref 0.61–1.24)
GFR, Estimated: >60 mL/min (ref >60)
Glucose, Bld: 71 mg/dL (ref 70–99)
Potassium: 2.4 mmol/L — CL (ref 3.5–5.1)
Sodium: 135 mmol/L (ref 135–145)
Total Bilirubin: 1.3 mg/dL — ABNORMAL HIGH (ref 0.3–1.2)
Total Protein: 5.9 g/dL — ABNORMAL LOW (ref 6.5–8.1)

## 2022-11-24 LAB — URINALYSIS
Bilirubin, UA: NEGATIVE
Ketones, UA: NEGATIVE
Nitrite, UA: NEGATIVE
RBC, UA: NEGATIVE
Specific Gravity, UA: 1.019 (ref 1.005–1.030)
Urobilinogen, Ur: 0.2 mg/dL (ref 0.2–1.0)
pH, UA: >=9 — AB (ref 5.0–7.5)

## 2022-11-24 LAB — BASIC METABOLIC PANEL
BUN/Creatinine Ratio: 6 — ABNORMAL LOW (ref 10–24)
BUN: 5 mg/dL — ABNORMAL LOW (ref 8–27)
CO2: 23 mmol/L (ref 20–29)
Calcium: 8.5 mg/dL — ABNORMAL LOW (ref 8.6–10.2)
Chloride: 101 mmol/L (ref 96–106)
Creatinine, Ser: 0.81 mg/dL (ref 0.76–1.27)
Glucose: 180 mg/dL — ABNORMAL HIGH (ref 70–99)
Potassium: 2.9 mmol/L — ABNORMAL LOW (ref 3.5–5.2)
Sodium: 144 mmol/L (ref 134–144)
eGFR: 89 mL/min/{1.73_m2} (ref >59)

## 2022-11-24 MED ORDER — POTASSIUM CHLORIDE CRYS ER 20 MEQ PO TBCR: 40.0000 meq | EXTENDED_RELEASE_TABLET | Freq: Once | ORAL | Status: DC

## 2022-11-24 NOTE — ED Triage Notes (Signed)
Pt presents with no symptoms or complaints.  Sent by physician for reported abnormally low potassium level.

## 2022-11-24 NOTE — Telephone Encounter (Signed)
Called and spoke with Sister of patient and care giver, Rick Schwartz. Told her Rick Schwartz's potassium was critically low and he needed to go to the ER to have it corrected. She noted he had been feeling fine today, eating and taking his meds and using the bathroom like normal. She felt safe to transport him and that she did not need EMS. She said she would get him to the ED as soon as possible.   Told her to call clinic and leave message with on call provider if she needed anything.

## 2022-11-25 ENCOUNTER — Emergency Department (HOSPITAL_COMMUNITY): Payer: 59

## 2022-11-25 ENCOUNTER — Encounter (HOSPITAL_COMMUNITY): Payer: Self-pay | Admitting: Family Medicine

## 2022-11-25 DIAGNOSIS — E878 Other disorders of electrolyte and fluid balance, not elsewhere classified: Secondary | ICD-10-CM | POA: Diagnosis not present

## 2022-11-25 DIAGNOSIS — E876 Hypokalemia: Secondary | ICD-10-CM | POA: Diagnosis not present

## 2022-11-25 LAB — COMPREHENSIVE METABOLIC PANEL
ALT: 20 U/L (ref 0–44)
AST: 20 U/L (ref 15–41)
Albumin: 2.1 g/dL — ABNORMAL LOW (ref 3.5–5.0)
Alkaline Phosphatase: 48 U/L (ref 38–126)
Anion gap: 12 (ref 5–15)
BUN: 5 mg/dL — ABNORMAL LOW (ref 8–23)
CO2: 26 mmol/L (ref 22–32)
Calcium: 8.8 mg/dL — ABNORMAL LOW (ref 8.9–10.3)
Chloride: 103 mmol/L (ref 98–111)
Creatinine, Ser: 0.79 mg/dL (ref 0.61–1.24)
GFR, Estimated: >60 mL/min (ref >60)
Glucose, Bld: 177 mg/dL — ABNORMAL HIGH (ref 70–99)
Potassium: 3.4 mmol/L — ABNORMAL LOW (ref 3.5–5.1)
Sodium: 141 mmol/L (ref 135–145)
Total Bilirubin: 1.1 mg/dL (ref 0.3–1.2)
Total Protein: 5.4 g/dL — ABNORMAL LOW (ref 6.5–8.1)

## 2022-11-25 LAB — MAGNESIUM
Magnesium: 1.5 mg/dL — ABNORMAL LOW (ref 1.7–2.4)
Magnesium: 2.3 mg/dL (ref 1.7–2.4)

## 2022-11-25 LAB — GLUCOSE, CAPILLARY
Glucose-Capillary: 149 mg/dL — ABNORMAL HIGH (ref 70–99)
Glucose-Capillary: 165 mg/dL — ABNORMAL HIGH (ref 70–99)

## 2022-11-25 LAB — CBG MONITORING, ED
Glucose-Capillary: 161 mg/dL — ABNORMAL HIGH (ref 70–99)
Glucose-Capillary: 63 mg/dL — ABNORMAL LOW (ref 70–99)
Glucose-Capillary: 154 mg/dL — ABNORMAL HIGH (ref 70–99)

## 2022-11-25 MED ORDER — INSULIN GLARGINE-YFGN 100 UNIT/ML ~~LOC~~ SOLN
10.0000 [IU] | Freq: Every day | SUBCUTANEOUS | Status: DC
Start: 2022-11-25 — End: 2022-11-25

## 2022-11-25 MED ORDER — INSULIN ASPART 100 UNIT/ML IJ SOLN
0.0000 [IU] | Freq: Three times a day (TID) | INTRAMUSCULAR | Status: DC
  Administered 2022-11-25: 1 [IU] via SUBCUTANEOUS

## 2022-11-25 MED ORDER — CALCIUM GLUCONATE-NACL 2-0.675 GM/100ML-% IV SOLN
2.0000 g | Freq: Once | INTRAVENOUS | Status: AC
  Administered 2022-11-25: 2000 mg via INTRAVENOUS
  Filled 2022-11-25: qty 100

## 2022-11-25 MED ORDER — POTASSIUM CHLORIDE 10 MEQ/100ML IV SOLN: 10.0000 meq | INTRAVENOUS | Status: DC

## 2022-11-25 MED ORDER — POTASSIUM CHLORIDE 20 MEQ PO PACK
40.0000 meq | PACK | Freq: Once | ORAL | Status: AC
  Administered 2022-11-25: 40 meq via ORAL
  Filled 2022-11-25: qty 2

## 2022-11-25 MED ORDER — EMPAGLIFLOZIN 25 MG PO TABS
25.0000 mg | ORAL_TABLET | Freq: Every day | ORAL | Status: DC
  Administered 2022-11-25 – 2022-11-26 (×2): 25 mg via ORAL
  Filled 2022-11-25 (×2): qty 1

## 2022-11-25 MED ORDER — MAGNESIUM SULFATE 2 GM/50ML IV SOLN
2.0000 g | Freq: Once | INTRAVENOUS | Status: AC
  Administered 2022-11-25: 2 g via INTRAVENOUS
  Filled 2022-11-25: qty 50

## 2022-11-25 MED ORDER — APIXABAN 5 MG PO TABS
5.0000 mg | ORAL_TABLET | Freq: Two times a day (BID) | ORAL | Status: DC
  Administered 2022-11-25 – 2022-11-26 (×3): 5 mg via ORAL
  Filled 2022-11-25 (×3): qty 1

## 2022-11-25 MED ORDER — ROSUVASTATIN CALCIUM 20 MG PO TABS
20.0000 mg | ORAL_TABLET | Freq: Every day | ORAL | Status: DC
  Administered 2022-11-25 – 2022-11-26 (×2): 20 mg via ORAL
  Filled 2022-11-25 (×2): qty 1

## 2022-11-25 MED ORDER — POTASSIUM CHLORIDE 20 MEQ PO PACK
40.0000 meq | PACK | Freq: Two times a day (BID) | ORAL | Status: DC
  Administered 2022-11-25 – 2022-11-26 (×2): 40 meq via ORAL
  Filled 2022-11-25 (×2): qty 2

## 2022-11-25 MED ORDER — INSULIN GLARGINE-YFGN 100 UNIT/ML ~~LOC~~ SOLN
7.0000 [IU] | Freq: Every day | SUBCUTANEOUS | Status: DC
  Administered 2022-11-25 – 2022-11-26 (×2): 7 [IU] via SUBCUTANEOUS
  Filled 2022-11-25 (×2): qty 0.07

## 2022-11-25 MED ORDER — POTASSIUM CHLORIDE 10 MEQ/100ML IV SOLN
10.0000 meq | INTRAVENOUS | Status: AC
  Administered 2022-11-25 (×5): 10 meq via INTRAVENOUS
  Filled 2022-11-25 (×5): qty 100

## 2022-11-25 MED ORDER — METFORMIN HCL ER 500 MG PO TB24
500.0000 mg | ORAL_TABLET | Freq: Every day | ORAL | Status: DC
  Administered 2022-11-25 – 2022-11-26 (×2): 500 mg via ORAL
  Filled 2022-11-25 (×2): qty 1

## 2022-11-25 NOTE — ED Provider Notes (Signed)
[place] [facility]ERGENCY DEPARTMENT AT [facility] Provider Note   CSN: [id number] Arrival date & time: 11/24/22  2114     History  Chief Complaint  Patient presents with   Abnormal Labs    Rick Schwartz is a 82 y.o. male.  The history is provided by a caregiver and medical records. The history is limited by the condition of the patient (level 5 caveat dementia).  Illness Quality:  Was recently admitted for DM and had post hospital follow up called and told he had labs abnormalities to come to the ED. Severity:  Moderate Onset quality:  Gradual Duration:  1 day Progression:  Unchanged Chronicity:  New Context:  Recent discharge for diabetes Associated symptoms: no vomiting        Home Medications Prior to Admission medications   Medication Sig Start Date End Date Taking? Authorizing Provider  ELIQUIS 5 MG TABS tablet TAKE 1 TABLET(5 MG) BY MOUTH TWICE DAILY Patient taking differently: Take 5 mg by mouth 2 (two) times daily. 10/08/22  Yes Vonna Drafts, MD  empagliflozin (JARDIANCE) 25 MG TABS tablet Take 1 tablet (25 mg total) by mouth daily. 11/17/22  Yes Shelby Mattocks, DO  insulin glargine (LANTUS) 100 UNIT/ML Solostar Pen Inject 10 Units into the skin daily. Patient taking differently: Inject 20 Units into the skin daily. 11/16/22  Yes Shelby Mattocks, DO  metFORMIN (GLUCOPHAGE-XR) 500 MG 24 hr tablet Take 1 tablet (500 mg total) by mouth daily with breakfast. 11/16/22  Yes Shelby Mattocks, DO  potassium chloride (MICRO-K) 10 MEQ CR capsule TAKE 1 CAPSULE(10 MEQ) BY MOUTH DAILY Patient taking differently: Take 10 mEq by mouth daily. 06/04/22  Yes Vonna Drafts, MD  rosuvastatin (CRESTOR) 20 MG tablet TAKE 1 TABLET(20 MG) BY MOUTH DAILY Patient taking differently: Take 20 mg by mouth daily. 06/04/22  Yes Vonna Drafts, MD  Accu-Chek Softclix Lancets lancets Use as directed to check blood sugarb 3 (three) times daily. 11/16/22   Shelby Mattocks, DO  Blood Glucose Monitoring  Suppl (BLOOD GLUCOSE MONITOR SYSTEM) w/Device KIT Use 3 (three) times daily. 11/16/22   Shelby Mattocks, DO  Glucose Blood (BLOOD GLUCOSE TEST STRIPS) STRP Use 3 times daily as directed to check blood sugar. 11/16/22   Shelby Mattocks, DO  Insulin Pen Needle 32G X 4 MM MISC Use 3 (three) times daily. 11/16/22   Shelby Mattocks, DO  Lancet Device MISC 1 each by Does not apply route 3 (three) times daily. May dispense any manufacturer covered by patient's insurance. 11/16/22   Shelby Mattocks, DO      Allergies    Patient has no known allergies.    Review of Systems   Review of Systems  Unable to perform ROS: Dementia  Gastrointestinal:  Negative for vomiting.    Physical Exam Updated Vital Signs BP 138/78   Pulse (!) 108   Temp 97.8 F (36.6 C)   Resp 20   SpO2 98%  Physical Exam Vitals and nursing note reviewed. Exam conducted with a chaperone present.  Constitutional:      General: He is not in acute distress.    Appearance: He is well-developed. He is not diaphoretic.  HENT:     Head: Normocephalic.     Nose: Nose normal.  Eyes:     Conjunctiva/sclera: Conjunctivae normal.     Pupils: Pupils are equal, round, and reactive to light.  Cardiovascular:     Rate and Rhythm: Normal rate and regular rhythm.     Pulses: Normal  pulses.     Heart sounds: Normal heart sounds.  Pulmonary:     Effort: Pulmonary effort is normal.     Breath sounds: Normal breath sounds. No wheezing or rales.  Abdominal:     General: Bowel sounds are normal.     Palpations: Abdomen is soft.     Tenderness: There is no abdominal tenderness. There is no guarding or rebound.  Musculoskeletal:        General: Normal range of motion.     Cervical back: Normal range of motion and neck supple.  Skin:    General: Skin is warm and dry.     Capillary Refill: Capillary refill takes less than 2 seconds.  Neurological:     Mental Status: He is alert.     Comments: Contractures      ED Results / Procedures /  Treatments   Labs (all labs ordered are listed, but only abnormal results are displayed) Results for orders placed or performed during the hospital encounter of 11/24/22  Comprehensive metabolic panel  Result Value Ref Range   Sodium 135 135 - 145 mmol/L   Potassium 2.4 (LL) 3.5 - 5.1 mmol/L   Chloride 101 98 - 111 mmol/L   CO2 25 22 - 32 mmol/L   Glucose, Bld 71 70 - 99 mg/dL   BUN <5 (L) 8 - 23 mg/dL   Creatinine, Ser 4.78 0.61 - 1.24 mg/dL   Calcium 7.7 (L) 8.9 - 10.3 mg/dL   Total Protein 5.9 (L) 6.5 - 8.1 g/dL   Albumin 2.3 (L) 3.5 - 5.0 g/dL   AST 24 15 - 41 U/L   ALT 23 0 - 44 U/L   Alkaline Phosphatase 53 38 - 126 U/L   Total Bilirubin 1.3 (H) 0.3 - 1.2 mg/dL   GFR, Estimated >29 >56 mL/min   Anion gap 9 5 - 15  CBC  Result Value Ref Range   WBC 8.2 4.0 - 10.5 K/uL   RBC 4.53 4.22 - 5.81 MIL/uL   Hemoglobin 13.0 13.0 - 17.0 g/dL   HCT 21.3 08.6 - 57.8 %   MCV 88.7 80.0 - 100.0 fL   MCH 28.7 26.0 - 34.0 pg   MCHC 32.3 30.0 - 36.0 g/dL   RDW 46.9 (H) 62.9 - 52.8 %   Platelets 342 150 - 400 K/uL   nRBC 0.0 0.0 - 0.2 %  Magnesium  Result Value Ref Range   Magnesium 1.5 (L) 1.7 - 2.4 mg/dL  POC CBG, ED  Result Value Ref Range   Glucose-Capillary 161 (H) 70 - 99 mg/dL   CT Angio Chest Pulmonary Embolism (PE) W or WO Contrast  Result Date: 11/16/2022 CLINICAL DATA:  Pulmonary embolism (PE) suspected, high prob. Shortness of breath, tachycardia EXAM: CT ANGIOGRAPHY CHEST WITH CONTRAST TECHNIQUE: Multidetector CT imaging of the chest was performed using the standard protocol during bolus administration of intravenous contrast. Multiplanar CT image reconstructions and MIPs were obtained to evaluate the vascular anatomy. RADIATION DOSE REDUCTION: This exam was performed according to the departmental dose-optimization program which includes automated exposure control, adjustment of the mA and/or kV according to patient size and/or use of iterative reconstruction technique.  CONTRAST:  75mL OMNIPAQUE IOHEXOL 350 MG/ML SOLN COMPARISON:  09/20/2020 FINDINGS: Cardiovascular: No filling defects in the pulmonary arteries to suggest pulmonary emboli. Heart is normal size. Aorta is normal caliber. Diffuse coronary artery and aortic atherosclerosis. Mediastinum/Nodes: No mediastinal, hilar, or axillary adenopathy. Trachea and esophagus are unremarkable. Thyroid unremarkable. Lungs/Pleura: Trace  right pleural effusion. Dependent and bibasilar atelectasis. Upper Abdomen: No acute findings. Gallstones noted within the gallbladder. Musculoskeletal: No acute bony abnormality. Review of the MIP images confirms the above findings. IMPRESSION: No evidence of pulmonary embolus. Diffuse coronary artery disease. Trace right pleural effusion.  Bibasilar and dependent atelectasis. Aortic Atherosclerosis (ICD10-I70.0). Electronically Signed   By: Charlett Nose M.D.   On: 11/16/2022 23:22   DG CHEST PORT 1 VIEW  Result Date: 11/16/2022 CLINICAL DATA:  Tachypnea EXAM: PORTABLE CHEST 1 VIEW COMPARISON:  11/10/2022 FINDINGS: Single frontal view of the chest demonstrates a stable cardiac silhouette. Chronic elevation of the right hemidiaphragm. Bibasilar hypoventilatory changes. No acute airspace disease, effusion, or pneumothorax. No acute bony abnormalities. IMPRESSION: 1. Hypoventilatory changes.  No acute airspace disease. Electronically Signed   By: Sharlet Salina M.D.   On: 11/16/2022 19:05   DG Swallowing Func-Speech Pathology  Result Date: 11/14/2022 Table formatting from the original result was not included. Modified Barium Swallow Study Patient Details Name: DRISTIN DEGON MRN: [id number] Date of Birth: 01-29-1941 Today's Date: 11/14/2022 HPI/PMH: HPI: Rick Schwartz is a 82 y.o. male presenting with AMS x4 days. CXR 8/24: "1. Small right pleural effusion, similar to prior exam.  2. No acute findings."  Head CT 8/25 with no acute findings.  Pt consumes puree diet at baseline. PMHx significant for  cerebral palsy, Hx of prostate cancer, PAD, irregular heart rhythm, Hx of PE 2022 on Eliquis, HFrEF, dementia, HLD, HTN. Clinical Impression: Clinical Impression: Patient presents with a severe oral phase dysphagia and a mild-moderate pharyngeal phase dysphagia as per this modified barium swallow study. Patient was awake and alert and participated but positioning adequately in chair in radiology suite was difficulty, resulting in limited view of trachea and pyriform sinuses due to obstruction from shoulder. During oral phase, patient with very limited lingual movement, leading to majority of boluses administered (thin liquid, nectar thick liquid, puree) spilling out of mouth anteriorily or remaining in oral cavity on lingual surface, lateral sulci and base of tongue. Transit of all tested boluses was significantly delayed, with no difference observed when thin liquids given via cup, spoon or siphoned from straw. During pharyngeal phase, swallow was initiated at vallecular sinus for nectar thick and puree solids and at pyriform sinus for thin liquids. Penetration that did not clear observed with thin liquids (PAS 3) but no aspiration observed. Mild-moderate clearing to mild amount of pharyngeal residuals observed with all consistencies in vallecular sinus, pyriform sinus and posterior pharyngeal wall. SLP recommending to continue with current PO diet of Dys 1 (puree), nectar thick liquids however based on this MBS, patient appears at significant risk of dehydration and malnutrition and to a lesser extent, aspiration. Patient will benefit from continued intervention during his acute care stay and at next venue of care with focus on patient and family education regarding swallow safety. Factors that may increase risk of adverse event in presence of aspiration Rubye Oaks & Clearance Coots 2021): Factors that may increase risk of adverse event in presence of aspiration Rubye Oaks & Clearance Coots 2021): Poor general health and/or compromised  immunity; Reduced cognitive function; Limited mobility; Weak cough; Dependence for feeding and/or oral hygiene; Frail or deconditioned Recommendations/Plan: Swallowing Evaluation Recommendations Swallowing Evaluation Recommendations Recommendations: PO diet PO Diet Recommendation: Dysphagia 1 (Pureed); Mildly thick liquids (Level 2, nectar thick) Liquid Administration via: Spoon; Cup Medication Administration: Crushed with puree Supervision: Full supervision/cueing for swallowing strategies; Full assist for feeding Swallowing strategies  : Slow rate Postural changes: Position pt  fully upright for meals Oral care recommendations: Oral care BID (2x/day); Oral care before PO; Use suctioning for oral care; Staff/trained caregiver to provide oral care Recommended consults: Consider Palliative care Treatment Plan Treatment Plan Treatment recommendations: Therapy as outlined in treatment plan below Follow-up recommendations: Other (comment) (SLP at next venue of care) Functional status assessment: Patient has had a recent decline in their functional status and demonstrates the ability to make significant improvements in function in a reasonable and predictable amount of time. Treatment frequency: Min 2x/week Treatment duration: 1 week Interventions: Aspiration precaution training; Diet toleration management by SLP; Patient/family education Recommendations Recommendations for follow up therapy are one component of a multi-disciplinary discharge planning process, led by the attending physician.  Recommendations may be updated based on patient status, additional functional criteria and insurance authorization. Assessment: Orofacial Exam: Orofacial Exam Oral Cavity: Oral Hygiene: WFL Oral Cavity - Dentition: Edentulous Orofacial Anatomy: WFL Anatomy: Anatomy: Suspected cervical osteophytes Boluses Administered: Boluses Administered Boluses Administered: Thin liquids (Level 0); Mildly thick liquids (Level 2, nectar thick);  Puree  Oral Impairment Domain: Oral Impairment Domain Lip Closure: Escape beyond mid-chin Tongue control during bolus hold: Not tested Bolus transport/lingual motion: Minimal-no tongue motion Oral residue: Majority of bolus remaining Location of oral residue : Floor of mouth; Tongue; Palate Initiation of pharyngeal swallow : Pyriform sinuses; Posterior angle of the ramus  Pharyngeal Impairment Domain: Pharyngeal Impairment Domain Soft palate elevation: No bolus between soft palate (SP)/pharyngeal wall (PW) Laryngeal elevation: Partial superior movement of thyroid cartilage/partial approximation of arytenoids to epiglottic petiole Anterior hyoid excursion: Partial anterior movement Epiglottic movement: Partial inversion Laryngeal vestibule closure: Incomplete, narrow column air/contrast in laryngeal vestibule Pharyngeal stripping wave : Present - diminished Pharyngeal contraction (A/P view only): N/A Pharyngoesophageal segment opening: Complete distension and complete duration, no obstruction of flow Tongue base retraction: Narrow column of contrast or air between tongue base and PPW Pharyngeal residue: Collection of residue within or on pharyngeal structures Location of pharyngeal residue: Valleculae; Tongue base; Pharyngeal wall  Esophageal Impairment Domain: Esophageal Impairment Domain Esophageal clearance upright position: Complete clearance, esophageal coating Pill: No data recorded Penetration/Aspiration Scale Score: Penetration/Aspiration Scale Score 1.  Material does not enter airway: Mildly thick liquids (Level 2, nectar thick); Puree 3.  Material enters airway, remains ABOVE vocal cords and not ejected out: Thin liquids (Level 0) Compensatory Strategies: Compensatory Strategies Compensatory strategies: Yes Straw: Ineffective Ineffective Straw: Thin liquid (Level 0)   General Information: Caregiver present: No  Diet Prior to this Study: Dysphagia 1 (pureed); Mildly thick liquids (Level 2, nectar thick)    Temperature : Normal   Respiratory Status: WFL   Supplemental O2: None (Room air)   History of Recent Intubation: No  Behavior/Cognition: Cooperative; Pleasant mood; Alert Self-Feeding Abilities: Dependent for feeding Baseline vocal quality/speech: Hypophonia/low volume Volitional Cough: Unable to elicit Volitional Swallow: Unable to elicit Exam Limitations: Poor positioning Goal Planning: Prognosis for improved oropharyngeal function: Guarded Barriers to Reach Goals: Severity of deficits; Time post onset No data recorded Patient/Family Stated Goal: not stated Consulted and agree with results and recommendations: Pt unable/family or caregiver not available Pain: Pain Assessment Pain Assessment: Faces Faces Pain Scale: 0 End of Session: Start Time:SLP Start Time (ACUTE ONLY): 1205 Stop Time: SLP Stop Time (ACUTE ONLY): 1225 Time Calculation:SLP Time Calculation (min) (ACUTE ONLY): 20 min Charges: SLP Evaluations $ SLP Speech Visit: 1 Visit SLP Evaluations $MBS Swallow: 1 Procedure $Swallowing Treatment: 1 Procedure $Self Care/Home Management: 8-22 SLP visit diagnosis: SLP Visit Diagnosis: Dysphagia,  unspecified (R13.10) Past Medical History: Past Medical History: Diagnosis Date  Acute respiratory failure with hypoxemia (HCC)   Aspiration pneumonia (HCC) 01/20/2021  Bradycardia 02/08/2021  Cancer (HCC)   Prostate  Cerebral palsy (HCC)   Community acquired pneumonia   Cough 01/21/2012  COVID-19 11/22/2020  Hypercalcemia 07/18/2020  Hyperlipidemia   Hypernatremia 07/18/2020  Hypertension   Peripheral arterial disease (HCC)   critical limb ischemia  SBO (small bowel obstruction) (HCC)   Sepsis (HCC) 07/18/2020 Past Surgical History: Past Surgical History: Procedure Laterality Date  AMPUTATION Right 07/22/2020  Procedure: RIGHT ABOVE KNEE AMPUTATION;  Surgeon: Nadara Mustard, MD;  Location[facility];  Service: Orthopedics;  Laterality: Right;  PROSTATE SURGERY   Angela Nevin, MA, CCC-SLP Speech Therapy   CT HEAD WO  CONTRAST  Result Date: 11/11/2022 CLINICAL DATA:  Altered mental status EXAM: CT HEAD WITHOUT CONTRAST TECHNIQUE: Contiguous axial images were obtained from the base of the skull through the vertex without intravenous contrast. RADIATION DOSE REDUCTION: This exam was performed according to the departmental dose-optimization program which includes automated exposure control, adjustment of the mA and/or kV according to patient size and/or use of iterative reconstruction technique. COMPARISON:  07/08/2020 FINDINGS: Brain: Diffuse cerebral atrophy. 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 No acute infarct, hemorrhage or hydrocephalus. Vascular: No hyperdense vessel or unexpected calcification. Skull: No acute calvarial abnormality. 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 Atrophy. No acute intracranial abnormality. Electronically Signed   By: Charlett Nose M.D.   On: 11/11/2022 00:06   DG Chest Port 1 View  Result Date: 11/10/2022 CLINICAL DATA:  Altered level of consciousness. EXAM: PORTABLE CHEST 1 VIEW COMPARISON:  Chest radiograph 06/13/2022 FINDINGS: Patient's hand obscures assessment of the right lung base. Mild chronic elevation of right hemidiaphragm. Stable heart size and mediastinal contours. No evidence of focal airspace disease. Small right pleural effusion is similar. Scattered clips project over the right and left left hemithorax as before. No pulmonary edema. IMPRESSION: 1. Small right pleural effusion, similar to prior exam. 2. No acute findings. Electronically Signed   By: Narda Rutherford M.D.   On: 11/10/2022 23:33    EKG EKG Interpretation Date/Time:  Sunday November 25 2022 03:05:32 EDT Ventricular Rate:  106 PR Interval:    QRS Duration:  81 QT Interval:  371 QTC Calculation: 479 R  Axis:   62  Text Interpretation: Normal sinus rhythm Paired ventricular premature complexes Low voltage, extremity and precordial leads Nonspecific repol abnormality, diffuse leads Baseline wander in lead(s) V5 Confirmed by Nicanor Alcon, Braelyn Bordonaro ([id number]) on 11/25/2022 3:08:54 AM  Radiology No results found.  Procedures .Critical Care E&M  Performed by: Cy Blamer, MD Critical care provider statement:    Critical care end time:  11/25/2022 5:48 AM   Critical care was necessary to treat or prevent imminent or life-threatening deterioration of the following conditions:  Endocrine crisis   Critical care was time spent personally by me on the following activities:  Ordering and review of laboratory studies, ordering and review of radiographic studies, review of old charts and ordering and performing treatments and interventions   Care discussed with: admitting provider   After initial E/M assessment, critical care services were subsequently performed that were exclusive of separately billable procedures or treatment.   Comments:     Multiple electrolyte abnormalities with IV potassium magnesium and calcium given, patient had early U  wave on EKG     Medications Ordered in ED Medications  magnesium sulfate IVPB 2 g 50 mL (has no administration in time range)  potassium chloride 10 mEq in 100 mL IVPB (10 mEq Intravenous New Bag/Given 11/25/22 0508)  rosuvastatin (CRESTOR) tablet 20 mg (has no administration in time range)  empagliflozin (JARDIANCE) tablet 25 mg (has no administration in time range)  metFORMIN (GLUCOPHAGE-XR) 24 hr tablet 500 mg (has no administration in time range)  apixaban (ELIQUIS) tablet 5 mg (has no administration in time range)  insulin aspart (novoLOG) injection 0-6 Units (has no administration in time range)  insulin glargine-yfgn (SEMGLEE) injection 7 Units (has no administration in time range)  calcium gluconate 2 g/ 100 mL sodium chloride IVPB (2,000 mg Intravenous New  Bag/Given 11/25/22 0434)    ED Course/ Medical Decision Making/ A&P                                 Medical Decision Making Patient with multiple electrolyte abnormalities   Problems Addressed: Hypocalcemia: acute illness or injury    Details: IV calcium gluconate  Hypokalemia: acute illness or injury    Details: IV potassium  Hypomagnesemia:    Details: IV magnesium   Amount and/or Complexity of Data Reviewed Independent Historian: caregiver    Details: See above  External Data Reviewed: labs and notes.    Details: Previous notes and labs reviewed  Labs: ordered.    Details: Normal sodium 135, critically low potassium 2.4, normal creatinine.  Low magnesium 1.5, low calcium 7.7. normal white count 8.2, normal hemoglobin 13, normal platelets  Radiology: ordered. ECG/medicine tests: ordered and independent interpretation performed. Decision-making details documented in ED Course. Discussion of management or test interpretation with external provider(s): Family practice resident to see and admit the patient   Risk Prescription drug management. Decision regarding hospitalization.  Critical Care Total time providing critical care: 30 minutes    Final Clinical Impression(s) / ED Diagnoses Final diagnoses:  Hypokalemia  Hypomagnesemia  Hypocalcemia   The patient appears reasonably stabilized for admission considering the current resources, flow, and capabilities available in the ED at this time, and I doubt any other [facility] requiring further screening and/or treatment in the ED prior to admission.  Rx / DC Orders ED Discharge Orders     None         Anjolaoluwa Siguenza, MD 11/25/22 [phone]

## 2022-11-25 NOTE — ED Notes (Signed)
ED TO INPATIENT HANDOFF REPORT  ED Nurse Name and Phone #: Osvaldo Shipper RN [phone]  S Name/Age/Gender Rick Schwartz 82 y.o. male Room/Bed: 028C/028C  Code Status   Code Status: Limited: Do not attempt resuscitation (DNR) -DNR-LIMITED -Do Not Intubate/DNI   Home/SNF/Other Home Patient oriented to: self Is this baseline? Yes   Triage Complete: Triage complete  Chief Complaint Hypokalemia [E87.6]  Triage Note Pt presents with no symptoms or complaints.  Sent by physician for reported abnormally low potassium level.     Allergies No Known Allergies  Level of Care/Admitting Diagnosis ED Disposition     ED Disposition  Admit   Condition  --   Comment  Hospital Area: [facility] [[id number]]  Level of Care: Telemetry Medical [104]  May place patient in observation at [facility] or [facility] if equivalent level of care is available:: No  Covid Evaluation: Asymptomatic - no recent exposure (last 10 days) testing not required  Diagnosis: Hypokalemia [172180]  Admitting Physician: Evette Georges [[id number]]  Attending Physician: Latrelle Dodrill [[id number]]          B Medical/Surgery History Past Medical History:  Diagnosis Date   Acute respiratory failure with hypoxemia (HCC)    Aspiration pneumonia (HCC) 01/20/2021   Bradycardia 02/08/2021   Cancer (HCC)    Prostate   Cerebral palsy (HCC)    Community acquired pneumonia    Cough 01/21/2012   COVID-19 11/22/2020   Diabetes mellitus without complication (HCC) 11/15/2022   Hypercalcemia 07/18/2020   Hyperlipidemia    Hypernatremia 07/18/2020   Hypertension    Peripheral arterial disease (HCC)    critical limb ischemia   SBO (small bowel obstruction) (HCC)    Sepsis (HCC) 07/18/2020   Past Surgical History:  Procedure Laterality Date   AMPUTATION Right 07/22/2020   Procedure: RIGHT ABOVE KNEE AMPUTATION;  Surgeon: Nadara Mustard, MD;  Location: [facility] OR;  Service: Orthopedics;  Laterality: Right;    PROSTATE SURGERY       A IV Location/Drains/Wounds Patient Lines/Drains/Airways Status     Active Line/Drains/Airways     Name Placement date Placement time Site Days   Peripheral IV 11/25/22 20 G 1" Anterior;Left;Proximal Forearm 11/25/22  0354  Forearm  less than 1   Peripheral IV 11/25/22 20 G Left;Upper Arm 11/25/22  0431  Arm  less than 1   Pressure Injury 09/21/20 Heel Left Stage 3 -  Full thickness tissue loss. Subcutaneous fat may be visible but bone, tendon or muscle are NOT exposed. Red, pale with scar tissue 09/21/20  1000  -- 795   Pressure Injury 01/23/21 Scrotum Lower Stage 2 -  Partial thickness loss of dermis presenting as a shallow open injury with a red, pink wound bed without slough. Two areas of open skin due to moisture 01/23/21  0500  -- 671   Pressure Injury 11/16/22 Buttocks Left;Right Stage 2 -  Partial thickness loss of dermis presenting as a shallow open injury with a red, pink wound bed without slough. open skin 11/16/22  0800  -- 9            Intake/Output Last 24 hours  Intake/Output Summary (Last 24 hours) at 11/25/2022 1324 Last data filed at 11/25/2022 1117 Gross per 24 hour  Intake 450.88 ml  Output --  Net 450.88 ml    Labs/Imaging Results for orders placed or performed during the hospital encounter of 11/24/22 (from the past 48 hour(s))  Comprehensive metabolic panel  Status: Abnormal   Collection Time: 11/24/22  9:44 PM  Result Value Ref Range   Sodium 135 135 - 145 mmol/L   Potassium 2.4 (LL) 3.5 - 5.1 mmol/L    Comment: CRITICAL RESULT CALLED TO, READ BACK BY AND VERIFIED WITH [facility] RN 11/24/22 2237 AMIREHSANI F   Chloride 101 98 - 111 mmol/L   CO2 25 22 - 32 mmol/L   Glucose, Bld 71 70 - 99 mg/dL    Comment: Glucose reference range applies only to samples taken after fasting for at least 8 hours.   BUN <5 (L) 8 - 23 mg/dL   Creatinine, Ser 1.61 0.61 - 1.24 mg/dL   Calcium 7.7 (L) 8.9 - 10.3 mg/dL   Total Protein 5.9 (L) 6.5 -  8.1 g/dL   Albumin 2.3 (L) 3.5 - 5.0 g/dL   AST 24 15 - 41 U/L   ALT 23 0 - 44 U/L   Alkaline Phosphatase 53 38 - 126 U/L   Total Bilirubin 1.3 (H) 0.3 - 1.2 mg/dL   GFR, Estimated >09 >60 mL/min    Comment: (NOTE) Calculated using the CKD-EPI Creatinine Equation (2021)    Anion gap 9 5 - 15    Comment: Performed at [facility], [address], [place], [place] [address]  CBC     Status: Abnormal   Collection Time: 11/24/22  9:44 PM  Result Value Ref Range   WBC 8.2 4.0 - 10.5 K/uL   RBC 4.53 4.22 - 5.81 MIL/uL   Hemoglobin 13.0 13.0 - 17.0 g/dL   HCT 81.1 91.4 - 78.2 %   MCV 88.7 80.0 - 100.0 fL   MCH 28.7 26.0 - 34.0 pg   MCHC 32.3 30.0 - 36.0 g/dL   RDW 95.6 (H) 21.3 - 08.6 %   Platelets 342 150 - 400 K/uL   nRBC 0.0 0.0 - 0.2 %    Comment: Performed at [facility], [address], [place], [place] [address]  Magnesium     Status: Abnormal   Collection Time: 11/24/22  9:44 PM  Result Value Ref Range   Magnesium 1.5 (L) 1.7 - 2.4 mg/dL    Comment: Performed at [facility], [address], [place], [place] [address]  POC CBG, ED     Status: Abnormal   Collection Time: 11/25/22  3:09 AM  Result Value Ref Range   Glucose-Capillary 161 (H) 70 - 99 mg/dL    Comment: Glucose reference range applies only to samples taken after fasting for at least 8 hours.  CBG monitoring, ED     Status: Abnormal   Collection Time: 11/25/22  7:55 AM  Result Value Ref Range   Glucose-Capillary 63 (L) 70 - 99 mg/dL    Comment: Glucose reference range applies only to samples taken after fasting for at least 8 hours.  Magnesium     Status: None   Collection Time: 11/25/22  9:05 AM  Result Value Ref Range   Magnesium  1.7 - 2.4 mg/dL    SPECIMEN HEMOLYZED. HEMOLYSIS MAY AFFECT INTEGRITY OF RESULTS.    Comment: Mena Pauls Milli Woolridge AT [phone] BY D LONG  REQUEST CREDITED Performed at [facility], [address], [place], [place] [address]   Comprehensive metabolic  panel     Status: Abnormal   Collection Time: 11/25/22 12:13 PM  Result Value Ref Range   Sodium 141 135 - 145 mmol/L   Potassium 3.4 (L) 3.5 - 5.1  mmol/L   Chloride 103 98 - 111 mmol/L   CO2 26 22 - 32 mmol/L   Glucose, Bld 177 (H) 70 - 99 mg/dL    Comment: Glucose reference range applies only to samples taken after fasting for at least 8 hours.   BUN 5 (L) 8 - 23 mg/dL   Creatinine, Ser 1.32 0.61 - 1.24 mg/dL   Calcium 8.8 (L) 8.9 - 10.3 mg/dL   Total Protein 5.4 (L) 6.5 - 8.1 g/dL   Albumin 2.1 (L) 3.5 - 5.0 g/dL   AST 20 15 - 41 U/L   ALT 20 0 - 44 U/L   Alkaline Phosphatase 48 38 - 126 U/L   Total Bilirubin 1.1 0.3 - 1.2 mg/dL   GFR, Estimated >44 >01 mL/min    Comment: (NOTE) Calculated using the CKD-EPI Creatinine Equation (2021)    Anion gap 12 5 - 15    Comment: Performed at [facility], [address] [place], [place] [address]  Magnesium     Status: None   Collection Time: 11/25/22 12:13 PM  Result Value Ref Range   Magnesium 2.3 1.7 - 2.4 mg/dL    Comment: Performed at [facility], [address], [place], [place] [address]  CBG monitoring, ED     Status: Abnormal   Collection Time: 11/25/22  1:17 PM  Result Value Ref Range   Glucose-Capillary 154 (H) 70 - 99 mg/dL    Comment: Glucose reference range applies only to samples taken after fasting for at least 8 hours.   DG Chest Portable 1 View  Result Date: 11/25/2022 CLINICAL DATA:  82 year old male with history of altered mental status. EXAM: PORTABLE CHEST 1 VIEW COMPARISON:  Chest x-ray 11/16/2022. FINDINGS: Low lung volumes. Elevation of the right hemidiaphragm. These factors limits today's assessment. With these limitations in mind, no definite acute consolidative airspace disease. Bibasilar opacities presumably atelectatic. Trace right pleural effusion. No pneumothorax. No evidence of pulmonary edema. Heart size is upper limits of normal. Mediastinal contours are distorted by patient's rotation.  Atherosclerotic calcifications are noted in the thoracic aorta. IMPRESSION: 1. Low lung volumes with bibasilar subsegmental atelectasis and trace right pleural effusion. 2. Aortic atherosclerosis. Electronically Signed   By: Trudie Reed M.D.   On: 11/25/2022 07:38    Pending Labs Unresulted Labs (From admission, onward)    None       Vitals/Pain Today's Vitals   11/25/22 0900 11/25/22 1100 11/25/22 1200 11/25/22 1300  BP: 120/67 (!) 120/58 126/60 115/69  Pulse: 80   91  Resp: (!) 34 (!) 31 20 (!) 26  Temp:  98.1 F (36.7 C)    TempSrc:      SpO2: 99% 99% 99% 99%  PainSc:        Isolation Precautions No active isolations  Medications Medications  rosuvastatin (CRESTOR) tablet 20 mg (20 mg Oral Given 11/25/22 0900)  empagliflozin (JARDIANCE) tablet 25 mg (25 mg Oral Given 11/25/22 0946)  metFORMIN (GLUCOPHAGE-XR) 24 hr tablet 500 mg (500 mg Oral Given 11/25/22 0946)  apixaban (ELIQUIS) tablet 5 mg (5 mg Oral Given 11/25/22 0900)  insulin aspart (novoLOG) injection 0-6 Units (1 Units Subcutaneous Given 11/25/22 1321)  insulin glargine-yfgn (SEMGLEE) injection 7 Units (7 Units Subcutaneous Given 11/25/22 0946)  magnesium sulfate IVPB 2 g 50 mL (0 g Intravenous Stopped 11/25/22 0726)  calcium gluconate 2 g/ 100 mL sodium chloride IVPB (0 mg Intravenous Stopped 11/25/22 0621)  potassium chloride 10 mEq in 100 mL  IVPB (0 mEq Intravenous Stopped 11/25/22 1117)  potassium chloride (KLOR-CON) packet 40 mEq (40 mEq Oral Given 11/25/22 0752)    Mobility non-ambulatory     Focused Assessments Cardiac Assessment Handoff:    Lab Results  Component Value Date   CKTOTAL 70 07/21/2020   Lab Results  Component Value Date   DDIMER 1.68 (H) 09/20/2020   Does the Patient currently have chest pain? No    R Recommendations: See Admitting Provider Note  Report given to:   Additional Notes:

## 2022-11-25 NOTE — Assessment & Plan Note (Deleted)
K significantly low at 2.4, magnesium 1.5.  Calcium was 7.7 on labs but corrected to 9.1. Chronically has had low potassium.  Possibly exacerbated in the setting of new onset diabetes with insulin initiation.  Also has poor p.o. intake due to severe dysphagia -Admit to observation, med telemetry, attending Dr. Pollie Meyer -Replete electrolytes as needed -CMP and Mag recheck at 12 -Consider nephrology consultation if not improving

## 2022-11-25 NOTE — Plan of Care (Signed)

## 2022-11-25 NOTE — ED Notes (Signed)
NAD noted, respirations are equal bilaterally and unlabored at this time. Pt resting in gurney and denies any unmet needs. Pt connected to CCM, pulseox & BP. Call light within reach. 

## 2022-11-25 NOTE — ED Notes (Signed)
NAD noted, respirations are equal bilaterally and unlabored at this time. Pt resting in gurney and denies any unmet needs. Pt connected to CCM, pulseox & BP. Call light within reach. Pt is being hand feed breakfast at this time.

## 2022-11-25 NOTE — ED Notes (Signed)
Pt well appearing upon transport to IP floor.

## 2022-11-25 NOTE — ED Notes (Signed)
This RN assumed care of patient and received off going transfer of care report from off going RN. Pt presented to the ED with hypokalemia from clinic office. Pt is resting on gurney at this time, respirations are spontaneous, even, unlabored and symmetrical bilaterally. Pt skin tone is appropriate for ethnicity, dry and warm. Pt connected to CCM, pulse ox and BP.

## 2022-11-25 NOTE — ED Notes (Signed)
Pt ate 100% of his breakfast.

## 2022-11-25 NOTE — H&P (Signed)
Hospital Admission History and Physical Service Pager: [phone]  Patient name: Rick Schwartz Medical record number: [id number] Date of Birth: 12-Aug-1940 Age: 82 y.o. Gender: male  Primary Care Provider: Vonna Drafts, MD Consultants: None Code Status: DNR Preferred Emergency Contact:   Contact Information     Name Relation Home Work Mobile   Gem Sister [phone]     Rick Schwartz, Rick Schwartz [phone]        Other Contacts     Name Relation Home Work Mobile   Rick Schwartz Sister   [phone]   Rick Schwartz, Rick Schwartz Rick Schwartz   [phone]        Chief Complaint: Low potassium  Assessment and Plan: Rick Schwartz is a 82 y.o. male PMH Type 2 diabetes (recently diagnosed with HHS and discharged from our service), CKD 3a, BPH, HFrEF, cerebral palsy, prostate cancer, history of pulmonary embolus presenting with hypokalemia and hypomagnesemia. Differential for this patient's presentation of this includes exacerbation of chronic hypokalemia due to initiation of insulin after last hospitalization, poor oral intake likely contributing as well.  Hypokalemia and Hypomagnesemia K significantly low at 2.4, magnesium 1.5.  Calcium was 7.7 on labs but corrected to 9.1. Chronically has had low potassium.  Possibly exacerbated in the setting of new onset diabetes with insulin initiation--check 10 units 3 times daily instead of 10 units daily of long-acting insulin.  Also has chronic poor p.o. intake due to severe dysphagia. -Admit to observation, med telemetry, attending Dr. Pollie Meyer -Replete electrolytes as needed -CMP and Mag recheck at 12 -Consider nephrology consultation if not improving  Diabetes mellitus without complication [facility]) Recently diagnosed diabetes on last admission 11/10/2022.  Was placed on 10 units of Semglee at discharge but actually took 10 units TID.  CBG is within normal range today. 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 male presenting with hypokalemia and hypomagnesemia.  Presented after being told by PCP to come in for evaluation for low K. Patient feels well. No fevers, vomiting, diarrhea. No chest pain. No SOB. Has overall been able to take his medications. Has been eating regular diet.  States that he is actually taking 10 units of long-acting insulin 3 times a day instead of once daily.  He has been taking 10 mill equivalents of potassium at home daily.  Denies any Lasix or diuretics.  He is not mobile at baseline. He has pressure wounds on his scrotum. He lives with his sister who helps with the majority of his care.  In the ED, K 2.9, magnesium 1.5, corrected calcium 9.1.  Was started on 5 rounds of IV potassium, 2 g of magnesium, and given 2 g of calcium gluconate.  Patient asymptomatic, FMTS paged for admission for electrolyte repletion.  Review Of Systems: Per HPI  Pertinent Past Medical History: T2DM HFrEF BPH CKD3a Cerebral palsy Remainder reviewed in history tab.  Pertinent Past Surgical History: Amputation of RLE above knee  Prostate surgery Remainder reviewed in history tab.   Pertinent Social History: Tobacco use: Former, quit 20 years ago, smoked about 5 years Alcohol use:  1-2 week, socially Other Substance use: No Lives with sister, has help in the home  Pertinent Family History: HTN in brother, father, mother, sister Remainder reviewed in history tab.   Important Outpatient Medications: Eliquis 5 mg BID Jardiance 25 mg daily Lantus 20 units daily Metformin 500 mg daily Potassium 10 mEq daily Crestor 20 mg daily Remainder reviewed in medication history.   Objective: BP 138/78   Pulse (!) 108   Temp 97.8 F (36.6 C)   Resp 20   SpO2 98%  Exam: General: NAD, awake, alert, responsive to all questions Eyes: No scleral icterus, ENTM: Dry mucous membranes, mouth agape Neck: No masses, ROM intact Cardiovascular: Regular rate, no murmurs rubs or gallops Respiratory: CTAB, no wheezes rales or crackles, saturating well on room air, speaking full sentences Gastrointestinal: Soft, nontender to palpation MSK: Right-sided AKA, left leg well-perfused Derm: Unable to visualize testicular or buttock ulcers -on our exam due to patient positioning-Dr. Mabe present during examination Neuro: Alert and responsive, very contractured on exam Psych: Mood appropriate, pleasant  Labs:  CBC BMET  Recent Labs  Lab 11/24/22 2144  WBC 8.2  HGB 13.0  HCT 40.2  PLT 342   Recent Labs  Lab 11/24/22 2144  NA 135  K 2.4*  CL 101  CO2 25  BUN <5*  CREATININE 0.76  GLUCOSE 71  CALCIUM 7.7*     K 2.4, creatinine 0.56, T. bili 1.3 Mag 1.5   EKG: Wandering baseline, PVCs, difficult to tell but no obvious U waves on my interpretation   Imaging Studies Performed: CXR 1 view-my preliminary read appears clear, no consolidations, small lung volumes   Levin Erp, MD 11/25/2022, 6:23 AM PGY-3, [facility]  FPTS Intern pager: [phone], text pages welcome Secure chat group [employer] [facility] Teaching Service

## 2022-11-25 NOTE — Assessment & Plan Note (Addendum)
Recently diagnosed diabetes on last admission 11/10/2022.  Was placed on 10 units of Semglee at discharge but actually took 10 units TID.  CBG is within normal range today. - Semglee 7 units - vsSSI - CBG checks 4 times daily - Metformin 500 daily - Jardiance 25 mg daily

## 2022-11-25 NOTE — Hospital Course (Signed)
U wave  Calcium gluconate Mag low

## 2022-11-25 NOTE — ED Notes (Addendum)
Pt ate a 100%  of his lunch

## 2022-11-25 NOTE — ED Notes (Signed)
Pt cleaned and changed into clean gown, brief and bed sheets.

## 2022-11-26 DIAGNOSIS — E876 Hypokalemia: Secondary | ICD-10-CM | POA: Diagnosis not present

## 2022-11-26 DIAGNOSIS — E878 Other disorders of electrolyte and fluid balance, not elsewhere classified: Secondary | ICD-10-CM | POA: Diagnosis not present

## 2022-11-26 LAB — BASIC METABOLIC PANEL
Anion gap: 8 (ref 5–15)
BUN: 9 mg/dL (ref 8–23)
CO2: 25 mmol/L (ref 22–32)
Calcium: 8.5 mg/dL — ABNORMAL LOW (ref 8.9–10.3)
Chloride: 108 mmol/L (ref 98–111)
Creatinine, Ser: 0.76 mg/dL (ref 0.61–1.24)
GFR, Estimated: >60 mL/min (ref >60)
Glucose, Bld: 106 mg/dL — ABNORMAL HIGH (ref 70–99)
Potassium: 4.3 mmol/L (ref 3.5–5.1)
Sodium: 141 mmol/L (ref 135–145)

## 2022-11-26 LAB — GLUCOSE, CAPILLARY
Glucose-Capillary: 79 mg/dL (ref 70–99)
Glucose-Capillary: 115 mg/dL — ABNORMAL HIGH (ref 70–99)

## 2022-11-26 LAB — MAGNESIUM: Magnesium: 1.9 mg/dL (ref 1.7–2.4)

## 2022-11-26 MED ORDER — INSULIN GLARGINE 100 UNIT/ML SOLOSTAR PEN: 7.0000 [IU] | PEN_INJECTOR | Freq: Every day | SUBCUTANEOUS | 0 refills | Status: DC

## 2022-11-26 MED ORDER — METFORMIN HCL ER 500 MG PO TB24: 500.0000 mg | ORAL_TABLET | Freq: Every day | ORAL | Status: DC

## 2022-11-26 NOTE — Progress Notes (Addendum)
     Daily Progress Note Intern Pager: [phone]  Patient name: Rick Schwartz Medical record number: [id number] Date of birth: 12-May-1940 Age: 82 y.o. Gender: male  Primary Care Provider: Vonna Drafts, MD Consultants: None Code Status: DNR  Pt Overview and Major Events to Date:  9/8: Admitted  Assessment and Plan: Rick Schwartz is a 82 y.o. male with past medical history of T2DM (recently diagnosed with HHS), CKD 3a, BPH, HFrEF, cerebral palsy, prostate cancer, history of PE presenting with electrolyte abnormalities. Assessment & Plan Hypokalemia and Hypomagnesemia K upon admission 2.4, improved to 4.3. Mag 1.5>2.3>1.9. Likely due to chronic poor PO intake due to severe dysphagia and exacerbated in the setting of the new onset diabetes with insulin initiation -- 10u TID instead of 10u/day of LAI.  - Replete electrolytes as needed Diabetes mellitus without complication (HCC) Recently diagnosed diabetes on last admission 11/10/2022.  Was placed on 10 units of Semglee at discharge but actually took 10 units TID.  CBG is within normal range today. 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 No concerns overnight.    Objective: Temp:  [98 F (36.7 C)-98.6 F (37 C)] 98.6 F (37 C) (09/09 0504) Pulse Rate:  [80-91] 83 (09/09 0504) Resp:  [14-38] 18 (09/09 0504) BP: (93-126)/(48-85) 111/58 (09/09 0504) SpO2:  [92 %-100 %] 92 % (09/09 0504) Weight:  [56.2 kg] 56.2 kg (09/08 1500) Physical Exam: General: NAD, awake and alert Cardiovascular: RRR. No M/R/G Respiratory: CTAB, normal WOB on RA. No wheezing, crackles, rhonchi, or diminished breath sounds. Abdomen: Soft, non-tender, non-distended. Bowel sounds normoactive Extremities: R sided AKA, left leg well-perfused. No edema. Skin: Warm and dry. Neuro: Alert and responsive  Laboratory: Most recent CBC Lab Results  Component Value Date   WBC 8.2 11/24/2022   HGB 13.0 11/24/2022   HCT 40.2 11/24/2022   MCV 88.7 11/24/2022   PLT 342 11/24/2022   Most recent BMP    Latest Ref Rng & Units 11/26/2022    6:10 AM  BMP  Glucose 70 - 99 mg/dL 517   BUN 8 - 23 mg/dL 9   Creatinine 6.16 - 0.73 mg/dL 7.10   Sodium 626 - 948 mmol/L 141   Potassium 3.5 - 5.1 mmol/L 4.3   Chloride 98 - 111 mmol/L 108   CO2 22 - 32 mmol/L 25   Calcium 8.9 - 10.3 mg/dL 8.5      Imaging/Diagnostic Tests: None  Fortunato Curling, DO 11/26/2022, 7:59 AM  PGY-1, [place] [facility] FPTS Intern pager: [phone], text pages welcome Secure chat group [facility] Teaching Service

## 2022-11-26 NOTE — Discharge Instructions (Addendum)
Dear Rick Schwartz,   Thank you so much for allowing [place] to be part of your care!  You were admitted to [facility] for changes in your electrolytes.   We supplemented you with what you needed in hospital.   Continue with Lantus 7U daily for insulin  POST-HOSPITAL & CARE INSTRUCTIONS Please go to scheduled PCP follow up  Please let PCP/Specialists know of any changes that were made.  Please see medications section of this packet for any medication changes.   DOCTOR'S APPOINTMENT & FOLLOW UP CARE INSTRUCTIONS  Future Appointments  Date Time Provider Department Center  11/30/2022 11:00 AM [facility] LAB [facility]  12/05/2022 10:10 AM Vonna Drafts, MD [facility]  09/27/2023  8:45 AM [facility] ANNUAL WELLNESS VISIT [facility]    RETURN PRECAUTIONS: -Worsening confusion -Shortness of breath -Chest pain  -Swelling in the legs -Really high sugars   Take care and be well!  Family Medicine Teaching Service  [place]  [facility]  [address] [place], [place] [address] [phone]

## 2022-11-26 NOTE — Assessment & Plan Note (Signed)
Recently diagnosed diabetes on last admission 11/10/2022.  Was placed on 10 units of Semglee at discharge but actually took 10 units TID.  CBG is within normal range today. - Semglee 7 units - vsSSI - CBG checks 4 times daily - Metformin 500 daily - Jardiance 25 mg daily

## 2022-11-26 NOTE — Assessment & Plan Note (Addendum)
K upon admission 2.4, improved to 4.3. Mag 1.5>2.3>1.9. Likely due to chronic poor PO intake due to severe dysphagia and exacerbated in the setting of the new onset diabetes with insulin initiation -- 10u TID instead of 10u/day of LAI.  - Replete electrolytes as needed

## 2022-11-26 NOTE — TOC Transition Note (Signed)
Transition of Care [facility]) - CM/SW Discharge Note   Patient Details  Name: Rick Schwartz MRN: [id number] Date of Birth: [birth year]  Transition of Care [facility]) CM/SW Contact:  Tom-Johnson, Hershal Coria, RN Phone Number: 11/26/2022, 2:39 PM   Clinical Narrative:     Patient is scheduled for discharge today.  Patient to resume Home Health with [facility] at discharge. Hospital f/u and discharge instructions on AVS. No TOC needs or recommendations noted. Sister, [name] to transport at discharge.  No further TOC needs noted.  Final next level of care: Home/Self Care (Home Health Resumption of care) Barriers to Discharge: Barriers Resolved   Patient Goals and CMS Choice      Discharge Placement                  Patient to be transferred to facility by: Sister Name of family member notified: [name]    Discharge Plan and Services Additional resources added to the After Visit Summary for                                       Social Determinants of Health (SDOH) Interventions SDOH Screenings   Food Insecurity: No Food Insecurity (11/25/2022)  Housing: Patient Declined (11/25/2022)  Transportation Needs: No Transportation Needs (11/25/2022)  Utilities: Not At Risk (11/25/2022)  Alcohol Screen: Low Risk  (09/24/2022)  Depression (PHQ2-9): Low Risk  (11/23/2022)  Financial Resource Strain: Low Risk  (09/24/2022)  Physical Activity: Inactive (09/24/2022)  Social Connections: Socially Isolated (09/24/2022)  Stress: No Stress Concern Present (09/24/2022)  Tobacco Use: Medium Risk (11/25/2022)     Readmission Risk Interventions     No data to display

## 2022-11-26 NOTE — Care Management Obs Status (Signed)
MEDICARE OBSERVATION STATUS NOTIFICATION   Patient Details  Name: Rick Schwartz MRN: [id number] Date of Birth: Jan 30, 1941   Medicare Observation Status Notification Given:  Yes    Tom-Johnson, Hershal Coria, RN 11/26/2022, 1:08 PM

## 2022-11-26 NOTE — Progress Notes (Signed)
DISCHARGE NOTE HOME ERICKSON KOCIAN to be discharged Home per MD order. Discussed prescriptions and follow up appointments with the patient. Prescriptions given to patient; medication list explained in detail. Patient verbalized understanding.  Skin clean, dry and intact without evidence of skin break down, no evidence of skin tears noted. IV catheter discontinued intact. Site without signs and symptoms of complications. Dressing and pressure applied. Pt denies pain at the site currently. No complaints noted.  Patient free of lines, drains, and wounds.   An After Visit Summary (AVS) was printed and given to the patient. Patient escorted via wheelchair, and discharged home via private auto.  Lorine Bears, RN

## 2022-11-26 NOTE — Discharge Summary (Addendum)
Family Medicine Teaching [facility] - Inpatient Discharge Summary  Patient name: Rick Schwartz Medical record number: [id number] Date of birth: 04-25-1940 Age: 82 y.o. Gender: male Date of Admission: 11/24/2022  Date of Discharge: 11/26/22  Admitting Physician: Rick Georges, MD  Primary Care Provider: Vonna Drafts, MD Consultants: none  Indication for Hospitalization: Hypokalemia and Hypomagnesemia  Brief Hospital Course:  Rick Schwartz is a 82 y.o.male with a history of T2DM, recently diagnosed with HHS, CKD 3a, BPH HFrEF, cerebral palsy, prostate cancer, history of PE who was admitted to the [facility] Medicine Teaching Service at [facility] for electrolyte abnormalities. His hospital course is detailed below:  Hypokalemia and Hypomagnesemia Patient presented to the ED and was found to have hypokalemia and hypomagnesemia likely secondary to poor PO intake d/t chronic dysphagia and in the setting of new onset diabetes with insulin titration, supposedly taking 10 units LAI three times a day. Replete electrolytes as needed.   T2DM Recently diagnosed at her last admission on 11/10/22. Was placed on 10 units of Semglee at discharge, but was actually taking 10 units TID. Restarted on 7 units Semglee daily, Metformin 500 mg daily, and Jardiance 25 mg daily. CBGs stable on this regimen.   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 Titrate insulin as necessary per sugars. Started back on 7U LAI daily.  Reassess healing wounds   Discharge Diagnoses/Problem List:  Principal Problem:   Hypokalemia and Hypomagnesemia Active Problems:   Diabetes mellitus without complication (HCC)  Disposition: home  Discharge Condition: stable  Discharge Exam:  General: NAD, awake and alert Cardiovascular: RRR. No M/R/G Respiratory: CTAB, normal WOB on RA. No wheezing, crackles, rhonchi, or diminished breath sounds. Abdomen: Soft, non-tender, non-distended. Bowel sounds normoactive Extremities: R sided AKA, left leg well-perfused. No edema. Skin: Warm and dry. Neuro: Alert and responsive  Significant Procedures: none  Significant Labs and Imaging:  Recent Labs  Lab 11/24/22 2144  WBC 8.2  HGB 13.0  HCT 40.2  PLT 342   Recent Labs  Lab 11/24/22 2144 11/25/22 0905 11/25/22 1213 11/26/22 0610  NA 135  --  141 141  K 2.4*  --  3.4* 4.3  CL 101  --  103 108  CO2 25  --  26 25  GLUCOSE 71  --  177* 106*  BUN <5*  --  5* 9  CREATININE 0.76  --  0.79 0.76  CALCIUM 7.7*  --  8.8* 8.5*  MG 1.5* SPECIMEN HEMOLYZED. HEMOLYSIS MAY AFFECT INTEGRITY OF RESULTS. 2.3 1.9  ALKPHOS 53  --  48  --   AST 24  --  20  --   ALT 23  --  20  --   ALBUMIN 2.3*  --  2.1*  --    Results/Tests Pending at Time of Discharge: none  Discharge Medications:  Allergies as of 11/26/2022   No Known Allergies      Medication List  STOP taking these medications    furosemide 20 MG tablet Commonly known as: LASIX       TAKE these medications    Eliquis 5 MG Tabs tablet Generic drug: apixaban TAKE 1 TABLET(5 MG) BY MOUTH TWICE DAILY   insulin glargine 100 UNIT/ML Solostar Pen Commonly known as: LANTUS Inject 7 Units into the skin daily. What changed:  how much to take Another medication with the same name was removed. Continue  taking this medication, and follow the directions you see here.   Jardiance 25 MG Tabs tablet Generic drug: empagliflozin Take 1 tablet (25 mg total) by mouth daily.   Lancet Device Misc 1 each by Does not apply route 3 (three) times daily. May dispense any manufacturer covered by patient's insurance.   metFORMIN 500 MG 24 hr tablet Commonly known as: GLUCOPHAGE-XR Take 1 tablet (500 mg total) by mouth daily with breakfast. What changed: Another medication with the same name was removed. Continue taking this medication, and follow the directions you see here.   potassium chloride 10 MEQ CR capsule Commonly known as: MICRO-K TAKE 1 CAPSULE(10 MEQ) BY MOUTH DAILY   rosuvastatin 20 MG tablet Commonly known as: CRESTOR TAKE 1 TABLET(20 MG) BY MOUTH DAILY        Discharge Instructions: Please refer to Patient Instructions section of EMR for full details.  Patient was counseled important signs and symptoms that should prompt return to medical care, changes in medications, dietary instructions, activity restrictions, and follow up appointments.   Follow-Up Appointments:  Date Time Provider Department Center  11/30/2022 11:00 AM [facility] LAB [facility]  12/05/2022 10:10 AM Rick Drafts, MD [facility]  09/27/2023  8:45 AM [facility] ANNUAL WELLNESS VISIT [facility]   Rick Curling DO 11/26/2022, 1:42 PM PGY-1, [facility]   Upper Level Addendum:  I have seen and evaluated this patient along with Dr. Fatima Schwartz and reviewed the above note, making necessary revisions as appropriate.  I agree with the medical decision making and physical exam as noted above.  Rick Martinez, MD PGY-3 [facility] Residency

## 2022-11-27 ENCOUNTER — Telehealth: Payer: Self-pay

## 2022-11-27 NOTE — Telephone Encounter (Signed)
Autumn from [facility] calling for Speech Therapy verbal orders as follows:  1 time(s) weekly for 9 week(s). Also requesting orders for OT evaluation.   Verbal orders given per [facility] protocol  Veronda Prude, RN

## 2022-11-27 NOTE — Transitions of Care (Post Inpatient/ED Visit) (Signed)
   11/27/2022  Name: Rick Schwartz MRN: [id number] DOB: 11/12/1940  Today's TOC FU Call Status: Today's TOC FU Call Status:: Successful TOC FU Call Completed TOC FU Call Complete Date: 11/27/22 Patient's Name and Date of Birth confirmed.  Transition Care Management Follow-up Telephone Call Date of Discharge: 11/26/22 Discharge Facility: [name]) Type of Discharge: Inpatient Admission Primary Inpatient Discharge Diagnosis:: hypomagnesemia How have you been since you were released from the hospital?: Better Any questions or concerns?: No  Items Reviewed: Did you receive and understand the discharge instructions provided?: Yes Medications obtained,verified, and reconciled?: Yes (Medications Reviewed) Any new allergies since your discharge?: No Dietary orders reviewed?: Yes Do you have support at home?: Yes People in Home: sibling(s)  Medications Reviewed Today: Medications Reviewed Today     Reviewed by Karena Addison, LPN (Licensed Practical Nurse) on 11/27/22 at 1019  Med List Status: <None>   Medication Order Taking? Sig Documenting Provider Last Dose Status Informant  ELIQUIS 5 MG TABS tablet [id number] No TAKE 1 TABLET(5 MG) BY MOUTH TWICE DAILY Vonna Drafts, MD 11/24/2022 0900 Active Family Member, Pharmacy Records  empagliflozin (JARDIANCE) 25 MG TABS tablet [id number] No Take 1 tablet (25 mg total) by mouth daily. Shelby Mattocks, DO 11/24/2022 Active Family Member, Pharmacy Records  insulin glargine (LANTUS) 100 UNIT/ML Solostar Pen [id number]  Inject 7 Units into the skin daily. Lincoln Brigham, MD  Active   Lancet Device MISC [id number]  1 each by Does not apply route 3 (three) times daily. May dispense any manufacturer covered by patient's insurance. Shelby Mattocks, DO  Active Family Member, Pharmacy Records  metFORMIN (GLUCOPHAGE-XR) 500 MG 24 hr tablet [id number]  Take 1 tablet (500 mg total) by mouth daily with breakfast. Lincoln Brigham, MD  Active   potassium chloride  (MICRO-K) 10 MEQ CR capsule [id number] No TAKE 1 CAPSULE(10 MEQ) BY MOUTH DAILY Vonna Drafts, MD 11/24/2022 Active Family Member, Pharmacy Records  rosuvastatin (CRESTOR) 20 MG tablet [id number] No TAKE 1 TABLET(20 MG) BY MOUTH DAILY Vonna Drafts, MD 11/24/2022 Active Family Member, Pharmacy Records            Home Care and Equipment/Supplies: Were Home Health Services Ordered?: NA Has Agency set up a time to come to your home?: No Any new equipment or medical supplies ordered?: Yes Were you able to get the equipment/medical supplies?: No Do you have any questions related to the use of the equipment/supplies?: No  Functional Questionnaire: Do you need assistance with bathing/showering or dressing?: No Do you need assistance with meal preparation?: No Do you need assistance with eating?: No Do you have difficulty maintaining continence: No Do you need assistance with getting out of bed/getting out of a chair/moving?: No Do you have difficulty managing or taking your medications?: No  Follow up appointments reviewed: PCP Follow-up appointment confirmed?: Yes Date of PCP follow-up appointment?: 12/05/22 Follow-up Provider: [facility] Follow-up appointment confirmed?: NA Do you need transportation to your follow-up appointment?: No Do you understand care options if your condition(s) worsen?: Yes-patient verbalized understanding    SIGNATURE Karena Addison, LPN [facility] Nurse Health Advisor Direct Dial [phone]

## 2022-11-30 ENCOUNTER — Telehealth (HOSPITAL_COMMUNITY): Payer: Self-pay | Admitting: Student

## 2022-11-30 ENCOUNTER — Other Ambulatory Visit: Payer: 59

## 2022-11-30 DIAGNOSIS — Z794 Long term (current) use of insulin: Secondary | ICD-10-CM

## 2022-11-30 DIAGNOSIS — I10 Essential (primary) hypertension: Secondary | ICD-10-CM

## 2022-11-30 NOTE — Telephone Encounter (Signed)
Lab orders placed for visit - BMP and Mag for hospital follow up lab appointment.   Rick Chard, DO [facility], PGY-2 11/30/22 11:01 AM

## 2022-12-05 ENCOUNTER — Encounter: Payer: Self-pay | Admitting: Family Medicine

## 2022-12-05 ENCOUNTER — Ambulatory Visit (INDEPENDENT_AMBULATORY_CARE_PROVIDER_SITE_OTHER): Payer: 59 | Admitting: Family Medicine

## 2022-12-05 VITALS — BP 148/95 | HR 100 | Ht 67.0 in

## 2022-12-05 DIAGNOSIS — Z23 Encounter for immunization: Secondary | ICD-10-CM

## 2022-12-05 DIAGNOSIS — Z794 Long term (current) use of insulin: Secondary | ICD-10-CM

## 2022-12-05 DIAGNOSIS — E119 Type 2 diabetes mellitus without complications: Secondary | ICD-10-CM

## 2022-12-05 DIAGNOSIS — I10 Essential (primary) hypertension: Secondary | ICD-10-CM | POA: Diagnosis not present

## 2022-12-05 DIAGNOSIS — E1165 Type 2 diabetes mellitus with hyperglycemia: Secondary | ICD-10-CM

## 2022-12-05 DIAGNOSIS — E878 Other disorders of electrolyte and fluid balance, not elsewhere classified: Secondary | ICD-10-CM | POA: Diagnosis not present

## 2022-12-05 NOTE — Addendum Note (Signed)
Addended by: Aquilla Solian on: 12/05/2022 10:37 AM   Modules accepted: Orders

## 2022-12-05 NOTE — Progress Notes (Signed)
    SUBJECTIVE:   CHIEF COMPLAINT / HPI:   Hospital follow-up for hypokalemia and hypomagnesemia, admitted 9/7-9/9.  Thought to be due to poor p.o. intake due to chronic dysphagia and new onset diabetes with insulin titration.  Copied from DC summary: PCP Follow-up Recommendations: BMP on follow up to monitor electrolytes and sugars. Titrate insulin as necessary per sugars. Started back on 7U LAI daily.  Reassess healing wounds  Most recent labs on 9/9 showed normal potassium and kidney function on BMP with normal magnesium 1.9   Presents today with sister who reports no concerns since leaving the hospital   For his diabetes, currently taking 7 units long-acting insulin daily, metformin 500 mg daily, Jardiance 25 mg daily Home CBGs ranging 107-121  Wounds on testicle and buttocks have cleared per sister   PERTINENT  PMH / PSH: Diabetes, history of PE, HFrEF, cerebral palsy  OBJECTIVE:   BP (!) 129/57   Pulse 100   Ht 5\' 7"  (1.702 m)   SpO2 99%   BMI 19.41 kg/m     General: NAD, pleasant, able to participate in exam, wheelchair-bound Cardiac: RRR, no murmurs auscultated Respiratory: CTAB, normal WOB Abdomen: soft, non-tender, non-distended, normoactive bowel sounds Extremities: Chronic RUE contracture, RLE AKA  Neuro: alert, no obvious focal deficits, nonverbal  ASSESSMENT/PLAN:   Assessment & Plan Hypokalemia and Hypomagnesemia Most recent BMP immediately after discharge showed resolution of electrolyte abnormalities.  Will recheck today Diabetes mellitus without complication (HCC) CBGs within acceptable range.  Continue 7 units Lantus daily along with metformin 500 mg daily and Jardiance 25 mg daily.  Follow-up in 1 month    Vonna Drafts, MD [facility]

## 2022-12-05 NOTE — Assessment & Plan Note (Signed)
CBGs within acceptable range.  Continue 7 units Lantus daily along with metformin 500 mg daily and Jardiance 25 mg daily.  Follow-up in 1 month

## 2022-12-05 NOTE — Assessment & Plan Note (Signed)
Most recent BMP immediately after discharge showed resolution of electrolyte abnormalities.  Will recheck today

## 2022-12-05 NOTE — Addendum Note (Signed)
Addended by: Aquilla Solian on: 12/05/2022 10:35 AM   Modules accepted: Orders

## 2022-12-05 NOTE — Patient Instructions (Signed)
I will let you know if any of the lab work from today is abnormal  Please continue taking 7 units of Lantus daily and checking blood sugars.  Please let our office know if you see fasting blood sugar levels under 100 or above 150.  Please follow-up in 1 month

## 2022-12-06 LAB — BASIC METABOLIC PANEL
BUN/Creatinine Ratio: 5 — ABNORMAL LOW (ref 10–24)
BUN: 4 mg/dL — ABNORMAL LOW (ref 8–27)
CO2: 23 mmol/L (ref 20–29)
Calcium: 9.1 mg/dL (ref 8.6–10.2)
Chloride: 102 mmol/L (ref 96–106)
Creatinine, Ser: 0.74 mg/dL — ABNORMAL LOW (ref 0.76–1.27)
Glucose: 137 mg/dL — ABNORMAL HIGH (ref 70–99)
Potassium: 3.4 mmol/L — ABNORMAL LOW (ref 3.5–5.2)
Sodium: 141 mmol/L (ref 134–144)
eGFR: 91 mL/min/{1.73_m2} (ref >59)

## 2022-12-06 LAB — MAGNESIUM: Magnesium: 1.9 mg/dL (ref 1.6–2.3)

## 2022-12-11 ENCOUNTER — Telehealth: Payer: Self-pay

## 2022-12-11 NOTE — Telephone Encounter (Signed)
Received call from [facility]- Case Manager at [facility] regarding patient.   OT assistant had visit today and reports a wound on left heel.   Requesting verbal orders for skilled nursing eval.   Verbal orders given per protocol.   Veronda Prude, RN

## 2022-12-17 ENCOUNTER — Other Ambulatory Visit: Payer: Self-pay

## 2022-12-17 MED ORDER — METFORMIN HCL ER 500 MG PO TB24: 500.0000 mg | ORAL_TABLET | Freq: Every day | ORAL | 3 refills | Status: DC

## 2022-12-17 MED ORDER — EMPAGLIFLOZIN 25 MG PO TABS: 25.0000 mg | ORAL_TABLET | Freq: Every day | ORAL | 0 refills | Status: DC

## 2022-12-20 ENCOUNTER — Telehealth: Payer: Self-pay

## 2022-12-20 DIAGNOSIS — E119 Type 2 diabetes mellitus without complications: Secondary | ICD-10-CM

## 2022-12-20 NOTE — Telephone Encounter (Signed)
Rinaldo Cloud from [place] calling for nursing verbal orders as follows:  1 time(s) weekly for 5 week(s).   Verbal orders given per [facility] protocol.   Rinaldo Cloud is also requesting CGM for patient. If appropriate, please send supplies to [employer]. Patient has poor circulation and difficulty with finger sticks due to cerebral palsy.  Veronda Prude, RN

## 2022-12-21 MED ORDER — FREESTYLE LIBRE 3 SENSOR MISC
2 refills | Status: DC
Start: 2022-12-21 — End: 2023-01-21

## 2023-01-01 ENCOUNTER — Ambulatory Visit: Payer: Self-pay | Admitting: Family Medicine

## 2023-01-04 ENCOUNTER — Ambulatory Visit: Payer: 59 | Admitting: Family Medicine

## 2023-01-11 ENCOUNTER — Other Ambulatory Visit: Payer: Self-pay | Admitting: Family Medicine

## 2023-01-13 ENCOUNTER — Other Ambulatory Visit: Payer: Self-pay | Admitting: Family Medicine

## 2023-01-21 ENCOUNTER — Ambulatory Visit: Payer: 59 | Admitting: Family Medicine

## 2023-01-21 ENCOUNTER — Encounter: Payer: Self-pay | Admitting: Student

## 2023-01-21 ENCOUNTER — Other Ambulatory Visit: Payer: Self-pay

## 2023-01-21 ENCOUNTER — Ambulatory Visit (INDEPENDENT_AMBULATORY_CARE_PROVIDER_SITE_OTHER): Payer: 59 | Admitting: Student

## 2023-01-21 VITALS — BP 136/56 | HR 81 | Ht 67.0 in

## 2023-01-21 DIAGNOSIS — Z794 Long term (current) use of insulin: Secondary | ICD-10-CM

## 2023-01-21 DIAGNOSIS — E119 Type 2 diabetes mellitus without complications: Secondary | ICD-10-CM

## 2023-01-21 DIAGNOSIS — E878 Other disorders of electrolyte and fluid balance, not elsewhere classified: Secondary | ICD-10-CM

## 2023-01-21 MED ORDER — FREESTYLE LIBRE 3 SENSOR MISC
2 refills | Status: DC
Start: 2023-01-21 — End: 2023-01-22

## 2023-01-21 MED ORDER — INSULIN GLARGINE 100 UNIT/ML SOLOSTAR PEN: 7.0000 [IU] | PEN_INJECTOR | Freq: Every day | SUBCUTANEOUS | 2 refills | Status: DC

## 2023-01-21 NOTE — Patient Instructions (Addendum)
It was great to see you! Thank you for allowing me to participate in your care!  I recommend that you always bring your medications to each appointment as this makes it easy to ensure we are on the correct medications and helps [place] not miss when refills are needed.  Our plans for today:  - Diabetes  Continue current plan   7 units of lantus daily   500 mg metformin daily   25 mg jardiance daily   Follow up in 3 months  - Low Potassium We are rechecking his labs today, to be sure he is doing well!   -BMP  We are checking some labs today, I will call you if they are abnormal will send you a MyChart message or a letter if they are normal.  If you do not hear about your labs in the next 2 weeks please let us know.  Take care and seek immediate care sooner if you develop any concerns.   Dr. Bess Kinds, MD [facility] Medicine

## 2023-01-21 NOTE — Progress Notes (Unsigned)
  SUBJECTIVE:   CHIEF COMPLAINT / HPI:   Hypokalemia -9/18 noted to have K 3.4 Still taking potasium capsules, and is eating okay.    Diabetes Meds: Lantus 7 units, metformin 500 mg daily, jardiance 25 mg daily Taking all meds. CBG checks prior to [place] running out were 108-130, mostly around 110's  PERTINENT  PMH / PSH:    OBJECTIVE:  There were no vitals taken for this visit. Physical Exam   ASSESSMENT/PLAN:   Assessment & Plan  No follow-ups on file. Bess Kinds, MD 01/21/2023, 11:26 AM PGY-3, [facility] {    This will disappear when note is signed, click to select method of visit    :1}

## 2023-01-22 ENCOUNTER — Inpatient Hospital Stay (HOSPITAL_COMMUNITY)
Admission: EM | Admit: 2023-01-22 | Discharge: 2023-01-24 | DRG: 640 | Disposition: A | Discharge status: Home / Self Care | Payer: 59 | Attending: Family Medicine | Admitting: Family Medicine

## 2023-01-22 ENCOUNTER — Emergency Department (HOSPITAL_COMMUNITY): Payer: 59

## 2023-01-22 ENCOUNTER — Encounter (HOSPITAL_COMMUNITY): Payer: Self-pay

## 2023-01-22 ENCOUNTER — Telehealth: Payer: Self-pay | Admitting: Student

## 2023-01-22 ENCOUNTER — Other Ambulatory Visit: Payer: Self-pay

## 2023-01-22 DIAGNOSIS — E876 Hypokalemia: Secondary | ICD-10-CM | POA: Diagnosis present

## 2023-01-22 DIAGNOSIS — Z87891 Personal history of nicotine dependence: Secondary | ICD-10-CM

## 2023-01-22 DIAGNOSIS — E46 Unspecified protein-calorie malnutrition: Secondary | ICD-10-CM | POA: Diagnosis present

## 2023-01-22 DIAGNOSIS — E440 Moderate protein-calorie malnutrition: Secondary | ICD-10-CM | POA: Diagnosis not present

## 2023-01-22 DIAGNOSIS — N182 Chronic kidney disease, stage 2 (mild): Secondary | ICD-10-CM

## 2023-01-22 DIAGNOSIS — K5900 Constipation, unspecified: Secondary | ICD-10-CM | POA: Insufficient documentation

## 2023-01-22 DIAGNOSIS — F0390 Unspecified dementia without behavioral disturbance: Secondary | ICD-10-CM | POA: Diagnosis present

## 2023-01-22 DIAGNOSIS — R1312 Dysphagia, oropharyngeal phase: Secondary | ICD-10-CM | POA: Diagnosis not present

## 2023-01-22 DIAGNOSIS — R1310 Dysphagia, unspecified: Secondary | ICD-10-CM

## 2023-01-22 DIAGNOSIS — Z8546 Personal history of malignant neoplasm of prostate: Secondary | ICD-10-CM | POA: Diagnosis not present

## 2023-01-22 DIAGNOSIS — Z7901 Long term (current) use of anticoagulants: Secondary | ICD-10-CM | POA: Diagnosis not present

## 2023-01-22 DIAGNOSIS — E785 Hyperlipidemia, unspecified: Secondary | ICD-10-CM | POA: Diagnosis present

## 2023-01-22 DIAGNOSIS — I2510 Atherosclerotic heart disease of native coronary artery without angina pectoris: Secondary | ICD-10-CM | POA: Diagnosis present

## 2023-01-22 DIAGNOSIS — I4891 Unspecified atrial fibrillation: Secondary | ICD-10-CM | POA: Diagnosis present

## 2023-01-22 DIAGNOSIS — Z66 Do not resuscitate: Secondary | ICD-10-CM | POA: Diagnosis present

## 2023-01-22 DIAGNOSIS — G800 Spastic quadriplegic cerebral palsy: Secondary | ICD-10-CM | POA: Diagnosis present

## 2023-01-22 DIAGNOSIS — Z89511 Acquired absence of right leg below knee: Secondary | ICD-10-CM

## 2023-01-22 DIAGNOSIS — E875 Hyperkalemia: Secondary | ICD-10-CM | POA: Diagnosis present

## 2023-01-22 DIAGNOSIS — G808 Other cerebral palsy: Secondary | ICD-10-CM | POA: Diagnosis not present

## 2023-01-22 DIAGNOSIS — E1122 Type 2 diabetes mellitus with diabetic chronic kidney disease: Secondary | ICD-10-CM

## 2023-01-22 DIAGNOSIS — K219 Gastro-esophageal reflux disease without esophagitis: Secondary | ICD-10-CM | POA: Diagnosis present

## 2023-01-22 DIAGNOSIS — Z794 Long term (current) use of insulin: Secondary | ICD-10-CM | POA: Diagnosis not present

## 2023-01-22 DIAGNOSIS — R4189 Other symptoms and signs involving cognitive functions and awareness: Secondary | ICD-10-CM | POA: Diagnosis present

## 2023-01-22 DIAGNOSIS — Z681 Body mass index (BMI) 19 or less, adult: Secondary | ICD-10-CM | POA: Diagnosis not present

## 2023-01-22 DIAGNOSIS — I129 Hypertensive chronic kidney disease with stage 1 through stage 4 chronic kidney disease, or unspecified chronic kidney disease: Secondary | ICD-10-CM | POA: Diagnosis present

## 2023-01-22 DIAGNOSIS — Z7984 Long term (current) use of oral hypoglycemic drugs: Secondary | ICD-10-CM | POA: Diagnosis not present

## 2023-01-22 DIAGNOSIS — R636 Underweight: Secondary | ICD-10-CM

## 2023-01-22 LAB — BASIC METABOLIC PANEL
Anion gap: 14 (ref 5–15)
Anion gap: 12 (ref 5–15)
BUN/Creatinine Ratio: 7 — ABNORMAL LOW (ref 10–24)
BUN: 8 mg/dL (ref 8–27)
BUN: 8 mg/dL (ref 8–23)
BUN: 8 mg/dL (ref 8–23)
CO2: 28 mmol/L (ref 20–29)
CO2: 30 mmol/L (ref 22–32)
CO2: 26 mmol/L (ref 22–32)
Calcium: 9.9 mg/dL (ref 8.6–10.2)
Calcium: 9.5 mg/dL (ref 8.9–10.3)
Calcium: 8.8 mg/dL — ABNORMAL LOW (ref 8.9–10.3)
Chloride: 97 mmol/L (ref 96–106)
Chloride: 97 mmol/L — ABNORMAL LOW (ref 98–111)
Chloride: 102 mmol/L (ref 98–111)
Creatinine, Ser: 1.08 mg/dL (ref 0.76–1.27)
Creatinine, Ser: 1.11 mg/dL (ref 0.61–1.24)
Creatinine, Ser: 1.01 mg/dL (ref 0.61–1.24)
GFR, Estimated: >60 mL/min (ref >60)
GFR, Estimated: >60 mL/min (ref >60)
Glucose, Bld: 119 mg/dL — ABNORMAL HIGH (ref 70–99)
Glucose, Bld: 98 mg/dL (ref 70–99)
Glucose: 164 mg/dL — ABNORMAL HIGH (ref 70–99)
Potassium: 2.8 mmol/L — ABNORMAL LOW (ref 3.5–5.2)
Potassium: 2.3 mmol/L — CL (ref 3.5–5.1)
Potassium: 3.1 mmol/L — ABNORMAL LOW (ref 3.5–5.1)
Sodium: 144 mmol/L (ref 134–144)
Sodium: 141 mmol/L (ref 135–145)
Sodium: 140 mmol/L (ref 135–145)
eGFR: 69 mL/min/{1.73_m2} (ref >59)

## 2023-01-22 LAB — CBC
HCT: 49.3 % (ref 39.0–52.0)
Hemoglobin: 16 g/dL (ref 13.0–17.0)
MCH: 28.7 pg (ref 26.0–34.0)
MCHC: 32.5 g/dL (ref 30.0–36.0)
MCV: 88.4 fL (ref 80.0–100.0)
Platelets: 338 10*3/uL (ref 150–400)
RBC: 5.58 MIL/uL (ref 4.22–5.81)
RDW: 14.6 % (ref 11.5–15.5)
WBC: 6.5 10*3/uL (ref 4.0–10.5)
nRBC: 0 % (ref 0.0–0.2)

## 2023-01-22 LAB — LIPASE, BLOOD: Lipase: 28 U/L (ref 11–51)

## 2023-01-22 LAB — GLUCOSE, CAPILLARY: Glucose-Capillary: 87 mg/dL (ref 70–99)

## 2023-01-22 LAB — MAGNESIUM
Magnesium: 2.3 mg/dL (ref 1.6–2.3)
Magnesium: 2 mg/dL (ref 1.7–2.4)

## 2023-01-22 LAB — HEPATIC FUNCTION PANEL
ALT: 11 U/L (ref 0–44)
AST: 19 U/L (ref 15–41)
Albumin: 2.6 g/dL — ABNORMAL LOW (ref 3.5–5.0)
Alkaline Phosphatase: 53 U/L (ref 38–126)
Bilirubin, Direct: 0.2 mg/dL (ref 0.0–0.2)
Indirect Bilirubin: 0.6 mg/dL (ref 0.3–0.9)
Total Bilirubin: 0.8 mg/dL (ref <1.2)
Total Protein: 6.3 g/dL — ABNORMAL LOW (ref 6.5–8.1)

## 2023-01-22 LAB — TROPONIN I (HIGH SENSITIVITY)
Troponin I (High Sensitivity): 43 ng/L — ABNORMAL HIGH (ref <18)
Troponin I (High Sensitivity): 42 ng/L — ABNORMAL HIGH (ref <18)

## 2023-01-22 MED ORDER — POTASSIUM CHLORIDE 10 MEQ/100ML IV SOLN
10.0000 meq | INTRAVENOUS | Status: AC
Start: 2023-01-22 — End: 2023-01-22
  Administered 2023-01-22 (×2): 10 meq via INTRAVENOUS
  Filled 2023-01-22 (×2): qty 100

## 2023-01-22 MED ORDER — POTASSIUM CHLORIDE CRYS ER 20 MEQ PO TBCR
40.0000 meq | EXTENDED_RELEASE_TABLET | Freq: Once | ORAL | Status: AC
  Administered 2023-01-22: 40 meq via ORAL
  Filled 2023-01-22: qty 2

## 2023-01-22 MED ORDER — INSULIN ASPART 100 UNIT/ML IJ SOLN
0.0000 [IU] | Freq: Three times a day (TID) | INTRAMUSCULAR | Status: DC
Start: 2023-01-23 — End: 2023-01-24
  Administered 2023-01-23: 1 [IU] via SUBCUTANEOUS

## 2023-01-22 MED ORDER — ROSUVASTATIN CALCIUM 20 MG PO TABS
20.0000 mg | ORAL_TABLET | Freq: Every day | ORAL | Status: DC
  Administered 2023-01-23 – 2023-01-24 (×2): 20 mg via ORAL
  Filled 2023-01-22 (×2): qty 1

## 2023-01-22 MED ORDER — APIXABAN 5 MG PO TABS
5.0000 mg | ORAL_TABLET | Freq: Two times a day (BID) | ORAL | Status: DC
  Administered 2023-01-22 – 2023-01-24 (×4): 5 mg via ORAL
  Filled 2023-01-22 (×4): qty 1

## 2023-01-22 MED ORDER — INSULIN ASPART 100 UNIT/ML IJ SOLN: 0.0000 [IU] | Freq: Three times a day (TID) | INTRAMUSCULAR | Status: DC

## 2023-01-22 NOTE — Plan of Care (Addendum)
FMTS Interim Progress Note  S: Patient does not endorse any pain. No chest pain or palpitations. Has not completed bedside swallow yet.   O: BP 121/65 (BP Location: Right Arm)   Pulse 90   Temp 99 F (37.2 C) (Oral)   Resp (!) 24   Ht 5\' 7"  (1.702 m)   Wt 56.2 kg   SpO2 97%   BMI 19.41 kg/m    General: No acute distress. Resting comfortably in bed. 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  CV: Normal S1/S2. No extra heart sounds. Warm and well-perfused. Pulm: Breathing comfortably on room air. No increased WOB. Abd: Soft, non-tender, non-distended. Ext: No edema of LLE. Known R AKA.  Skin:  Warm, dry. Psych: Pleasant and appropriate.   A/P: Severe hypokalemia Patient s/p K total, with improved K of now 3.1. Evening repeat EKG still with PVCs and PACs, with similar events noted on telemetry as well.  - Continue to replete K with tablet  - Continue cardiac monitoring  - Repeat AM EKG ordered - AM BMP   Dysphagia  Patient appears able to communicate clearly, with ability to protect airways.  - Spoke with nurse about performing bedside swallow test - Consider clear liquid diet pending swallow test   Continue treatment plan as otherwise indicated by day team.   Ivery Quale, MD 01/22/2023, 9:08 PM PGY-1, [facility] Family Medicine Service pager [phone]

## 2023-01-22 NOTE — H&P (Addendum)
Hospital Admission History and Physical Service Pager: [phone]  Patient name: Rick Schwartz Medical record number: [id number] Date of Birth: Mar 17, 1941 Age: 82 y.o. Gender: male  Primary Care Provider: Vonna Drafts, MD Consultants: None Code Status: DNR/DNI (aka DNR-limited), spoke with sister who is legal guardian who confirmed this. Preferred Emergency Contact: Sister Jacelyn Grip  Chief Complaint: Dysphagia  Assessment and Plan: Rick Schwartz is a 82 y.o. male presenting with dysphagia and hyperkalemia. Differential for this patient's presentation of this includes hypokalemia, CVA (low concern as patient has history of chronic dysphagia), worsening cerebral palsy, achalasia, diverticula, strictures, GERD.  Hypokalemia likely contributing to dysphagia.    Assessment & Plan Hypokalemia 2.3 on admission.  Received 40 mEq oral and 10 mEq IV x 2 in ED. Numerous PVCs on EKG and telemetry.  Mag 2.0.  Reassuringly, patient feels very well at this time. Repeat BMP 11/5 2000, replete as needed by night team Repeat BMP 11/6 0500 Repeat EKG Continue telemetry Type 2 diabetes mellitus with stage 2 chronic kidney disease, with long-term current use of insulin (HCC) Holding home metformin 500 once daily and insulin glargine 7 units daily.  Glucose 119 in ED. Glycemic control order set Very sensitive mealtime sliding scale insulin Dysphagia, unspecified type Chronic but worsening dysphagia started on Saturday.  Similar episode of dysphagia last time had hypokalemia.  Developed aspiration pneumonia last episode.  Dysphagia to solids but not liquids per sister.  Tolerated swallowing oral potassium in the ED. Bedside swallow to assess tolerance for liquids N.p.o. until bedside evaluation SLP evaluation Consider barium swallow/modified barium swallow with continued issues   Chronic and Stable Conditions: A-fib: Restarted home Eliquis   FEN/GI: N.p.o. until SLP cleared VTE  Prophylaxis: On anticoagulation Hyperlipidemia: Restarted home rosuvastatin  Disposition: Home pending improvement in dysphagia and repletion of potassium.  History of Present Illness:  Rick Schwartz is a 82 y.o. male presenting with dysphagia and be admitted for severe hypokalemia.  Patient is difficult to interview due to issues with speech which are chronic secondary to cerebral palsy.  Collateral (sister, legal guardian): Since Saturday unable to keep food down.  He has been attempting to food but vomits back up when he coughs.  Reports that a few days regurgitated not digested food.  Reports that he is tolerating fluids okay.  Last year, she reports that experienced a similar episode of trouble swallowing and it led to aspiration ammonia.  At baseline, he does not ambulate well on his own.  She reports that he has chronic constipation and was recommended to start MiraLAX by his PCP.   Reports that he takes Eliquis for A-fib.  She does not know why his potassium is low and this is a new thing.  She reports he has no issues with blood pressure.  Regarding CODE STATUS, she reports she does not want him to receive CPR and does not want him to receive intubation.  She asked if he has issues with his kidneys, and we informed her that he is not experiencing any acute kidney issues but does have some protein in his urine that needs to be follow-up by his primary care physician.  In the ED, found to have K of 2.3.  ED repleted potassium chloride 40 mEq p.o. and another 10 mEq IV x2 doses.   Review Of Systems: Per HPI with the following additions: Endorses dysphagia with solids but not with liquids.  Endorses regurgitation of undigested food but not vomiting of  digested food.  Pertinent Past Medical History: Cerebral palsy A-fib Hyperlipidemia Type 2 diabetes on insulin  Remainder reviewed in history tab.   Pertinent Past Surgical History: Right below-knee amputation Prostate  surgery  Pertinent Social History: Tobacco use: Former Alcohol use: occasionally Other Substance use: No Lives with sister  Pertinent Family History: Diabetes hypertension first-degree relatives.  Sudden death in first-degree relative  Remainder reviewed in history tab.   Important Outpatient Medications: Eliquis 5 mg twice daily Jardiance 25 mg once daily Metformin 500 mg once daily Insulin glargine 7 units once daily Potassium chloride 10 mill equivalents once daily Rosuvastatin 20 mg once daily  Objective: BP (!) 160/91   Pulse 94   Temp 98 F (36.7 C)   Resp (!) 27   Ht 5\' 7"  (1.702 m)   Wt 56.2 kg   SpO2 94%   BMI 19.41 kg/m  Exam: General: Calm and cooperative.  Laying in bed Eyes: No scleral icterus.  No obvious lesions. ENTM: No gross abnormalities. Neck: Supple Cardiovascular: Normal rate irregular rhythm.  Normal S1-S2.  No murmurs rubs or gallops. Respiratory: Lungs clear to auscultation bilaterally.  Normal effort. Gastrointestinal: Bowel sounds present.  No tenderness to palpation. MSK: No gross abnormalities. Derm: No obvious skin lesions. Neuro: Contracture of the right upper extremity.  No focal deficits otherwise.  Speech difficult to stand but baseline per patient.  Psych: Behavior appropriate to situation.  Labs:  CBC BMET  Recent Labs  Lab 01/22/23 1204  WBC 6.5  HGB 16.0  HCT 49.3  PLT 338   Recent Labs  Lab 01/22/23 1204  NA 141  K 2.3*  CL 97*  CO2 30  BUN 8  CREATININE 1.11  GLUCOSE 119*  CALCIUM 9.5     Troponins: 43, 42 Hepatic function panel: Low total protein 6.3, low albumin 2.6, otherwise unremarkable. Magnesium: 2.0 Lipase: 28   EKG: Sinus arrhythmia with premature atrial complexes and premature ventricular complexes.  No ischemic changes.   Imaging Studies Performed:  Chest x-ray: Trace right pleural effusion with persistent elevation of the right hemidiaphragm, similar to the prior exam.   Meryl Dare, MD 01/22/2023, 6:40 PM PGY-1, [facility]  FPTS Intern pager: [phone], text pages welcome Secure chat group [facility] Teaching Service   I was personally present and re-performed the exam and medical decision making and verified the service and findings are accurately documented in the intern's note.  Erick Alley, DO 01/22/2023 8:09 PM

## 2023-01-22 NOTE — Assessment & Plan Note (Addendum)
2.3 on admission.  Received 40 mEq oral and 10 mEq IV x 2 in ED. Numerous PVCs on EKG and telemetry.  Mag 2.0.  Reassuringly, patient feels very well at this time. Repeat BMP 11/5 2000, replete as needed by night team Repeat BMP 11/6 0500 Repeat EKG Continue telemetry

## 2023-01-22 NOTE — ED Notes (Signed)
Assumed care of patient, NAD noted at this time, pt resting in bed, respirations even and unlabored, skin warm and dry, bed in low position, call light within reach. Comfort measures offered. Waiting for inpatient bed assignment. SR with multiple PVC's noted on cardiac monitor with rate in the 90's. Pt denies any needs at this time.

## 2023-01-22 NOTE — ED Triage Notes (Signed)
Pt was sent by PCP for abnormal lab. K 2.8. Pt denies chest pain.

## 2023-01-22 NOTE — Assessment & Plan Note (Addendum)
Will recheck potassium and magnesium today.  Potassium noted to be slightly low at last check. Patient reports is still eating well, and taking potassium supplementation. - BMP - Mag

## 2023-01-22 NOTE — H&P (Addendum)
FMTS ATTENDING ADMISSION NOTE Rick Fredericksen,MD I  have seen and examined this patient, reviewed their chart.  I will discuss the care plan with the resident and cosign their note.  My full H&P note to follow.  82 Y/O M with PMHx of Cerebral palsy, DM2, HTN, HLD, PAD, SBO, Prostate cancer, and right AKA was brought in for low potassium level after being contacted by his PCP. He has hx of chronic hypokalemia on potassium supplement at home, which, per the record, he has been compliant with.  Hx taking was difficult due to limited communication. His sister was not by his bedside at the time of this encounter.  The record was reviewed as well. He otherwise denies any concern.  Exam: Gen: Calm in bed with no distress HEENT: Nodular mass on his right frontotemporal area (chronic) Neuro: Grossly intact Heart: S1 S2 normal, no murmurs. RRR Lungs: Air entry equal and CTA B/L Abd: Soft Ext: No edema of his left LL. Right AKA  A/P: Severe Hypokalemia:  Acute on chronic Potassium level of 2.3 - baseline potassium of 3.4 He does not appear critically ill This may be due to poor oral intake in a patient with insulin-dependent DM2. Otherwise, no documented chronic diarrhea or vomiting EKG reviewed: Occasional irregular rhythm at 107 bpm, PVCs with nonspecific t wave and st changes.  Need repeat EKG S/P 2 runs of KCl Recheck Bmet in 4 hours post-treatment and add Magnesium check to lab Consider obtaining serum aldosterone and renin tests to assess aldosterone pathology as a contributing factor to his symptoms Monitor his cardiac activities closely on telemetry  DM2: Currently NPO.  Hold home basal insulin  while NPO and place on very sensitive SSI Advanced diet as able after swallowing eval.  See residents note for his other chronic problems

## 2023-01-22 NOTE — ED Provider Notes (Signed)
[place] EMERGENCY DEPARTMENT AT [facility] Provider Note   CSN: [id number] Arrival date & time: 01/22/23  1156     History  Chief Complaint  Patient presents with   Abnormal Lab    Rick Schwartz is a 82 y.o. male with a past medical history significant for cerebral palsy, hyperlipidemia, hypertension, history of prostate cancer who presents to the ED due to hypokalemia.  Patient had labs drawn yesterday at PCP and was found to be hypokalemic.  Sister at bedside states that patient started having dysphagia 3 days ago which typically occurs when he is hypokalemic.  History of same.  Sister notes patient has had poor p.o. intake.  Patient ate breakfast this morning however, sister notes that "all came up".  Unclear whether or not patient had an episode of emesis or unable to swallow food.  Patient has been compliant with his oral potassium.  No physical complaints at bedside.  No chest pain or shortness of breath.  History obtained from patient and past medical records. No interpreter used during encounter.       Home Medications Prior to Admission medications   Medication Sig Start Date End Date Taking? Authorizing Provider  Continuous Glucose Sensor (FREESTYLE LIBRE 3 SENSOR) MISC Place 1 sensor on the skin every 14 days. Use to check glucose continuously 01/21/23   Bess Kinds, MD  ELIQUIS 5 MG TABS tablet TAKE 1 TABLET(5 MG) BY MOUTH TWICE DAILY 10/08/22   Vonna Drafts, MD  empagliflozin (JARDIANCE) 25 MG TABS tablet TAKE 1 TABLET(25 MG) BY MOUTH DAILY 01/14/23   Vonna Drafts, MD  insulin glargine (LANTUS) 100 UNIT/ML Solostar Pen Inject 7 Units into the skin daily. 01/21/23   Bess Kinds, MD  Lancet Device MISC 1 each by Does not apply route 3 (three) times daily. May dispense any manufacturer covered by patient's insurance. 11/16/22   Shelby Mattocks, DO  metFORMIN (GLUCOPHAGE-XR) 500 MG 24 hr tablet Take 1 tablet (500 mg total) by mouth daily with breakfast.  12/17/22   Vonna Drafts, MD  potassium chloride (MICRO-K) 10 MEQ CR capsule TAKE 1 CAPSULE(10 MEQ) BY MOUTH DAILY 06/04/22   Vonna Drafts, MD  rosuvastatin (CRESTOR) 20 MG tablet TAKE 1 TABLET(20 MG) BY MOUTH DAILY 06/04/22   Vonna Drafts, MD      Allergies    Patient has no known allergies.    Review of Systems   Review of Systems  Constitutional:  Negative for fever.  Respiratory:  Negative for shortness of breath.   Cardiovascular:  Negative for chest pain.  Gastrointestinal:  Negative for abdominal pain.    Physical Exam Updated Vital Signs BP 117/67   Pulse 83   Temp 98 F (36.7 C)   Resp 15   Ht 5\' 7"  (1.702 m)   Wt 56.2 kg   SpO2 98%   BMI 19.41 kg/m  Physical Exam Vitals and nursing note reviewed.  Constitutional:      General: He is not in acute distress.    Appearance: He is not ill-appearing.  HENT:     Head: Normocephalic.  Eyes:     Pupils: Pupils are equal, round, and reactive to light.  Cardiovascular:     Rate and Rhythm: Normal rate and regular rhythm.     Pulses: Normal pulses.     Heart sounds: Normal heart sounds. No murmur heard.    No friction rub. No gallop.  Pulmonary:     Effort: Pulmonary effort is normal.  Breath sounds: Normal breath sounds.  Abdominal:     General: Abdomen is flat. There is no distension.     Palpations: Abdomen is soft.     Tenderness: There is no abdominal tenderness. There is no guarding or rebound.  Musculoskeletal:        General: Normal range of motion.     Cervical back: Neck supple.  Skin:    General: Skin is warm and dry.  Neurological:     General: No focal deficit present.     Mental Status: He is alert.  Psychiatric:        Mood and Affect: Mood normal.        Behavior: Behavior normal.     ED Results / Procedures / Treatments   Labs (all labs ordered are listed, but only abnormal results are displayed) Labs Reviewed  BASIC METABOLIC PANEL - Abnormal; Notable for the following components:       Result Value   Potassium 2.3 (*)    Chloride 97 (*)    Glucose, Bld 119 (*)    All other components within normal limits  TROPONIN I (HIGH SENSITIVITY) - Abnormal; Notable for the following components:   Troponin I (High Sensitivity) 43 (*)    All other components within normal limits  TROPONIN I (HIGH SENSITIVITY) - Abnormal; Notable for the following components:   Troponin I (High Sensitivity) 42 (*)    All other components within normal limits  CBC  LIPASE, BLOOD  HEPATIC FUNCTION PANEL  MAGNESIUM    EKG EKG Interpretation Date/Time:  Tuesday January 22 2023 12:06:09 EST Ventricular Rate:  107 PR Interval:  240 QRS Duration:  64 QT Interval:  346 QTC Calculation: 461 R Axis:   24  Text Interpretation: Sinus arrhythmia Premature atrial complexes Premature ventricular complexes Low voltage QRS Non-specific ST-t changes Confirmed by Cathren Laine ([id number]) on 01/22/2023 2:51:15 PM  Radiology DG Chest 2 View  Result Date: 01/22/2023 CLINICAL DATA:  Hypokalemia. EXAM: CHEST - 2 VIEW COMPARISON:  Chest radiograph dated November 25, 2022. FINDINGS: The heart size and mediastinal contours are unchanged. Aortic atherosclerosis. Persistent elevation of the right hemidiaphragm. Similar blunting of the right costophrenic angle may represent a trace right pleural effusion. No focal consolidation. No pneumothorax. No acute osseous abnormality. IMPRESSION: Trace right pleural effusion with persistent elevation of the right hemidiaphragm, similar to the prior exam. Electronically Signed   By: Hart Robinsons M.D.   On: 01/22/2023 14:55    Procedures .Critical Care  Performed by: Mannie Stabile, PA-C Authorized by: Mannie Stabile, PA-C   Critical care provider statement:    Critical care time (minutes):  31   Critical care was necessary to treat or prevent imminent or life-threatening deterioration of the following conditions:  Metabolic crisis   Critical care was time  spent personally by me on the following activities:  Development of treatment plan with patient or surrogate, discussions with consultants, evaluation of patient's response to treatment, examination of patient, ordering and review of laboratory studies, ordering and review of radiographic studies, ordering and performing treatments and interventions, pulse oximetry, re-evaluation of patient's condition and review of old charts   I assumed direction of critical care for this patient from another provider in my specialty: no     Care discussed with: admitting provider       Medications Ordered in ED Medications  potassium chloride 10 mEq in 100 mL IVPB (10 mEq Intravenous New Bag/Given 01/22/23 1439)  potassium chloride  SA (KLOR-CON M) CR tablet 40 mEq (40 mEq Oral Given 01/22/23 1427)    ED Course/ Medical Decision Making/ A&P Clinical Course as of 01/22/23 1531  Tue Jan 22, 2023  1345 Potassium(!!): 2.3 [CA]    Clinical Course User Index [CA] Mannie Stabile, PA-C                                 Medical Decision Making Amount and/or Complexity of Data Reviewed Independent Historian: caregiver    Details: Sister at bedside provided history External Data Reviewed: notes. Labs: ordered. Decision-making details documented in ED Course. Radiology: ordered and independent interpretation performed. Decision-making details documented in ED Course. ECG/medicine tests: ordered and independent interpretation performed. Decision-making details documented in ED Course.  Risk Prescription drug management. Decision regarding hospitalization.   This patient presents to the ED for concern of dysphagia, this involves an extensive number of treatment options, and is a complaint that carries with it a high risk of complications and morbidity.  The differential diagnosis includes hypokalemia, infection, CVA, etc  82 year old male with history of cerebral palsy presents to the ED due to  hypokalemia.  History of same.  Sister at bedside states patient started having some dysphagia 3 days ago which typically occurs with his hypokalemia.  Poor p.o. intake.  No other complaints.  Patient has been compliant with his oral potassium.  Upon arrival patient afebrile, not tachycardic or hypoxic.  Patient in no acute distress.  Routine labs ordered in triage.  Added magnesium.  BMP significant for hypokalemia 2.3.  Potassium repleted.  CBC unremarkable.  No leukocytosis.  Normal hemoglobin.  Troponin elevated at 43.  Troponin appears chronically elevated.  EKG with sinus arrhythmia. Delta troponin ordered.  3:28 PM Discussed with Dr. Gilman Buttner with Family Medicine who agrees to admit patient. Magnesium pending.   Co morbidities that complicate the patient evaluation  Cerebral palsy Cardiac Monitoring: / EKG:  The patient was maintained on a cardiac monitor.  I personally viewed and interpreted the cardiac monitored which showed an underlying rhythm of: sinus arrhythmia  Social Determinants of Health:  Require caregiver  Test / Admission - Considered:  CT abdomen; however low suspicion for acute abdomen given reassuring physical exam        Final Clinical Impression(s) / ED Diagnoses Final diagnoses:  Hypokalemia    Rx / DC Orders ED Discharge Orders     None         Jesusita Oka 01/22/23 1532    Cathren Laine, MD 01/24/23 1537

## 2023-01-22 NOTE — Telephone Encounter (Signed)
Called to inform patient guardian, that patient, has potassium that is critically low.  Patient otherwise doing fine according to [facility].  Discussed taking patient emergently to emergency department for evaluation, and IV potassium repletion.  [name] agrees with plan, will take patient to [name], ED.

## 2023-01-22 NOTE — Assessment & Plan Note (Addendum)
Patient comes in for follow-up of his diabetes.  Patient's CBGs ranging 108-130, mostly around the 110s.  Sister reports good compliance with medications.  Will continue regimen as is, plan for follow-up in 3 months. - Continue Lantus 7 units daily - Continue metformin 500 mg daily - Continue Jardiance 25 mg daily -follow-up 3 months

## 2023-01-22 NOTE — Progress Notes (Signed)
Bedside swallow screen completed. Patient tolerated well with no complications.

## 2023-01-22 NOTE — ED Notes (Signed)
ED TO INPATIENT HANDOFF REPORT  ED Nurse Name and Phone #: [phone]  S Name/Age/Gender Rick Schwartz 82 y.o. male Room/Bed: 037C/037C  Code Status   Code Status: Limited: Do not attempt resuscitation (DNR) -DNR-LIMITED -Do Not Intubate/DNI   Home/SNF/Other Home, with home health care Patient oriented to: self Is this baseline? Yes   Triage Complete: Triage complete  Chief Complaint Hypokalemia [E87.6]  Triage Note Pt was sent by PCP for abnormal lab. K 2.8. Pt denies chest pain.   Allergies No Known Allergies  Level of Care/Admitting Diagnosis ED Disposition     ED Disposition  Admit   Condition  --   Comment  Hospital Area: [facility] [[id number]]  Level of Care: Telemetry Medical [104]  May admit patient to [name] or [facility] if equivalent level of care is available:: No  Covid Evaluation: Asymptomatic - no recent exposure (last 10 days) testing not required  Diagnosis: Hypokalemia [172180]  Admitting Physician: Meryl Dare [[id number]]  Attending Physician: Doreene Eland [[id number]]  Certification:: I certify this patient will need inpatient services for at least 2 midnights  Expected Medical Readiness: 01/25/2023          B Medical/Surgery History Past Medical History:  Diagnosis Date   Acute respiratory failure with hypoxemia (HCC)    Aspiration pneumonia (HCC) 01/20/2021   Bradycardia 02/08/2021   Cancer (HCC)    Prostate   Cerebral palsy (HCC)    Community acquired pneumonia    Cough 01/21/2012   COVID-19 11/22/2020   Diabetes mellitus without complication (HCC) 11/15/2022   Hypercalcemia 07/18/2020   Hyperlipidemia    Hypernatremia 07/18/2020   Hypertension    Peripheral arterial disease (HCC)    critical limb ischemia   SBO (small bowel obstruction) (HCC)    Sepsis (HCC) 07/18/2020   Past Surgical History:  Procedure Laterality Date   AMPUTATION Right 07/22/2020   Procedure: RIGHT ABOVE KNEE AMPUTATION;  Surgeon:  Nadara Mustard, MD;  Location[facility];  Service: Orthopedics;  Laterality: Right;   PROSTATE SURGERY       A IV Location/Drains/Wounds Patient Lines/Drains/Airways Status     Active Line/Drains/Airways     Name Placement date Placement time Site Days   Peripheral IV 01/22/23 20 G Anterior;Left Forearm 01/22/23  1421  Forearm  less than 1   Pressure Injury 09/21/20 Heel Left Stage 3 -  Full thickness tissue loss. Subcutaneous fat may be visible but bone, tendon or muscle are NOT exposed. Red, pale with scar tissue 09/21/20  1000  -- 853   Pressure Injury 11/16/22 Buttocks Left;Right Stage 2 -  Partial thickness loss of dermis presenting as a shallow open injury with a red, pink wound bed without slough. open skin 11/16/22  0800  -- 67   Wound / Incision (Open or Dehisced) 11/25/22 Irritant Dermatitis (Moisture Associated Skin Damage) Scrotum Anterior;Posterior;Bilateral 11/25/22  1523  Scrotum  58            Intake/Output Last 24 hours  Intake/Output Summary (Last 24 hours) at 01/22/2023 1850 Last data filed at 01/22/2023 1706 Gross per 24 hour  Intake 200 ml  Output --  Net 200 ml    Labs/Imaging Results for orders placed or performed during the hospital encounter of 01/22/23 (from the past 48 hour(s))  Basic metabolic panel     Status: Abnormal   Collection Time: 01/22/23 12:04 PM  Result Value Ref Range   Sodium 141 135 - 145 mmol/L  Potassium 2.3 (LL) 3.5 - 5.1 mmol/L    Comment: CRITICAL RESULT CALLED TO, READ BACK BY AND VERIFIED WITH A. BANKS, RN AT 1333 11.05.24 D. BLU   Chloride 97 (L) 98 - 111 mmol/L   CO2 30 22 - 32 mmol/L   Glucose, Bld 119 (H) 70 - 99 mg/dL    Comment: Glucose reference range applies only to samples taken after fasting for at least 8 hours.   BUN 8 8 - 23 mg/dL   Creatinine, Ser 2.02 0.61 - 1.24 mg/dL   Calcium 9.5 8.9 - 54.2 mg/dL   GFR, Estimated >70 >62 mL/min    Comment: (NOTE) Calculated using the CKD-EPI Creatinine Equation (2021)     Anion gap 14 5 - 15    Comment: Performed at [facility], [address], [place], [place] [address]  CBC     Status: None   Collection Time: 01/22/23 12:04 PM  Result Value Ref Range   WBC 6.5 4.0 - 10.5 K/uL   RBC 5.58 4.22 - 5.81 MIL/uL   Hemoglobin 16.0 13.0 - 17.0 g/dL   HCT 31.5 17.6 - 16.0 %   MCV 88.4 80.0 - 100.0 fL   MCH 28.7 26.0 - 34.0 pg   MCHC 32.5 30.0 - 36.0 g/dL   RDW 73.7 10.6 - 26.9 %   Platelets 338 150 - 400 K/uL   nRBC 0.0 0.0 - 0.2 %    Comment: Performed at [facility], [address] [place], [place] [address]  Troponin I (High Sensitivity)     Status: Abnormal   Collection Time: 01/22/23 12:04 PM  Result Value Ref Range   Troponin I (High Sensitivity) 43 (H) <18 ng/L    Comment: (NOTE) Elevated high sensitivity troponin I (hsTnI) values and significant  changes across serial measurements may suggest ACS but many other  chronic and acute conditions are known to elevate hsTnI results.  Refer to the "Links" section for chest pain algorithms and additional  guidance. Performed at [facility], [address], [place], [place] [address]   Troponin I (High Sensitivity)     Status: Abnormal   Collection Time: 01/22/23  2:03 PM  Result Value Ref Range   Troponin I (High Sensitivity) 42 (H) <18 ng/L    Comment: (NOTE) Elevated high sensitivity troponin I (hsTnI) values and significant  changes across serial measurements may suggest ACS but many other  chronic and acute conditions are known to elevate hsTnI results.  Refer to the "Links" section for chest pain algorithms and additional  guidance. Performed at [facility], [address] [place], [place] [address]   Hepatic function panel     Status: Abnormal   Collection Time: 01/22/23  4:29 PM  Result Value Ref Range   Total Protein 6.3 (L) 6.5 - 8.1 g/dL   Albumin 2.6 (L) 3.5 - 5.0 g/dL   AST 19 15 - 41 U/L   ALT 11 0 - 44 U/L   Alkaline Phosphatase 53 38 - 126 U/L    Total Bilirubin 0.8 <1.2 mg/dL   Bilirubin, Direct 0.2 0.0 - 0.2 mg/dL   Indirect Bilirubin 0.6 0.3 - 0.9 mg/dL    Comment: Performed at [facility], [address] [place], [place] [address]  Magnesium     Status: None   Collection Time: 01/22/23  4:29 PM  Result Value Ref Range   Magnesium 2.0 1.7 - 2.4 mg/dL  Comment: Performed at [facility], [address], [place], [place] [address]  Lipase, blood     Status: None   Collection Time: 01/22/23  4:29 PM  Result Value Ref Range   Lipase 28 11 - 51 U/L    Comment: Performed at [facility], [address] [place], [place] [address]   DG Chest 2 View  Result Date: 01/22/2023 CLINICAL DATA:  Hypokalemia. EXAM: CHEST - 2 VIEW COMPARISON:  Chest radiograph dated November 25, 2022. FINDINGS: The heart size and mediastinal contours are unchanged. Aortic atherosclerosis. Persistent elevation of the right hemidiaphragm. Similar blunting of the right costophrenic angle may represent a trace right pleural effusion. No focal consolidation. No pneumothorax. No acute osseous abnormality. IMPRESSION: Trace right pleural effusion with persistent elevation of the right hemidiaphragm, similar to the prior exam. Electronically Signed   By: Hart Robinsons M.D.   On: 01/22/2023 14:55    Pending Labs Unresulted Labs (From admission, onward)     Start     Ordered   01/23/23 0500  Basic metabolic panel  Tomorrow morning,   R        01/22/23 1738   01/22/23 2000  Basic metabolic panel  Once,   R        01/22/23 1738            Vitals/Pain Today's Vitals   01/22/23 1600 01/22/23 1722 01/22/23 1806 01/22/23 1816  BP: (!) 138/91 (!) 140/78 (!) 148/69 (!) 160/91  Pulse: 86 70 92 94  Resp: (!) 32 (!) 30 (!) 22 (!) 27  Temp:      SpO2: 100% 99% 97% 94%  Weight:      Height:      PainSc:        Isolation Precautions No active isolations  Medications Medications  rosuvastatin (CRESTOR) tablet 20 mg (has no  administration in time range)  apixaban (ELIQUIS) tablet 5 mg (has no administration in time range)  insulin aspart (novoLOG) injection 0-6 Units (has no administration in time range)  potassium chloride 10 mEq in 100 mL IVPB (0 mEq Intravenous Stopped 01/22/23 1706)  potassium chloride SA (KLOR-CON M) CR tablet 40 mEq (40 mEq Oral Given 01/22/23 1427)  potassium chloride SA (KLOR-CON M) CR tablet 40 mEq (40 mEq Oral Given 01/22/23 1805)    Mobility non-ambulatory     Focused Assessments Neuro Assessment Handoff:  Swallow screen pass? Yes          Neuro Assessment: Exceptions to WDL Neuro Checks:      Has TPA been given? No If patient is a Neuro Trauma and patient is going to OR before floor call report to 4N Charge nurse: [phone] or [phone]  Musc/Skel: R  AKA, RUE Contracted    R Recommendations: See Admitting Provider Note  Report given to:   Additional Notes: Patient hard to understand when speaking, swallows pills without difficulty.

## 2023-01-22 NOTE — Assessment & Plan Note (Addendum)
Holding home metformin 500 once daily and insulin glargine 7 units daily.  Glucose 119 in ED. Glycemic control order set Very sensitive mealtime sliding scale insulin

## 2023-01-23 ENCOUNTER — Encounter (HOSPITAL_COMMUNITY): Payer: Self-pay

## 2023-01-23 DIAGNOSIS — K5900 Constipation, unspecified: Secondary | ICD-10-CM | POA: Insufficient documentation

## 2023-01-23 DIAGNOSIS — Z681 Body mass index (BMI) 19 or less, adult: Secondary | ICD-10-CM

## 2023-01-23 DIAGNOSIS — E440 Moderate protein-calorie malnutrition: Secondary | ICD-10-CM | POA: Diagnosis not present

## 2023-01-23 DIAGNOSIS — E876 Hypokalemia: Secondary | ICD-10-CM | POA: Diagnosis not present

## 2023-01-23 DIAGNOSIS — R1312 Dysphagia, oropharyngeal phase: Secondary | ICD-10-CM

## 2023-01-23 DIAGNOSIS — G808 Other cerebral palsy: Secondary | ICD-10-CM | POA: Diagnosis present

## 2023-01-23 DIAGNOSIS — R1310 Dysphagia, unspecified: Secondary | ICD-10-CM

## 2023-01-23 HISTORY — DX: Body mass index (BMI) 19.9 or less, adult: Z68.1

## 2023-01-23 LAB — BASIC METABOLIC PANEL
Anion gap: 7 (ref 5–15)
Anion gap: 9 (ref 5–15)
BUN: 6 mg/dL — ABNORMAL LOW (ref 8–23)
BUN: 7 mg/dL — ABNORMAL LOW (ref 8–23)
CO2: 24 mmol/L (ref 22–32)
CO2: 25 mmol/L (ref 22–32)
Calcium: 8.7 mg/dL — ABNORMAL LOW (ref 8.9–10.3)
Calcium: 8.8 mg/dL — ABNORMAL LOW (ref 8.9–10.3)
Chloride: 104 mmol/L (ref 98–111)
Chloride: 102 mmol/L (ref 98–111)
Creatinine, Ser: 0.93 mg/dL (ref 0.61–1.24)
Creatinine, Ser: 0.79 mg/dL (ref 0.61–1.24)
GFR, Estimated: >60 mL/min (ref >60)
GFR, Estimated: >60 mL/min (ref >60)
Glucose, Bld: 116 mg/dL — ABNORMAL HIGH (ref 70–99)
Glucose, Bld: 88 mg/dL (ref 70–99)
Potassium: 3.4 mmol/L — ABNORMAL LOW (ref 3.5–5.1)
Potassium: 3.6 mmol/L (ref 3.5–5.1)
Sodium: 135 mmol/L (ref 135–145)
Sodium: 136 mmol/L (ref 135–145)

## 2023-01-23 LAB — MAGNESIUM: Magnesium: 2 mg/dL (ref 1.7–2.4)

## 2023-01-23 LAB — GLUCOSE, CAPILLARY
Glucose-Capillary: 112 mg/dL — ABNORMAL HIGH (ref 70–99)
Glucose-Capillary: 174 mg/dL — ABNORMAL HIGH (ref 70–99)
Glucose-Capillary: 96 mg/dL (ref 70–99)
Glucose-Capillary: 159 mg/dL — ABNORMAL HIGH (ref 70–99)

## 2023-01-23 MED ORDER — POTASSIUM CHLORIDE CRYS ER 20 MEQ PO TBCR
40.0000 meq | EXTENDED_RELEASE_TABLET | Freq: Once | ORAL | Status: AC
  Administered 2023-01-23: 40 meq via ORAL
  Filled 2023-01-23: qty 2

## 2023-01-23 MED ORDER — POLYETHYLENE GLYCOL 3350 17 G PO PACK
17.0000 g | PACK | Freq: Every day | ORAL | Status: DC
  Administered 2023-01-23 – 2023-01-24 (×2): 17 g via ORAL
  Filled 2023-01-23 (×2): qty 1

## 2023-01-23 NOTE — Assessment & Plan Note (Signed)
Reported by sister on admission and pt endorsing this today. May have been contributing to poor PO intake.  --home miralax daily

## 2023-01-23 NOTE — Discharge Instructions (Signed)
Dear Rick Schwartz,   Thank you so much for allowing [place] to be part of your care!  You were admitted to [facility] for low potassium. Please follow the diet recommendations that the speech therapist recommended   POST-HOSPITAL & CARE INSTRUCTIONS Please take potassium supplement as directed, daily. Please let PCP/Specialists know of any changes that were made.  Please see medications section of this packet for any medication changes.   DOCTOR'S APPOINTMENT & FOLLOW UP CARE INSTRUCTIONS  Future Appointments  Date Time Provider Department Center  09/27/2023  8:45 AM [facility] ANNUAL WELLNESS VISIT [facility]    RETURN PRECAUTIONS: Palpitation, vomiting, chest pain  Take care and be well!  Family Medicine Teaching Service  [place]  [facility]  [address] [place], [place] [address] [phone]

## 2023-01-23 NOTE — Assessment & Plan Note (Signed)
Chronic but worsening dysphagia started on Saturday.  Similar episode of dysphagia last time had hypokalemia.  Developed aspiration pneumonia last episode.  Dysphagia to solids but not liquids per sister.  Tolerated swallowing oral potassium in the ED. Bedside swallow to assess tolerance for liquids N.p.o. until bedside evaluation SLP evaluation Consider barium swallow/modified barium swallow with continued issues

## 2023-01-23 NOTE — Progress Notes (Signed)
Daily Progress Note Intern Pager: [phone]  Patient name: Rick Schwartz Medical record number: [id number] Date of birth: November 21, 1940 Age: 82 y.o. Gender: male  Primary Care Provider: Vonna Drafts, MD Consultants: none Code Status: DNR-limited  Pt Overview and Major Events to Date:  11/5: admitted to [facility]  Assessment and Plan: Rick Schwartz is a 82 y.o. male presenting with dysphagia and found to have hypokalemia. Differential for this patient's dysphagia of this includes hypokalemia, CVA (low concern as patient has history of chronic dysphagia), worsening cerebral palsy, achalasia, diverticula, strictures, GERD.  Hypokalemia likely contributing to dysphagia.     Pertinent PMH/PSH includes cerebral palsy, Afib on Eliquis, HLD, DM2 Assessment & Plan Hypokalemia 2.3 on admission, repleted overnight and improved to 3.4, gave another . Numerous PVCs on initial EKG and telemetry, repeat EKG with fewer PVCs. Mag 2.0.  Reassuringly, patient feels well at this time. Was discharged with daily after prior admission, this was likely insufficient dose. PM BMP Continue telemetry Dysphagia Chronic but worsening dysphagia started on Saturday.  Similar episode of dysphagia during last admission with hypokalemia.  Developed aspiration pneumonia last episode.  Dysphagia to solids but not liquids per sister.  Tolerated swallowing oral potassium in the ED and passed bedside swallow with nursing. SLP evaluation today, no signs of regurgitation or worsened esophageal/pharyngeal function, recommend DYS 1 diet Advance diet to dysphagia 1 diet with nectar thick liquids Monitor for dysphagia, regurgitation   Constipation Reported by sister on admission and pt endorsing this today. May have been contributing to poor PO intake.  --home miralax daily Type 2 diabetes mellitus with stage 2 chronic kidney disease, with long-term current use of insulin (HCC) Holding home metformin 500 once daily and  insulin glargine 7 units daily.  Glucose 119 in ED. Glycemic control order set Very sensitive mealtime sliding scale insulin   Chronic and Stable Issues: Afib: eliquis 5mg  BID HLD: crestor 20mg   FEN/GI: clear liquid pending SLP evaluation PPx: on eliquis Dispo: pending clinical improvement  Subjective:  Pt states he is feeling well this morning. Denies pain anywhere. Denies nausea, vomiting, diarrhea prior to admission. States he does feel that food gets stuck in his throat sometimes. States he also feels constipated.  Objective: Temp:  [98 F (36.7 C)-99 F (37.2 C)] 98.8 F (37.1 C) (11/06 0337) Pulse Rate:  [70-98] 76 (11/06 0337) Resp:  [15-32] 18 (11/06 0337) BP: (117-160)/(58-91) 131/89 (11/06 0337) SpO2:  [92 %-100 %] 92 % (11/06 0337) Weight:  [50.7 kg-56.2 kg] 50.7 kg (11/05 2200) Physical Exam: General: frail, chronically ill-appearing male resting comfortably in bed, difficult to understand speech, in NAD Cardiovascular: RRR, normal S1/S2 Respiratory: CTAB to anterior lung fields, normal WOB on RA Abdomen: normoactive bowel sounds, soft, nontender, mildly distended Extremities: contractures to RUE, no edema to LLE, R AKA   Laboratory: Most recent CBC Lab Results  Component Value Date   WBC 6.5 01/22/2023   HGB 16.0 01/22/2023   HCT 49.3 01/22/2023   MCV 88.4 01/22/2023   PLT 338 01/22/2023   Most recent BMP    Latest Ref Rng & Units 01/23/2023    5:04 AM  BMP  Glucose 70 - 99 mg/dL 147   BUN 8 - 23 mg/dL 6   Creatinine 8.29 - 5.62 mg/dL 1.30   Sodium 865 - 784 mmol/L 135   Potassium 3.5 - 5.1 mmol/L 3.4   Chloride 98 - 111 mmol/L 104   CO2 22 - 32 mmol/L 24  Calcium 8.9 - 10.3 mg/dL 8.7      Rick Bender, MD 01/23/2023, 7:07 AM  PGY-1, [facility] Family Medicine FPTS Intern pager: [phone], text pages welcome Secure chat group [facility] Teaching Service

## 2023-01-23 NOTE — Assessment & Plan Note (Addendum)
Chronic but worsening dysphagia started on Saturday.  Similar episode of dysphagia during last admission with hypokalemia.  Developed aspiration pneumonia last episode.  Dysphagia to solids but not liquids per sister.  Tolerated swallowing oral potassium in the ED and passed bedside swallow with nursing. SLP evaluation today, no signs of regurgitation or worsened esophageal/pharyngeal function, recommend DYS 1 diet Advance diet to dysphagia 1 diet with nectar thick liquids Monitor for dysphagia, regurgitation

## 2023-01-23 NOTE — Evaluation (Signed)
Clinical/Bedside Swallow Evaluation Patient Details  Name: GERELL FORTSON MRN: [id number] Date of Birth: June 23, 1940  Today's Date: 01/23/2023 Time: SLP Start Time (ACUTE ONLY): 0919 SLP Stop Time (ACUTE ONLY): 0933 SLP Time Calculation (min) (ACUTE ONLY): 14 min  Past Medical History:  Past Medical History:  Diagnosis Date   Acute renal failure superimposed on stage 3a chronic kidney disease (HCC) 06/13/2022   Acute respiratory failure with hypoxemia (HCC)    Aspiration pneumonia (HCC) 01/20/2021   Bradycardia 02/08/2021   Cancer (HCC)    Prostate   Cerebral palsy (HCC)    Community acquired pneumonia    Cough 01/21/2012   COVID-19 11/22/2020   Diabetes mellitus without complication (HCC) 11/15/2022   Hypercalcemia 07/18/2020   Hyperlipidemia    Hypernatremia 07/18/2020   Hypertension    Peripheral arterial disease (HCC)    critical limb ischemia   SBO (small bowel obstruction) (HCC)    Sepsis (HCC) 07/18/2020   Underweight (BMI < 18.5) 01/23/2023   Past Surgical History:  Past Surgical History:  Procedure Laterality Date   AMPUTATION Right 07/22/2020   Procedure: RIGHT ABOVE KNEE AMPUTATION;  Surgeon: Nadara Mustard, MD;  Location: [facility] OR;  Service: Orthopedics;  Laterality: Right;   PROSTATE SURGERY     HPI:  82 Y/O M admitted from home with severe hypokalemia, acute on chronic dysphagia to solids.  PTA, unable to keep food down; regurgitation of undigested foods per report. Has been followed by SLP during prior admissions, most recently August of '24. MBS 11/14/22: severe oral, mild-moderate pharyngeal dysphagia. Limited tongue mobility, delayed PO transit through oropharynx, no aspiration. There were multiple factors present that increased his risk of an adverse event in the presence of potential aspiration: Poor general health and/or compromised immunity; reduced cognitive function; limited mobility; weak cough; dependence for feeding and/or oral hygiene; frail or  deconditioned Rubye Oaks and Clearance Coots 2021). Home diet is pureed, thin liquids. PMHx of cerebral palsy, dysphagia, achalasia, diverticula, strictures, GERD, DM2, HTN, HLD, PAD, SBO, prostate cancer, and right AKA.    Assessment / Plan / Recommendation  Clinical Impression  Pt participated in clinical swallowing assessment.  PO trials limited to jello and thin liquids given restriction to clear liquids. 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  Pt was eager to eat. With assist, he drank 8 oz of thin liquid from a straw and a container of jello with adequate oral control, palpable swallow response, and no s/s of aspiration.  There were no overt s/s of esophageal issues (no reports of globus, no regurgitation).  Pt appears to be at his baseline swallowing function. When cleared by medical team, recommend advancing diet back to pureed/dyspagia 1, thin liquids; meds crushed, when possible, in puree. No acute care SLP f/u is needed. Our service will sign off. SLP Visit Diagnosis: Dysphagia, oral phase (R13.11)    Aspiration Risk    Presents with risk factors described above   Diet Recommendation    Per medical team - advance to dysphagia 1/thin liquids when medically ready  Medication Administration: Crushed with puree    Other  Recommendations Oral Care Recommendations: Oral care BID    Recommendations for follow up therapy are one component of a multi-disciplinary discharge planning process, led by the attending physician.  Recommendations may be updated based on patient status, additional functional criteria and insurance authorization.  Follow up Recommendations No SLP follow up        Swallow Study  General Date of Onset: 01/22/23 HPI: 82 Y/O M admitted from home with severe hypokalemia, acute on chronic dysphagia to solids.  PTA, unable to keep food down; regurgitation of undigested foods per report. Has been followed by SLP during prior admissions, most  recently August of '24. MBS 11/14/22: severe oral, mild-moderate pharyngeal dysphagia. Limited tongue mobility, delayed PO transit through oropharynx, no aspiration. There were multiple factors present that increased his risk of an adverse event in the presence of potential aspiration: Poor general health and/or compromised immunity; reduced cognitive function; limited mobility; weak cough; dependence for feeding and/or oral hygiene; frail or deconditioned Rubye Oaks and Clearance Coots 2021). Home diet is pureed, thin liquids. PMHx of cerebral palsy, dysphagia, achalasia, diverticula, strictures, GERD, DM2, HTN, HLD, PAD, SBO, prostate cancer, and right AKA. Type of Study: Bedside Swallow Evaluation Previous Swallow Assessment: see HPI Diet Prior to this Study: Clear liquid diet Temperature Spikes Noted: No Respiratory Status: Room air History of Recent Intubation: No Behavior/Cognition: Alert;Cooperative;Pleasant mood Oral Cavity Assessment: Within Functional Limits Oral Care Completed by SLP: No Oral Cavity - Dentition: Edentulous Self-Feeding Abilities: Needs assist Patient Positioning: Upright in bed Baseline Vocal Quality: Normal Volitional Cough: Strong Volitional Swallow: Able to elicit    Oral/Motor/Sensory Function Overall Oral Motor/Sensory Function:  (motor activity c/w cerebral palsy)   Ice Chips Ice chips: Not tested   Thin Liquid Thin Liquid: Within functional limits    Nectar Thick Nectar Thick Liquid: Not tested   Honey Thick Honey Thick Liquid: Not tested   Puree Puree: Not tested   Solid     Solid: Not tested      Blenda Mounts Laurice 01/23/2023,9:56 AM  Marchelle Folks L. Samson Frederic, MA CCC/SLP Clinical Specialist - Acute Care SLP Acute Rehabilitation Services Office number [phone]

## 2023-01-23 NOTE — Assessment & Plan Note (Signed)
Holding home metformin 500 once daily and insulin glargine 7 units daily.  Glucose 119 in ED. Glycemic control order set Very sensitive mealtime sliding scale insulin

## 2023-01-23 NOTE — Plan of Care (Signed)
  Problem: Metabolic: Goal: Ability to maintain appropriate glucose levels will improve Outcome: Progressing   Problem: Nutritional: Goal: Maintenance of adequate nutrition will improve Outcome: Progressing   Problem: Elimination: Goal: Will not experience complications related to urinary retention Outcome: Progressing

## 2023-01-23 NOTE — Hospital Course (Addendum)
Rick Schwartz is a 82 y.o.male with a history of cerebral palsy, chronic dysphagia, achalasia, Afib on Eliquis, HLD, and T2DM who was admitted to the [facility] Medicine Teaching Service at [facility] for dysphagia and hypokalemia. His hospital course is detailed below:  Hypokalemia K 2.3 on admission.  This is a chronic issue, was discharged on K 10 mEq during hospitalization in September. EKG with PVCs and nonspecific T wave changes. Mg was normal throughout hospitalization. Potassium was repleted prn and was stable by the date of discharge.  He was discharged on potassium 40 mEq daily.  Dysphagia Per sister, who is pt's legal guardian, pt had been regurgitating  food for a few days prior to admission consistent with similar presentation in Aug 24 and had a modified barium swallow that showed severe dysphagia, was sent home with home health speech therapy.  This hospitalization, evaluated by SLP, recommended DYS 1 diet with thin liquids. Was advanced to DYS 1 with thin liquids and tolerated well.  Type 2 DM Managed with SSI in hospital.  Other chronic conditions were medically managed with home medications and formulary alternatives as necessary  (Afib- eliquis 5mg  BID, HLD- crestor 20mg )  PCP Follow-up Recommendations: Recommend BMP at follow up for potassium, adjust K supplement as needed Consider spironolactone to avoid hypokalemia

## 2023-01-23 NOTE — Assessment & Plan Note (Addendum)
2.3 on admission, repleted overnight and improved to 3.4, gave another . Numerous PVCs on initial EKG and telemetry, repeat EKG with fewer PVCs. Mag 2.0.  Reassuringly, patient feels well at this time. Was discharged with daily after prior admission, this was likely insufficient dose. PM BMP Continue telemetry

## 2023-01-24 ENCOUNTER — Other Ambulatory Visit (HOSPITAL_COMMUNITY): Payer: Self-pay

## 2023-01-24 LAB — BASIC METABOLIC PANEL
Anion gap: 7 (ref 5–15)
BUN: 8 mg/dL (ref 8–23)
CO2: 23 mmol/L (ref 22–32)
Calcium: 9.3 mg/dL (ref 8.9–10.3)
Chloride: 107 mmol/L (ref 98–111)
Creatinine, Ser: 0.87 mg/dL (ref 0.61–1.24)
GFR, Estimated: >60 mL/min (ref >60)
Glucose, Bld: 115 mg/dL — ABNORMAL HIGH (ref 70–99)
Potassium: 3.8 mmol/L (ref 3.5–5.1)
Sodium: 137 mmol/L (ref 135–145)

## 2023-01-24 LAB — GLUCOSE, CAPILLARY: Glucose-Capillary: 112 mg/dL — ABNORMAL HIGH (ref 70–99)

## 2023-01-24 MED ORDER — POTASSIUM CHLORIDE CRYS ER 20 MEQ PO TBCR
40.0000 meq | EXTENDED_RELEASE_TABLET | Freq: Every day | ORAL | 0 refills | Status: DC
  Filled 2023-01-24: qty 60, 30d supply, fill #0

## 2023-01-24 MED ORDER — POTASSIUM CHLORIDE 20 MEQ PO PACK
20.0000 meq | PACK | Freq: Once | ORAL | Status: AC
  Administered 2023-01-24: 20 meq via ORAL
  Filled 2023-01-24: qty 1

## 2023-01-24 NOTE — Plan of Care (Signed)
  Problem: Fluid Volume: Goal: Ability to maintain a balanced intake and output will improve Outcome: Progressing   Problem: Metabolic: Goal: Ability to maintain appropriate glucose levels will improve Outcome: Progressing   Problem: Clinical Measurements: Goal: Ability to maintain clinical measurements within normal limits will improve Outcome: Progressing Goal: Will remain free from infection Outcome: Progressing

## 2023-01-24 NOTE — Assessment & Plan Note (Addendum)
2.3 on admission, improving with repletion. Now 3.8. Numerous PVCs on initial EKG and telemetry, repeat EKG with fewer PVCs. Mag 2.0.  Reassuringly, patient feels well at this time. Was discharged with daily after prior admission, this was likely insufficient dose. potassium today for goal 4.0 in pt with hx of CAD Continue telemetry Will send home with potassium supplementation

## 2023-01-24 NOTE — Assessment & Plan Note (Signed)
Holding home metformin 500 once daily and insulin glargine 7 units daily.  Glucose 119 in ED. Glycemic control order set Very sensitive mealtime sliding scale insulin

## 2023-01-24 NOTE — Progress Notes (Signed)
Daily Progress Note Intern Pager: [phone]  Patient name: Rick Schwartz Medical record number: [id number] Date of birth: June 24, 1940 Age: 82 y.o. Gender: male  Primary Care Provider: Vonna Drafts, MD Consultants: none Code Status: DNR-Limited  Pt Overview and Major Events to Date:  11/5: admitted to FMTS  Assessment and Plan: Rick Schwartz is a 82 y.o. male presenting with dysphagia and found to have hypokalemia. Differential for this patient's dysphagia of this includes hypokalemia, CVA (low concern as patient has history of chronic dysphagia), worsening cerebral palsy, achalasia, diverticula, strictures, GERD. Hypokalemia likely contributing to dysphagia.  Now tolerating diet well and hypokalemia much improved with repletion.    Pertinent PMH/PSH includes cerebral palsy, Afib on Eliquis, HLD, DM2  Assessment & Plan Hypokalemia 2.3 on admission, improving with repletion. Now 3.8. Numerous PVCs on initial EKG and telemetry, repeat EKG with fewer PVCs. Mag 2.0.  Reassuringly, patient feels well at this time. Was discharged with daily after prior admission, this was likely insufficient dose. potassium today for goal 4.0 in pt with hx of CAD Continue telemetry Will send home with potassium supplementation Dysphagia Chronic but worsening dysphagia started on Saturday.  Similar episode of dysphagia during last admission with hypokalemia. SLP evaluated, saw no signs of regurgitation or worsened esophageal/pharyngeal function, recommend DYS 1 diet. Advance diet to dysphagia 1 diet with thin liquids Monitor for dysphagia, regurgitation   Constipation Reported by sister on admission and pt endorsing this today. May have been contributing to poor PO intake.  --home miralax daily Type 2 diabetes mellitus with stage 2 chronic kidney disease, with long-term current use of insulin (HCC) Holding home metformin 500 once daily and insulin glargine 7 units daily.  Glucose 119 in  ED. Glycemic control order set Very sensitive mealtime sliding scale insulin   Chronic and Stable Issues: Afib: eliquis 5mg  BID HLD: crestor 20mg   FEN/GI: dysphagia 1 with thin liquids PPx: on eliquis Dispo: home today  Subjective:  Pt states he feels fine today, reports that he is eating and drinking well. Confirms that his sister takes care of him and helps with all of his needs. States he had a small bowel movement recently.  Objective: Temp:  [97.3 F (36.3 C)-97.9 F (36.6 C)] 97.3 F (36.3 C) (11/07 0446) Pulse Rate:  [71-84] 77 (11/07 0446) Resp:  [17-18] 18 (11/07 0446) BP: (120-154)/(63-88) 120/88 (11/07 0446) SpO2:  [98 %-100 %] 100 % (11/07 0446) Physical Exam: General: elderly male sitting up in bed, in NAD Cardiovascular: RRR, normal S1/S2 Respiratory: CTAB at anterior lung fields, normal WOB on RA Abdomen: high pitched bowel sounds, soft, nontender, mildly distended Extremities: no edema to LLE, R AKA  Laboratory: Most recent CBC Lab Results  Component Value Date   WBC 6.5 01/22/2023   HGB 16.0 01/22/2023   HCT 49.3 01/22/2023   MCV 88.4 01/22/2023   PLT 338 01/22/2023   Most recent BMP    Latest Ref Rng & Units 01/23/2023    3:52 PM  BMP  Glucose 70 - 99 mg/dL 88   BUN 8 - 23 mg/dL 7   Creatinine 6.30 - 1.60 mg/dL 1.09   Sodium 323 - 557 mmol/L 136   Potassium 3.5 - 5.1 mmol/L 3.6   Chloride 98 - 111 mmol/L 102   CO2 22 - 32 mmol/L 25   Calcium 8.9 - 10.3 mg/dL 8.8     Wilfrid Hyser, Tacey Ruiz, MD 01/24/2023, 7:05 AM  PGY-1, [facility] FPTS  Intern pager: [phone], text pages welcome Secure chat group [facility] Teaching Service

## 2023-01-24 NOTE — Assessment & Plan Note (Addendum)
Chronic but worsening dysphagia started on Saturday.  Similar episode of dysphagia during last admission with hypokalemia. SLP evaluated, saw no signs of regurgitation or worsened esophageal/pharyngeal function, recommend DYS 1 diet. Advance diet to dysphagia 1 diet with thin liquids Monitor for dysphagia, regurgitation

## 2023-01-24 NOTE — Discharge Summary (Addendum)
Family Medicine Teaching [facility] Discharge Summary  Patient name: Rick Schwartz Medical record number: [id number] Date of birth: [birth year] Age: 82 y.o. Gender: male Date of Admission: 01/22/2023  Date of Discharge: 01/24/23 Admitting Physician: Rick Dare, MD  Primary Care Provider: Vonna Drafts, MD Consultants: none  Indication for Hospitalization: dysphagia and hypokalemia  Discharge Diagnoses/Problem List:  Principal Problem for Admission: dysphagia and hypokalemia Other Problems addressed during stay:  Principal Problem:   Hypokalemia Active Problems:   Protein-calorie malnutrition (HCC)   Dementia without behavioral disturbance (HCC)   Spastic quadriplegic cerebral palsy (HCC)   Type 2 diabetes mellitus with stage 2 chronic kidney disease, with long-term current use of insulin (HCC)   Dysphagia   Underweight (BMI < 18.5)   Constipation    Brief Hospital Course:  Rick Schwartz is a 82 y.o.male with a history of cerebral palsy, chronic dysphagia, achalasia, Afib on Eliquis, HLD, and T2DM who was admitted to the [facility] Medicine Teaching Service at [facility] for dysphagia and hypokalemia. His hospital course is detailed below:  Hypokalemia K 2.3 on admission.  This is a chronic issue, was discharged on K 10 mEq during hospitalization in September. EKG with PVCs and nonspecific T wave changes. Mg was normal throughout hospitalization. Potassium was repleted prn and was stable by the date of discharge.  He was discharged on potassium 40 mEq daily.  Dysphagia Per sister, who is pt's legal guardian, pt had been regurgitating  food for a few days prior to admission consistent with similar presentation in Aug 24 and had a modified barium swallow that showed severe dysphagia, was sent home with home health speech therapy.  This hospitalization, evaluated by SLP, recommended DYS 1 diet with thin liquids. Was advanced to DYS 1 with thin liquids and tolerated well.  Type 2  DM Managed with SSI in hospital.  Other chronic conditions were medically managed with home medications and formulary alternatives as necessary  (Afib- eliquis 5mg  BID, HLD- crestor 20mg )  PCP Follow-up Recommendations: Recommend BMP at follow up for potassium, adjust K supplement as needed Consider spironolactone to avoid hypokalemia   Disposition: home  Discharge Condition: stable  Discharge Exam:  Vitals:   01/24/23 0446 01/24/23 0719  BP: 120/88 126/88  Pulse: 77 86  Resp: 18 16  Temp: (!) 97.3 F (36.3 C) (!) 97.5 F (36.4 C)  SpO2: 100% 100%   General: elderly male sitting up in bed, in NAD Cardiovascular: RRR, normal S1/S2 Respiratory: CTAB at anterior lung fields, normal WOB on RA Abdomen: high pitched bowel sounds, soft, nontender, mildly distended Extremities: no edema to LLE, R AKA  Significant Procedures: none  Significant Labs and Imaging:  No results for input(s): "WBC", "HGB", "HCT", "PLT" in the last 48 hours.  Recent Labs  Lab 01/22/23 1629 01/22/23 1954 01/22/23 1954 01/23/23 0504 01/23/23 0807 01/23/23 1552 01/24/23 0659  NA  --  140  --  135  --  136 137  K  --  3.1*   < > 3.4*  --  3.6 3.8  CL  --  102  --  104  --  102 107  CO2  --  26  --  24  --  25 23  GLUCOSE  --  98  --  116*  --  88 115*  BUN  --  8  --  6*  --  7* 8  CREATININE  --  1.01  --  0.93  --  0.79 0.87  CALCIUM  --  8.8*  --  8.7*  --  8.8* 9.3  MG 2.0  --   --   --  2.0  --   --   ALKPHOS 53  --   --   --   --   --   --   AST 19  --   --   --   --   --   --   ALT 11  --   --   --   --   --   --   ALBUMIN 2.6*  --   --   --   --   --   --    < > = values in this interval not displayed.     Results/Tests Pending at Time of Discharge: none  Discharge Medications:  Allergies as of 01/24/2023   No Known Allergies      Medication List     STOP taking these medications    potassium chloride 10 MEQ CR capsule Commonly known as: MICRO-K       TAKE these  medications    Eliquis 5 MG Tabs tablet Generic drug: apixaban TAKE 1 TABLET(5 MG) BY MOUTH TWICE DAILY What changed: See the new instructions.   insulin glargine 100 UNIT/ML Solostar Pen Commonly known as: LANTUS Inject 7 Units into the skin daily.   Jardiance 25 MG Tabs tablet Generic drug: empagliflozin TAKE 1 TABLET(25 MG) BY MOUTH DAILY What changed: See the new instructions.   Lancet Device Misc 1 each by Does not apply route 3 (three) times daily. May dispense any manufacturer covered by patient's insurance.   metFORMIN 500 MG 24 hr tablet Commonly known as: GLUCOPHAGE-XR Take 1 tablet (500 mg total) by mouth daily with breakfast.   potassium chloride SA 20 MEQ tablet Commonly known as: KLOR-CON M Take 2 tablets (40 mEq total) by mouth daily.   rosuvastatin 20 MG tablet Commonly known as: CRESTOR TAKE 1 TABLET(20 MG) BY MOUTH DAILY What changed:  how much to take how to take this when to take this additional instructions        Discharge Instructions: Please refer to Patient Instructions section of EMR for full details.  Patient was counseled important signs and symptoms that should prompt return to medical care, changes in medications, dietary instructions, activity restrictions, and follow up appointments.   Follow-Up Appointments:  Follow-up Information     Rick Drafts, MD Follow up.   Specialty: Family Medicine Why: 11:10 AM (arrive 15 minutes early) Contact information: [address] [place] [address] [phone]                 Rick Schwartz PGY-1, [facility]  I reviewed medical decision making and verified the service and findings are accurately documented in the intern's note.  Rick Alley, DO 01/24/2023 3:10 PM

## 2023-01-24 NOTE — Assessment & Plan Note (Signed)
Reported by sister on admission and pt endorsing this today. May have been contributing to poor PO intake.  --home miralax daily

## 2023-01-25 ENCOUNTER — Ambulatory Visit: Payer: Self-pay | Admitting: Family Medicine

## 2023-02-08 ENCOUNTER — Other Ambulatory Visit: Payer: Self-pay | Admitting: Family Medicine

## 2023-02-11 ENCOUNTER — Encounter: Payer: Self-pay | Admitting: Family Medicine

## 2023-02-11 ENCOUNTER — Ambulatory Visit (INDEPENDENT_AMBULATORY_CARE_PROVIDER_SITE_OTHER): Payer: 59 | Admitting: Family Medicine

## 2023-02-11 VITALS — BP 109/68 | HR 85

## 2023-02-11 DIAGNOSIS — E876 Hypokalemia: Secondary | ICD-10-CM | POA: Diagnosis not present

## 2023-02-11 NOTE — Assessment & Plan Note (Signed)
Stable since hospital discharge, recheck BMP today, adjust potassium supplementation as appropriate

## 2023-02-11 NOTE — Patient Instructions (Signed)
I will let you know if any results are abnormal from today

## 2023-02-11 NOTE — Progress Notes (Signed)
    SUBJECTIVE:   CHIEF COMPLAINT / HPI:   Hospital follow-up Patient admitted 11/5-11/7 for hypokalemia and dysphagia He was discharged on potassium 40 mEq daily Was evaluated by SLP who recommended DYS 1 diet with thin liquids  Per DC Summary: PCP Follow-up Recommendations: Recommend BMP at follow up for potassium, adjust K supplement as needed Consider spironolactone to avoid hypokalemia    Today, patient and sister report no concerns.  Has been tolerating diet well and has been taking all medications as prescribed   PERTINENT  PMH / PSH: Cerebral palsy  OBJECTIVE:   BP 109/68   Pulse 85   SpO2 96%   General: NAD, pleasant, wheelchair-bound Respiratory: No respiratory distress Skin: warm and dry, healing ulcer left heel Psych: Normal affect and mood  ASSESSMENT/PLAN:   Assessment & Plan Hypokalemia Stable since hospital discharge, recheck BMP today, adjust potassium supplementation as appropriate   Vonna Drafts, MD [facility]

## 2023-02-12 LAB — BASIC METABOLIC PANEL
BUN/Creatinine Ratio: 6 — ABNORMAL LOW (ref 10–24)
BUN: 6 mg/dL — ABNORMAL LOW (ref 8–27)
CO2: 23 mmol/L (ref 20–29)
Calcium: 9.7 mg/dL (ref 8.6–10.2)
Chloride: 103 mmol/L (ref 96–106)
Creatinine, Ser: 1 mg/dL (ref 0.76–1.27)
Glucose: 120 mg/dL — ABNORMAL HIGH (ref 70–99)
Potassium: 3.7 mmol/L (ref 3.5–5.2)
Sodium: 142 mmol/L (ref 134–144)
eGFR: 76 mL/min/{1.73_m2} (ref >59)

## 2023-02-26 ENCOUNTER — Other Ambulatory Visit (HOSPITAL_COMMUNITY): Payer: Self-pay

## 2023-02-26 ENCOUNTER — Other Ambulatory Visit: Payer: Self-pay

## 2023-02-26 ENCOUNTER — Other Ambulatory Visit: Payer: Self-pay | Admitting: Family Medicine

## 2023-02-26 MED ORDER — POTASSIUM CHLORIDE CRYS ER 20 MEQ PO TBCR: 40.0000 meq | EXTENDED_RELEASE_TABLET | Freq: Every day | ORAL | 0 refills | Status: DC

## 2023-03-04 ENCOUNTER — Telehealth: Payer: Self-pay

## 2023-03-04 DIAGNOSIS — R238 Other skin changes: Secondary | ICD-10-CM

## 2023-03-04 NOTE — Telephone Encounter (Signed)
Dee nurse with [facility] calls nurse line requesting a DME order.   She reports he is needing a left foot heel bootie. She reports his heel is beginning to show signs of breakdown due to pressure while lying down. No signs of infection at this time, however she wants to create a barrier.   Advised will forward to PCP to place DME.   Will send to [employer] for processing.

## 2023-03-05 NOTE — Telephone Encounter (Signed)
Community message sent to [employer] for processing.

## 2023-03-15 ENCOUNTER — Other Ambulatory Visit: Payer: Self-pay | Admitting: Family Medicine

## 2023-03-25 ENCOUNTER — Other Ambulatory Visit: Payer: Self-pay | Admitting: Family Medicine

## 2023-03-29 ENCOUNTER — Other Ambulatory Visit: Payer: Self-pay | Admitting: Family Medicine

## 2023-04-20 ENCOUNTER — Other Ambulatory Visit: Payer: Self-pay | Admitting: Student

## 2023-04-20 DIAGNOSIS — E119 Type 2 diabetes mellitus without complications: Secondary | ICD-10-CM

## 2023-06-14 ENCOUNTER — Other Ambulatory Visit: Payer: Self-pay | Admitting: Family Medicine

## 2023-07-14 ENCOUNTER — Other Ambulatory Visit: Payer: Self-pay | Admitting: Family Medicine

## 2023-07-14 DIAGNOSIS — E7800 Pure hypercholesterolemia, unspecified: Secondary | ICD-10-CM

## 2023-07-23 ENCOUNTER — Other Ambulatory Visit: Payer: Self-pay | Admitting: Family Medicine

## 2023-07-23 DIAGNOSIS — E119 Type 2 diabetes mellitus without complications: Secondary | ICD-10-CM

## 2023-08-10 ENCOUNTER — Other Ambulatory Visit: Payer: Self-pay | Admitting: Family Medicine

## 2023-08-17 ENCOUNTER — Other Ambulatory Visit: Payer: Self-pay | Admitting: Family Medicine

## 2023-10-02 ENCOUNTER — Ambulatory Visit: Admitting: Student

## 2023-10-02 ENCOUNTER — Encounter: Payer: Self-pay | Admitting: Student

## 2023-10-02 DIAGNOSIS — Z0000 Encounter for general adult medical examination without abnormal findings: Secondary | ICD-10-CM

## 2023-10-02 DIAGNOSIS — E1122 Type 2 diabetes mellitus with diabetic chronic kidney disease: Secondary | ICD-10-CM

## 2023-10-02 DIAGNOSIS — N182 Chronic kidney disease, stage 2 (mild): Secondary | ICD-10-CM

## 2023-10-02 DIAGNOSIS — Z794 Long term (current) use of insulin: Secondary | ICD-10-CM | POA: Diagnosis not present

## 2023-10-02 LAB — POCT GLYCOSYLATED HEMOGLOBIN (HGB A1C): HbA1c, POC (prediabetic range): 5.7 % (ref 5.7–6.4)

## 2023-10-02 NOTE — Progress Notes (Signed)
 Subjective:   Rick Schwartz is a 83 y.o. male who presents for Medicare Annual/Subsequent preventive examination.  The patient consented to a virtual visit. Patient consented to have virtual visit and was identified by name and date of birth. Method of visit: office Encounter participants: Patient: Rick Schwartz - located at [facility] Nurse/Provider: Damien Pinal - located at [facility] Others (if applicable): nurse and care giver    Review of Systems:  Negative        Objective:    Vitals: There were no vitals taken for this visit.  There is no height or weight on file to calculate BMI.     02/11/2023    2:29 PM 01/22/2023    9:00 PM 01/21/2023   11:35 AM 12/05/2022    9:59 AM 11/25/2022    6:00 PM 11/24/2022    9:39 PM 09/24/2022    9:31 AM  Advanced Directives  Does Patient Have a Medical Advance Directive? No No No No No No Yes  Type of Tax inspector;Living will  Does patient want to make changes to medical advance directive?       No - Patient declined  Copy of Healthcare Power of Attorney in Chart?       Yes - validated most recent copy scanned in chart (See row information)  Would patient like information on creating a medical advance directive? No - Patient declined No - Patient declined No - Patient declined No - Patient declined No - Patient declined      Tobacco Social History   Tobacco Use  Smoking Status Former   Current packs/day: 0.00   Average packs/day: 0.3 packs/day for 35.0 years (8.8 ttl pk-yrs)   Types: Cigarettes   Start date: 10/12/1959   Quit date: 10/12/1994   Years since quitting: 28.9  Smokeless Tobacco Never     Counseling given: Not Answered   Clinical Intake:                       Past Medical History:  Diagnosis Date   Acute renal failure superimposed on stage 3a chronic kidney disease (HCC) 06/13/2022   Acute respiratory failure with hypoxemia (HCC)    Aspiration pneumonia (HCC) 01/20/2021    Bradycardia 02/08/2021   Cancer (HCC)    Prostate   Cerebral palsy (HCC)    Community acquired pneumonia    Cough 01/21/2012   COVID-19 11/22/2020   Diabetes mellitus without complication (HCC) 11/15/2022   Hypercalcemia 07/18/2020   Hyperlipidemia    Hypernatremia 07/18/2020   Hypertension    Peripheral arterial disease (HCC)    critical limb ischemia   SBO (small bowel obstruction) (HCC)    Sepsis (HCC) 07/18/2020   Underweight (BMI < 18.5) 01/23/2023   Past Surgical History:  Procedure Laterality Date   AMPUTATION Right 07/22/2020   Procedure: RIGHT ABOVE KNEE AMPUTATION;  Surgeon: Harden Jerona GAILS, MD;  Location: [facility] OR;  Service: Orthopedics;  Laterality: Right;   PROSTATE SURGERY     Family History  Problem Relation Age of Onset   Diabetes Mother    Hypertension Mother    Hypertension Father    Diabetes Sister    Hypertension Sister    Diabetes Brother    Hypertension Brother    Diabetes Sister    Thyroid disease Sister    Sudden death Brother    Social History   Socioeconomic History   Marital status:  Single    Spouse name: Not on file   Number of children: Not on file   Years of education: Not on file   Highest education level: Not on file  Occupational History   Not on file  Tobacco Use   Smoking status: Former    Current packs/day: 0.00    Average packs/day: 0.3 packs/day for 35.0 years (8.8 ttl pk-yrs)    Types: Cigarettes    Start date: 10/12/1959    Quit date: 10/12/1994    Years since quitting: 28.9   Smokeless tobacco: Never  Vaping Use   Vaping status: Never Used  Substance and Sexual Activity   Alcohol use: Yes    Comment: Occasional beer and gin   Drug use: No   Sexual activity: Never    Birth control/protection: None  Other Topics Concern   Not on file  Social History Narrative   Not on file   Social Drivers of Health   Financial Resource Strain: Low Risk  (09/24/2022)   Overall Financial Resource Strain (CARDIA)    Difficulty of  Paying Living Expenses: Not hard at all  Food Insecurity: No Food Insecurity (01/22/2023)   Hunger Vital Sign    Worried About Running Out of Food in the Last Year: Never true    Ran Out of Food in the Last Year: Never true  Transportation Needs: No Transportation Needs (01/22/2023)   PRAPARE - Administrator, Civil Service (Medical): No    Lack of Transportation (Non-Medical): No  Physical Activity: Inactive (09/24/2022)   Exercise Vital Sign    Days of Exercise per Week: 0 days    Minutes of Exercise per Session: 0 min  Stress: No Stress Concern Present (09/24/2022)   [employer] of Occupational Health - Occupational Stress Questionnaire    Feeling of Stress : Not at all  Social Connections: Socially Isolated (09/24/2022)   Social Connection and Isolation Panel    Frequency of Communication with Friends and Family: More than three times a week    Frequency of Social Gatherings with Friends and Family: Three times a week    Attends Religious Services: Never    Active Member of Clubs or Organizations: No    Attends Banker Meetings: Never    Marital Status: Never married    Outpatient Encounter Medications as of 10/02/2023  Medication Sig   Continuous Glucose Sensor (FREESTYLE LIBRE 3 SENSOR) MISC PLACE 1 SENSOR ONTO THE SKIN EVERY 14 DAYS   ELIQUIS  5 MG TABS tablet TAKE 1 TABLET(5 MG) BY MOUTH TWICE DAILY   empagliflozin  (JARDIANCE ) 25 MG TABS tablet Take 1 tablet (25 mg total) by mouth daily.   insulin  glargine (LANTUS ) 100 UNIT/ML Solostar Pen Inject 7 Units into the skin daily.   Lancet Device MISC 1 each by Does not apply route 3 (three) times daily. May dispense any manufacturer covered by patient's insurance.   metFORMIN  (GLUCOPHAGE -XR) 500 MG 24 hr tablet TAKE 1 TABLET(500 MG) BY MOUTH DAILY WITH BREAKFAST   potassium chloride  SA (KLOR-CON  M) 20 MEQ tablet TAKE 2 TABLETS(40 MEQ) BY MOUTH DAILY   rosuvastatin  (CRESTOR ) 20 MG tablet Take 1 tablet (20 mg  total) by mouth daily.   No facility-administered encounter medications on file as of 10/02/2023.    Activities of Daily Living    01/22/2023    9:00 PM 11/25/2022    6:00 PM  In your present state of health, do you have any difficulty performing the following activities:  Hearing? 1 1  Vision? 1 1  Difficulty concentrating or making decisions? 0 0  Walking or climbing stairs?  1  Dressing or bathing?  1  Doing errands, shopping? 0 1    Patient Care Team: Romelle Booty, MD as PCP - General (Family Medicine)   Assessment:   This is a routine wellness examination for [place].  Exercise Activities and Dietary recommendations     Goals      Blood Pressure < 140/90     Prevent falls        Fall Risk    02/11/2023    2:28 PM 01/21/2023   11:35 AM 12/05/2022   10:02 AM 11/23/2022    9:28 AM 09/24/2022    9:32 AM  Fall Risk   Falls in the past year? 0 0 0 0 0  Number falls in past yr: 0 0 0 0 0  Injury with Fall? 0 0 0  0  Risk for fall due to :  No Fall Risks   Impaired mobility  Follow up  Falls evaluation completed   Falls prevention discussed;Education provided;Falls evaluation completed   Is the patient's home free of loose throw rugs in walkways, pet beds, electrical cords, etc?   yes      Grab bars in the bathroom? yes      Handrails on the stairs?   yes      Adequate lighting?   yes  Timed Get Up and Go Performed: not preformed, wheelchair bound  Patient rating of health (0-10): 0   Depression Screen    02/11/2023    2:28 PM 01/21/2023   11:35 AM 12/05/2022   10:01 AM 11/23/2022    9:28 AM  PHQ 2/9 Scores  PHQ - 2 Score  0 0 0  PHQ- 9 Score  0 0 3  Exception Documentation Medical reason       Cognitive Function    Scored 2 on Mini Mental: cognitive impairment present     Immunization History  Administered Date(s) Administered   Fluad Quad(high Dose 65+) 12/21/2021   Fluad Trivalent(High Dose 65+) 12/05/2022   Influenza Split 12/25/2010, 11/28/2011    Influenza Whole 01/02/2007, 01/27/2010   Influenza, High Dose Seasonal PF 01/11/2015   Influenza,inj,Quad PF,6+ Mos 01/05/2013, 01/25/2014   Influenza-Unspecified 12/28/2015, 01/07/2017   PFIZER(Purple Top)SARS-COV-2 Vaccination 05/18/2019, 06/10/2019, 02/08/2020   Pfizer Covid-19 Vaccine Bivalent Booster 23yrs & up 05/19/2021   Pfizer(Comirnaty)Fall Seasonal Vaccine 12 years and older 12/05/2022   Pneumococcal Conjugate-13 10/18/2015   Pneumococcal Polysaccharide-23 10/17/1997, 11/28/2011   Td 10/18/1998    Screening Tests Health Maintenance  Topic Date Due   FOOT EXAM  Never done   OPHTHALMOLOGY EXAM  Never done   Zoster Vaccines- Shingrix (1 of 2) Never done   DTaP/Tdap/Td (2 - Tdap) 10/17/2008   HEMOGLOBIN A1C  05/13/2023   COVID-19 Vaccine (6 - Pfizer risk 2024-25 season) 06/04/2023   Diabetic kidney evaluation - Urine ACR  06/11/2023   Medicare Annual Wellness (AWV)  09/24/2023   INFLUENZA VACCINE  10/18/2023   Diabetic kidney evaluation - eGFR measurement  02/11/2024   Pneumococcal Vaccine: 50+ Years  Completed   Hepatitis B Vaccines  Aged Out   HPV VACCINES  Aged Out   Meningococcal B Vaccine  Aged Out   Hepatitis C Screening  Discontinued   Cancer Screenings: Lung: Low Dose CT Chest recommended if Age 35-80 years, 30 pack-year currently smoking OR have quit w/in 15years. Patient does qualify. Colorectal: patient declines  further screening   Additional Screenings:        Plan:     Follow up for opthalmology exam - patient endorses trouble seeing at home.  Can consider lung CT screening  Consider AAA screening   I have personally reviewed and noted the following in the patient's chart:   Medical and social history Use of alcohol, tobacco or illicit drugs  Current medications and supplements Functional ability and status Nutritional status Physical activity Advanced directives List of other physicians Hospitalizations, surgeries, and ER visits in previous  12 months Vitals Screenings to include cognitive, depression, and falls Referrals and appointments  In addition, I have reviewed and discussed with patient certain preventive protocols, quality metrics, and best practice recommendations. A written personalized care plan for preventive services as well as general preventive health recommendations were provided to patient.    At caregivers request will obtain BMP and A1c at this visit. They understand they will need follow up with PCP in 1 week to review lab work and have a formal office visit.   Damien Pinal, DO  10/02/2023

## 2023-10-02 NOTE — Patient Instructions (Signed)
 Today at your annual preventive visit we talked about the following measures:   I recommend 150 minutes of exercise per week-try 30 minutes 5 days per week We discussed reducing sugary beverages (like soda and juice) and increasing leafy greens and whole fruits.  We discussed avoiding tobacco and alcohol.  I recommend avoiding illicit substances.  Your blood pressure is at goal.    Follow up with your regular doctor for lab results.

## 2023-10-03 ENCOUNTER — Telehealth: Payer: Self-pay

## 2023-10-03 LAB — BASIC METABOLIC PANEL WITH GFR
BUN/Creatinine Ratio: 12 (ref 10–24)
BUN: 12 mg/dL (ref 8–27)
CO2: 20 mmol/L (ref 20–29)
Calcium: 9.5 mg/dL (ref 8.6–10.2)
Chloride: 105 mmol/L (ref 96–106)
Creatinine, Ser: 0.99 mg/dL (ref 0.76–1.27)
Glucose: 114 mg/dL — ABNORMAL HIGH (ref 70–99)
Potassium: 3.9 mmol/L (ref 3.5–5.2)
Sodium: 141 mmol/L (ref 134–144)
eGFR: 76 mL/min/1.73 (ref >59)

## 2023-10-03 NOTE — Telephone Encounter (Signed)
-----   Message from Otto Fairly sent at 10/02/2023  1:52 PM EDT ----- Randie Pizza team CMA,  Please reach out to patient to schedule an appointment to discuss A1C report and DM management with PCP within this week or the next. Seen by Dr. FORBES Pinal today. Thanks.   Eniola

## 2023-10-03 NOTE — Telephone Encounter (Signed)
 Contacted patient via mychart to call office to schedule a follow up appointment with PCP sometime next week about diabetes management and an A1C.   If patient calls office please schedule an appointment with PCP.  Harlene Reiter, CMA

## 2023-10-07 ENCOUNTER — Ambulatory Visit (INDEPENDENT_AMBULATORY_CARE_PROVIDER_SITE_OTHER): Admitting: Family Medicine

## 2023-10-07 ENCOUNTER — Encounter: Payer: Self-pay | Admitting: Family Medicine

## 2023-10-07 VITALS — BP 136/80 | HR 93 | Ht 67.0 in

## 2023-10-07 DIAGNOSIS — E119 Type 2 diabetes mellitus without complications: Secondary | ICD-10-CM | POA: Diagnosis not present

## 2023-10-07 DIAGNOSIS — Z794 Long term (current) use of insulin: Secondary | ICD-10-CM | POA: Diagnosis not present

## 2023-10-07 DIAGNOSIS — E1165 Type 2 diabetes mellitus with hyperglycemia: Secondary | ICD-10-CM

## 2023-10-07 DIAGNOSIS — Z0100 Encounter for examination of eyes and vision without abnormal findings: Secondary | ICD-10-CM | POA: Diagnosis not present

## 2023-10-07 LAB — POCT GLYCOSYLATED HEMOGLOBIN (HGB A1C): HbA1c, POC (controlled diabetic range): 5.7 % (ref 0.0–7.0)

## 2023-10-07 MED ORDER — FREESTYLE LIBRE 3 READER DEVI: 1 refills | Status: DC

## 2023-10-07 MED ORDER — FREESTYLE LIBRE 3 PLUS SENSOR MISC: 2 refills | Status: DC

## 2023-10-07 NOTE — Assessment & Plan Note (Addendum)
 A1c normalized, CBG in range DC insulin  and monitor CBGs, advised to reach out if elevated again. Can consider titrating more slowly if so F/u 3 mo UACR today Referral to ophtho for eye exam

## 2023-10-07 NOTE — Progress Notes (Signed)
    SUBJECTIVE:   CHIEF COMPLAINT / HPI:   T2DM -Current medication regimen: Lantus  7u daily, Jardiance  25mg  daily, metformin  500mg  daily -Home CBGs:Per Libre: 98% in range with zero low glucose events in last 30 days -Eye exam: requesting referral  Lab Results  Component Value Date   HGBA1C 5.7 10/07/2023   HGBA1C 5.7 10/02/2023   HGBA1C 10.6 (H) 11/10/2022     PERTINENT  PMH / PSH: DM, Hfref, cerebral palsy  OBJECTIVE:   BP 136/80   Pulse 93   Ht 5' 7 (1.702 m)   SpO2 96%   BMI 17.51 kg/m   General: NAD, pleasant, in wheelchair, chronic RUE contracture, s/p RLE amputation Respiratory: No respiratory distress Skin: warm and dry, no rashes noted Psych: Normal affect and mood  ASSESSMENT/PLAN:   Assessment & Plan Type 2 diabetes mellitus with hyperglycemia, with long-term current use of insulin  (HCC) A1c normalized, CBG in range DC insulin  and monitor CBGs, advised to reach out if elevated again. Can consider titrating more slowly if so F/u 3 mo UACR today Referral to ophtho for eye exam   Payton Coward, MD [facility]

## 2023-10-07 NOTE — Patient Instructions (Addendum)
 Stop taking insulin  and keep a close eye on blood sugar  If you see fasting blood sugar above 140, let me know and you can restart the insulin   I will let you know if results from today are abnormal  You should get a call soon to schedule your eye appointment

## 2023-10-08 LAB — MICROALBUMIN / CREATININE URINE RATIO
Creatinine, Urine: 52.1 mg/dL
Microalb/Creat Ratio: 341 mg/g{creat} — ABNORMAL HIGH (ref 0–29)
Microalbumin, Urine: 177.8 ug/mL

## 2023-10-09 ENCOUNTER — Ambulatory Visit: Payer: Self-pay | Admitting: Family Medicine

## 2023-10-29 ENCOUNTER — Other Ambulatory Visit: Payer: Self-pay | Admitting: Pharmacist

## 2023-10-29 NOTE — Progress Notes (Signed)
 Discontinuing libre 3 sensor as patient already has malta 3 plus sensor in chart and libre 3 sensor is being phased out.

## 2023-11-03 ENCOUNTER — Other Ambulatory Visit: Payer: Self-pay | Admitting: Family Medicine

## 2023-12-03 ENCOUNTER — Other Ambulatory Visit: Payer: Self-pay | Admitting: Family Medicine

## 2023-12-27 ENCOUNTER — Other Ambulatory Visit (HOSPITAL_COMMUNITY): Payer: Self-pay

## 2023-12-27 ENCOUNTER — Other Ambulatory Visit: Payer: Self-pay | Admitting: Family Medicine

## 2023-12-27 ENCOUNTER — Telehealth: Payer: Self-pay

## 2023-12-27 NOTE — Telephone Encounter (Signed)
 Prior authorization submitted for FREESTYLE LIBRE 3 READER to [facility] via Latent.   Key: [id number]   Per test claim: Max 1 Quantity in 274 Days. Allow Qty 1 on 07/08/24.

## 2023-12-30 NOTE — Telephone Encounter (Signed)
 Pharmacy Patient Advocate Encounter  Received notification from [employer] that Prior Authorization for FREESTYLE LIBRE 3 READER has been APPROVED from 12/27/23 to 03/18/25   PA #/Case ID/Reference #: [id number]

## 2024-01-24 ENCOUNTER — Other Ambulatory Visit: Payer: Self-pay | Admitting: Family Medicine

## 2024-01-24 DIAGNOSIS — E1165 Type 2 diabetes mellitus with hyperglycemia: Secondary | ICD-10-CM

## 2024-01-26 ENCOUNTER — Other Ambulatory Visit: Payer: Self-pay | Admitting: Family Medicine

## 2024-02-06 ENCOUNTER — Other Ambulatory Visit: Payer: Self-pay

## 2024-02-06 ENCOUNTER — Encounter (HOSPITAL_COMMUNITY): Payer: Self-pay

## 2024-02-06 ENCOUNTER — Inpatient Hospital Stay (HOSPITAL_COMMUNITY)
Admission: EM | Admit: 2024-02-06 | Discharge: 2024-02-12 | DRG: 177 | Disposition: A | Discharge status: Home / Self Care | Attending: Family Medicine | Admitting: Family Medicine

## 2024-02-06 ENCOUNTER — Inpatient Hospital Stay (HOSPITAL_COMMUNITY)

## 2024-02-06 ENCOUNTER — Emergency Department (HOSPITAL_COMMUNITY)

## 2024-02-06 DIAGNOSIS — R1310 Dysphagia, unspecified: Secondary | ICD-10-CM | POA: Diagnosis present

## 2024-02-06 DIAGNOSIS — F0390 Unspecified dementia without behavioral disturbance: Secondary | ICD-10-CM | POA: Diagnosis present

## 2024-02-06 DIAGNOSIS — J9600 Acute respiratory failure, unspecified whether with hypoxia or hypercapnia: Secondary | ICD-10-CM | POA: Diagnosis present

## 2024-02-06 DIAGNOSIS — E1142 Type 2 diabetes mellitus with diabetic polyneuropathy: Secondary | ICD-10-CM

## 2024-02-06 DIAGNOSIS — N1831 Chronic kidney disease, stage 3a: Secondary | ICD-10-CM | POA: Diagnosis present

## 2024-02-06 DIAGNOSIS — I130 Hypertensive heart and chronic kidney disease with heart failure and stage 1 through stage 4 chronic kidney disease, or unspecified chronic kidney disease: Secondary | ICD-10-CM | POA: Diagnosis present

## 2024-02-06 DIAGNOSIS — G800 Spastic quadriplegic cerebral palsy: Secondary | ICD-10-CM | POA: Diagnosis present

## 2024-02-06 DIAGNOSIS — Z8616 Personal history of COVID-19: Secondary | ICD-10-CM | POA: Diagnosis not present

## 2024-02-06 DIAGNOSIS — Z681 Body mass index (BMI) 19 or less, adult: Secondary | ICD-10-CM | POA: Diagnosis not present

## 2024-02-06 DIAGNOSIS — I5022 Chronic systolic (congestive) heart failure: Secondary | ICD-10-CM | POA: Diagnosis present

## 2024-02-06 DIAGNOSIS — Z66 Do not resuscitate: Secondary | ICD-10-CM | POA: Diagnosis present

## 2024-02-06 DIAGNOSIS — E278 Other specified disorders of adrenal gland: Secondary | ICD-10-CM | POA: Insufficient documentation

## 2024-02-06 DIAGNOSIS — Z7901 Long term (current) use of anticoagulants: Secondary | ICD-10-CM | POA: Diagnosis not present

## 2024-02-06 DIAGNOSIS — E1122 Type 2 diabetes mellitus with diabetic chronic kidney disease: Secondary | ICD-10-CM | POA: Diagnosis present

## 2024-02-06 DIAGNOSIS — N179 Acute kidney failure, unspecified: Secondary | ICD-10-CM | POA: Diagnosis present

## 2024-02-06 DIAGNOSIS — E1151 Type 2 diabetes mellitus with diabetic peripheral angiopathy without gangrene: Secondary | ICD-10-CM | POA: Diagnosis present

## 2024-02-06 DIAGNOSIS — I4891 Unspecified atrial fibrillation: Secondary | ICD-10-CM | POA: Diagnosis present

## 2024-02-06 DIAGNOSIS — J690 Pneumonitis due to inhalation of food and vomit: Secondary | ICD-10-CM | POA: Diagnosis present

## 2024-02-06 DIAGNOSIS — E785 Hyperlipidemia, unspecified: Secondary | ICD-10-CM | POA: Diagnosis present

## 2024-02-06 DIAGNOSIS — E872 Acidosis, unspecified: Secondary | ICD-10-CM | POA: Diagnosis present

## 2024-02-06 DIAGNOSIS — E119 Type 2 diabetes mellitus without complications: Secondary | ICD-10-CM

## 2024-02-06 DIAGNOSIS — Z7984 Long term (current) use of oral hypoglycemic drugs: Secondary | ICD-10-CM | POA: Diagnosis not present

## 2024-02-06 DIAGNOSIS — E441 Mild protein-calorie malnutrition: Secondary | ICD-10-CM | POA: Diagnosis present

## 2024-02-06 DIAGNOSIS — Z789 Other specified health status: Secondary | ICD-10-CM

## 2024-02-06 DIAGNOSIS — Z1152 Encounter for screening for COVID-19: Secondary | ICD-10-CM | POA: Diagnosis not present

## 2024-02-06 DIAGNOSIS — Z89611 Acquired absence of right leg above knee: Secondary | ICD-10-CM | POA: Diagnosis not present

## 2024-02-06 DIAGNOSIS — N400 Enlarged prostate without lower urinary tract symptoms: Secondary | ICD-10-CM | POA: Diagnosis present

## 2024-02-06 DIAGNOSIS — R64 Cachexia: Secondary | ICD-10-CM | POA: Diagnosis present

## 2024-02-06 DIAGNOSIS — J9601 Acute respiratory failure with hypoxia: Secondary | ICD-10-CM | POA: Diagnosis not present

## 2024-02-06 LAB — CBC WITH DIFFERENTIAL/PLATELET
Abs Immature Granulocytes: 0.02 K/uL (ref 0.00–0.07)
Basophils Absolute: 0.1 K/uL (ref 0.0–0.1)
Basophils Relative: 1 %
Eosinophils Absolute: 0.3 K/uL (ref 0.0–0.5)
Eosinophils Relative: 2 %
HCT: 50.8 % (ref 39.0–52.0)
Hemoglobin: 14.9 g/dL (ref 13.0–17.0)
Immature Granulocytes: 0 %
Lymphocytes Relative: 36 %
Lymphs Abs: 3.8 K/uL (ref 0.7–4.0)
MCH: 25.5 pg — ABNORMAL LOW (ref 26.0–34.0)
MCHC: 29.3 g/dL — ABNORMAL LOW (ref 30.0–36.0)
MCV: 86.8 fL (ref 80.0–100.0)
Monocytes Absolute: 0.8 K/uL (ref 0.1–1.0)
Monocytes Relative: 8 %
Neutro Abs: 5.7 K/uL (ref 1.7–7.7)
Neutrophils Relative %: 53 %
Platelets: 313 K/uL (ref 150–400)
RBC: 5.85 MIL/uL — ABNORMAL HIGH (ref 4.22–5.81)
RDW: 18.2 % — ABNORMAL HIGH (ref 11.5–15.5)
WBC: 10.7 K/uL — ABNORMAL HIGH (ref 4.0–10.5)
nRBC: 0 % (ref 0.0–0.2)

## 2024-02-06 LAB — TROPONIN I (HIGH SENSITIVITY)
Troponin I (High Sensitivity): 54 ng/L — ABNORMAL HIGH (ref <18)
Troponin I (High Sensitivity): 55 ng/L — ABNORMAL HIGH (ref <18)

## 2024-02-06 LAB — I-STAT CHEM 8, ED
BUN: 7 mg/dL — ABNORMAL LOW (ref 8–23)
Calcium, Ion: 1.11 mmol/L — ABNORMAL LOW (ref 1.15–1.40)
Chloride: 108 mmol/L (ref 98–111)
Creatinine, Ser: 1 mg/dL (ref 0.61–1.24)
Glucose, Bld: 242 mg/dL — ABNORMAL HIGH (ref 70–99)
HCT: 51 % (ref 39.0–52.0)
Hemoglobin: 17.3 g/dL — ABNORMAL HIGH (ref 13.0–17.0)
Potassium: 5.2 mmol/L — ABNORMAL HIGH (ref 3.5–5.1)
Sodium: 142 mmol/L (ref 135–145)
TCO2: 22 mmol/L (ref 22–32)

## 2024-02-06 LAB — I-STAT CG4 LACTIC ACID, ED
Lactic Acid, Venous: 4.8 mmol/L (ref 0.5–1.9)
Lactic Acid, Venous: 5.4 mmol/L (ref 0.5–1.9)

## 2024-02-06 LAB — RESP PANEL BY RT-PCR (RSV, FLU A&B, COVID)  RVPGX2
Influenza A by PCR: NEGATIVE
Influenza B by PCR: NEGATIVE
Resp Syncytial Virus by PCR: NEGATIVE
SARS Coronavirus 2 by RT PCR: NEGATIVE

## 2024-02-06 LAB — BASIC METABOLIC PANEL WITH GFR
Anion gap: 16 — ABNORMAL HIGH (ref 5–15)
BUN: 7 mg/dL — ABNORMAL LOW (ref 8–23)
CO2: 18 mmol/L — ABNORMAL LOW (ref 22–32)
Calcium: 9 mg/dL (ref 8.9–10.3)
Chloride: 106 mmol/L (ref 98–111)
Creatinine, Ser: 1.2 mg/dL (ref 0.61–1.24)
GFR, Estimated: >60 mL/min (ref >60)
Glucose, Bld: 244 mg/dL — ABNORMAL HIGH (ref 70–99)
Potassium: 4.9 mmol/L (ref 3.5–5.1)
Sodium: 140 mmol/L (ref 135–145)

## 2024-02-06 LAB — GLUCOSE, CAPILLARY
Glucose-Capillary: 162 mg/dL — ABNORMAL HIGH (ref 70–99)
Glucose-Capillary: 170 mg/dL — ABNORMAL HIGH (ref 70–99)

## 2024-02-06 LAB — LACTIC ACID, PLASMA
Lactic Acid, Venous: 4.8 mmol/L (ref 0.5–1.9)
Lactic Acid, Venous: 4.2 mmol/L (ref 0.5–1.9)

## 2024-02-06 LAB — MAGNESIUM: Magnesium: 2.6 mg/dL — ABNORMAL HIGH (ref 1.7–2.4)

## 2024-02-06 LAB — BRAIN NATRIURETIC PEPTIDE: B Natriuretic Peptide: 801.3 pg/mL — ABNORMAL HIGH (ref 0.0–100.0)

## 2024-02-06 LAB — STREP PNEUMONIAE URINARY ANTIGEN: Strep Pneumo Urinary Antigen: NEGATIVE

## 2024-02-06 MED ORDER — SODIUM CHLORIDE 0.9 % IV BOLUS
500.0000 mL | Freq: Once | INTRAVENOUS | Status: AC
  Administered 2024-02-06: 500 mL via INTRAVENOUS

## 2024-02-06 MED ORDER — ACETAMINOPHEN 650 MG RE SUPP: 650.0000 mg | Freq: Four times a day (QID) | RECTAL | Status: DC | PRN

## 2024-02-06 MED ORDER — CHLORHEXIDINE GLUCONATE CLOTH 2 % EX PADS
6.0000 | MEDICATED_PAD | Freq: Every day | CUTANEOUS | Status: DC
  Administered 2024-02-06 – 2024-02-10 (×5): 6 via TOPICAL

## 2024-02-06 MED ORDER — SODIUM CHLORIDE 0.9 % IV BOLUS
1000.0000 mL | Freq: Once | INTRAVENOUS | Status: AC
  Administered 2024-02-06: 1000 mL via INTRAVENOUS

## 2024-02-06 MED ORDER — PIPERACILLIN-TAZOBACTAM 3.375 G IVPB 30 MIN
3.3750 g | Freq: Once | INTRAVENOUS | Status: AC
  Administered 2024-02-06: 3.375 g via INTRAVENOUS
  Filled 2024-02-06: qty 50

## 2024-02-06 MED ORDER — IOHEXOL 350 MG/ML SOLN
75.0000 mL | Freq: Once | INTRAVENOUS | Status: AC | PRN
  Administered 2024-02-06: 75 mL via INTRAVENOUS

## 2024-02-06 MED ORDER — ACETAMINOPHEN 325 MG PO TABS: 650.0000 mg | ORAL_TABLET | Freq: Four times a day (QID) | ORAL | Status: DC | PRN

## 2024-02-06 MED ORDER — PIPERACILLIN-TAZOBACTAM 3.375 G IVPB 30 MIN
3.3750 g | Freq: Three times a day (TID) | INTRAVENOUS | Status: DC
  Administered 2024-02-06 – 2024-02-07 (×2): 3.375 g via INTRAVENOUS
  Filled 2024-02-06 (×3): qty 50

## 2024-02-06 MED ORDER — SODIUM CHLORIDE 0.9% FLUSH
3.0000 mL | Freq: Two times a day (BID) | INTRAVENOUS | Status: DC
  Administered 2024-02-06 – 2024-02-12 (×12): 3 mL via INTRAVENOUS

## 2024-02-06 MED ORDER — SODIUM CHLORIDE 0.9 % IV SOLN: INTRAVENOUS | Status: AC

## 2024-02-06 MED ORDER — SODIUM CHLORIDE 0.9 % IV SOLN: 2.0000 g | INTRAVENOUS | Status: DC

## 2024-02-06 MED ORDER — SODIUM CHLORIDE 0.9 % IV SOLN: 500.0000 mg | INTRAVENOUS | Status: DC

## 2024-02-06 MED ORDER — ENOXAPARIN SODIUM 40 MG/0.4ML IJ SOSY
40.0000 mg | PREFILLED_SYRINGE | INTRAMUSCULAR | Status: DC
  Administered 2024-02-06: 40 mg via SUBCUTANEOUS
  Filled 2024-02-06: qty 0.4

## 2024-02-06 NOTE — ED Notes (Signed)
 Pt accompanied by this paramedic and RT to CT

## 2024-02-06 NOTE — Plan of Care (Addendum)
 FMTS Brief Progress Note  S: To bedside for night rounds Currently on bipap, tolerating, denies concerns    O: BP 120/68   Pulse 93   Temp 98.7 F (37.1 C) (Axillary)   Resp (!) 29   Ht 5' 7 (1.702 m)   Wt 49.9 kg   SpO2 100%   BMI 17.23 kg/m    Gen: NAD CV: RRR Pulm: course breath sounds b/l anteriorly. Normal WOB on bipap Ext: R AKA, chronic RUE contracture. No significant edema noted in LLE  A/P:  Resp distress 2/2 aspiration pna -continue bipap overnight -trend lactate  -on zosyn  -f/u MRSA swab, add vanc if pos -continue to assess volume status, monitor I/O. ?pulm edema on imaging. Elevated BNP. Echo ordered already  F/u CT abd/pelvis ordered earlier  Remainder of plan per HP  - Orders reviewed. Labs for AM ordered, which was adjusted as needed.  - If condition changes, plan includes page primary team.   Romelle Booty, MD 02/06/2024, 7:22 PM PGY-3, [facility] Night Resident  Please page [phone] with questions.     Addendum 8:29 PM CT abd/pelvis showed inguinal hernia with signs of possible obstruction or ischemia Given elevated lactate I consulted Dr. Sebastian with surgery who will evaluate the patient Scan also noted recommendation for further adrenal imaging CT vs MRI will defer to day team

## 2024-02-06 NOTE — Inpatient Diabetes Management (Signed)
 Inpatient Diabetes Program Recommendations  AACE/ADA: New Consensus Statement on Inpatient Glycemic Control (2015)  Target Ranges:  Prepandial:   less than 140 mg/dL      Peak postprandial:   less than 180 mg/dL (1-2 hours)      Critically ill patients:  140 - 180 mg/dL   Lab Results  Component Value Date   GLUCAP 112 (H) 01/24/2023   HGBA1C 5.7 10/07/2023    Review of Glycemic Control  Latest Reference Range & Units 02/06/24 12:52 02/06/24 13:01  Glucose 70 - 99 mg/dL 755 (H) 757 (H)  (H): Data is abnormally high  Latest Reference Range & Units 02/06/24 13:01  Potassium 3.5 - 5.1 mmol/L 5.2 (H)  (H): Data is abnormally high    Diabetes history: DM2  Outpatient Diabetes medications:  Jardiance  25 mg every day Metformin  500 mg every day  Current orders for Inpatient glycemic control: None  Inpatient Diabetes Program Recommendations:    Novolog  0-9 units TID and 0-5 at bedtime  Thank you, Wyvonna Pinal, MSN, CDCES Diabetes Coordinator Inpatient Diabetes Program [phone] (team pager from 8a-5p)

## 2024-02-06 NOTE — H&P (Signed)
 Hospital Admission History and Physical Service Pager: [phone]  Patient name: Rick Schwartz Medical record number: [id number] Date of Birth: [birth year] Age: 83 y.o. Gender: male  Primary Care Provider: Romelle Booty, MD  Consultants: None Code Status: DNR which was confirmed with family if patient unable to confirm   Preferred Emergency Contact:  Contact Information     Name Relation Home Work Mobile   [place] Sister [phone]     Christipher, Rieger [phone]        Other Contacts     Name Relation Home Work Mobile   [place] Sister   [phone]   Waldon, Sheerin Niece   [phone]        Chief Complaint: SOB  Differential and Medical Decision Making:  Rick Schwartz is a 83 y.o. male presenting with shortness of breath likely secondary to aspiration pneumonia due to patient not following pure diet, PE less likely as CT has ruled out, can consider worsening CHF exacerbation with elevated BNP. Will get repeat echo to assess cardiac function.   Will need to remain on BiPAP overnight. NPO until SLP eval. Currently DNR/DNI but can consider additional GOC conversations as needed for quality of life.   Assessment & Plan Aspiration pneumonia (HCC) - Admit to FMTS, attending Dr. Madelon  - Progressive, Vital signs per floor - Fluids: s/p 1.5 L fluid bolus, assess volume status prior to further fluid - Trend lactic acid until normal  - Goal MAP >65 - Antibiotics: Zosyn  (11/20 -) - MRSA swab pending, if positive add Vanc  - Pain control: Tylenol  650 mg q6h PRN  - Urine Legionella and strep pending  - Sputum culture pending  - Continue BiPAP overnight, attempt wean tomorrow morning  - Fall precautions - PT/OT consult once off BiPAP   - SLP consult  T2DM (type 2 diabetes mellitus) (HCC) Last A1c 5.7 4 mo ago. 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 while on BiPAP and high risk for aspiration  VTE Prophylaxis: Eliquis   Disposition: Progressive   History of Present Illness:  Rick Schwartz is a 83 y.o. male presenting with increased shortness of breath and difficulty breathing.  Sister called EMS due to respiratory distress. Denies fever, chills. Reports he was supposed to be on puree diet but was eating regular diet at home.   In the ED, respiratory distress noted and he was placed on BiPAP.  He received Solu-Medrol and route by EMS.  Labs significant for elevated BNP 801.  Chest x-ray concerning for consolidation.  CT PE showing signs of possible aspiration pneumonia with aspirate material in the right lower lobe bronchus.  FMTS call for admission.   Review Of Systems: Per HPI with the following additions: as above   Pertinent Past Medical History: CKD3a A-fib H/o aspiration pneumonia  Cerebral palsy Type 2 diabetes Hyperlipidemia HTN PAD H/o SBO BMI less than 18 Remainder reviewed in history tab.   Pertinent Past Surgical History: Right AKA Prostate surgery Remainder reviewed in history tab.   Pertinent Social History: Tobacco use: Former, quit 1996 Alcohol use: Occasional Other Substance use: Denies Lives with sister  Pertinent Family History: Mother: HTN Sister: Diabetes  Important Outpatient Medications: Eliquis  5 mg twice daily Jardiance   25 mg daily Metformin  500 mg daily Crestor  20 mg Potassium 40 mEq daily   Objective: BP 112/76 (BP Location: Left Arm)   Pulse 99   Temp (!) 97.4 F (36.3 C) (Axillary)   Resp (!) 35   Ht 5' 7 (1.702 m)   Wt 49.9 kg   SpO2 100%   BMI 17.23 kg/m  Exam: General: Chronically ill-appearing, no acute distress, cachetic  Cardio: RRR, no murmur on exam Pulm: diminished sounds at lung bases, No increased work of breathing Abdomen:  Soft, bowel sounds present, nontender Extremity: No peripheral edema, s/p R AKA, contracture noted on RUE  Labs:  CBC BMET  Recent Labs  Lab 02/06/24 1252 02/06/24 1301  WBC 10.7*  --   HGB 14.9 17.3*  HCT 50.8 51.0  PLT 313  --    Recent Labs  Lab 02/06/24 1252 02/06/24 1301  NA 140 142  K 4.9 5.2*  CL 106 108  CO2 18*  --   BUN 7* 7*  CREATININE 1.20 1.00  GLUCOSE 244* 242*  CALCIUM  9.0  --     Pertinent additional labs BNP 801, troponin 54.   EKG: Tachycardic, irregularly irregular rhythm with PACs, old anterior septal infarct, QTc 493  Imaging Studies Performed:  CXR: IMPRESSION: 1. Mild pulmonary edema. 2. Possible small right pleural effusion with blunting of the right costophrenic angle.  CT PE: IMPRESSION: 1. No evidence of pulmonary embolism. 2. Small right pleural effusion and tiny amount of left pleural fluid. Bibasilar airspace opacification right worse than left likely atelectasis, although infection is possible. Mild hazy opacification over the posterior left upper lobe which may be due to atelectasis or infection. 3. Narrowing of the right lower lobe bronchus which may contain mild aspirate material. 4. Aortic atherosclerosis. Atherosclerotic coronary artery disease. 5. Cholelithiasis versus tumefactive sludge unchanged. 6. 9 mm simple cystic lesion over the body of the pancreas unchanged. Recommend follow-up CT 1 year.  Cleotilde Perkins, DO 02/06/2024, 3:47 PM PGY-3, [facility]  FPTS Intern pager: [phone], text pages welcome Secure chat group [facility] [employer] Teaching Service

## 2024-02-06 NOTE — Assessment & Plan Note (Signed)
-   Admit to FMTS, attending Dr. Madelon  - Progressive, Vital signs per floor - Fluids: s/p 1.5 L fluid bolus, assess volume status prior to further fluid - Trend lactic acid until normal  - Goal MAP >65 - Antibiotics: Zosyn  (11/20 -) - MRSA swab pending, if positive add Vanc  - Pain control: Tylenol  650 mg q6h PRN  - Urine Legionella and strep pending  - Sputum culture pending  - Continue BiPAP overnight, attempt wean tomorrow morning  - Fall precautions - PT/OT consult once off BiPAP   - SLP consult

## 2024-02-06 NOTE — Progress Notes (Signed)
 Transported pt. From the E.R. to C.T. and then transported pt. From C.T. to the floor 2C room 1. Pt. In no distress at the present time. Heart rate 97.

## 2024-02-06 NOTE — Hospital Course (Addendum)
 Rick Schwartz is a 83 y.o. male admitted for aspiration pneumonia. His hospital course is outlined below:   Aspiration Pneumonia:  Arrived to the ED in respiratory distress. Received solumedrol by EMS and placed on BiPAP. CT PE negative for PE but showed evidence of aspiration pneumonia. LA elevated to 4.5 and he was given IVF resuscitation and started on Zosyn  (11/20-11/21). On 11/21, blood cultures showed GPC in 1/4 bottles which was likely a contaminant and pharmacy recommended to continue antibiotics at that time. He was then transitioned to IV Unasyn  (11/21-11/22) and then to oral Augmentin  (11/23-11/27). Final blood cultures showed staph hemolyticus in 1 bottle, most likely a contaminant.  Strep pneumo negative. Urine legionella negative. He was weaned off BiPAP on 11/21 and placed again the night of 11/22. He was able to be weaned off the morning of 11/23 and did not require BiPAP again throughout hospital course.  On 11/23 the patient appeared to be worsening with increased secretions and coarse rhonchi throughout all lung fields.  A stat chest x-ray, VBG, and chest PT with deep suctioning were ordered.  Chest x-ray and VBG were unremarkable.  Chest PT with deep suctioning were extremely beneficial and patient was clear throughout the rest of hospital stay.  He was placed on 2 L nasal cannula for comfort however was able to be weaned off prior to discharge and was satting well above 94% on room air.  Patient passed speech eval and MBS on 11/24.  To prevent further aspiration while admitted, the patient was placed on a soft diet and carefully monitored while eating upright.  SLP spoke to medicine team as well as patient Sister to clear for regular diet once discharged with proper seating upright and patience with solid foods.  PCP follow-up: Repeat BMP for kidney function  Restart Jardiance  once kidney function improved  Potassium supplementation held at discharge, consider restarting as  indicated 17 mm cystic lesion mid pancreas; Follow-up MRI in 6 months recommended to ensure stability if aligning with goals of care Follow-up CT imaging of adrenal incidentaloma in 6-12 months if aligning with goals of care  Outpatient cardiology follow up for reduced EF from prior study

## 2024-02-06 NOTE — Consult Note (Signed)
 Reason for Consult:LIH Referring Physician: Azion Centrella is an 83 y.o. male.  HPI: 83yo M with MMP as below admitted with aspiration PNA was noted to have LIH on CT A/P. I was asked to see due to the CT report. He is not a good historian and is on BiPAP but is able to report no pain in abdomen or groin at this time.  Past Medical History:  Diagnosis Date   Acute renal failure superimposed on stage 3a chronic kidney disease (HCC) 06/13/2022   Acute respiratory failure with hypoxemia (HCC)    Aspiration pneumonia (HCC) 01/20/2021   Bradycardia 02/08/2021   Cancer (HCC)    Prostate   Cerebral palsy (HCC)    Community acquired pneumonia    Cough 01/21/2012   COVID-19 11/22/2020   Diabetes mellitus without complication (HCC) 11/15/2022   Hypercalcemia 07/18/2020   Hyperlipidemia    Hypernatremia 07/18/2020   Hypertension    Peripheral arterial disease    critical limb ischemia   SBO (small bowel obstruction) (HCC)    Sepsis (HCC) 07/18/2020   Underweight (BMI < 18.5) 01/23/2023    Past Surgical History:  Procedure Laterality Date   AMPUTATION Right 07/22/2020   Procedure: RIGHT ABOVE KNEE AMPUTATION;  Surgeon: Harden Jerona GAILS, MD;  Location: [facility] OR;  Service: Orthopedics;  Laterality: Right;   PROSTATE SURGERY      Family History  Problem Relation Age of Onset   Diabetes Mother    Hypertension Mother    Hypertension Father    Diabetes Sister    Hypertension Sister    Diabetes Brother    Hypertension Brother    Diabetes Sister    Thyroid disease Sister    Sudden death Brother     Social History:  reports that he quit smoking about 29 years ago. His smoking use included cigarettes. He started smoking about 64 years ago. He has a 8.8 pack-year smoking history. He has never used smokeless tobacco. He reports current alcohol use. He reports that he does not use drugs.  Allergies: No Known Allergies  Medications: I have reviewed the patient's current  medications.  Results for orders placed or performed during the hospital encounter of 02/06/24 (from the past 48 hours)  Resp panel by RT-PCR (RSV, Flu A&B, Covid) Anterior Nasal Swab     Status: None   Collection Time: 02/06/24 12:40 PM   Specimen: Anterior Nasal Swab  Result Value Ref Range   SARS Coronavirus 2 by RT PCR NEGATIVE NEGATIVE   Influenza A by PCR NEGATIVE NEGATIVE   Influenza B by PCR NEGATIVE NEGATIVE    Comment: (NOTE) The Xpert Xpress SARS-CoV-2/FLU/RSV plus assay is intended as an aid in the diagnosis of influenza from Nasopharyngeal swab specimens and should not be used as a sole basis for treatment. Nasal washings and aspirates are unacceptable for Xpert Xpress SARS-CoV-2/FLU/RSV testing.  Fact Sheet for Patients: bloggercourse.com  Fact Sheet for Healthcare Providers: seriousbroker.it  This test is not yet approved or cleared by the [place]  FDA and has been authorized for detection and/or diagnosis of SARS-CoV-2 by FDA under an Emergency Use Authorization (EUA). This EUA will remain in effect (meaning this test can be used) for the duration of the COVID-19 declaration under Section 564(b)(1) of the Act, 21 U.S.C. section 360bbb-3(b)(1), unless the authorization is terminated or revoked.     Resp Syncytial Virus by PCR NEGATIVE NEGATIVE    Comment: (NOTE) Fact Sheet for Patients: bloggercourse.com  Fact Sheet  for Healthcare Providers: seriousbroker.it  This test is not yet approved or cleared by the [place]  FDA and has been authorized for detection and/or diagnosis of SARS-CoV-2 by FDA under an Emergency Use Authorization (EUA). This EUA will remain in effect (meaning this test can be used) for the duration of the COVID-19 declaration under Section 564(b)(1) of the Act, 21 U.S.C. section 360bbb-3(b)(1), unless the authorization is terminated  or revoked.  Performed at [facility], [address] [place], [place] [address]   Basic metabolic panel     Status: Abnormal   Collection Time: 02/06/24 12:52 PM  Result Value Ref Range   Sodium 140 135 - 145 mmol/L   Potassium 4.9 3.5 - 5.1 mmol/L   Chloride 106 98 - 111 mmol/L   CO2 18 (L) 22 - 32 mmol/L   Glucose, Bld 244 (H) 70 - 99 mg/dL    Comment: Glucose reference range applies only to samples taken after fasting for at least 8 hours.   BUN 7 (L) 8 - 23 mg/dL   Creatinine, Ser 8.79 0.61 - 1.24 mg/dL   Calcium  9.0 8.9 - 10.3 mg/dL   GFR, Estimated >39 >39 mL/min    Comment: (NOTE) Calculated using the CKD-EPI Creatinine Equation (2021)    Anion gap 16 (H) 5 - 15    Comment: Performed at [facility], [address], [place], [place] [address]  CBC with Differential     Status: Abnormal   Collection Time: 02/06/24 12:52 PM  Result Value Ref Range   WBC 10.7 (H) 4.0 - 10.5 K/uL   RBC 5.85 (H) 4.22 - 5.81 MIL/uL   Hemoglobin 14.9 13.0 - 17.0 g/dL   HCT 49.1 60.9 - 47.9 %   MCV 86.8 80.0 - 100.0 fL   MCH 25.5 (L) 26.0 - 34.0 pg   MCHC 29.3 (L) 30.0 - 36.0 g/dL   RDW 81.7 (H) 88.4 - 84.4 %   Platelets 313 150 - 400 K/uL   nRBC 0.0 0.0 - 0.2 %   Neutrophils Relative % 53 %   Neutro Abs 5.7 1.7 - 7.7 K/uL   Lymphocytes Relative 36 %   Lymphs Abs 3.8 0.7 - 4.0 K/uL   Monocytes Relative 8 %   Monocytes Absolute 0.8 0.1 - 1.0 K/uL   Eosinophils Relative 2 %   Eosinophils Absolute 0.3 0.0 - 0.5 K/uL   Basophils Relative 1 %   Basophils Absolute 0.1 0.0 - 0.1 K/uL   Immature Granulocytes 0 %   Abs Immature Granulocytes 0.02 0.00 - 0.07 K/uL    Comment: Performed at [facility], [address] [place], [place] [address]  Brain natriuretic peptide     Status: Abnormal   Collection Time: 02/06/24 12:52 PM  Result Value Ref Range   B Natriuretic Peptide 801.3 (H) 0.0 - 100.0 pg/mL    Comment: Performed at [facility], [address],  [place], [place] [address]  Troponin I (High Sensitivity)     Status: Abnormal   Collection Time: 02/06/24 12:52 PM  Result Value Ref Range   Troponin I (High Sensitivity) 54 (H) <18 ng/L    Comment: (NOTE) Elevated high sensitivity troponin I (hsTnI) values and significant  changes across serial measurements may suggest ACS but many other  chronic and acute conditions are known to elevate hsTnI results.  Refer to the Links section for chest pain algorithms and additional  guidance. Performed at [facility],  [address] [place], [place] [address]   Magnesium      Status: Abnormal   Collection Time: 02/06/24 12:52 PM  Result Value Ref Range   Magnesium  2.6 (H) 1.7 - 2.4 mg/dL    Comment: Performed at [facility], [address] [place], [place] [address]  I-stat chem 8, ED (not at [facility], [facility] or [facility])     Status: Abnormal   Collection Time: 02/06/24  1:01 PM  Result Value Ref Range   Sodium 142 135 - 145 mmol/L   Potassium 5.2 (H) 3.5 - 5.1 mmol/L   Chloride 108 98 - 111 mmol/L   BUN 7 (L) 8 - 23 mg/dL   Creatinine, Ser 8.99 0.61 - 1.24 mg/dL   Glucose, Bld 757 (H) 70 - 99 mg/dL    Comment: Glucose reference range applies only to samples taken after fasting for at least 8 hours.   Calcium , Ion 1.11 (L) 1.15 - 1.40 mmol/L   TCO2 22 22 - 32 mmol/L   Hemoglobin 17.3 (H) 13.0 - 17.0 g/dL   HCT 48.9 60.9 - 47.9 %  Troponin I (High Sensitivity)     Status: Abnormal   Collection Time: 02/06/24  2:52 PM  Result Value Ref Range   Troponin I (High Sensitivity) 55 (H) <18 ng/L    Comment: (NOTE) Elevated high sensitivity troponin I (hsTnI) values and significant  changes across serial measurements may suggest ACS but many other  chronic and acute conditions are known to elevate hsTnI results.  Refer to the Links section for chest pain algorithms and additional  guidance. Performed at [facility], [address], [place], [place] [address]   I-Stat CG4 Lactic Acid      Status: Abnormal   Collection Time: 02/06/24  4:01 PM  Result Value Ref Range   Lactic Acid, Venous 4.8 (HH) 0.5 - 1.9 mmol/L   Comment NOTIFIED PHYSICIAN   Lactic acid, plasma     Status: Abnormal   Collection Time: 02/06/24  4:30 PM  Result Value Ref Range   Lactic Acid, Venous 4.8 (HH) 0.5 - 1.9 mmol/L    Comment: CRITICAL RESULT CALLED TO, READ BACK BY AND VERIFIED WITH lL BOWERS RN 1918 02/06/2024 WBOND CRITICAL RESULT CALLED TO, READ BACK BY AND VERIFIED WITH: JINNY SAHARA RN [id number] 02/06/2024 WBOND Performed at [facility], [address], [place], [place] [address] CORRECTED ON 11/20 AT 1927: PREVIOUSLY REPORTED AS 4.8 CRITICAL RESULT CALLED TO, READ BACK BY AND VERIFIED WITH lL BOWERS RN 1918 02/06/2024 WBOND   I-Stat CG4 Lactic Acid     Status: Abnormal   Collection Time: 02/06/24  6:00 PM  Result Value Ref Range   Lactic Acid, Venous 5.4 (HH) 0.5 - 1.9 mmol/L   Comment NOTIFIED PHYSICIAN   Glucose, capillary     Status: Abnormal   Collection Time: 02/06/24  8:32 PM  Result Value Ref Range   Glucose-Capillary 162 (H) 70 - 99 mg/dL    Comment: Glucose reference range applies only to samples taken after fasting for at least 8 hours.   Comment 1 Notify RN     CT ABDOMEN PELVIS W CONTRAST Result Date: 02/06/2024 EXAM: CT ABDOMEN AND PELVIS WITH CONTRAST 02/06/2024 07:50:54 PM TECHNIQUE: CT of the abdomen and pelvis was performed with the administration of 75 mL of iohexol  (OMNIPAQUE ) 350 MG/ML injection. Multiplanar reformatted images are provided for review. Automated exposure control, iterative reconstruction, and/or weight-based adjustment of the mA/kV was utilized  to reduce the radiation dose to as low as reasonably achievable. COMPARISON: None available. CLINICAL HISTORY: Abdominal pain, acute, nonlocalized. FINDINGS: LOWER CHEST: Trace bilateral, right greater than left, pleural effusions. Associated bilateral lower lobe opacified atelectasis. LIVER: The liver is  unremarkable. GALLBLADDER AND BILE DUCTS: Calcific gallstones within the gallbladder lumen. No gallbladder wall thickening or pericholecystic fluid. No biliary ductal dilatation. SPLEEN: No acute abnormality. PANCREAS: Couple fluid density lesions within the pancreatic body measuring up to 1.2cm and 1.7 cm. ADRENAL GLANDS: Persistent 3.2 x 2 cm left adrenal nodule that appears heterogeneous with density of 47 Hounsfield units. Right 0.9 cm adrenal gland nodule with a density of 77 Hounsfield units. KIDNEYS, URETERS AND BLADDER: No stones in the kidneys or ureters. No hydronephrosis. No perinephric or periureteral stranding. Subcentimeter hypodense lesions within the kidneys. Fluid attenuations within the kidneys likely represent simple renal cysts. Per consensus, no follow-up is needed for simple Bosniak type 1 and 2 renal cysts, unless the patient has a malignancy history or risk factors. Heterogeneous peripherally calcified 1.3 cm left renal lesion. Urinary bladder decompressed with Foley catheter tip and balloon within the lumen. GI AND BOWEL: Stomach demonstrates no acute abnormality. No small or large bowel wall thickening or dilatation. The appendix is unremarkable. Left inguinal hernia containing a short loop of colon as well as mesenteric fat with an abdominal defect of 1.7 cm. Diastasis recti. Small fat-containing umbilical hernia. There is no bowel obstruction. PERITONEUM AND RETROPERITONEUM: No ascites. No free air. VASCULATURE: Severe atherosclerotic plaque. Aorta is normal in caliber. LYMPH NODES: No lymphadenopathy. REPRODUCTIVE ORGANS: Multiple radiation seeds along the prostate. BONES AND SOFT TISSUES: Mild lateral subluxation of L1 on L2 and L2 on L3. Associated osseous neural foraminal stenosis at these levels. No severe osseous central canal stenosis. No focal soft tissue abnormality. IMPRESSION: 1. Trace bilateral pleural effusions, right greater than left, with associated bilateral lower lobe  atelectasis. 2. Cholelithiasis with no CT evidence of acute cholecystitis. 3. Persistent 3.2 x 2 cm left adrenal nodule and a 0.9 cm right adrenal nodule ; recommend adrenal washout CT or chemical-shift MR and biochemical evaluation, Indeterminate. 4. Left inguinal hernia containing a short loop of colon and mesenteric fat with an abdominal defect of 1.7 cm. Associated findings suggest ischemia or obstruction. 5. Indeterminate 1.2cm and 1.7 cm fluid density lesion within the pancreatic body. Recommend attention on follow-up adrenal cross-sectional imaging. Electronically signed by: Morgane Naveau MD 02/06/2024 08:16 PM EST RP Workstation: [id number]   CT Angio Chest PE W and/or Wo Contrast Result Date: 02/06/2024 CLINICAL DATA:  Worsening shortness of breath. Possible pulmonary embolism. EXAM: CT ANGIOGRAPHY CHEST WITH CONTRAST TECHNIQUE: Multidetector CT imaging of the chest was performed using the standard protocol during bolus administration of intravenous contrast. Multiplanar CT image reconstructions and MIPs were obtained to evaluate the vascular anatomy. RADIATION DOSE REDUCTION: This exam was performed according to the departmental dose-optimization program which includes automated exposure control, adjustment of the mA and/or kV according to patient size and/or use of iterative reconstruction technique. CONTRAST:  75mL OMNIPAQUE  IOHEXOL  350 MG/ML SOLN COMPARISON:  11/16/2022 FINDINGS: Cardiovascular: Heart is normal size. There is calcified plaque over the left main and 3 vessel coronary arteries. Calcified plaque throughout the thoracic aorta. Thoracic aorta is otherwise normal in caliber. Pulmonary arterial system is well opacified and demonstrates no definite pulmonary emboli. Remaining vascular structures are unremarkable. Mediastinum/Nodes: Stable 1.1 cm right subcarinal lymph node likely reactive. Otherwise, no significant mediastinal or hilar adenopathy. Lungs/Pleura: Lungs are adequately  inflated and demonstrate a small right pleural effusion and tiny amount of left pleural fluid. There is bibasilar airspace opacification over the lower lobes right worse than left likely atelectasis although infection is possible. Mild hazy opacification over the posterior left upper lobe which again may be due to atelectasis or infection. Calcified granuloma over the left upper lobe and lingula. Narrowing of the right lower lobe bronchus which may contain mild aspirate material. Upper Abdomen: Limited images through the upper abdomen demonstrate evidence of cholelithiasis versus tumefactive sludge unchanged. Calcified plaque over the abdominal aorta. 9 mm simple cystic lesion over the body of the pancreas unchanged. No acute findings. Musculoskeletal: Several lucent lesions throughout the spine unchanged and likely benign. Few Schmorl's nodes are present. Review of the MIP images confirms the above findings. IMPRESSION: 1. No evidence of pulmonary embolism. 2. Small right pleural effusion and tiny amount of left pleural fluid. Bibasilar airspace opacification right worse than left likely atelectasis, although infection is possible. Mild hazy opacification over the posterior left upper lobe which may be due to atelectasis or infection. 3. Narrowing of the right lower lobe bronchus which may contain mild aspirate material. 4. Aortic atherosclerosis. Atherosclerotic coronary artery disease. 5. Cholelithiasis versus tumefactive sludge unchanged. 6. 9 mm simple cystic lesion over the body of the pancreas unchanged. Recommend follow-up CT 1 year. Aortic Atherosclerosis (ICD10-I70.0). Electronically Signed   By: Toribio Agreste M.D.   On: 02/06/2024 15:22   DG Chest Port 1 View Result Date: 02/06/2024 EXAM: 1 VIEW(S) XRAY OF THE CHEST 02/06/2024 01:02:37 PM COMPARISON: 01/22/2023 CLINICAL HISTORY: sob FINDINGS: LUNGS AND PLEURA: Mild pulmonary edema. Low lung volumes. Elevated right hemidiaphragm. SABRA Possible small right  pleural effusion. No pneumothorax. HEART AND MEDIASTINUM: No acute abnormality of the cardiac and mediastinal silhouettes. BONES AND SOFT TISSUES: No acute osseous abnormality. IMPRESSION: 1. Mild pulmonary edema. 2. Possible small right pleural effusion with blunting of the right costophrenic angle. Electronically signed by: Waddell Calk MD 02/06/2024 01:28 PM EST RP Workstation: [id number]    Review of Systems  Unable to perform ROS: Other   Blood pressure (!) 133/99, pulse 86, temperature 98.4 F (36.9 C), temperature source Axillary, resp. rate (!) 29, height 5' 7 (1.702 m), weight 62 kg, SpO2 100%. Physical Exam Cardiovascular:     Rate and Rhythm: Normal rate and regular rhythm.  Pulmonary:     Effort: Pulmonary effort is normal.     Comments: Few rhonchi, on BiPAP Abdominal:     Comments: Some distention but soft and NT Umbilical hernia reduces easily LIH also reduces easily, no pain on exam  Musculoskeletal:     Comments: R AKA, contractions BUE  Neurological:     Mental Status: He is alert.     Comments: Suspect baseline     Assessment/Plan: Left inguinal hernia - easily reduces, nontender. I believe there is a typo in the CT report as it contradicts itself.  Umbilical hernia - easily reduces  No need for emergent intervention on either hernia. He may F/U in our office PRN.   ISTVAN BEHAR 02/06/2024, 8:39 PM

## 2024-02-06 NOTE — ED Notes (Signed)
 MD made aware bladder scan volume 358

## 2024-02-06 NOTE — ED Triage Notes (Signed)
 Pt BIB EMS for increased work of breathing and SOB. Pt is on CPAP on arrival.   EMS Vitals  Afib 130-150 BP 180/130 5 albuterol   Atrovent 2 mag 125 solumedrol

## 2024-02-06 NOTE — ED Notes (Signed)
MD made aware of elevated lactic.  

## 2024-02-06 NOTE — ED Notes (Signed)
 RT in room, pt placed on bi-pap

## 2024-02-06 NOTE — Assessment & Plan Note (Signed)
 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

## 2024-02-06 NOTE — Assessment & Plan Note (Signed)
 Last A1c 5.7 4 mo ago. Home meds: metformin  500 mg daily, Jardiance  25 mg daily  - add AC QS monitoring he has a diet  - monitor CBGs on steroids

## 2024-02-06 NOTE — ED Provider Notes (Signed)
 [facility]ERG[facility] DEPARTMENT AT [facility] Provider Note   CSN: [id number] Arrival date & time: 02/06/24  1235     Patient presents with: Respiratory Distress   Rick Schwartz is a 83 y.o. male.   Pt is a 83 yo male with pmhx significant for CP, DM2, prostate cancer, HLD, SBO, CKD, and critical limb ischemia s/p R AKA.  Pt is unable to give any hx.  Per EMS, they were called out for SOB.  They gave him 5 albuterol , atrovent, mg, and solumedrol and put him on cpap. He has improved some.  Per EMS, initial HR was b/t 130s and 150s.         Prior to Admission medications   Medication Sig Start Date End Date Taking? Authorizing Provider  Continuous Glucose Sensor (FREESTYLE LIBRE 3 SENSOR) MISC PLACE 1 SENSOR ONTO THE SKIN EVERY 14 DAYS 01/25/24  Yes Romelle Booty, MD  dextromethorphan-guaiFENesin  (MUCINEX  DM) 30-600 MG 12hr tablet Take 1 tablet by mouth daily.   Yes [provider]  ELIQUIS  5 MG TABS tablet TAKE 1 TABLET(5 MG) BY MOUTH TWICE DAILY 01/27/24  Yes Mahmood, Booty, MD  JARDIANCE  25 MG TABS tablet TAKE 1 TABLET(25 MG) BY MOUTH DAILY 12/03/23  Yes Romelle Booty, MD  metFORMIN  (GLUCOPHAGE -XR) 500 MG 24 hr tablet TAKE 1 TABLET(500 MG) BY MOUTH DAILY WITH BREAKFAST 12/29/23  Yes Romelle Booty, MD  potassium chloride  SA (KLOR-CON  M) 20 MEQ tablet TAKE 2 TABLETS(40 MEQ) BY MOUTH DAILY 08/19/23  Yes Romelle Booty, MD  rosuvastatin  (CRESTOR ) 20 MG tablet Take 1 tablet (20 mg total) by mouth daily. 07/15/23  Yes Romelle Booty, MD  Lancet Device MISC 1 each by Does not apply route 3 (three) times daily. May dispense any manufacturer covered by patient's insurance. 11/16/22   Dahbura, Anton, DO    Allergies: Patient has no known allergies.    Review of Systems  Unable to perform ROS: Severe respiratory distress  Respiratory:  Positive for shortness of breath.   All other systems reviewed and are negative.   Updated Vital Signs BP 112/76 (BP Location: Left Arm)    Pulse 99   Temp (!) 97.4 F (36.3 C) (Axillary)   Resp (!) 35   Ht 5' 7 (1.702 m)   Wt 49.9 kg   SpO2 100%   BMI 17.23 kg/m   Physical Exam Vitals and nursing note reviewed.  Constitutional:      General: He is in acute distress.     Appearance: Normal appearance. He is ill-appearing.  HENT:     Head: Normocephalic and atraumatic.     Right Ear: External ear normal.     Left Ear: External ear normal.     Nose: Nose normal.     Mouth/Throat:     Mouth: Mucous membranes are dry.  Eyes:     Extraocular Movements: Extraocular movements intact.     Conjunctiva/sclera: Conjunctivae normal.     Pupils: Pupils are equal, round, and reactive to light.  Cardiovascular:     Rate and Rhythm: Tachycardia present. Rhythm irregular.     Comments: PVCs Pulmonary:     Effort: Respiratory distress present.  Abdominal:     General: Abdomen is flat. Bowel sounds are normal.  Musculoskeletal:     Cervical back: Normal range of motion and neck supple.     Comments: Right aka  Skin:    Capillary Refill: Capillary refill takes less than 2 seconds.  Neurological:  Mental Status: He is alert.     Comments: Pt is alert.  He is not verbal.  It is unclear if this is his normal or not.  Psychiatric:     Comments: Unable to assess     (all labs ordered are listed, but only abnormal results are displayed) Labs Reviewed  BASIC METABOLIC PANEL WITH GFR - Abnormal; Notable for the following components:      Result Value   CO2 18 (*)    Glucose, Bld 244 (*)    BUN 7 (*)    Anion gap 16 (*)    All other components within normal limits  CBC WITH DIFFERENTIAL/PLATELET - Abnormal; Notable for the following components:   WBC 10.7 (*)    RBC 5.85 (*)    MCH 25.5 (*)    MCHC 29.3 (*)    RDW 18.2 (*)    All other components within normal limits  BRAIN NATRIURETIC PEPTIDE - Abnormal; Notable for the following components:   B Natriuretic Peptide 801.3 (*)    All other components within normal  limits  MAGNESIUM  - Abnormal; Notable for the following components:   Magnesium  2.6 (*)    All other components within normal limits  I-STAT CHEM 8, ED - Abnormal; Notable for the following components:   Potassium 5.2 (*)    BUN 7 (*)    Glucose, Bld 242 (*)    Calcium , Ion 1.11 (*)    Hemoglobin 17.3 (*)    All other components within normal limits  TROPONIN I (HIGH SENSITIVITY) - Abnormal; Notable for the following components:   Troponin I (High Sensitivity) 54 (*)    All other components within normal limits  RESP PANEL BY RT-PCR (RSV, FLU A&B, COVID)  RVPGX2  CULTURE, BLOOD (ROUTINE X 2)  CULTURE, BLOOD (ROUTINE X 2)  I-STAT CG4 LACTIC ACID, ED  TROPONIN I (HIGH SENSITIVITY)    EKG: EKG Interpretation Date/Time:  Thursday February 06 2024 12:37:46 EST Ventricular Rate:  137 PR Interval:  83 QRS Duration:  99 QT Interval:  326 QTC Calculation: 493 R Axis:   100  Text Interpretation: Sinus tachycardia Ventricular premature complex Left atrial enlargement Low voltage, extremity and precordial leads Anteroseptal infarct, old ST depression, probably rate related Confirmed by Dean Clarity [phone]) on 02/06/2024 3:37:38 PM  Radiology: CT Angio Chest PE W and/or Wo Contrast Result Date: 02/06/2024 CLINICAL DATA:  Worsening shortness of breath. Possible pulmonary embolism. EXAM: CT ANGIOGRAPHY CHEST WITH CONTRAST TECHNIQUE: Multidetector CT imaging of the chest was performed using the standard protocol during bolus administration of intravenous contrast. Multiplanar CT image reconstructions and MIPs were obtained to evaluate the vascular anatomy. RADIATION DOSE REDUCTION: This exam was performed according to the departmental dose-optimization program which includes automated exposure control, adjustment of the mA and/or kV according to patient size and/or use of iterative reconstruction technique. CONTRAST:  75mL OMNIPAQUE  IOHEXOL  350 MG/ML SOLN COMPARISON:  11/16/2022 FINDINGS:  Cardiovascular: Heart is normal size. There is calcified plaque over the left main and 3 vessel coronary arteries. Calcified plaque throughout the thoracic aorta. Thoracic aorta is otherwise normal in caliber. Pulmonary arterial system is well opacified and demonstrates no definite pulmonary emboli. Remaining vascular structures are unremarkable. Mediastinum/Nodes: Stable 1.1 cm right subcarinal lymph node likely reactive. Otherwise, no significant mediastinal or hilar adenopathy. Lungs/Pleura: Lungs are adequately inflated and demonstrate a small right pleural effusion and tiny amount of left pleural fluid. There is bibasilar airspace opacification over the lower lobes right worse  than left likely atelectasis although infection is possible. Mild hazy opacification over the posterior left upper lobe which again may be due to atelectasis or infection. Calcified granuloma over the left upper lobe and lingula. Narrowing of the right lower lobe bronchus which may contain mild aspirate material. Upper Abdomen: Limited images through the upper abdomen demonstrate evidence of cholelithiasis versus tumefactive sludge unchanged. Calcified plaque over the abdominal aorta. 9 mm simple cystic lesion over the body of the pancreas unchanged. No acute findings. Musculoskeletal: Several lucent lesions throughout the spine unchanged and likely benign. Few Schmorl's nodes are present. Review of the MIP images confirms the above findings. IMPRESSION: 1. No evidence of pulmonary embolism. 2. Small right pleural effusion and tiny amount of left pleural fluid. Bibasilar airspace opacification right worse than left likely atelectasis, although infection is possible. Mild hazy opacification over the posterior left upper lobe which may be due to atelectasis or infection. 3. Narrowing of the right lower lobe bronchus which may contain mild aspirate material. 4. Aortic atherosclerosis. Atherosclerotic coronary artery disease. 5.  Cholelithiasis versus tumefactive sludge unchanged. 6. 9 mm simple cystic lesion over the body of the pancreas unchanged. Recommend follow-up CT 1 year. Aortic Atherosclerosis (ICD10-I70.0). Electronically Signed   By: Toribio Agreste M.D.   On: 02/06/2024 15:22   DG Chest Port 1 View Result Date: 02/06/2024 EXAM: 1 VIEW(S) XRAY OF THE CHEST 02/06/2024 01:02:37 PM COMPARISON: 01/22/2023 CLINICAL HISTORY: sob FINDINGS: LUNGS AND PLEURA: Mild pulmonary edema. Low lung volumes. Elevated right hemidiaphragm. SABRA Possible small right pleural effusion. No pneumothorax. HEART AND MEDIASTINUM: No acute abnormality of the cardiac and mediastinal silhouettes. BONES AND SOFT TISSUES: No acute osseous abnormality. IMPRESSION: 1. Mild pulmonary edema. 2. Possible small right pleural effusion with blunting of the right costophrenic angle. Electronically signed by: Waddell Calk MD 02/06/2024 01:28 PM EST RP Workstation: [id number]     Procedures   Medications Ordered in the ED  piperacillin -tazobactam (ZOSYN ) IVPB 3.375 g (has no administration in time range)  sodium chloride  0.9 % bolus 500 mL (has no administration in time range)  iohexol  (OMNIPAQUE ) 350 MG/ML injection 75 mL (75 mLs Intravenous Contrast Given 02/06/24 1439)                                    Medical Decision Making Amount and/or Complexity of Data Reviewed Labs: ordered. Radiology: ordered.  Risk Prescription drug management. Decision regarding hospitalization.   This patient presents to the ED for concern of sob, this involves an extensive number of treatment options, and is a complaint that carries with it a high risk of complications and morbidity.  The differential diagnosis includes covid/flu/rsv, pna, bronchitis, chf   Co morbidities that complicate the patient evaluation  CP, DM2, prostate cancer, HLD, SBO, CKD, and critical limb ischemia s/p R AKA   Additional history obtained:  Additional history obtained from epic  chart review External records from outside source obtained and reviewed including EMS report   Lab Tests:  I Ordered, and personally interpreted labs.  The pertinent results include:  cbc with wbc sl elevated at 10.7; mg elevated at 2.6; trop elevated at 54; bnp elevated at 801.3   Imaging Studies ordered:  I ordered imaging studies including cxr, ct chest  I independently visualized and interpreted imaging which showed  CXR: Mild pulmonary edema.  2. Possible small right pleural effusion with blunting of the right  costophrenic angle.  CT chest: . No evidence of pulmonary embolism.  2. Small right pleural effusion and tiny amount of left pleural  fluid. Bibasilar airspace opacification right worse than left likely  atelectasis, although infection is possible. Mild hazy opacification  over the posterior left upper lobe which may be due to atelectasis  or infection.  3. Narrowing of the right lower lobe bronchus which may contain mild  aspirate material.  4. Aortic atherosclerosis. Atherosclerotic coronary artery disease.  5. Cholelithiasis versus tumefactive sludge unchanged.  6. 9 mm simple cystic lesion over the body of the pancreas  unchanged. Recommend follow-up CT 1 year.    Aortic Atherosclerosis (ICD10-I70.0).   I agree with the radiologist interpretation   Cardiac Monitoring:  The patient was maintained on a cardiac monitor.  I personally viewed and interpreted the cardiac monitored which showed an underlying rhythm of: st with pvcs   Medicines ordered and prescription drug management:  I ordered medication including zosyn   for sx  Reevaluation of the patient after these medicines showed that the patient improved I have reviewed the patients home medicines and have made adjustments as needed   Test Considered:  ct   Critical Interventions:  bipap   Consultations Obtained:  I requested consultation with the FP residents,  and discussed lab and  imaging findings as well as pertinent plan - they will admit   Problem List / ED Course:  Resp failure:  I suspect this is due to aspiration pna.  I spoke with his sister and legal guardian Jethro Horton).  She said they stopped giving him pureed food about 6 months ago and have been giving him whatever he wanted, but he does have a hx of aspiration.  Bipap has helped pt significantly.   Reevaluation:  After the interventions noted above, I reevaluated the patient and found that they have :improved   Social Determinants of Health:  Lives at home with his sister and legal guardian   Dispostion:  After consideration of the diagnostic results and the patients response to treatment, I feel that the patent would benefit from admission.  CRITICAL CARE Performed by: Mliss Boyers   Total critical care time: 30 minutes  Critical care time was exclusive of separately billable procedures and treating other patients.  Critical care was necessary to treat or prevent imminent or life-threatening deterioration.  Critical care was time spent personally by me on the following activities: development of treatment plan with patient and/or surrogate as well as nursing, discussions with consultants, evaluation of patient's response to treatment, examination of patient, obtaining history from patient or surrogate, ordering and performing treatments and interventions, ordering and review of laboratory studies, ordering and review of radiographic studies, pulse oximetry and re-evaluation of patient's condition.        Final diagnoses:  Acute respiratory failure, unspecified whether with hypoxia or hypercapnia (HCC)  Aspiration pneumonia of both lungs due to gastric secretions, unspecified part of lung [facility])    ED Discharge Orders     None          Boyers Mliss, MD 02/06/24 1549

## 2024-02-06 NOTE — ED Notes (Signed)
 Report given to Meagan on 2C. Awaiting RT for transport to CT then unit

## 2024-02-06 NOTE — ED Notes (Signed)
 Help get patient on the monitor did EKG shown to Dr Dean patient is resting with call bell in reach

## 2024-02-07 ENCOUNTER — Inpatient Hospital Stay (HOSPITAL_COMMUNITY)

## 2024-02-07 DIAGNOSIS — I4891 Unspecified atrial fibrillation: Secondary | ICD-10-CM | POA: Diagnosis not present

## 2024-02-07 DIAGNOSIS — J690 Pneumonitis due to inhalation of food and vomit: Secondary | ICD-10-CM | POA: Diagnosis not present

## 2024-02-07 DIAGNOSIS — E872 Acidosis, unspecified: Secondary | ICD-10-CM | POA: Diagnosis not present

## 2024-02-07 DIAGNOSIS — E1142 Type 2 diabetes mellitus with diabetic polyneuropathy: Secondary | ICD-10-CM | POA: Diagnosis not present

## 2024-02-07 LAB — BLOOD CULTURE ID PANEL (REFLEXED) - BCID2

## 2024-02-07 LAB — ECHOCARDIOGRAM COMPLETE
AR max vel: 2.23 cm2
AV Peak grad: 2.7 mmHg
Ao pk vel: 0.83 m/s
Area-P 1/2: 5.54 cm2
Calc EF: 28.7 %
Height: 67 in
S' Lateral: 4.1 cm
Single Plane A2C EF: 24 %
Single Plane A4C EF: 33.2 %
Weight: 2137.58 [oz_av]

## 2024-02-07 LAB — CBC
HCT: 46 % (ref 39.0–52.0)
Hemoglobin: 13.9 g/dL (ref 13.0–17.0)
MCH: 25.1 pg — ABNORMAL LOW (ref 26.0–34.0)
MCHC: 30.2 g/dL (ref 30.0–36.0)
MCV: 83.2 fL (ref 80.0–100.0)
Platelets: 222 K/uL (ref 150–400)
RBC: 5.53 MIL/uL (ref 4.22–5.81)
RDW: 17.8 % — ABNORMAL HIGH (ref 11.5–15.5)
WBC: 8.4 K/uL (ref 4.0–10.5)
nRBC: 0 % (ref 0.0–0.2)

## 2024-02-07 LAB — COMPREHENSIVE METABOLIC PANEL WITH GFR
ALT: 16 U/L (ref 0–44)
ALT: 28 U/L (ref 0–44)
AST: 20 U/L (ref 15–41)
AST: 28 U/L (ref 15–41)
Albumin: 2.7 g/dL — ABNORMAL LOW (ref 3.5–5.0)
Albumin: <1.5 g/dL — ABNORMAL LOW (ref 3.5–5.0)
Alkaline Phosphatase: 23 U/L — ABNORMAL LOW (ref 38–126)
Alkaline Phosphatase: 46 U/L (ref 38–126)
Anion gap: 7 (ref 5–15)
Anion gap: 8 (ref 5–15)
BUN: 6 mg/dL — ABNORMAL LOW (ref 8–23)
BUN: 9 mg/dL (ref 8–23)
CO2: 14 mmol/L — ABNORMAL LOW (ref 22–32)
CO2: 23 mmol/L (ref 22–32)
Calcium: 4.6 mg/dL — CL (ref 8.9–10.3)
Calcium: 8.6 mg/dL — ABNORMAL LOW (ref 8.9–10.3)
Chloride: 110 mmol/L (ref 98–111)
Chloride: 124 mmol/L — ABNORMAL HIGH (ref 98–111)
Creatinine, Ser: 0.65 mg/dL (ref 0.61–1.24)
Creatinine, Ser: 1.19 mg/dL (ref 0.61–1.24)
GFR, Estimated: >60 mL/min (ref >60)
GFR, Estimated: >60 mL/min (ref >60)
Glucose, Bld: 129 mg/dL — ABNORMAL HIGH (ref 70–99)
Glucose, Bld: 179 mg/dL — ABNORMAL HIGH (ref 70–99)
Potassium: 3.2 mmol/L — ABNORMAL LOW (ref 3.5–5.1)
Potassium: 5 mmol/L (ref 3.5–5.1)
Sodium: 141 mmol/L (ref 135–145)
Sodium: 145 mmol/L (ref 135–145)
Total Bilirubin: 0.7 mg/dL (ref 0.0–1.2)
Total Bilirubin: 1.2 mg/dL (ref 0.0–1.2)
Total Protein: 3.6 g/dL — ABNORMAL LOW (ref 6.5–8.1)
Total Protein: 6.3 g/dL — ABNORMAL LOW (ref 6.5–8.1)

## 2024-02-07 LAB — LACTIC ACID, PLASMA: Lactic Acid, Venous: 2.3 mmol/L (ref 0.5–1.9)

## 2024-02-07 LAB — GLUCOSE, CAPILLARY
Glucose-Capillary: 165 mg/dL — ABNORMAL HIGH (ref 70–99)
Glucose-Capillary: 152 mg/dL — ABNORMAL HIGH (ref 70–99)

## 2024-02-07 LAB — HEMOGLOBIN A1C
Hgb A1c MFr Bld: 6.3 % — ABNORMAL HIGH (ref 4.8–5.6)
Mean Plasma Glucose: 134.11 mg/dL

## 2024-02-07 LAB — MAGNESIUM
Magnesium: 1.2 mg/dL — ABNORMAL LOW (ref 1.7–2.4)
Magnesium: 2.3 mg/dL (ref 1.7–2.4)

## 2024-02-07 LAB — MRSA NEXT GEN BY PCR, NASAL: MRSA by PCR Next Gen: NOT DETECTED

## 2024-02-07 MED ORDER — SODIUM CHLORIDE 0.9 % IV SOLN
3.0000 g | Freq: Four times a day (QID) | INTRAVENOUS | Status: DC
  Administered 2024-02-07 – 2024-02-08 (×5): 3 g via INTRAVENOUS
  Filled 2024-02-07 (×5): qty 8

## 2024-02-07 MED ORDER — ROSUVASTATIN CALCIUM 20 MG PO TABS
20.0000 mg | ORAL_TABLET | Freq: Every day | ORAL | Status: DC
  Administered 2024-02-07 – 2024-02-12 (×5): 20 mg via ORAL
  Filled 2024-02-07 (×7): qty 1

## 2024-02-07 MED ORDER — APIXABAN 2.5 MG PO TABS
2.5000 mg | ORAL_TABLET | Freq: Two times a day (BID) | ORAL | Status: DC
  Administered 2024-02-07 – 2024-02-09 (×6): 2.5 mg via ORAL
  Filled 2024-02-07 (×7): qty 1

## 2024-02-07 NOTE — Inpatient Diabetes Management (Signed)
 Inpatient Diabetes Program Recommendations  AACE/ADA: New Consensus Statement on Inpatient Glycemic Control   Target Ranges:  Prepandial:   less than 140 mg/dL      Peak postprandial:   less than 180 mg/dL (1-2 hours)      Critically ill patients:  140 - 180 mg/dL    Latest Reference Range & Units 02/06/24 20:32 02/06/24 23:00 02/07/24 03:37  Glucose-Capillary 70 - 99 mg/dL 837 (H) 829 (H) 834 (H)    Latest Reference Range & Units 02/07/24 00:05 02/07/24 02:30  Glucose 70 - 99 mg/dL 870 (H) 820 (H)   Review of Glycemic Control  Diabetes history: DM2 Outpatient Diabetes medications: Jardiance  25 mg daily, Metformin  XR 500 mg QAM, FreeStyle Libre 3 Current orders for Inpatient glycemic control: None  Inpatient Diabetes Program Recommendations:    Insulin : Please consider ordering CBGs AC&HS with Novolog  0-9 units TID with meals and Novolog  0-5 units at bedtime.  Thanks, Earnie Gainer, RN, MSN, CDCES Diabetes Coordinator Inpatient Diabetes Program [phone] (Team Pager from 8am to 5pm)

## 2024-02-07 NOTE — Progress Notes (Signed)
 Echocardiogram 2D Echocardiogram has been performed.  Rick Schwartz 02/07/2024, 11:48 AM

## 2024-02-07 NOTE — Evaluation (Signed)
 Clinical/Bedside Swallow Evaluation Patient Details  Name: Rick Schwartz MRN: [id number] Date of Birth: September 29, 1940  Today's Date: 02/07/2024 Time: SLP Start Time (ACUTE ONLY): 0830 SLP Stop Time (ACUTE ONLY): 0850 SLP Time Calculation (min) (ACUTE ONLY): 20 min  Past Medical History:  Past Medical History:  Diagnosis Date   Acute renal failure superimposed on stage 3a chronic kidney disease (HCC) 06/13/2022   Acute respiratory failure with hypoxemia (HCC)    Aspiration pneumonia (HCC) 01/20/2021   Bradycardia 02/08/2021   Cancer (HCC)    Prostate   Cerebral palsy (HCC)    Community acquired pneumonia    Cough 01/21/2012   COVID-19 11/22/2020   Diabetes mellitus without complication (HCC) 11/15/2022   Hypercalcemia 07/18/2020   Hyperlipidemia    Hypernatremia 07/18/2020   Hypertension    Peripheral arterial disease    critical limb ischemia   SBO (small bowel obstruction) (HCC)    Sepsis (HCC) 07/18/2020   Underweight (BMI < 18.5) 01/23/2023   Past Surgical History:  Past Surgical History:  Procedure Laterality Date   AMPUTATION Right 07/22/2020   Procedure: RIGHT ABOVE KNEE AMPUTATION;  Surgeon: Harden Jerona GAILS, MD;  Location: [facility] OR;  Service: Orthopedics;  Laterality: Right;   PROSTATE SURGERY     HPI:  Rick Schwartz is an 83yo M with PMH spastic quadriplegic cerebral palsy, HTN, BPH, protein calorie malnutrition who presented via EMS with ?1 day h/o shortness of breath on 02/06/24.  Multifocal opacifications on CT chest with narrowing of RLL bronchus possibly containing aspirate material. Chart review revealed reported change in diet about 6 months ago from pureed to regular, with phone call to sister confirming pt consumes soft consistencies. MBS completed on 11/14/22 with pt exhibiting severe dysphagia, was sent home with home health speech therapy with Dysphagia 1/nectar-thickened liquids.  BSE completed prior hospitalization on 01/23/23 recommending Dysphagia 1/thin liquids.   PMHx significant for prostate cancer, h/o critical limb ischemia s/p R AKA, T2DM and dementia.  ST re-consulted for clinical swallow evaluation.   Assessment / Plan / Recommendation  Clinical Impression  Recommend Dysphagia 1(puree)/thin liquids via small sips. Medications crushed in puree. May consider a repeat objective assessment (MBS) prn. Consider palliative consult re: GOC.  ST will f/u in acute setting for dysphagia tx/management.    Pt seen for clinical swallow evaluation with pt cooperative, but requiring verbal cues for swallow initiation and consuming small sips during evaluation.  Pt provided oral care prior to assessment with xerostomia noted and dried secretions on lingual surface.  Pt administered ice chips, thin via tsp/cup and puree consistency with edentulous state observed/generalized oral weakness.  Partial OME completed, but pt unable to complete some oral commands d/t decreased cognitive status.  CP impacting oral/motor movements with slow, prolonged oral preparation/propulsion and a delay in the initiation of the swallow observed during consumption of all consistencies.  No overt s/s of aspiration observed during po trial, but pt is at risk d/t Pmhx and cognitive impairment/deconditioning.  Swallow precaution sign posted in room and nursing informed of diet progression/precautions to utilize during feeding.  FULL supervision/feeding during meals with swallow precautions in place. ST will continue to f/u for dysphagia tx/management in acute setting.  Thank you for this consult.  Pt assessed via clinical swallow evaluation with    SLP Visit Diagnosis: Dysphagia, unspecified (R13.10)    Aspiration Risk  Mild aspiration risk;Moderate aspiration risk    Diet Recommendation   Thin;Dysphagia 1 (puree)  Medication Administration: Crushed with puree  Other  Recommendations Oral Care Recommendations: Oral care BID;Staff/trained caregiver to provide oral care Caregiver  Recommendations: Avoid jello, ice cream, thin soups, popsicles     Assistance Recommended at Discharge  FULL  Functional Status Assessment Patient has had a recent decline in their functional status and demonstrates the ability to make significant improvements in function in a reasonable and predictable amount of time.  Frequency and Duration min 1 x/week  1 week       Prognosis Prognosis for improved oropharyngeal function: Good Barriers to Reach Goals: Cognitive deficits      Swallow Study   General HPI: [name] is a 83 y.o. male presenting with shortness of breath likely secondary to aspiration pneumonia due to patient not following pure diet, PE less likely as CT has ruled out, can consider worsening CHF exacerbation with elevated BNP. Will get repeat echo to assess cardiac function.  01/23/24 BSE completed prior on 01/23/24 recommending Dysphagia 1/thin liquids.  ST re-consulted for clinical swallow evaluation. Diet Prior to this Study: NPO Temperature Spikes Noted: No Respiratory Status: Nasal cannula;Other (comment) (Bipap intermittently) History of Recent Intubation: No Behavior/Cognition: Alert;Cooperative;Requires cueing Oral Cavity Assessment: Dry Oral Care Completed by SLP: Yes Oral Cavity - Dentition: Edentulous Self-Feeding Abilities: Needs assist Patient Positioning: Upright in bed Baseline Vocal Quality: Low vocal intensity Volitional Cough: Weak Volitional Swallow: Able to elicit    Oral/Motor/Sensory Function Overall Oral Motor/Sensory Function: Mild impairment   Ice Chips Ice chips: Impaired Presentation: Spoon Oral Phase Impairments: Impaired mastication;Reduced lingual movement/coordination Oral Phase Functional Implications: Prolonged oral transit;Oral holding Pharyngeal Phase Impairments: Suspected delayed Swallow   Thin Liquid Thin Liquid: Impaired Presentation: Cup;Spoon Oral Phase Functional Implications: Oral holding Pharyngeal  Phase  Impairments: Suspected delayed Swallow    Nectar Thick Nectar Thick Liquid: Not tested   Honey Thick Honey Thick Liquid: Not tested   Puree Puree: Impaired Presentation: Spoon Oral Phase Impairments: Impaired mastication;Reduced lingual movement/coordination Oral Phase Functional Implications: Oral holding;Prolonged oral transit Pharyngeal Phase Impairments: Suspected delayed Swallow   Solid     Solid: Not tested      Pat Jadian Karman,M.S.,CCC-SLP 02/07/2024,9:07 AM

## 2024-02-07 NOTE — Progress Notes (Signed)
 PHARMACY - PHYSICIAN COMMUNICATION CRITICAL VALUE ALERT - BLOOD CULTURE IDENTIFICATION (BCID)  Rick Schwartz is an 83 y.o. male who presented to [facility] on 02/06/2024 with a chief complaint of SOB  Assessment:  blood cultures with GPC in 1/4 bottles. BCID with staph species. Likely contaminant   Name of physician (or Provider) Contacted: S. Majeed  Current antibiotics: Unasyn   Changes to prescribed antibiotics recommended:  Patient is on recommended antibiotics - No changes needed  Results for orders placed or performed during the hospital encounter of 02/06/24  Blood Culture ID Panel (Reflexed) (Collected: 02/06/2024  3:50 PM)  Result Value Ref Range   Enterococcus faecalis NOT DETECTED NOT DETECTED   Enterococcus Faecium NOT DETECTED NOT DETECTED   Listeria monocytogenes NOT DETECTED NOT DETECTED   Staphylococcus species DETECTED (A) NOT DETECTED   Staphylococcus aureus (BCID) NOT DETECTED NOT DETECTED   Staphylococcus epidermidis NOT DETECTED NOT DETECTED   Staphylococcus lugdunensis NOT DETECTED NOT DETECTED   Streptococcus species NOT DETECTED NOT DETECTED   Streptococcus agalactiae NOT DETECTED NOT DETECTED   Streptococcus pneumoniae NOT DETECTED NOT DETECTED   Streptococcus pyogenes NOT DETECTED NOT DETECTED   A.calcoaceticus-baumannii NOT DETECTED NOT DETECTED   Bacteroides fragilis NOT DETECTED NOT DETECTED   Enterobacterales NOT DETECTED NOT DETECTED   Enterobacter cloacae complex NOT DETECTED NOT DETECTED   Escherichia coli NOT DETECTED NOT DETECTED   Klebsiella aerogenes NOT DETECTED NOT DETECTED   Klebsiella oxytoca NOT DETECTED NOT DETECTED   Klebsiella pneumoniae NOT DETECTED NOT DETECTED   Proteus species NOT DETECTED NOT DETECTED   Salmonella species NOT DETECTED NOT DETECTED   Serratia marcescens NOT DETECTED NOT DETECTED   Haemophilus influenzae NOT DETECTED NOT DETECTED   Neisseria meningitidis NOT DETECTED NOT DETECTED   Pseudomonas aeruginosa NOT  DETECTED NOT DETECTED   Stenotrophomonas maltophilia NOT DETECTED NOT DETECTED   Candida albicans NOT DETECTED NOT DETECTED   Candida auris NOT DETECTED NOT DETECTED   Candida glabrata NOT DETECTED NOT DETECTED   Candida krusei NOT DETECTED NOT DETECTED   Candida parapsilosis NOT DETECTED NOT DETECTED   Candida tropicalis NOT DETECTED NOT DETECTED   Cryptococcus neoformans/gattii NOT DETECTED NOT DETECTED   Prentice Poisson, PharmD Clinical Pharmacist **Pharmacist phone directory can now be found on [email or web address] ([email or web address]).  Listed under [facility].

## 2024-02-07 NOTE — Progress Notes (Signed)
 Patient off the BIPAP at this time and placed on 4L [place], tolerating well with stable vitals. BIPAP on standby at bedside.

## 2024-02-07 NOTE — Assessment & Plan Note (Addendum)
 Last A1c 5.7 4 mo ago, stable at 6.3 now. Home meds: metformin  500 mg daily, Jardiance  25 mg daily 6.3 today. - add AC QS monitoring he has a diet  - daily CBG

## 2024-02-07 NOTE — Assessment & Plan Note (Signed)
 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

## 2024-02-07 NOTE — Progress Notes (Signed)
 Daily Progress Note Intern Pager: [phone]  Patient name: BYAN POPLASKI Medical record number: [id number] Date of birth: May 31, 1940 Age: 83 y.o. Gender: male  Primary Care Provider: Romelle Booty, MD Consultants: None Code Status: DNR-limited  Pt Overview and Major Events to Date:  11/20: Admitted for aspiration pneumonia  Assessment and Plan:  NOEH SPARACINO is a 83 y.o. male w/PMHx of spastic quadriplegic cerebral palsy, HTN, BPH, protein calorie malnutrition, h/o prostate cancer, h/o critical limb ischemia s/p R AKA, T2DM, dementia, HFrEF  presenting with shortness of breath secondary to aspiration pneumonia from regular diet instead of pureed.  Currently off BiPAP and doing much better than he was on admission. Echo pending. NPO until SLP eval, in process this AM. Currently DNR/DNI but can consider additional GOC conversations as needed for quality of life. Assessment & Plan Aspiration pneumonia (HCC) BiPAP tolerated well overnight, able to wean off at 0830 this morning. Watch through today to see if able to downgrade status tomorrow. - Continue mIVF at 125mL/hr with significant dysphagia - Trend lactic acid until normal - today decreased to 2.3 - Goal MAP >65 - Antibiotics, 7 day course: Zosyn  (11/20) 7 days, switch to Unasyn  today and then to oral Augmentin  stepwise - MRSA swab negative, Strep pneumo negative - Pain control: Tylenol  650 mg q6h PRN  - Urine Legionella pending  - Sputum culture pending - Fall precautions - PT/OT consult now that he is off BiPAP - SLP consult  - Restart home Eliquis  2.5mg  BID - Foley placed yesterday; void trial today with post void bladder scan later today around 3pm  T2DM (type 2 diabetes mellitus) (HCC) Last A1c 5.7 4 mo ago, stable at 6.3 now. Home meds: metformin  500 mg daily, Jardiance  25 mg daily 6.3 today. - add AC QS monitoring he has a diet  - daily CBG Chronic health problem A fib: hold Eliquis , restart once off BiPAP HLD:  Crestor  20 mg daily  HFrEF EF40-45%: repeat echo   FEN/GI: NPO until SLP clears PPx: Eliquis  2.5mg  BID Dispo:Home pending clinical improvement .   Subjective:  Patient was seen and examined at bedside. He states he is feeling much better than admission and tolerated BiPAP fine. He has no complaints this morning.     Objective: Temp:  [97.4 F (36.3 C)-98.7 F (37.1 C)] 97.8 F (36.6 C) (11/21 0300) Pulse Rate:  [74-115] 75 (11/21 0300) Resp:  [16-41] 22 (11/21 0300) BP: (109-140)/(66-100) 109/66 (11/21 0300) SpO2:  [97 %-100 %] 100 % (11/21 0300) FiO2 (%):  [40 %] 40 % (11/21 0100) Weight:  [49.9 kg-60.6 kg] 60.6 kg (11/21 0300) Physical Exam: General: chronically ill appearing male sitting upright in hospital bed in no acute distress on 4L [place] Cardiovascular: RRR, no m/r/r Respiratory: Diminished breath sounds 2/2 poor respiratory effort and maybe some coarse expiratory wheezes, but not obvious w/r/r Abdomen: soft, non-tender, non-distended  Extremities: R AKA, no peripheral edema  Laboratory: Most recent CBC Lab Results  Component Value Date   WBC 8.4 02/07/2024   HGB 13.9 02/07/2024   HCT 46.0 02/07/2024   MCV 83.2 02/07/2024   PLT 222 02/07/2024   Most recent BMP    Latest Ref Rng & Units 02/07/2024    2:30 AM  BMP  Glucose 70 - 99 mg/dL 820   BUN 8 - 23 mg/dL 9   Creatinine 9.38 - 8.75 mg/dL 8.80   Sodium 864 - 854 mmol/L 141   Potassium 3.5 - 5.1 mmol/L  5.0   Chloride 98 - 111 mmol/L 110   CO2 22 - 32 mmol/L 23   Calcium  8.9 - 10.3 mg/dL 8.6    Lupie Credit, DO 02/07/2024, 7:11 AM  PGY-1, [facility] FPTS Intern pager: [phone], text pages welcome Secure chat group [facility] Teaching Service

## 2024-02-07 NOTE — Assessment & Plan Note (Addendum)
 BiPAP tolerated well overnight, able to wean off at 0830 this morning. Watch through today to see if able to downgrade status tomorrow. - Continue mIVF at 125mL/hr with significant dysphagia - Trend lactic acid until normal - today decreased to 2.3 - Goal MAP >65 - Antibiotics, 7 day course: Zosyn  (11/20) 7 days, switch to Unasyn  today and then to oral Augmentin  stepwise - MRSA swab negative, Strep pneumo negative - Pain control: Tylenol  650 mg q6h PRN  - Urine Legionella pending  - Sputum culture pending - Fall precautions - PT/OT consult now that he is off BiPAP - SLP consult  - Restart home Eliquis  2.5mg  BID - Foley placed yesterday; void trial today with post void bladder scan later today around 3pm

## 2024-02-08 DIAGNOSIS — I5022 Chronic systolic (congestive) heart failure: Secondary | ICD-10-CM

## 2024-02-08 DIAGNOSIS — E1142 Type 2 diabetes mellitus with diabetic polyneuropathy: Secondary | ICD-10-CM | POA: Diagnosis not present

## 2024-02-08 DIAGNOSIS — E872 Acidosis, unspecified: Secondary | ICD-10-CM | POA: Diagnosis not present

## 2024-02-08 DIAGNOSIS — J9601 Acute respiratory failure with hypoxia: Secondary | ICD-10-CM

## 2024-02-08 DIAGNOSIS — J690 Pneumonitis due to inhalation of food and vomit: Secondary | ICD-10-CM | POA: Diagnosis not present

## 2024-02-08 LAB — BASIC METABOLIC PANEL WITH GFR
Anion gap: 9 (ref 5–15)
BUN: 13 mg/dL (ref 8–23)
CO2: 22 mmol/L (ref 22–32)
Calcium: 9 mg/dL (ref 8.9–10.3)
Chloride: 115 mmol/L — ABNORMAL HIGH (ref 98–111)
Creatinine, Ser: 1.13 mg/dL (ref 0.61–1.24)
GFR, Estimated: >60 mL/min (ref >60)
Glucose, Bld: 157 mg/dL — ABNORMAL HIGH (ref 70–99)
Potassium: 4.3 mmol/L (ref 3.5–5.1)
Sodium: 146 mmol/L — ABNORMAL HIGH (ref 135–145)

## 2024-02-08 LAB — CALCIUM, IONIZED: Calcium, Ionized, Serum: 4.6 mg/dL (ref 4.5–5.6)

## 2024-02-08 LAB — PARATHYROID HORMONE, INTACT (NO CA): PTH: 87 pg/mL — ABNORMAL HIGH (ref 15–65)

## 2024-02-08 LAB — MAGNESIUM: Magnesium: 2.2 mg/dL (ref 1.7–2.4)

## 2024-02-08 MED ORDER — AMOXICILLIN-POT CLAVULANATE 875-125 MG PO TABS
1.0000 | ORAL_TABLET | Freq: Two times a day (BID) | ORAL | Status: DC
Start: 2024-02-09 — End: 2024-02-14
  Administered 2024-02-09 (×2): 1 via ORAL
  Filled 2024-02-08 (×3): qty 1

## 2024-02-08 MED ORDER — SODIUM CHLORIDE 0.9 % IV SOLN
3.0000 g | Freq: Four times a day (QID) | INTRAVENOUS | Status: AC
  Administered 2024-02-08: 3 g via INTRAVENOUS
  Filled 2024-02-08: qty 8

## 2024-02-08 MED ORDER — INFLUENZA VAC SPLIT HIGH-DOSE 0.5 ML IM SUSY
0.5000 mL | PREFILLED_SYRINGE | INTRAMUSCULAR | Status: DC | PRN
Start: 2024-02-08 — End: 2024-02-12

## 2024-02-08 NOTE — Progress Notes (Signed)
 Daily Progress Note Intern Pager: [phone]  Patient name: Rick Schwartz Medical record number: [id number] Date of birth: 03-03-1941 Age: 83 y.o. Gender: male  Primary Care Provider: Romelle Booty, MD Consultants: None Code Status: DNR-Limited  Pt Overview and Major Events to Date:  11/20 admitted for aspiration pneumonia  Medical Decision Making:  Rick Schwartz is a 83 y.o. male presenting with shortness of breath in the setting of likely aspiration pneumonia due to incorrect diet at home.   Pertinent PMH/PSH includes spastic quadriplegic cerebral palsy, HTN, BPH, protein calorie malnutrition, history of prostate cancer, history of critical limb ischemia s/p right AKA, T2DM, dementia, HFrEF.  Assessment & Plan Aspiration pneumonia (HCC) Stable, improving, off BiPAP and fluids.  Lactate improved. Passed void trial, foley removed. - Goal MAP >65 - Antibiotics, 7 day course: Unasyn  and then to oral Augmentin  stepwise - MRSA swab negative, Strep pneumo negative - Pain control: Tylenol  650 mg q6h PRN  - Urine Legionella pending  - Sputum culture pending - Fall precautions - PT/OT consult now that he is off BiPAP - SLP consult  Chronic HFrEF (heart failure with reduced ejection fraction) (HCC) Echo this admission with EF 25-30%, worsened compared to 2024 when EF was 45-50%. On admission he initially had elevated BNP and some pulmonary edema on CXR however clinically did not appear hypervolemic and repeat CXR 11/21 showed improvement without diuresis. His BP has been relatively soft and in the past has had difficulty tolerating GDMT due to this. -Consult cardiology, appreciate recs for worsening CHF T2DM (type 2 diabetes mellitus) (HCC) Last A1c 5.7 4 mo ago, stable at 6.3 now. Home meds: metformin  500 mg daily, Jardiance  25 mg daily 6.3 today. Blood glucose stable. -Monitor glucose on BMP Chronic health problem A fib: Eliquis  restarted HLD: Crestor  20 mg daily    FEN/GI:  DYS 1 per SLP PPx: eliquis  Dispo:Pending PT recommendations  pending clinical improvement . Barriers include IV abx, monitoring resp status, and PT/OT eval.   Subjective:  NAEON, remained off bipap. Denies concerns today  Objective: Temp:  [97.8 F (36.6 C)-98.6 F (37 C)] 98.2 F (36.8 C) (11/21 2300) Pulse Rate:  [68-90] 68 (11/21 2300) Resp:  [10-22] 14 (11/21 2300) BP: (99-129)/(63-89) 119/74 (11/21 2300) SpO2:  [96 %-100 %] 96 % (11/21 2300) FiO2 (%):  [40 %] 40 % (11/21 0100) Weight:  [60.6 kg] 60.6 kg (11/21 0300) Physical Exam: General: NAD, sleeping comfortably, awakens to voice Cardiovascular: irregularly irregular no mrg Respiratory: faint rhonchi b/l Abdomen: soft non distended Extremities: R AKA, RUE contracture. No significant peripheral edema  Laboratory: Most recent CBC Lab Results  Component Value Date   WBC 8.4 02/07/2024   HGB 13.9 02/07/2024   HCT 46.0 02/07/2024   MCV 83.2 02/07/2024   PLT 222 02/07/2024   Most recent BMP    Latest Ref Rng & Units 02/07/2024    2:30 AM  BMP  Glucose 70 - 99 mg/dL 820   BUN 8 - 23 mg/dL 9   Creatinine 9.38 - 8.75 mg/dL 8.80   Sodium 864 - 854 mmol/L 141   Potassium 3.5 - 5.1 mmol/L 5.0   Chloride 98 - 111 mmol/L 110   CO2 22 - 32 mmol/L 23   Calcium  8.9 - 10.3 mg/dL 8.6     Mg 2.2  Imaging/Diagnostic Tests:  TTE 02/07/24: IMPRESSIONS     1. Left ventricular ejection fraction, by estimation, is 25 to 30%. The  left ventricle has severely  decreased function. The left ventricle  demonstrates global hypokinesis. The left ventricular internal cavity size  was mildly dilated. Left ventricular  diastolic parameters were normal.   2. Right ventricular systolic function is normal. The right ventricular  size is normal.   3. Left atrial size was moderately dilated.   4. The mitral valve is abnormal. Mild mitral valve regurgitation. No  evidence of mitral stenosis.   5. The aortic valve is tricuspid. There  is mild calcification of the  aortic valve. There is mild thickening of the aortic valve. Aortic valve  regurgitation is not visualized. Aortic valve sclerosis/calcification is  present, without any evidence of  aortic stenosis.   6. The inferior vena cava is normal in size with greater than 50%  respiratory variability, suggesting right atrial pressure of 3 mmHg.  Maude Emmer MD  Electronically signed by Maude Emmer MD  Signature Date/Time: 02/07/2024/4:14:29 PM      CXR:  IMPRESSION: 1. Patient's arm overlies the right lower chest obscuring evaluation of this area. The lungs otherwise appear clear. 2. Mild cardiomegaly.     Electronically Signed   By: Greig Pique M.D.   On: 02/07/2024 20:30  Romelle Booty, MD 02/08/2024, 12:13 AM  PGY-3, [facility] FPTS Intern pager: [phone], text pages welcome Secure chat group [facility] Teaching Service

## 2024-02-08 NOTE — Assessment & Plan Note (Addendum)
 Echo this admission with EF 25-30%, worsened compared to 2024 when EF was 45-50%. On admission he initially had elevated BNP and some pulmonary edema on CXR however clinically did not appear hypervolemic and repeat CXR 11/21 showed improvement without diuresis. His BP has been relatively soft and in the past has had difficulty tolerating GDMT due to this. -Consult cardiology, appreciate recs for worsening CHF

## 2024-02-08 NOTE — Assessment & Plan Note (Signed)
 Last A1c 5.7 4 mo ago, stable at 6.3 now. Home meds: metformin  500 mg daily, Jardiance  25 mg daily 6.3 today. Blood glucose stable. -Monitor glucose on BMP

## 2024-02-08 NOTE — Progress Notes (Signed)
 OT Cancellation Note- OT Screen  Patient Details Name: Rick Schwartz MRN: [id number] DOB: 1940/12/29   Cancelled Treatment:    Reason Eval/Treat Not Completed: OT screened, no needs identified, will sign off (OT screen completed. At baseline, pt largely bedbound and requiring 24/7 total care and hoyer lift for functional transfers. No skilled OT or DME needs identified at this time. OT signing off.)  Margarie Rockey HERO., OTR/L, MA Acute Rehab [phone]   Margarie FORBES Horns 02/08/2024, 3:13 PM

## 2024-02-08 NOTE — Assessment & Plan Note (Signed)
 Stable, improving, off BiPAP and fluids.  Lactate improved. Passed void trial, foley removed. - Goal MAP >65 - Antibiotics, 7 day course: Unasyn  and then to oral Augmentin  stepwise - MRSA swab negative, Strep pneumo negative - Pain control: Tylenol  650 mg q6h PRN  - Urine Legionella pending  - Sputum culture pending - Fall precautions - PT/OT consult now that he is off BiPAP - SLP consult

## 2024-02-08 NOTE — Progress Notes (Signed)
   02/08/24 0300  BiPAP/CPAP/SIPAP  Reason BIPAP/CPAP not in use Other(comment) (BIPAP not needed at this time, on standby)

## 2024-02-08 NOTE — Evaluation (Signed)
 Physical Therapy Evaluation Patient Details Name: Rick Schwartz MRN: [id number] DOB: [birth year] Today's Date: 02/08/2024  History of Present Illness  Pt is an 83 y.o. male admitted 11/20 with aspiration pneumonia. PMH:  spastic quadriplegic cerebral palsy, HTN, BPH, protein calorie malnutrition, h/o prostate cancer, h/o critical limb ischemia s/p R AKA, T2DM, dementia, HFrEF  Clinical Impression  PT eval complete. Pt is total care at baseline, primarily bedbound requiring hoyer lift for transfers. RUE contractures and RLE AKA. Limited AAROM LLE. AROM LUE WFL. Pt required total assist rolling R/L.  Pt has 24-hour assist from sister and caregiver. No skilled PT intervention indicated. No DME needs. PT signing off.       If plan is discharge home, recommend the following:     Can travel by private vehicle        Equipment Recommendations None recommended by PT  Recommendations for Other Services       Functional Status Assessment Patient has not had a recent decline in their functional status     Precautions / Restrictions Precautions Precautions: Fall Recall of Precautions/Restrictions: Intact      Mobility  Bed Mobility Overal bed mobility: Needs Assistance Bed Mobility: Rolling Rolling: Total assist         General bed mobility comments: transitioned from R sidelying to L sidelying    Transfers                   General transfer comment: hoyer lift at baseline    Ambulation/Gait               General Gait Details: nonambulatory at baseline  Stairs            Wheelchair Mobility     Tilt Bed    Modified Rankin (Stroke Patients Only)       Balance                                             Pertinent Vitals/Pain Pain Assessment Pain Assessment: No/denies pain    Home Living Family/patient expects to be discharged to:: Private residence Living Arrangements: Other relatives (sister) Available Help at  Discharge: Family;Personal care attendant;Available 24 hours/day Type of Home: House Home Access: Level entry       Home Layout: One level Home Equipment: Wheelchair - manual;Hospital bed;BSC/3in1;Tub bench;Other (comment) (hoyer lift)      Prior Function Prior Level of Function : Needs assist             Mobility Comments: primarily bedbound. Transfers via hoyer lift to recliner vs w/c. ADLs Comments: total care     Extremity/Trunk Assessment   Upper Extremity Assessment Upper Extremity Assessment: RUE deficits/detail;LUE deficits/detail RUE Deficits / Details: flexion contractures LUE Deficits / Details: A/AAROM WFL, grossly 3/5    Lower Extremity Assessment Lower Extremity Assessment: RLE deficits/detail;LLE deficits/detail RLE Deficits / Details: s/p AKA LLE Deficits / Details: no active movement noted. Fixed ankle. Able to move knee through approx 30-40 degree arc       Communication   Communication Communication: Impaired Factors Affecting Communication: Reduced clarity of speech    Cognition Arousal: Alert Behavior During Therapy: WFL for tasks assessed/performed, Flat affect   PT - Cognitive impairments: Difficult to assess Difficult to assess due to: Impaired communication  PT - Cognition Comments: speech difficult to understand. Appears to be answering questions appropriately. Following commands: Intact       Cueing Cueing Techniques: Verbal cues, Tactile cues     General Comments General comments (skin integrity, edema, etc.): VSS on 4L    Exercises     Assessment/Plan    PT Assessment Patient does not need any further PT services  PT Problem List         PT Treatment Interventions      PT Goals (Current goals can be found in the Care Plan section)  Acute Rehab PT Goals Patient Stated Goal: not stated PT Goal Formulation: All assessment and education complete, DC therapy    Frequency        Co-evaluation               AM-PAC PT 6 Clicks Mobility  Outcome Measure Help needed turning from your back to your side while in a flat bed without using bedrails?: Total Help needed moving from lying on your back to sitting on the side of a flat bed without using bedrails?: Total Help needed moving to and from a bed to a chair (including a wheelchair)?: Total Help needed standing up from a chair using your arms (e.g., wheelchair or bedside chair)?: Total Help needed to walk in hospital room?: Total Help needed climbing 3-5 steps with a railing? : Total 6 Click Score: 6    End of Session   Activity Tolerance: Patient tolerated treatment well Patient left: in bed Nurse Communication: Need for lift equipment PT Visit Diagnosis: Other abnormalities of gait and mobility (R26.89)    Time: 9244-9189 PT Time Calculation (min) (ACUTE ONLY): 15 min   Charges:   PT Evaluation $PT Eval Low Complexity: 1 Low   PT General Charges $$ ACUTE PT VISIT: 1 Visit         Sari MATSU., PT  Office # [phone]   Erven Sari Shaker 02/08/2024, 8:20 AM

## 2024-02-08 NOTE — Plan of Care (Signed)
 Spoke with on call cardiologist. Currently recommending outpatient cardiology follow up for reduced EF from prior study. With patient's poor functional status and quality of life, he would not be a candidate for further invasive workup for reduced EF.   Will add on GDMT as BP, kidney function and HR tolerate.   Ref to cardiology placed.  Damien Pinal, DO [facility], PGY-3 02/08/24 1:38 PM

## 2024-02-08 NOTE — Assessment & Plan Note (Signed)
 A fib: Eliquis  restarted HLD: Crestor  20 mg daily

## 2024-02-09 DIAGNOSIS — J690 Pneumonitis due to inhalation of food and vomit: Secondary | ICD-10-CM | POA: Diagnosis not present

## 2024-02-09 DIAGNOSIS — I5022 Chronic systolic (congestive) heart failure: Secondary | ICD-10-CM | POA: Diagnosis not present

## 2024-02-09 DIAGNOSIS — E1142 Type 2 diabetes mellitus with diabetic polyneuropathy: Secondary | ICD-10-CM | POA: Diagnosis not present

## 2024-02-09 LAB — CULTURE, BLOOD (ROUTINE X 2)

## 2024-02-09 LAB — BASIC METABOLIC PANEL WITH GFR
Anion gap: 6 (ref 5–15)
BUN: 13 mg/dL (ref 8–23)
CO2: 29 mmol/L (ref 22–32)
Calcium: 8.9 mg/dL (ref 8.9–10.3)
Chloride: 115 mmol/L — ABNORMAL HIGH (ref 98–111)
Creatinine, Ser: 1.08 mg/dL (ref 0.61–1.24)
GFR, Estimated: >60 mL/min (ref >60)
Glucose, Bld: 104 mg/dL — ABNORMAL HIGH (ref 70–99)
Potassium: 4 mmol/L (ref 3.5–5.1)
Sodium: 150 mmol/L — ABNORMAL HIGH (ref 135–145)

## 2024-02-09 LAB — CBC
HCT: 42.8 % (ref 39.0–52.0)
Hemoglobin: 12.5 g/dL — ABNORMAL LOW (ref 13.0–17.0)
MCH: 24.8 pg — ABNORMAL LOW (ref 26.0–34.0)
MCHC: 29.2 g/dL — ABNORMAL LOW (ref 30.0–36.0)
MCV: 84.8 fL (ref 80.0–100.0)
Platelets: 214 K/uL (ref 150–400)
RBC: 5.05 MIL/uL (ref 4.22–5.81)
RDW: 17.7 % — ABNORMAL HIGH (ref 11.5–15.5)
WBC: 6.9 K/uL (ref 4.0–10.5)
nRBC: 0 % (ref 0.0–0.2)

## 2024-02-09 LAB — LIPID PANEL
Cholesterol: 102 mg/dL (ref 0–200)
HDL: 42 mg/dL (ref >40)
LDL Cholesterol: 43 mg/dL (ref 0–99)
Total CHOL/HDL Ratio: 2.4 ratio
Triglycerides: 83 mg/dL (ref <150)
VLDL: 17 mg/dL (ref 0–40)

## 2024-02-09 LAB — LEGIONELLA PNEUMOPHILA SEROGP 1 UR AG: L. pneumophila Serogp 1 Ur Ag: NEGATIVE

## 2024-02-09 LAB — MAGNESIUM: Magnesium: 2.2 mg/dL (ref 1.7–2.4)

## 2024-02-09 MED ORDER — EMPAGLIFLOZIN 10 MG PO TABS
10.0000 mg | ORAL_TABLET | Freq: Every day | ORAL | Status: DC
  Administered 2024-02-09: 10 mg via ORAL
  Filled 2024-02-09 (×2): qty 1

## 2024-02-09 NOTE — Assessment & Plan Note (Addendum)
 Recent A1c levels stable.  Blood glucose stable on BMP. - Resume Jardiance  per above - CTM glucose on BMP

## 2024-02-09 NOTE — Plan of Care (Signed)
  Problem: Clinical Measurements: Goal: Respiratory complications will improve Outcome: Progressing Goal: Cardiovascular complication will be avoided Outcome: Progressing   Problem: Nutrition: Goal: Adequate nutrition will be maintained Outcome: Progressing   Problem: Elimination: Goal: Will not experience complications related to urinary retention Outcome: Progressing   Problem: Pain Managment: Goal: General experience of comfort will improve and/or be controlled Outcome: Progressing

## 2024-02-09 NOTE — Progress Notes (Signed)
     Daily Progress Note Intern Pager: [phone]  Patient name: Rick Schwartz Medical record number: [id number] Date of birth: 1940/12/03 Age: 83 y.o. Gender: male  Primary Care Provider: Romelle Booty, MD Consultants: Cardiology provided recs Code Status: DNR  Pt Overview and Major Events to Date:  11/20: Admitted for aspiration pneumonia   Medical Decision Making:  Rick Schwartz 83 y.o. M with history of spastic quadriplegic CP, HTN, protein calorie malnutrition, prostate cancer, critical limb ischemia s/p R AKA, dementia, T2DM, HFrEF admitted for likely aspiration pneumonia in the setting of incorrect diet at home. Assessment & Plan Aspiration pneumonia (HCC) Patient stable on nasal cannula this morning.  Unclear why patient was placed on BiPAP overnight.  S/p Zosyn  and Unasyn .  MRSA swab negative. - Augmentin  twice daily starting today (end 11/27) - Appreciate PT/OT recs  - SLP recommended dysphagia level 1 diet, will follow up in acute setting - Urine Legionella pending - AM BMP Chronic HFrEF (heart failure with reduced ejection fraction) (HCC) Patient with chronic HFrEF though echo during this admission with worsening EF 25 to 30%.  Per cardiology, resume GDMT as tolerated and plan for outpatient follow-up.  Patient with history of not tolerating GDMT well due to low blood pressure.  BP stable overnight.   - Resume home Jardiance  at lower dose of 10 mg per day - CTM blood pressure in room consider starting ARB if stable T2DM (type 2 diabetes mellitus) (HCC) Recent A1c levels stable.  Blood glucose stable on BMP. - Resume Jardiance  per above - CTM glucose on BMP Chronic health problem A fib: Eliquis  2.5 twice daily HLD: Crestor  20 mg daily    FEN/GI: Dysphagia 1 diet PPx: Eliquis  Dispo: Home pending clinical stability  Subjective:  Reports no pain this morning.  Eating breakfast.  Objective: Temp:  [97.7 F (36.5 C)-99.1 F (37.3 C)] 98.1 F (36.7 C) (11/23  0408) Pulse Rate:  [59-91] 65 (11/23 0408) Resp:  [12-27] 20 (11/23 0408) BP: (106-137)/(63-79) 127/75 (11/23 0408) SpO2:  [97 %-100 %] 100 % (11/23 0408) FiO2 (%):  [40 %] 40 % (11/23 0453) Weight:  [63.5 kg] 63.5 kg (11/23 0408) Physical Exam: General: No acute distress.  Eating breakfast. Cardiovascular: S1/S2.  No extra heart sounds. Respiratory: Breathing comfortably on nasal cannula.  CV T AP about upper anterior fields.  Diminished breath sounds of lower anterior fields.  No crackles or wheezes.  No respiratory distress. Abdomen: Soft, easily reducible hernia midline.  Otherwise abdomen distended, soft, nontender. Extremities: Warm, dry R stump.  Left lower extremity without significant swelling.  Laboratory: Most recent CBC Lab Results  Component Value Date   WBC 6.9 02/09/2024   HGB 12.5 (L) 02/09/2024   HCT 42.8 02/09/2024   MCV 84.8 02/09/2024   PLT 214 02/09/2024   Most recent BMP    Latest Ref Rng & Units 02/09/2024    2:09 AM  BMP  Glucose 70 - 99 mg/dL 895   BUN 8 - 23 mg/dL 13   Creatinine 9.38 - 1.24 mg/dL 8.91   Sodium 864 - 854 mmol/L 150   Potassium 3.5 - 5.1 mmol/L 4.0   Chloride 98 - 111 mmol/L 115   CO2 22 - 32 mmol/L 29   Calcium  8.9 - 10.3 mg/dL 8.9     Diona Perkins, MD 02/09/2024, 7:04 AM  PGY-2, [place] [facility] FPTS Intern pager: [phone], text pages welcome Secure chat group [facility] Teaching Service

## 2024-02-09 NOTE — Assessment & Plan Note (Addendum)
 Patient stable on nasal cannula this morning.  Unclear why patient was placed on BiPAP overnight.  S/p Zosyn  and Unasyn .  MRSA swab negative. - Augmentin  twice daily starting today (end 11/27) - Appreciate PT/OT recs  - SLP recommended dysphagia level 1 diet, will follow up in acute setting - Urine Legionella pending - AM BMP

## 2024-02-09 NOTE — Assessment & Plan Note (Addendum)
 Patient with chronic HFrEF though echo during this admission with worsening EF 25 to 30%.  Per cardiology, resume GDMT as tolerated and plan for outpatient follow-up.  Patient with history of not tolerating GDMT well due to low blood pressure.  BP stable overnight.   - Resume home Jardiance  at lower dose of 10 mg per day - CTM blood pressure in room consider starting ARB if stable

## 2024-02-09 NOTE — Assessment & Plan Note (Addendum)
 A fib: Eliquis  2.5 twice daily HLD: Crestor  20 mg daily

## 2024-02-10 ENCOUNTER — Inpatient Hospital Stay (HOSPITAL_COMMUNITY)

## 2024-02-10 DIAGNOSIS — E278 Other specified disorders of adrenal gland: Secondary | ICD-10-CM | POA: Diagnosis not present

## 2024-02-10 DIAGNOSIS — I5022 Chronic systolic (congestive) heart failure: Secondary | ICD-10-CM | POA: Diagnosis not present

## 2024-02-10 DIAGNOSIS — G800 Spastic quadriplegic cerebral palsy: Secondary | ICD-10-CM

## 2024-02-10 DIAGNOSIS — J690 Pneumonitis due to inhalation of food and vomit: Secondary | ICD-10-CM | POA: Diagnosis not present

## 2024-02-10 DIAGNOSIS — E1142 Type 2 diabetes mellitus with diabetic polyneuropathy: Secondary | ICD-10-CM | POA: Diagnosis not present

## 2024-02-10 LAB — BLOOD GAS, VENOUS
Acid-Base Excess: 4.2 mmol/L — ABNORMAL HIGH (ref 0.0–2.0)
Bicarbonate: 31.1 mmol/L — ABNORMAL HIGH (ref 20.0–28.0)
Drawn by: 73734
O2 Saturation: 31.3 %
Patient temperature: 36.8
pCO2, Ven: 55 mmHg (ref 44–60)
pH, Ven: 7.36 (ref 7.25–7.43)
pO2, Ven: <31 mmHg — CL (ref 32–45)

## 2024-02-10 LAB — BASIC METABOLIC PANEL WITH GFR
Anion gap: 8 (ref 5–15)
BUN: 14 mg/dL (ref 8–23)
CO2: 26 mmol/L (ref 22–32)
Calcium: 8.9 mg/dL (ref 8.9–10.3)
Chloride: 111 mmol/L (ref 98–111)
Creatinine, Ser: 0.86 mg/dL (ref 0.61–1.24)
GFR, Estimated: >60 mL/min (ref >60)
Glucose, Bld: 97 mg/dL (ref 70–99)
Potassium: 3.9 mmol/L (ref 3.5–5.1)
Sodium: 145 mmol/L (ref 135–145)

## 2024-02-10 MED ORDER — SODIUM CHLORIDE 0.9 % IV SOLN
3.0000 g | Freq: Four times a day (QID) | INTRAVENOUS | Status: DC
  Administered 2024-02-10 – 2024-02-11 (×4): 3 g via INTRAVENOUS
  Filled 2024-02-10 (×4): qty 8

## 2024-02-10 MED ORDER — IOHEXOL 350 MG/ML SOLN
75.0000 mL | Freq: Once | INTRAVENOUS | Status: AC | PRN
  Administered 2024-02-10: 75 mL via INTRAVENOUS

## 2024-02-10 MED ORDER — ENOXAPARIN SODIUM 60 MG/0.6ML IJ SOSY
60.0000 mg | PREFILLED_SYRINGE | Freq: Two times a day (BID) | INTRAMUSCULAR | Status: DC
  Administered 2024-02-10 – 2024-02-12 (×4): 60 mg via SUBCUTANEOUS
  Filled 2024-02-10 (×6): qty 0.6

## 2024-02-10 MED ORDER — POTASSIUM CHLORIDE 20 MEQ PO PACK
40.0000 meq | PACK | Freq: Once | ORAL | Status: AC
  Administered 2024-02-10: 40 meq via ORAL
  Filled 2024-02-10: qty 2

## 2024-02-10 NOTE — Progress Notes (Signed)
 Paged Elodie about patient having 6 beat run of SVT.

## 2024-02-10 NOTE — Progress Notes (Signed)
 Heart Failure Navigator Progress Note  Assessed for Heart & Vascular TOC clinic readiness.  Patient does not meet criteria due to he has a scheduled [facility] appointment on 02/18/2024. No HF TOC. .   Navigator will sign off at this time.   Stephane Haddock, BSN, Scientist, Clinical (histocompatibility And Immunogenetics) Only

## 2024-02-10 NOTE — TOC Initial Note (Signed)
 Transition of Care [facility]) - Initial/Assessment Note    Patient Details  Name: Rick Schwartz MRN: [id number] Date of Birth: [birth year]  [facility] CM/SW Contact:    Lauraine FORBES Saa, LCSWA Phone Number: 02/10/2024, 1:49 PM  Clinical Narrative:                  1:49 PM Per chart review, patient resides at home with relatives. Patient has HH aide services through [facility]. Patient has a PCP and insurance. Patient has SNF history with [facility].  Patient has HH history with [facility] and [facility]. Patient has DME (hoyer lift, manual wheelchair) history with [facility]. Patient's preferred pharmacy's are [facility] and [facility] [phone] [facility]. Therapy did not have discharge recommendations for patient. No TOC needs identified at this time. TOC will continue to follow.  Expected Discharge Plan: Home/Self Care Barriers to Discharge: Continued Medical Work up   Patient Goals and CMS Choice            Expected Discharge Plan and Services       Living arrangements for the past 2 months: Single Family Home                                      Prior Living Arrangements/Services Living arrangements for the past 2 months: Single Family Home Lives with:: Relatives Patient language and need for interpreter reviewed:: Yes        Need for Family Participation in Patient Care: Yes (Comment) Care giver support system in place?: Yes (comment) Current home services: DME, Homehealth aide Criminal Activity/Legal Involvement Pertinent to Current Situation/Hospitalization: No - Comment as needed  Activities of Daily Living   ADL Screening (condition at time of admission) Independently performs ADLs?: No Does the patient have a NEW difficulty with bathing/dressing/toileting/self-feeding that is expected to last >3 days?: No Does the patient have a NEW difficulty with getting in/out of bed, walking, or climbing  stairs that is expected to last >3 days?: No Does the patient have a NEW difficulty with communication that is expected to last >3 days?: No Is the patient deaf or have difficulty hearing?: Yes Does the patient have difficulty seeing, even when wearing glasses/contacts?: No Does the patient have difficulty concentrating, remembering, or making decisions?: No  Permission Sought/Granted Permission sought to share information with : Family Supports, Oceanographer granted to share information with : No  Share Information with NAME: Avelina Bough     Permission granted to share info w Relationship: Sister  Permission granted to share info w Contact Information: [phone]  Emotional Assessment       Orientation: : Oriented to Situation, Oriented to  Time, Oriented to Place, Oriented to Self Alcohol / Substance Use: Not Applicable Psych Involvement: No (comment)  Admission diagnosis:  Lactic acidosis [E87.20] Aspiration pneumonia (HCC) [J69.0] Acute respiratory failure, unspecified whether with hypoxia or hypercapnia (HCC) [J96.00] Aspiration pneumonia of both lungs due to gastric secretions, unspecified part of lung (HCC) [J69.0] Patient Active Problem List   Diagnosis Date Noted   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   Chronic health problem 02/06/2024   Dysphagia 01/23/2023   Underweight (BMI < 18.5) 01/23/2023   Constipation 01/23/2023   Hypokalemia 01/22/2023   Type 2 diabetes mellitus with stage 2 chronic kidney disease, with long-term current use of insulin  (HCC) 01/22/2023   Hematuria 11/23/2022  Hypokalemia and Hypomagnesemia 11/16/2022   Hyperosmolar hyperglycemic state (HHS) (HCC) 11/16/2022   Type 1 diabetes mellitus with hyperglycemia (HCC) 11/16/2022   T2DM (type 2 diabetes mellitus) (HCC) 11/15/2022   Type 2 diabetes mellitus with hyperglycemia (HCC) 11/15/2022   Hypernatremia 11/15/2022   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   Goals of care,  counseling/discussion 01/27/2021   Aspiration pneumonia (HCC) 01/20/2021   Acute respiratory failure (HCC)    Spastic quadriplegic cerebral palsy (HCC)    Chronic HFrEF (heart failure with reduced ejection fraction) (HCC)    Dementia without behavioral disturbance (HCC)    Pressure injury of skin 09/22/2020   Right pulmonary embolus 09/21/2020   Inanition    Foot ulcer with necrosis of muscle, right (HCC) 07/18/2020   Lactic acidosis 07/18/2020   Protein-calorie malnutrition    Gangrene of right foot (HCC) 07/17/2020   Focal motor deficit 07/07/2020   Physical deconditioning 05/06/2020   Prediabetes 07/20/2016   HALLUX RIGIDUS, ACQUIRED 04/11/2010   PROSTATE CANCER 09/18/2007   GLUCOSE INTOLERANCE 05/16/2006   HYPERCHOLESTEROLEMIA 05/16/2006   Infantile cerebral palsy (HCC) 05/16/2006   HYPERTENSION, BENIGN SYSTEMIC 05/16/2006   BPH 05/16/2006   PCP:  Romelle Booty, MD Pharmacy:   [facility] [phone] [address], [place] - [address] AT [address] [place] [address] Phone: [phone] Fax: [phone]  [facility] [address] [place] [address] Phone: [phone] Fax: [phone]     Social Drivers of Health (SDOH) Social History: SDOH Screenings   Food Insecurity: No Food Insecurity (01/22/2023)  Housing: Patient Declined (01/22/2023)  Transportation Needs: No Transportation Needs (01/22/2023)  Utilities: Not At Risk (01/22/2023)  Alcohol Screen: Low Risk  (09/24/2022)  Depression (PHQ2-9): Low Risk  (10/07/2023)  Financial Resource Strain: Low Risk  (09/24/2022)  Physical Activity: Inactive (09/24/2022)  Social Connections: Socially Isolated (09/24/2022)  Stress: No Stress Concern Present (09/24/2022)  Tobacco Use: Medium Risk (02/06/2024)   SDOH Interventions:     Readmission Risk Interventions     No data to display

## 2024-02-10 NOTE — Progress Notes (Addendum)
 Daily Progress Note Intern Pager: [phone]  Patient name: Rick Schwartz Medical record number: [id number] Date of birth: 1940-05-18 Age: 83 y.o. Gender: male  Primary Care Provider: Romelle Booty, MD Consultants: Cardiology Code Status: DNR  Pt Overview and Major Events to Date:  11/20: Admitted for aspiration pneumonia   [name] is an 83 y.o. M with history of spastic quadriplegic CP, HTN, protein calorie malnutrition, prostate cancer, critical limb ischemia s/p R AKA, dementia, T2DM, HFrEF admitted for likely aspiration pneumonia. Assessment & Plan Aspiration pneumonia (HCC) High concern for active aspiration with increased secretions this morning.  S/p Zosyn  and Unasyn .  - Transition oral Augmentin  back to IV Unasyn  with end date of 11/27 for complete antibiotic course   - Transition VTE ppx to treatment dose lovenox  - Appreciate PT/OT recs  - N.p.o. with high risk of aspiration - Stat chest x-ray, VBG, chest PT with deep suctioning - Urine Legionella negative - AM CBC, BMP - Consider family meeting with sister to discuss NG tube for feeding to prevent further episodes of aspiration Chronic HFrEF (heart failure with reduced ejection fraction) (HCC) Patient with chronic HFrEF though echo during this admission with worsening EF 25 to 30%.  Per cardiology, resume GDMT as tolerated and plan for outpatient follow-up.  Patient with history of not tolerating GDMT well due to low blood pressure.   - Hold oral GDMT at this time - CTM blood pressure in room consider starting ARB if stable T2DM (type 2 diabetes mellitus) (HCC) Recent A1c levels stable.  Blood glucose stable on BMP. - Hold oral jardiance  - CTM glucose on BMP Adrenal incidentaloma Persistent 3.2 x 2 cm left adrenal nodule and a 0.9 cm right adrenal nodule on 11/20 CT AP.  - Adrenal washout CT ordered  Chronic health problem A fib: hold Eliquis  2.5 twice daily until able to PO again HLD: Crestor  20 mg daily    FEN/GI: NPO with aspiration risk PPx: Eliquis  Dispo:Home pending clinical improvement . Barriers include high aspiration risk.   Subjective:  Patient was seen and examined at bedside.  I am unable to understand him today, unsure if more due to increased secretions or hypercarbia affecting cognition.  The patient does seem uncomfortable and is able to relay to the nursing eye that he cannot breathe well even though he is satting 98% plus on room air.  I have placed nasal cannula back on patient for comfort.  He is requesting BiPAP but that is contraindicated with the amount of secretions he is generating.  I tried suctioning this morning but not much was coming out, high concern for active aspiration at this time and will make NPO.  Objective: Temp:  [96.6 F (35.9 C)-97.8 F (36.6 C)] 97.5 F (36.4 C) (11/24 0700) Pulse Rate:  [62-85] 68 (11/24 0700) Resp:  [14-20] 14 (11/24 0700) BP: (134-161)/(74-95) 136/74 (11/24 0700) SpO2:  [87 %-100 %] 100 % (11/24 0700) FiO2 (%):  [40 %] 40 % (11/23 2342) Weight:  [60.8 kg] 60.8 kg (11/24 0530) Physical Exam: General: Sitting upright, in no acute distress on room air Cardiovascular: Unable to auscultate over rhonchorous breath sounds Respiratory: Coarse rhonchi throughout all lung fields, upper airway noise secondary to secretions, normal work of breathing Abdomen: Soft, nontender, nondistended Extremities: Spastic quadriplegia  Laboratory: Most recent CBC Lab Results  Component Value Date   WBC 6.9 02/09/2024   HGB 12.5 (L) 02/09/2024   HCT 42.8 02/09/2024   MCV 84.8 02/09/2024  PLT 214 02/09/2024   Most recent BMP    Latest Ref Rng & Units 02/10/2024    2:25 AM  BMP  Glucose 70 - 99 mg/dL 97   BUN 8 - 23 mg/dL 14   Creatinine 9.38 - 1.24 mg/dL 9.13   Sodium 864 - 854 mmol/L 145   Potassium 3.5 - 5.1 mmol/L 3.9   Chloride 98 - 111 mmol/L 111   CO2 22 - 32 mmol/L 26   Calcium  8.9 - 10.3 mg/dL 8.9    Lupie Credit,  DO 02/10/2024, 7:06 AM  PGY-1, [facility] FPTS Intern pager: [phone], text pages welcome Secure chat group [facility] Teaching Service

## 2024-02-10 NOTE — Assessment & Plan Note (Addendum)
 Patient with chronic HFrEF though echo during this admission with worsening EF 25 to 30%.  Per cardiology, resume GDMT as tolerated and plan for outpatient follow-up.  Patient with history of not tolerating GDMT well due to low blood pressure.   - Hold oral GDMT at this time - CTM blood pressure in room consider starting ARB if stable

## 2024-02-10 NOTE — Assessment & Plan Note (Addendum)
 A fib: hold Eliquis  2.5 twice daily until able to PO again HLD: Crestor  20 mg daily

## 2024-02-10 NOTE — Assessment & Plan Note (Signed)
 Persistent 3.2 x 2 cm left adrenal nodule and a 0.9 cm right adrenal nodule on 11/20 CT AP.  - Adrenal washout CT ordered

## 2024-02-10 NOTE — Assessment & Plan Note (Addendum)
 High concern for active aspiration with increased secretions this morning.  S/p Zosyn  and Unasyn .  - Transition oral Augmentin  back to IV Unasyn  with end date of 11/27 for complete antibiotic course   - Transition VTE ppx to treatment dose lovenox  - Appreciate PT/OT recs  - N.p.o. with high risk of aspiration - Stat chest x-ray, VBG, chest PT with deep suctioning - Urine Legionella negative - AM CBC, BMP - Consider family meeting with sister to discuss NG tube for feeding to prevent further episodes of aspiration

## 2024-02-10 NOTE — Progress Notes (Signed)
 Date and time results received: 02/10/24 1025   Test: ABG - venous Critical Value: >31  Name of Provider Notified: Elodie paged  Orders Received? Or Actions Taken?: Awaiting orders

## 2024-02-10 NOTE — Progress Notes (Signed)
 PHARMACY - ANTICOAGULATION CONSULT NOTE  Pharmacy Consult for enoxaparin  Indication: atrial fibrillation  No Known Allergies  Patient Measurements: Height: 5' 7 (170.2 cm) Weight: 60.8 kg (134 lb 0.6 oz) IBW/kg (Calculated) : 66.1 HEPARIN  DW (KG): 60.6  Vital Signs: Temp: 97.5 F (36.4 C) (11/24 0700) Temp Source: Axillary (11/24 0700) BP: 136/74 (11/24 0700) Pulse Rate: 68 (11/24 0700)  Labs: Recent Labs    02/08/24 0253 02/09/24 0209 02/10/24 0225  HGB  --  12.5*  --   HCT  --  42.8  --   PLT  --  214  --   CREATININE 1.13 1.08 0.86    Estimated Creatinine Clearance: 57 mL/min (by C-G formula based on SCr of 0.86 mg/dL).   Assessment:  83 y.o. M with history of atrial fibrillation on apixaban  2.5mg  PO BID who is admitted for aspiration pneumonia in the setting of diet/dysphagia PTA. Now NPO with worsening secretions. Pharmacy consulted to manage enoxaparin  while DOAC on hold. Last apixaban  dose 11/23 PM. CBC wnl.  Goal of Therapy:  Anti-Xa level 0.6-1 units/ml 4hrs after LMWH dose given Monitor platelets by anticoagulation protocol: Yes   Plan:  Start enoxaparin  60 mg Q12h Check anti-Xa levels as needed while on enoxaparin  Continue to monitor H&H and platelets F/u resuming DOAC when able to tolerate PO  Thank you for allowing pharmacy to be a part of this patient's care.  Shelba Collier, PharmD, BCPS Clinical Pharmacist

## 2024-02-10 NOTE — Care Management Important Message (Signed)
 Important Message  Patient Details  Name: Rick Schwartz MRN: [id number] Date of Birth: April 02, 1940   Important Message Given:  Yes - Medicare IM     [name] 02/10/2024, 12:05 PM

## 2024-02-10 NOTE — Clinical Note (Incomplete)
 Date and time results received: 02/10/24 10:25   Test: ABG - vENOUS O2 Critical Value: less than 31  Name of Provider Notified: ***  Orders Received? Or Actions Taken?: {ED Critical Value actions [id number]

## 2024-02-10 NOTE — Progress Notes (Signed)
 Speech Language Pathology Treatment: Dysphagia  Patient Details Name: Rick Schwartz MRN: [id number] DOB: September 29, 1940 Today's Date: 02/10/2024 Time: 8759-8744 SLP Time Calculation (min) (ACUTE ONLY): 15 min  Assessment / Plan / Recommendation Clinical Impression  Recommend continue NPO status with f/u MBS to assess current swallow function given overt s/s of aspiration and deconditioning with recommended diet of Dysphagia 1/thin via tsp/cup amounts.   Pt seen for dysphagia tx f/u session with pt exhibiting an immediate cough with tsp amounts of thin with mod-max verbal cues/upright positioning provided during session for swallow initiation and multiple swallows/throat clearing intermittently noted.  Vocal quality change observed this session with wet, hypophonic voice observed and pt appears increasingly dyspneic and HR/RR elevated during po trials (HR ranged from 80-130, RR 19-35) with nursing stating pt was placed NPO d/t suspected aspiration.  Pt has been consuming small sips via cup/straw and taking medications crushed in puree since admission/BSE completed.     HPI HPI: Rick Schwartz is an 83yo M with PMH spastic quadriplegic cerebral palsy, HTN, BPH, protein calorie malnutrition who presented via EMS with ?1 day h/o shortness of breath on 02/06/24. Multifocal opacifications on CT chest with narrowing of RLL bronchus possibly containing aspirate material. Chart review revealed reported change in diet about 6 months ago from pureed to regular, with phone call to sister confirming pt consumes soft consistencies. MBS completed on 11/14/22 with pt exhibiting severe dysphagia, was sent home with home health speech therapy with Dysphagia 1/nectar-thickened liquids. BSE completed prior hospitalization on 01/23/23 recommending Dysphagia 1/thin liquids. PMHx significant for prostate cancer, h/o critical limb ischemia s/p R AKA, T2DM and dementia. ST re-consulted for clinical swallow evaluation with Dysphagia  1/thin via small sips recommended.  ST f/u for diet tolerance/dysphagia tx.      SLP Plan  MBS          Recommendations  Diet recommendations: NPO;Other(comment) (sips of thin/ice chips allowed after oral care) Liquids provided via: Teaspoon Medication Administration: Crushed with puree Supervision: Full supervision/cueing for compensatory strategies;Trained caregiver to feed patient                  Oral care QID;Oral care prior to ice chip/H20;Staff/trained caregiver to provide oral care   Frequent or constant Supervision/Assistance Dysphagia, oropharyngeal phase (R13.12)     MBS     Pat Rick Schwartz,M.S.,CCC-SLP  02/10/2024, 12:56 PM

## 2024-02-10 NOTE — Plan of Care (Signed)
 Called to update sister on patient's new suspected aspiration event this morning. Per SLP and primary team patient was made NPO due to worry for airway compromise.   Discussed all medical options with his sister, Avelina, including feeding tube verses accepting the risk of PO for aspiration. She is conflicted on her decision due to him having pleasure from eating. She acknowledges that he has a poor quality of life at baseline and she would hesitate to take something away that he enjoys.   She will talk to her sisters and come to bedside tomorrow. Will need to continue goals of care discussions with her and the patient on treatment course.  Plan:  Keep NPO Barium Swallow pending  Consider gentle fluids tomorrow for hydration   Damien Pinal, DO [facility], PGY-3 02/10/24 3:52 PM

## 2024-02-10 NOTE — Assessment & Plan Note (Addendum)
 Recent A1c levels stable.  Blood glucose stable on BMP. - Hold oral jardiance  - CTM glucose on BMP

## 2024-02-11 ENCOUNTER — Other Ambulatory Visit (HOSPITAL_COMMUNITY): Payer: Self-pay

## 2024-02-11 ENCOUNTER — Inpatient Hospital Stay (HOSPITAL_COMMUNITY)

## 2024-02-11 DIAGNOSIS — J690 Pneumonitis due to inhalation of food and vomit: Secondary | ICD-10-CM | POA: Diagnosis not present

## 2024-02-11 DIAGNOSIS — E1142 Type 2 diabetes mellitus with diabetic polyneuropathy: Secondary | ICD-10-CM | POA: Diagnosis not present

## 2024-02-11 DIAGNOSIS — E278 Other specified disorders of adrenal gland: Secondary | ICD-10-CM | POA: Diagnosis not present

## 2024-02-11 DIAGNOSIS — I5022 Chronic systolic (congestive) heart failure: Secondary | ICD-10-CM | POA: Diagnosis not present

## 2024-02-11 LAB — BASIC METABOLIC PANEL WITH GFR
Anion gap: 14 (ref 5–15)
BUN: 11 mg/dL (ref 8–23)
CO2: 27 mmol/L (ref 22–32)
Calcium: 8.5 mg/dL — ABNORMAL LOW (ref 8.9–10.3)
Chloride: 110 mmol/L (ref 98–111)
Creatinine, Ser: 1.34 mg/dL — ABNORMAL HIGH (ref 0.61–1.24)
GFR, Estimated: 53 mL/min — ABNORMAL LOW (ref >60)
Glucose, Bld: 92 mg/dL (ref 70–99)
Potassium: 3.9 mmol/L (ref 3.5–5.1)
Sodium: 151 mmol/L — ABNORMAL HIGH (ref 135–145)

## 2024-02-11 LAB — CBC WITH DIFFERENTIAL/PLATELET
Abs Immature Granulocytes: 0.02 K/uL (ref 0.00–0.07)
Basophils Absolute: 0 K/uL (ref 0.0–0.1)
Basophils Relative: 0 %
Eosinophils Absolute: 0.2 K/uL (ref 0.0–0.5)
Eosinophils Relative: 3 %
HCT: 43.6 % (ref 39.0–52.0)
Hemoglobin: 13.1 g/dL (ref 13.0–17.0)
Immature Granulocytes: 0 %
Lymphocytes Relative: 13 %
Lymphs Abs: 0.9 K/uL (ref 0.7–4.0)
MCH: 25 pg — ABNORMAL LOW (ref 26.0–34.0)
MCHC: 30 g/dL (ref 30.0–36.0)
MCV: 83.2 fL (ref 80.0–100.0)
Monocytes Absolute: 0.6 K/uL (ref 0.1–1.0)
Monocytes Relative: 9 %
Neutro Abs: 5.3 K/uL (ref 1.7–7.7)
Neutrophils Relative %: 75 %
Platelets: 225 K/uL (ref 150–400)
RBC: 5.24 MIL/uL (ref 4.22–5.81)
RDW: 17.2 % — ABNORMAL HIGH (ref 11.5–15.5)
WBC: 7.1 K/uL (ref 4.0–10.5)
nRBC: 0 % (ref 0.0–0.2)

## 2024-02-11 LAB — CULTURE, BLOOD (ROUTINE X 2): Culture: NO GROWTH

## 2024-02-11 MED ORDER — AMOXICILLIN-POT CLAVULANATE 875-125 MG PO TABS
1.0000 | ORAL_TABLET | Freq: Two times a day (BID) | ORAL | 0 refills | Status: DC
  Filled 2024-02-11: qty 3, 2d supply, fill #0

## 2024-02-11 MED ORDER — ORAL CARE MOUTH RINSE
15.0000 mL | OROMUCOSAL | Status: DC
  Administered 2024-02-11 – 2024-02-12 (×4): 15 mL via OROMUCOSAL

## 2024-02-11 MED ORDER — DM-GUAIFENESIN ER 30-600 MG PO TB12: 1.0000 | ORAL_TABLET | Freq: Every day | ORAL | Status: DC | PRN

## 2024-02-11 MED ORDER — ORAL CARE MOUTH RINSE: 15.0000 mL | OROMUCOSAL | Status: DC | PRN

## 2024-02-11 MED ORDER — AMOXICILLIN-POT CLAVULANATE 875-125 MG PO TABS
1.0000 | ORAL_TABLET | Freq: Two times a day (BID) | ORAL | Status: DC
Start: 2024-02-11 — End: 2024-02-12
  Administered 2024-02-11 – 2024-02-12 (×3): 1 via ORAL
  Filled 2024-02-11 (×3): qty 1

## 2024-02-11 MED ORDER — POTASSIUM CHLORIDE 20 MEQ PO PACK
20.0000 meq | PACK | Freq: Once | ORAL | Status: DC
  Filled 2024-02-11: qty 1

## 2024-02-11 NOTE — Progress Notes (Signed)
 Patient resting comfortably on [place] this AM with no respiratory distress noted.  Bipap is ordered for PRN and on standby at bedside.  Currently not indicated at this time.  Will continue to monitor.

## 2024-02-11 NOTE — Assessment & Plan Note (Addendum)
 A fib: hold Eliquis  2.5 twice daily until able to PO again HLD: Crestor  20 mg daily  T2DM: Recent A1c level stable, monitor glucose on BMP.  Hold home Jardiance .

## 2024-02-11 NOTE — Assessment & Plan Note (Addendum)
 Patient with chronic HFrEF though echo during this admission with worsening EF 25 to 30%.  Per cardiology, resume GDMT as tolerated and plan for outpatient follow-up.  Patient with history of not tolerating GDMT well due to low blood pressure earlier in hospital course, however unable to tolerate at this time due to AKI.   -Continue to hold oral GDMT at this time - CTM blood pressure in room, consider starting ARB if stable

## 2024-02-11 NOTE — Assessment & Plan Note (Deleted)
 Recent A1c levels stable.  Blood glucose stable on BMP. - Hold oral jardiance  - CTM glucose on BMP

## 2024-02-11 NOTE — Plan of Care (Signed)
  Problem: Clinical Measurements: Goal: Ability to maintain clinical measurements within normal limits will improve Outcome: Progressing Goal: Diagnostic test results will improve Outcome: Progressing Goal: Respiratory complications will improve Outcome: Progressing   Problem: Nutrition: Goal: Adequate nutrition will be maintained Outcome: Progressing   Problem: Coping: Goal: Level of anxiety will decrease Outcome: Progressing   Problem: Respiratory: Goal: Ability to maintain adequate ventilation will improve Outcome: Progressing

## 2024-02-11 NOTE — Evaluation (Addendum)
 Modified Barium Swallow Study  Patient Details  Name: Rick Schwartz MRN: [id number] Date of Birth: 11/01/1940  Today's Date: 02/11/2024  Modified Barium Swallow completed.  Full report located under Chart Review in the Imaging Section.  History of Present Illness Rick Schwartz is an 83yo M with PMH spastic quadriplegic cerebral palsy, HTN, BPH, protein calorie malnutrition who presented via EMS with ?1 day h/o shortness of breath on 02/06/24. Multifocal opacifications on CT chest with narrowing of RLL bronchus possibly containing aspirate material. Chart review revealed reported change in diet about 6 months ago from pureed to regular, with phone call to sister confirming pt consumes soft consistencies. MBS completed on 11/14/22 with pt exhibiting severe dysphagia, was sent home with home health speech therapy with Dysphagia 1/nectar-thickened liquids. Bedside swallow eval completed prior hospitalization on 01/23/23 recommending Dysphagia 1/thin liquids. PMHx significant for prostate cancer, h/o critical limb ischemia s/p R AKA, T2DM and dementia. ST re-consulted for clinical swallow evaluation with Dysphagia 1/thin via small sips recommended.  ST f/u for diet tolerance/dysphagia tx.     Clinical Impression  Pt presents with quite functional swallow, much improved from 8/24 study.  He demonstrated attentive and active mastication of solids with strong propulsion through oropharynx.  Laryngeal vestibule closure was reliable with no penetration or aspiration.  There was only minimal, fluctuating residue in the valleculae post-swallow.  Recommend advancing diet to mechanical soft, thin liquids. There remain multiple factors that increase his risk of pna in the presence of potential aspiration: Poor general health and/or compromised immunity; reduced cognitive function; limited mobility; weak cough; dependence for feeding and/or oral hygiene; frail or deconditioned Rick Schwartz and Rick Schwartz 2021). Focus should  be on mitigating the risk factors that may reduce likelihood of pna: positioning pt as upright as possible; providing careful hand-feeding; promoting good oral hygiene. D/W medical team.   Returned to room after MBS and reviewed results with sister at the bedside. Provided with handout with recs to reduce pna risk.  No further SLP f/u is needed.    Factors that may increase risk of adverse event in presence of aspiration Rick Schwartz & Rick Schwartz 2021): Limited mobility;Poor general health and/or compromised immunity;Dependence for feeding and/or oral hygiene;Weak cough  Swallow Evaluation Recommendations Recommendations: PO diet PO Diet Recommendation: Dysphagia 3 (Mechanical soft);Thin liquids (Level 0) Liquid Administration via: Straw Medication Administration: Whole meds with puree Supervision: Full assist for feeding Swallowing strategies  : Small bites/sips Postural changes: Position pt fully upright for meals Oral care recommendations: Oral care BID (2x/day)     Rick Schwartz L. [name], MA CCC/SLP Clinical Specialist - Acute Care SLP Acute Rehabilitation Services Office number [phone]  Rick Schwartz Rick Schwartz 02/11/2024,9:36 AM

## 2024-02-11 NOTE — Assessment & Plan Note (Addendum)
 Clinically much better today after chest PT with deep suctioning yesterday. Smiling and interactive however seems to be needing oxygen when not on home O2. - Transition IV Unasyn  back to oral Augmentin  with end date of 11/26 for complete antibiotic course of 7 days total - Appreciate PT/OT recs  - AM BMP - Consider family meeting with sister to discuss NG tube for feeding to prevent further episodes of aspiration - Barium swallow passed and may eat regular diet as long as he is sitting upright and someone is feeding him patiently. -  mitigate inpatient aspiration risk by offering pured foods only, may upgrade to regular diet at discharge -Consider discharge today or tomorrow with clinical improvement and passing swallow study

## 2024-02-11 NOTE — Progress Notes (Signed)
 Daily Progress Note Intern Pager: [phone]  Patient name: Rick Schwartz Medical record number: [id number] Date of birth: 02/13/1941 Age: 83 y.o. Gender: male  Primary Care Provider: Romelle Booty, MD Consultants: Cardiology Code Status: DNR  Pt Overview and Major Events to Date:  11/20: Admitted for aspiration pneumonia 11/24: Increased risk for aspiration with increased secretions and dysphagia; needing chest physiotherapy, deep suctioning and made n.p.o. 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 Assessment & Plan Aspiration pneumonia (HCC) Clinically much better today after chest PT with deep suctioning yesterday. Smiling and interactive however seems to be needing oxygen when not on home O2. - Transition IV Unasyn  back to oral Augmentin  with end date of 11/26 for complete antibiotic course of 7 days total - Appreciate PT/OT recs  - AM BMP - Consider family meeting with sister to discuss NG tube for feeding to prevent further episodes of aspiration - Barium swallow passed and may eat regular diet as long as he is sitting upright and someone is feeding him patiently. -  mitigate inpatient aspiration risk by offering pured foods only, may upgrade to regular diet at discharge -Consider discharge today or tomorrow with clinical improvement and passing swallow study AKI (acute kidney injury) Likely in the setting of decreased p.o. intake as patient was n.p.o. yesterday for increased secretions to prevent aspiration.   - Continue to hold jardiance  - Encourage oral hydration since patient has been cleared by SLP Chronic HFrEF (heart failure with reduced ejection fraction) (HCC) Patient with  chronic HFrEF though echo during this admission with worsening EF 25 to 30%.  Per cardiology, resume GDMT as tolerated and plan for outpatient follow-up.  Patient with history of not tolerating GDMT well due to low blood pressure earlier in hospital course, however unable to tolerate at this time due to AKI.   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  - Adrenal washout CT 11/24: See below for impression - +/- Follow-up CT imaging in 6 to 12 months to ensure stability Chronic health problem A fib: hold Eliquis  2.5 twice daily until able to PO again HLD: Crestor  20 mg daily  T2DM: Recent A1c level stable, monitor glucose on BMP.  Hold home Jardiance .  FEN/GI: N.p.o., awaiting barium swallow PPx: Treatment dose Lovenox  Dispo:Home with home health pending clinical improvement .  Subjective:  Patient was seen and examined at bedside.  He is in better spirits today, there is no audible gurgling from secretions. He smiles when I ask if he is ready to go home.   Objective: Temp:  [97.6 F (36.4 C)-98.4 F (36.9 C)] 97.8 F (36.6 C) (11/25 0710) Pulse Rate:  [70-101] 78 (11/25 0710) Resp:  [13-23] 22 (11/25 0710) BP: (110-168)/(70-99) 121/85 (11/25 0710) SpO2:  [94 %-100 %] 95 % (11/25 0710) Weight:  [61.5 kg] 61.5 kg (11/25 0326) Physical Exam: General: chronically ill appearing male sitting upright in hospital bed in no acute distress Respiratory: improved rhonchi from prior, no wheezes, normal WOB on 1L O2 Abdomen: non-distended Extremities: spastic quadriplegia  Laboratory:  Most recent CBC Lab Results  Component Value Date   WBC 7.1 02/11/2024   HGB 13.1 02/11/2024   HCT 43.6 02/11/2024   MCV 83.2 02/11/2024   PLT 225 02/11/2024   Most recent BMP    Latest Ref Rng & Units 02/11/2024    3:12 AM  BMP   Glucose 70 - 99 mg/dL 92   BUN 8 - 23 mg/dL 11   Creatinine 9.38 - 1.24 mg/dL 8.65   Sodium 864 - 854 mmol/L 151   Potassium 3.5 - 5.1 mmol/L 3.9   Chloride 98 - 111 mmol/L 110   CO2 22 - 32 mmol/L 27   Calcium  8.9 - 10.3 mg/dL 8.5     Other pertinent labs:  VBG 11/24: pH 7.36, pCO2 55, PaO2 less than 31, acid base excess 4.2, HCO3 31.1  Imaging/Diagnostic Tests: 1 view chest x-ray 11/24 Radiologist Impression:  1.  Persistent confluent right basilar opacity corresponding to airspace disease and small pleural effusion on 02/06/2024 CT. 2.  No new cardiopulmonary abnormality.  Imaging/Diagnostic Tests: CT adrenal abdomen with and without contrast 11/24 Radiologist Impression:  1.  10 mm right adrenal nodule with imaging features compatible with benign adrenal adenoma. 2.  No overt left adrenal mass.  Coronal imaging shows that the axial findings were present nodular thickening of the left adrenal gland with absolute washout of 57% and relative washout of 27%, indeterminate.  This finding has been stable on axial imaging comparing back to the chest CT of 09/20/2020 and is most likely benign, potentially lipid poor adenoma.  Follow-up CT imaging in 6 to 12 months could be used to ensure stability as clinically warranted. 3.  17 mm cystic lesion mid pancreas with 12 mm cystic lesion pancreatic tail.  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  Follow-up MRI in 6 months recommended to ensure stability. 4.  Bibasilar collapse and consolidation with small bilateral pleural effusions. 5.  Cholelithiasis versus tumefactive sludge in the gallbladder. 6.  Aortic atherosclerosis.  Lupie Credit, DO 02/11/2024, 7:13 AM  PGY-1, [facility] Family Medicine FPTS Intern pager: [phone], text pages welcome Secure chat group [facility] Teaching Service

## 2024-02-11 NOTE — Plan of Care (Signed)
  Problem: Clinical Measurements: Goal: Ability to maintain clinical measurements within normal limits will improve Outcome: Progressing Goal: Respiratory complications will improve Outcome: Progressing   Problem: Skin Integrity: Goal: Risk for impaired skin integrity will decrease Outcome: Progressing   Problem: Respiratory: Goal: Ability to maintain adequate ventilation will improve Outcome: Progressing Goal: Ability to maintain a clear airway will improve Outcome: Progressing

## 2024-02-11 NOTE — Assessment & Plan Note (Addendum)
 Persistent 3.2 x 2 cm left adrenal nodule and a 0.9 cm right adrenal nodule on 11/20 CT AP.  - Adrenal washout CT 11/24: See below for impression - +/- Follow-up CT imaging in 6 to 12 months to ensure stability

## 2024-02-11 NOTE — Assessment & Plan Note (Addendum)
 Likely in the setting of decreased p.o. intake as patient was n.p.o. yesterday for increased secretions to prevent aspiration.   - Continue to hold jardiance  - Encourage oral hydration since patient has been cleared by SLP

## 2024-02-12 ENCOUNTER — Other Ambulatory Visit (HOSPITAL_COMMUNITY): Payer: Self-pay

## 2024-02-12 ENCOUNTER — Other Ambulatory Visit: Payer: Self-pay | Admitting: Family Medicine

## 2024-02-12 DIAGNOSIS — J690 Pneumonitis due to inhalation of food and vomit: Secondary | ICD-10-CM | POA: Diagnosis not present

## 2024-02-12 DIAGNOSIS — E1142 Type 2 diabetes mellitus with diabetic polyneuropathy: Secondary | ICD-10-CM | POA: Diagnosis not present

## 2024-02-12 DIAGNOSIS — E278 Other specified disorders of adrenal gland: Secondary | ICD-10-CM | POA: Diagnosis not present

## 2024-02-12 DIAGNOSIS — I5022 Chronic systolic (congestive) heart failure: Secondary | ICD-10-CM | POA: Diagnosis not present

## 2024-02-12 LAB — BASIC METABOLIC PANEL WITH GFR
Anion gap: 10 (ref 5–15)
BUN: 10 mg/dL (ref 8–23)
CO2: 24 mmol/L (ref 22–32)
Calcium: 8.7 mg/dL — ABNORMAL LOW (ref 8.9–10.3)
Chloride: 109 mmol/L (ref 98–111)
Creatinine, Ser: 0.93 mg/dL (ref 0.61–1.24)
GFR, Estimated: >60 mL/min (ref >60)
Glucose, Bld: 82 mg/dL (ref 70–99)
Potassium: 3.5 mmol/L (ref 3.5–5.1)
Sodium: 143 mmol/L (ref 135–145)

## 2024-02-12 MED ORDER — POTASSIUM CHLORIDE 20 MEQ PO PACK
40.0000 meq | PACK | Freq: Once | ORAL | Status: AC
  Administered 2024-02-12: 40 meq via ORAL
  Filled 2024-02-12: qty 2

## 2024-02-12 NOTE — Progress Notes (Signed)
 Pt sister provided with verbal discharge instructions. Paper copy of discharge provided to patient. Provide patient sister with speech eval recommendations.  RN answered all questions. VSS at discharge. IV's removed. Patient belongings sent with patient. Patient dc'd via wheelchair to private vehicle.

## 2024-02-12 NOTE — Plan of Care (Signed)
  Problem: Clinical Measurements: Goal: Will remain free from infection Outcome: Progressing   Problem: Elimination: Goal: Will not experience complications related to bowel motility Outcome: Progressing Goal: Will not experience complications related to urinary retention Outcome: Progressing   Problem: Pain Managment: Goal: General experience of comfort will improve and/or be controlled Outcome: Progressing

## 2024-02-12 NOTE — TOC Transition Note (Signed)
 Transition of Care [facility]) - Discharge Note   Patient Details  Name: Rick Schwartz MRN: [id number] Date of Birth: 07-15-1940  Transition of Care [facility]) CM/SW Contact:  Roxie KANDICE Stain, RN Phone Number: 02/12/2024, 1:03 PM   Clinical Narrative:    Beatris to sister, Avelina, and legal guardian regarding discharge. Avelina states she will transport him home and he has all the needed DME.     Final next level of care: Home/Self Care Barriers to Discharge: Barriers Resolved   Patient Goals and CMS Choice Patient states their goals for this hospitalization and ongoing recovery are:: sister wants him to return home          Discharge Placement               home        Discharge Plan and Services Additional resources added to the After Visit Summary for                                       Social Drivers of Health (SDOH) Interventions SDOH Screenings   Food Insecurity: No Food Insecurity (01/22/2023)  Housing: Patient Declined (01/22/2023)  Transportation Needs: No Transportation Needs (01/22/2023)  Utilities: Not At Risk (01/22/2023)  Alcohol Screen: Low Risk  (09/24/2022)  Depression (PHQ2-9): Low Risk  (10/07/2023)  Financial Resource Strain: Low Risk  (09/24/2022)  Physical Activity: Inactive (09/24/2022)  Social Connections: Socially Isolated (09/24/2022)  Stress: No Stress Concern Present (09/24/2022)  Tobacco Use: Medium Risk (02/06/2024)     Readmission Risk Interventions    02/12/2024    1:03 PM  Readmission Risk Prevention Plan  Transportation Screening Complete  PCP or Specialist Appt within 5-7 Days Complete  Home Care Screening Complete  Medication Review (RN CM) Complete

## 2024-02-12 NOTE — Plan of Care (Signed)
 Spoke to patient sister and POA Avelina. She is aware of SLP instructions to make sure patient sits upright and chews his food completely before swallowing. She will be coming by around 1400 to pick up patient after work.

## 2024-02-12 NOTE — Discharge Instructions (Addendum)
 Dear Rick Schwartz,  Thank you for letting us  participate in your care. You were hospitalized for and diagnosed with Aspiration pneumonia (HCC). You were treated with IV steroids, IV antibiotics, chest physiotherapy, supplemental oxygen.  POST-HOSPITAL & CARE INSTRUCTIONS Please follow-up with your PCP within 1 week for hospital follow-up. Return to the ED for any persistent or worsening respiratory distress, or if you feel the need to be seen again Follow-up with future appointments listed below.  DOCTOR'S APPOINTMENT   Future Appointments  Date Time Provider Department Center  02/18/2024 10:15 AM Court Dorn PARAS, MD [facility]    Take care and be well!  Family Medicine Teaching Service Inpatient Team [place]  [facility]  [address] [place], [place] [address] [phone]

## 2024-02-12 NOTE — Discharge Summary (Addendum)
 Family Medicine Teaching [facility] Discharge Summary  Patient name: Rick Schwartz Medical record number: [id number] Date of birth: [birth year] Age: 83 y.o. Gender: male Date of Admission: 02/06/2024  Date of Discharge: 02/12/2024 Admitting Physician: Damien Pinal, DO  Primary Care Provider: Romelle Booty, MD Consultants: None  Indication for Hospitalization: respiratory distress aspiration pneumonia  Brief Hospital Course:  Rick Schwartz is a 83 y.o. male admitted for aspiration pneumonia. His hospital course is outlined below:   Aspiration Pneumonia:  Arrived to the ED in respiratory distress. Received solumedrol by EMS and placed on BiPAP. CT PE negative for PE but showed evidence of aspiration pneumonia. LA elevated to 4.5 and he was given IVF resuscitation and started on Zosyn  (11/20-11/21). On 11/21, blood cultures showed GPC in 1/4 bottles which was likely a contaminant and pharmacy recommended to continue antibiotics at that time. He was then transitioned to IV Unasyn  (11/21-11/22) and then to oral Augmentin  (11/23-11/27). Final blood cultures showed staph hemolyticus in 1 bottle, most likely a contaminant.  Strep pneumo negative. Urine legionella negative. He was weaned off BiPAP on 11/21 and placed again the night of 11/22. He was able to be weaned off the morning of 11/23 and did not require BiPAP again throughout hospital course.  On 11/23 the patient appeared to be worsening with increased secretions and coarse rhonchi throughout all lung fields.  A stat chest x-ray, VBG, and chest PT with deep suctioning were ordered.  Chest x-ray and VBG were unremarkable.  Chest PT with deep suctioning were extremely beneficial and patient was clear throughout the rest of hospital stay.  He was placed on 2 L nasal cannula for comfort however was able to be weaned off prior to discharge and was satting well above 94% on room air.  Patient passed speech eval and MBS on 11/24.  To prevent  further aspiration while admitted, the patient was placed on a soft diet and carefully monitored while eating upright.  SLP spoke to medicine team as well as patient Sister to clear for regular diet once discharged with proper seating upright and patience with solid foods.  PCP follow-up: Repeat BMP for kidney function  Restart Jardiance  once kidney function improved  Potassium supplementation held at discharge, consider restarting as indicated 17 mm cystic lesion mid pancreas; Follow-up MRI in 6 months recommended to ensure stability if aligning with goals of care Follow-up CT imaging of adrenal incidentaloma in 6-12 months if aligning with goals of care  Outpatient cardiology follow up for reduced EF from prior study  Discharge Diagnoses/Problem List:  [facility]     * (Principal) Aspiration pneumonia (HCC)     Lactic acidosis     Chronic HFrEF (heart failure with reduced ejection fraction) (HCC)     Acute respiratory failure (HCC)     AKI (acute kidney injury)     T2DM (type 2 diabetes mellitus) (HCC)     Chronic health problem     Adrenal incidentaloma   Disposition: Home   Discharge Condition: Stable  Discharge Exam:  GEN: Calm sitting upright in bed in no acute distress CV: RRR, no M/R/G RESP: Adequate respiratory effort, normal work of breathing on room air, CTAB ABD: Soft, nontender, nondistended, bowel sounds present  Significant Procedures: chest physiotherapy and deep suction  Significant Labs and Imaging:  Recent Labs  Lab 02/11/24 0312  WBC 7.1  HGB 13.1  HCT 43.6  PLT 225   Recent Labs  Lab 02/11/24 0312 02/12/24 0307  NA 151* 143  K 3.9 3.5  CL 110 109  CO2 27 24  GLUCOSE 92 82  BUN 11 10  CREATININE 1.34* 0.93  CALCIUM  8.5* 8.7*   Chest x-ray 02/06/2024: IMPRESSION: 1. Mild pulmonary edema. 2. Possible small right pleural effusion with blunting of the right costophrenic angle.  CT angio chest PE with and/or without  contrast 02/06/2024: IMPRESSION: 1. No evidence of pulmonary embolism. 2. Small right pleural effusion and tiny amount of left pleural fluid. Bibasilar airspace opacification right worse than left likely atelectasis, although infection is possible. Mild hazy opacification over the posterior left upper lobe which may be due to atelectasis or infection. 3. Narrowing of the right lower lobe bronchus which may contain mild aspirate material. 4. Aortic atherosclerosis. Atherosclerotic coronary artery disease. 5. Cholelithiasis versus tumefactive sludge unchanged. 6. 9 mm simple cystic lesion over the body of the pancreas unchanged. Recommend follow-up CT 1 year  CT abdomen pelvis with contrast 02/06/2024: IMPRESSION: 1. Trace bilateral pleural effusions, right greater than left, with associated bilateral lower lobe atelectasis. 2. Cholelithiasis with no CT evidence of acute cholecystitis. 3. Persistent 3.2 x 2 cm left adrenal nodule and a 0.9 cm right adrenal nodule ; recommend adrenal washout CT or chemical-shift MR and biochemical evaluation, Indeterminate. 4. Left inguinal hernia containing a short loop of colon and mesenteric fat with an abdominal defect of 1.7 cm. Associated findings suggest ischemia or obstruction. 5. Indeterminate 1.2cm and 1.7 cm fluid density lesion within the pancreatic body. Recommend attention on follow-up adrenal cross-sectional imaging.  Chest x-ray 02/10/2024: IMPRESSION: 1. Persistent confluent right basilar opacity corresponding to airspace disease and small pleural effusion on 02/06/2024 CT. 2. No new cardiopulmonary abnormality.  CT adrenal abdomen with and without contrast 02/10/2024: IMPRESSION: 1. 10 mm right adrenal nodule with imaging features compatible with benign adrenal adenoma. 2. No overt left adrenal mass. Coronal imaging shows that the axial findings represent nodular thickening of the left adrenal gland with absolute washout of 57%  and relative washout of 27%, indeterminate. This finding has been stable on axial imaging comparing back to chest CT of 09/20/2020 and is most likely benign, potentially lipid poor adenoma. Follow-up CT imaging in 6-12 months could be used to ensure stability as clinically warranted. 3. 17 mm cystic lesion mid pancreas with 12 mm cystic lesion pancreatic tail. Findings may reflect pseudocysts or side branch IPMN. Follow-up MRI in 6 months recommended to ensure stability. 4. Bibasilar collapse/consolidation with small bilateral pleural effusions. 5. Cholelithiasis versus tumefactive sludge in the gallbladder. 6.  Aortic Atherosclerosis (ICD10-I70.0).  Discharge Medications:  Allergies as of 02/12/2024   No Known Allergies      Medication List     PAUSE taking these medications    Jardiance  25 MG Tabs tablet Wait to take this until your doctor or other care provider tells you to start again. Generic drug: empagliflozin  TAKE 1 TABLET(25 MG) BY MOUTH DAILY   potassium chloride  SA 20 MEQ tablet Wait to take this until your doctor or other care provider tells you to start again. Commonly known as: KLOR-CON  M TAKE 2 TABLETS(40 MEQ) BY MOUTH DAILY       TAKE these medications    dextromethorphan-guaiFENesin  30-600 MG 12hr tablet Commonly known as: MUCINEX  DM Take 1 tablet by mouth daily as needed for cough. What changed:  when to take this reasons to take this   Eliquis  5 MG Tabs tablet Generic drug: apixaban  TAKE 1 TABLET(5  MG) BY MOUTH TWICE DAILY   FreeStyle Libre 3 Sensor Misc PLACE 1 SENSOR ONTO THE SKIN EVERY 14 DAYS   Lancet Device Misc 1 each by Does not apply route 3 (three) times daily. May dispense any manufacturer covered by patient's insurance.   metFORMIN  500 MG 24 hr tablet Commonly known as: GLUCOPHAGE -XR TAKE 1 TABLET(500 MG) BY MOUTH DAILY WITH BREAKFAST   rosuvastatin  20 MG tablet Commonly known as: CRESTOR  Take 1 tablet (20 mg total) by mouth  daily.        Discharge Instructions: Please refer to Patient Instructions section of EMR for full details.  Patient was counseled important signs and symptoms that should prompt return to medical care, changes in medications, dietary instructions, activity restrictions, and follow up appointments.   Follow-Up Appointments:   Lupie Credit, DO 02/12/2024, 10:46 AM PGY-1, [facility]  I have reviewed the above note, agree with its content, and have made the appropriate changes.   Damien Pinal, DO [facility], PGY-3 02/12/24 12:58 PM

## 2024-02-18 ENCOUNTER — Ambulatory Visit: Attending: Cardiovascular Disease | Admitting: Cardiovascular Disease

## 2024-02-18 ENCOUNTER — Encounter: Payer: Self-pay | Admitting: Cardiovascular Disease

## 2024-02-18 ENCOUNTER — Ambulatory Visit: Admitting: Cardiovascular Disease

## 2024-02-18 VITALS — BP 108/68 | HR 90 | Ht 65.0 in | Wt 139.3 lb

## 2024-02-18 DIAGNOSIS — E7800 Pure hypercholesterolemia, unspecified: Secondary | ICD-10-CM

## 2024-02-18 DIAGNOSIS — I5022 Chronic systolic (congestive) heart failure: Secondary | ICD-10-CM | POA: Diagnosis not present

## 2024-02-18 DIAGNOSIS — I739 Peripheral vascular disease, unspecified: Secondary | ICD-10-CM | POA: Insufficient documentation

## 2024-02-18 DIAGNOSIS — I483 Typical atrial flutter: Secondary | ICD-10-CM

## 2024-02-18 DIAGNOSIS — I4892 Unspecified atrial flutter: Secondary | ICD-10-CM | POA: Insufficient documentation

## 2024-02-18 DIAGNOSIS — I10 Essential (primary) hypertension: Secondary | ICD-10-CM | POA: Diagnosis not present

## 2024-02-18 NOTE — Assessment & Plan Note (Signed)
 History of LV dysfunction in the past with echo performed 09/21/2020 revealed an EF of less than 20%.  His most recent echo performed 02/07/2024 revealed EF of 25 to 30%.  This was performed in the setting of aspiration pneumonia.  He is currently not on GDMT.  He is nonambulatory and really has no symptoms of heart failure.  Given his general medical condition, history of childhood cerebral palsy, generalized contracture site status post right AKA, I do not think further evaluation of this is required.

## 2024-02-18 NOTE — Progress Notes (Signed)
 02/18/2024 Rick Schwartz   11/26/1940  [id number]  Primary Physician Romelle Booty, MD Primary Cardiologist: Dorn JINNY Lesches MD FACP, [place], [place], [place]  HPI:  Rick Schwartz is a 83 y.o.  thin, frail and chronically ill appearing single African-American male with cerebral palsy accompanied by his sister Rick Schwartz today who he lives with. He was referred by Dr. Gershon, his podiatrist, for peripheral vascular evaluation because of a right second toe ulcer. I saw him in the office 05/06/15.His cardiovascular risk factor profile is notable for remote tobacco abuse and treated hypertension and hyperlipidemia. The patient lives alone and is currently wheelchair-bound. He had the onset of right second toe discomfort several weeks ago. Is somewhat discolored but I cannot appreciate skin breakdown in his intertriginous zone. He had arterial Dopplers performed in our office 05/04/15 revealing a right ABI 0.7 to the left upper and 67. He had a high-frequency signal in his right common iliac artery, occluded right SFA with 1 vessel runoff via the posterior tibial. At this point, I cannot appreciate an open wound/critical limb ischemia although there is some discoloration. I suspect revascularization would be quite involved with the most obvious. Being revascularization of his high-grade right common iliac artery. I do not think he is a candidate for femoropopliteal bypass grafting.   He was admitted in 2022 with respiratory failure and was found to have a pulmonary embolus.  At that time he did have severe LV dysfunction as well and was evaluated by our service.  The etiology of his LV dysfunction was never evaluated but given his severe PAD it is assumed that it is ischemically mediated.  He was really not started on any GDMT.  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  He was recently admitted 11/20 for 6 days for aspiration pneumonia.  His EF at that time was 25 to 30%.  He lives with his sister  Rick Schwartz.  Lives a bed to wheelchair existence and is minimally conversant and nonambulatory.  He is in a flutter today but already on Eliquis  because of his prior pulmonary embolism.  I do not think further cardiovascular evaluation is warranted at this time.   Current Meds  Medication Sig   Continuous Glucose Sensor (FREESTYLE LIBRE 3 SENSOR) MISC PLACE 1 SENSOR ONTO THE SKIN EVERY 14 DAYS   dextromethorphan-guaiFENesin  (MUCINEX  DM) 30-600 MG 12hr tablet Take 1 tablet by mouth daily as needed for cough.   ELIQUIS  5 MG TABS tablet TAKE 1 TABLET(5 MG) BY MOUTH TWICE DAILY   Lancet Device MISC 1 each by Does not apply route 3 (three) times daily. May dispense any manufacturer covered by patient's insurance.   metFORMIN  (GLUCOPHAGE -XR) 500 MG 24 hr tablet TAKE 1 TABLET(500 MG) BY MOUTH DAILY WITH BREAKFAST   potassium chloride  SA (KLOR-CON  M) 20 MEQ tablet TAKE 2 TABLETS(40 MEQ) BY MOUTH DAILY   rosuvastatin  (CRESTOR ) 20 MG tablet Take 1 tablet (20 mg total) by mouth daily.     No Known Allergies  Social History   Socioeconomic History   Marital status: Single    Spouse name: Not on file   Number of children: Not on file   Years of education: Not on file   Highest education level: Not on file  Occupational History   Not on file  Tobacco Use   Smoking status: Former    Current packs/day: 0.00    Average packs/day: 0.3 packs/day for 35.0 years (8.8 ttl pk-yrs)    Types: Cigarettes  Start date: 10/12/1959    Quit date: 10/12/1994    Years since quitting: 29.3   Smokeless tobacco: Never  Vaping Use   Vaping status: Never Used  Substance and Sexual Activity   Alcohol use: Yes    Comment: Occasional beer and gin   Drug use: No   Sexual activity: Never    Birth control/protection: None  Other Topics Concern   Not on file  Social History Narrative   Not on file   Social Drivers of Health   Financial Resource Strain: Low Risk  (09/24/2022)   Overall Financial Resource Strain  (CARDIA)    Difficulty of Paying Living Expenses: Not hard at all  Food Insecurity: No Food Insecurity (01/22/2023)   Hunger Vital Sign    Worried About Running Out of Food in the Last Year: Never true    Ran Out of Food in the Last Year: Never true  Transportation Needs: No Transportation Needs (01/22/2023)   PRAPARE - Administrator, Civil Service (Medical): No    Lack of Transportation (Non-Medical): No  Physical Activity: Inactive (09/24/2022)   Exercise Vital Sign    Days of Exercise per Week: 0 days    Minutes of Exercise per Session: 0 min  Stress: No Stress Concern Present (09/24/2022)   [employer] of Occupational Health - Occupational Stress Questionnaire    Feeling of Stress : Not at all  Social Connections: Socially Isolated (09/24/2022)   Social Connection and Isolation Panel    Frequency of Communication with Friends and Family: More than three times a week    Frequency of Social Gatherings with Friends and Family: Three times a week    Attends Religious Services: Never    Active Member of Clubs or Organizations: No    Attends Banker Meetings: Never    Marital Status: Never married  Intimate Partner Violence: Not At Risk (01/22/2023)   Humiliation, Afraid, Rape, and Kick questionnaire    Fear of Current or Ex-Partner: No    Emotionally Abused: No    Physically Abused: No    Sexually Abused: No     Review of Systems: General: negative for chills, fever, night sweats or weight changes.  Cardiovascular: negative for chest pain, dyspnea on exertion, edema, orthopnea, palpitations, paroxysmal nocturnal dyspnea or shortness of breath Dermatological: negative for rash Respiratory: negative for cough or wheezing Urologic: negative for hematuria Abdominal: negative for nausea, vomiting, diarrhea, bright red blood per rectum, melena, or hematemesis Neurologic: negative for visual changes, syncope, or dizziness All other systems reviewed and are  otherwise negative except as noted above.    Blood pressure 108/68, pulse 90, height 5' 5 (1.651 m), weight 139 lb 5.3 oz (63.2 kg), SpO2 93%.  General appearance: alert, no distress, and slowed mentation Neck: no adenopathy, no carotid bruit, no JVD, supple, symmetrical, trachea midline, and thyroid not enlarged, symmetric, no tenderness/mass/nodules Lungs: clear to auscultation bilaterally Heart: irregularly irregular rhythm Extremities: Status post right AKA Pulses: Absent Skin: Skin color, texture, turgor normal. No rashes or lesions Neurologic: Grossly normal  EKG EKG Interpretation Date/Time:  Tuesday February 18 2024 09:35:33 EST Ventricular Rate:  90 PR Interval:    QRS Duration:  74 QT Interval:  290 QTC Calculation: 354 R Axis:   48  Text Interpretation: Atrial flutter with variable A-V block with premature ventricular or aberrantly conducted complexes Low voltage QRS Nonspecific T wave abnormality When compared with ECG of 06-Feb-2024 12:37, PREVIOUS ECG IS PRESENT Confirmed by  Court Carrier [phone]) on 02/18/2024 9:40:10 AM    ASSESSMENT AND PLAN:   HYPERCHOLESTEROLEMIA History of hyperlipidemia on statin therapy with lipid profile performed 02/09/2024 revealing total cholesterol 102, LDL 43 and HDL 42.  Right pulmonary embolus History of acute pulmonary embolism in the past in 2022 on Eliquis  oral anticoagulation.  Chronic HFrEF (heart failure with reduced ejection fraction) (HCC) History of LV dysfunction in the past with echo performed 09/21/2020 revealed an EF of less than 20%.  His most recent echo performed 02/07/2024 revealed EF of 25 to 30%.  This was performed in the setting of aspiration pneumonia.  He is currently not on GDMT.  He is nonambulatory and really has no symptoms of heart failure.  Given his general medical condition, history of childhood cerebral palsy, generalized contracture site status post right AKA, I do not think further evaluation of this is  required.  Peripheral arterial disease Patient last saw me in the office at the request of Dr. Gershon 08/05/2015.  At that time he did have Dopplers performed 05/04/2015 revealing a right ABI of 0.7 and a left of the 0.67.  He did not have critical limb ischemia at that time however he apparently went underwent right AKA in 2021.  He is essentially wheelchair-bound and nonambulatory.  Atrial flutter (HCC) EKG shows atrial flutter today with a ventricular sponsor of 90.  He is already on Eliquis .  No further evaluation is warranted at this time.     Carrier DOROTHA Court MD FACP,FACC,FAHA, [facility] 02/18/2024 10:01 AM

## 2024-02-18 NOTE — Assessment & Plan Note (Signed)
 History of acute pulmonary embolism in the past in 2022 on Eliquis  oral anticoagulation.

## 2024-02-18 NOTE — Patient Instructions (Signed)
 Medication Instructions:  Your physician recommends that you continue on your current medications as directed. Please refer to the Current Medication list given to you today.  *If you need a refill on your cardiac medications before your next appointment, please call your pharmacy*   Follow-Up: At [facility], you and your health needs are our priority.  As part of our continuing mission to provide you with exceptional heart care, our providers are all part of one team.  This team includes your primary Cardiologist (physician) and Advanced Practice Providers or APPs (Physician Assistants and Nurse Practitioners) who all work together to provide you with the care you need, when you need it.  Your next appointment:   We will see you on an as needed basis.  Provider:   Lauro Portal, MD   We recommend signing up for the patient portal called "MyChart".  Sign up information is provided on this After Visit Summary.  MyChart is used to connect with patients for Virtual Visits (Telemedicine).  Patients are able to view lab/test results, encounter notes, upcoming appointments, etc.  Non-urgent messages can be sent to your provider as well.   To learn more about what you can do with MyChart, go to [email or web address].

## 2024-02-18 NOTE — Assessment & Plan Note (Signed)
 Patient last saw me in the office at the request of Dr. Gershon 08/05/2015.  At that time he did have Dopplers performed 05/04/2015 revealing a right ABI of 0.7 and a left of the 0.67.  He did not have critical limb ischemia at that time however he apparently went underwent right AKA in 2021.  He is essentially wheelchair-bound and nonambulatory.

## 2024-02-18 NOTE — Assessment & Plan Note (Signed)
 EKG shows atrial flutter today with a ventricular sponsor of 90.  He is already on Eliquis .  No further evaluation is warranted at this time.

## 2024-02-18 NOTE — Assessment & Plan Note (Signed)
 History of hyperlipidemia on statin therapy with lipid profile performed 02/09/2024 revealing total cholesterol 102, LDL 43 and HDL 42.

## 2024-02-25 NOTE — Progress Notes (Unsigned)
    SUBJECTIVE:   CHIEF COMPLAINT / HPI:   HFU Patient was admitted 11/20-11/26 for likely aspiration pneumonia requiring BiPAP and IV abx He was cleared for regular diet with upright seating upon discharge  Saw Dr. Court with cardiology in the interim, no further eval/treatment of his HFrEF or aflutter recommended   Per DC summary: PCP follow-up: Repeat BMP for kidney function  Restart Jardiance  once kidney function improved  Potassium supplementation held at discharge, consider restarting as indicated 17 mm cystic lesion mid pancreas; Follow-up MRI in 6 months recommended to ensure stability if aligning with goals of care Follow-up CT imaging of adrenal incidentaloma in 6-12 months if aligning with goals of care  Outpatient cardiology follow up for reduced EF from prior study ***  PERTINENT  PMH / PSH: ***HLD, PE and Aflutter on Eliquis , HFrEF  OBJECTIVE:   There were no vitals taken for this visit.  ***  ASSESSMENT/PLAN:   Assessment & Plan History of aspiration pneumonia Stable Tolerating reg diet, discussed proper positioning Checking BMP today. Ok to continue potassium supplementation for now as he has required this for a long time. F/u potassium levels on BMP. Restarted Jardiance  Adrenal adenoma, unspecified laterality Needs repeat CT Adrenal in May-November of 2026 per imaging report from the hospital. Likely benign adenoma. Lesion of pancreas Needs follow up MRI around May 2026 for cystic lesion noted on pancreas on CT.    Rick Coward, MD [facility]

## 2024-02-25 NOTE — Patient Instructions (Incomplete)
 Continue current medications. You may restart Jardiance .  If any of your results from today are abnormal and/or require changes to your medical care, I will give you a call. Otherwise, I will send you a letter in the mail or a message on MyChart.

## 2024-02-26 ENCOUNTER — Encounter: Payer: Self-pay | Admitting: Family Medicine

## 2024-02-26 ENCOUNTER — Ambulatory Visit: Admitting: Family Medicine

## 2024-02-26 ENCOUNTER — Telehealth: Payer: Self-pay

## 2024-02-26 VITALS — BP 147/69 | HR 74 | Ht 65.0 in

## 2024-02-26 DIAGNOSIS — D3500 Benign neoplasm of unspecified adrenal gland: Secondary | ICD-10-CM | POA: Diagnosis not present

## 2024-02-26 DIAGNOSIS — Z23 Encounter for immunization: Secondary | ICD-10-CM | POA: Diagnosis not present

## 2024-02-26 DIAGNOSIS — K869 Disease of pancreas, unspecified: Secondary | ICD-10-CM | POA: Diagnosis not present

## 2024-02-26 DIAGNOSIS — Z8701 Personal history of pneumonia (recurrent): Secondary | ICD-10-CM

## 2024-02-26 DIAGNOSIS — E876 Hypokalemia: Secondary | ICD-10-CM | POA: Diagnosis not present

## 2024-02-26 MED ORDER — ALBUTEROL SULFATE (2.5 MG/3ML) 0.083% IN NEBU: 2.5000 mg | INHALATION_SOLUTION | Freq: Four times a day (QID) | RESPIRATORY_TRACT | 1 refills | Status: AC | PRN

## 2024-02-26 NOTE — Assessment & Plan Note (Addendum)
 Stable Tolerating reg diet, discussed proper positioning Checking BMP today. Ok to continue potassium supplementation for now as he has required this for a long time. F/u potassium levels on BMP. Restarted Jardiance , f/u renal function Added PRN nebulizer per sister's request as this helped him a lot in the hospital. No hx asthma/COPD

## 2024-02-26 NOTE — Telephone Encounter (Signed)
 Receipt confirmed by [employer].   Chiquita JAYSON English, RN

## 2024-02-26 NOTE — Telephone Encounter (Signed)
 Community message sent to [employer] for nebulizer.   Chiquita JAYSON English, RN

## 2024-02-27 ENCOUNTER — Ambulatory Visit: Payer: Self-pay | Admitting: Family Medicine

## 2024-02-27 LAB — BASIC METABOLIC PANEL WITH GFR
BUN/Creatinine Ratio: 8 — ABNORMAL LOW (ref 10–24)
BUN: 6 mg/dL — ABNORMAL LOW (ref 8–27)
CO2: 20 mmol/L (ref 20–29)
Calcium: 8.9 mg/dL (ref 8.6–10.2)
Chloride: 103 mmol/L (ref 96–106)
Creatinine, Ser: 0.75 mg/dL — ABNORMAL LOW (ref 0.76–1.27)
Glucose: 120 mg/dL — ABNORMAL HIGH (ref 70–99)
Potassium: 3.5 mmol/L (ref 3.5–5.2)
Sodium: 143 mmol/L (ref 134–144)
eGFR: 90 mL/min/1.73 (ref >59)

## 2024-03-04 ENCOUNTER — Other Ambulatory Visit: Payer: Self-pay | Admitting: Family Medicine

## 2024-03-24 ENCOUNTER — Telehealth: Payer: Self-pay

## 2024-03-24 NOTE — Telephone Encounter (Signed)
 Fax from [employer] [address] requesting LANTUS  SOLLSTAR PEN INJ . I didn't see this on current medication list. If you want patient to start this medication. Please send Rx to pharmacy with instructions, duration and dose. Nelson Land, CMA

## 2024-03-25 ENCOUNTER — Other Ambulatory Visit: Payer: Self-pay | Admitting: Family Medicine

## 2024-03-26 NOTE — Telephone Encounter (Signed)
 Spoke with patients sister and she stated that patient is not taking Lantus . Nelson Land, CMA

## 2024-04-08 ENCOUNTER — Other Ambulatory Visit: Payer: Self-pay | Admitting: Family Medicine

## 2024-04-08 DIAGNOSIS — E7800 Pure hypercholesterolemia, unspecified: Secondary | ICD-10-CM

## 2024-04-09 ENCOUNTER — Ambulatory Visit: Admitting: Family Medicine

## 2024-04-09 VITALS — BP 156/90 | HR 93 | Ht 65.0 in | Wt 136.7 lb

## 2024-04-09 DIAGNOSIS — E213 Hyperparathyroidism, unspecified: Secondary | ICD-10-CM

## 2024-04-09 DIAGNOSIS — E1122 Type 2 diabetes mellitus with diabetic chronic kidney disease: Secondary | ICD-10-CM

## 2024-04-09 DIAGNOSIS — R1312 Dysphagia, oropharyngeal phase: Secondary | ICD-10-CM | POA: Diagnosis not present

## 2024-04-09 DIAGNOSIS — M2062 Acquired deformities of toe(s), unspecified, left foot: Secondary | ICD-10-CM

## 2024-04-09 DIAGNOSIS — N182 Chronic kidney disease, stage 2 (mild): Secondary | ICD-10-CM | POA: Diagnosis not present

## 2024-04-09 DIAGNOSIS — Z89511 Acquired absence of right leg below knee: Secondary | ICD-10-CM

## 2024-04-09 DIAGNOSIS — I483 Typical atrial flutter: Secondary | ICD-10-CM

## 2024-04-09 DIAGNOSIS — Z794 Long term (current) use of insulin: Secondary | ICD-10-CM

## 2024-04-09 DIAGNOSIS — R7989 Other specified abnormal findings of blood chemistry: Secondary | ICD-10-CM

## 2024-04-09 DIAGNOSIS — Z993 Dependence on wheelchair: Secondary | ICD-10-CM

## 2024-04-09 DIAGNOSIS — L602 Onychogryphosis: Secondary | ICD-10-CM

## 2024-04-09 DIAGNOSIS — E1159 Type 2 diabetes mellitus with other circulatory complications: Secondary | ICD-10-CM

## 2024-04-09 DIAGNOSIS — I152 Hypertension secondary to endocrine disorders: Secondary | ICD-10-CM | POA: Diagnosis not present

## 2024-04-09 DIAGNOSIS — E878 Other disorders of electrolyte and fluid balance, not elsewhere classified: Secondary | ICD-10-CM

## 2024-04-09 DIAGNOSIS — I739 Peripheral vascular disease, unspecified: Secondary | ICD-10-CM

## 2024-04-09 MED ORDER — APIXABAN 2.5 MG PO TABS: 2.5000 mg | ORAL_TABLET | Freq: Two times a day (BID) | ORAL | 3 refills | Status: AC

## 2024-04-09 MED ORDER — DM-GUAIFENESIN ER 30-600 MG PO TB12: 1.0000 | ORAL_TABLET | Freq: Every day | ORAL | Status: AC | PRN

## 2024-04-09 NOTE — Assessment & Plan Note (Signed)
 On Eliquis  5 mg twice daily, can be dose reduced given age and weight.  Rate controlled today. -Upon next refill transition to Eliquis  2.5 mg twice daily

## 2024-04-09 NOTE — Progress Notes (Addendum)
" ° ° °  SUBJECTIVE:   CHIEF COMPLAINT / HPI:   T2DM -Off Lantus , does well with CGM. Sister will give him 3 units of Lantus  if his blood sugar is really high.  Happens rarely, last time was around 298 -Taking Jardiance  and Metformin  -Last A1c 6.3, at goal - Sister feels like his weight has been stable and thinks he is getting heavy  Social: -Eating 2x per day or pureed foods + snack (Ensure) -Accompanied by sister Pam (primary caregiver) -Holley and her husband provide 24/7 care -Has other sisters but they are unable to provide care -Total assist with all ADLs -Cutting his nails at home with some difficulty.  Last went to podiatry about 2 years ago but reports he was charged around $250 for nail clipping -Meds reviewed and updated -Due for a new wheelchair in 07/2024  PERTINENT  PMH / PSH: HTN, T2DM, HFrEF, aflutter, PAD, right BKA, BPH, quadriplegic cerebral palsy  OBJECTIVE:   BP (!) 156/90   Pulse 93   Ht 5' 5 (1.651 m)   Wt 136 lb 11.2 oz (62 kg) Comment: NON AMBULATORY  SpO2 99%   BMI 22.75 kg/m    General: NAD, chronically ill appearing HEENT: TM with cerumen impaction bilaterally. Does not fully closes right side of mouth with drooling noted Cardiac: RRR, no murmurs. Respiratory: Mildly laborious breathing. No wheezes, rales or rhonchi Abdomen: Bowel sounds present, nontender, nondistended Extremities: Right BKA. Left foot with long, hypertrophic toenails, great toe cutting into 2nd digit. Skin: warm and dry. Neuro: Wheelchair bound. Right sided contracture of upper extremity. Psych: Mostly non-verbal. Intermittent groan in response to prompting.  ASSESSMENT/PLAN:   Assessment & Plan Wheelchair dependent Hx of right BKA (HCC) Total assist with ADLs by sister and brother-in-law as primary caregivers.  Per sister reportedly due for a new wheelchair in 07/2024, discussed referral to neurorehab for proper wheelchair fitting and sister is agreeable. -Recommend referral  to Neuro Rehab around 07/2024 for fitting of new wheelchair Typical atrial flutter (HCC) On Eliquis  5 mg twice daily, can be dose reduced given age and weight.  Rate controlled today. -Upon next refill transition to Eliquis  2.5 mg twice daily Oropharyngeal dysphagia Weight trend stable.  Doing well on pured diet.  Continue to utilize Mucinex  as needed. Electrolyte abnormality On Kcl 40 meq daily, will obtain blood work to monitor potassium and magnesium . -RFP, mag Hyperparathyroidism Noted on blood work in 01/2024, will obtain follow-up repeat testing. -RFP, PTH, vitamin D Type 2 diabetes mellitus with stage 2 chronic kidney disease, with long-term current use of insulin  (HCC) Last A1c 6.3, at goal for age.  Continue on current regimen of Jardiance  and metformin , continue off insulin . Peripheral arterial disease Long dystrophic toenails on left foot, caregiver has significant difficulty with toenail care. -Referred to podiatry for diabetic footcare in the setting of PAD Hypertension associated with diabetes (HCC) BP 156/90, close to goal and given other chronic health comorbidities will continue on current medications.   Dr. Izetta Nap, DO [facility]     "

## 2024-04-09 NOTE — Assessment & Plan Note (Signed)
 Weight trend stable.  Doing well on pured diet.  Continue to utilize Mucinex  as needed.

## 2024-04-09 NOTE — Assessment & Plan Note (Signed)
 Long dystrophic toenails on left foot, caregiver has significant difficulty with toenail care. -Referred to podiatry for diabetic footcare in the setting of PAD

## 2024-04-09 NOTE — Assessment & Plan Note (Signed)
 Total assist with ADLs by sister and brother-in-law as primary caregivers.  Per sister reportedly due for a new wheelchair in 07/2024, discussed referral to neurorehab for proper wheelchair fitting and sister is agreeable. -Recommend referral to Neuro Rehab around 07/2024 for fitting of new wheelchair

## 2024-04-09 NOTE — Assessment & Plan Note (Signed)
 BP 156/90, close to goal and given other chronic health comorbidities will continue on current medications.

## 2024-04-09 NOTE — Patient Instructions (Addendum)
 It was wonderful to see you today! Thank you for choosing [facility].   Please bring ALL of your medications with you to every visit.   Today we talked about:  We are getting some blood work today, if any changes need to be made to his medications we will call you and you will see those results on MyChart as well. We have put a referral to podiatry to help care for his nails, our office will help follow-up to get him scheduled.  Please try to keep his nails trimmed at home if possible prevent them from cutting into his nearby toes. Whenever he runs out of the Eliquis  5 mg tablets we would like to lower the dose of his medication.  After that time please start giving him Eliquis  2.5 mg twice per day.  Please follow up in 3 months   We are checking some labs today. If they are abnormal, I will call you. If they are normal, I will send you a MyChart message (if it is active) or a letter in the mail. If you do not hear about your labs in the next 2 weeks, please call the office.  Call the clinic at [phone] if your symptoms worsen or you have any concerns.  Please be sure to schedule follow up at the front desk before you leave today.   Izetta Nap, DO Family Medicine

## 2024-04-09 NOTE — Assessment & Plan Note (Signed)
 Last A1c 6.3, at goal for age.  Continue on current regimen of Jardiance  and metformin , continue off insulin .

## 2024-04-10 ENCOUNTER — Encounter: Payer: Self-pay | Admitting: Family Medicine

## 2024-04-10 DIAGNOSIS — L602 Onychogryphosis: Secondary | ICD-10-CM | POA: Insufficient documentation

## 2024-04-10 LAB — RENAL FUNCTION PANEL
Albumin: 3.5 g/dL — ABNORMAL LOW (ref 3.7–4.7)
BUN/Creatinine Ratio: 10 (ref 10–24)
BUN: 9 mg/dL (ref 8–27)
CO2: 19 mmol/L — ABNORMAL LOW (ref 20–29)
Calcium: 9.6 mg/dL (ref 8.6–10.2)
Chloride: 107 mmol/L — ABNORMAL HIGH (ref 96–106)
Creatinine, Ser: 0.9 mg/dL (ref 0.76–1.27)
Glucose: 123 mg/dL — ABNORMAL HIGH (ref 70–99)
Phosphorus: 2.1 mg/dL — ABNORMAL LOW (ref 2.8–4.1)
Potassium: 4.3 mmol/L (ref 3.5–5.2)
Sodium: 144 mmol/L (ref 134–144)
eGFR: 85 mL/min/1.73

## 2024-04-10 LAB — PTH, INTACT AND CALCIUM: PTH: 55 pg/mL (ref 15–65)

## 2024-04-10 LAB — VITAMIN D 25 HYDROXY (VIT D DEFICIENCY, FRACTURES): Vit D, 25-Hydroxy: 10.3 ng/mL — ABNORMAL LOW (ref 30.0–100.0)

## 2024-04-10 LAB — MAGNESIUM: Magnesium: 2.2 mg/dL (ref 1.6–2.3)

## 2024-04-10 NOTE — Addendum Note (Signed)
 Addended by: KENETH BOAS D on: 04/10/2024 09:36 AM   Modules accepted: Level of Service

## 2024-04-10 NOTE — Progress Notes (Addendum)
 Patient ID: Rick Schwartz, male   DOB: June 10, 1940, 84 y.o.   MRN: [id number] I discussed this patient with the resident physician. I have reviewed resident's note and I agree with their findings and treatment plan.

## 2024-05-04 ENCOUNTER — Ambulatory Visit: Admitting: Podiatry
